# Patient Record
Sex: Female | Born: 1965 | Race: Black or African American | Hispanic: No | Smoking: Never smoker
Health system: Southern US, Community
[De-identification: ages and names within clinical notes are randomized; demographics above are authoritative.]

## PROBLEM LIST (undated history)

## (undated) DIAGNOSIS — I471 Supraventricular tachycardia, unspecified: Secondary | ICD-10-CM

## (undated) DIAGNOSIS — I10 Essential (primary) hypertension: Secondary | ICD-10-CM

## (undated) DIAGNOSIS — I639 Cerebral infarction, unspecified: Secondary | ICD-10-CM

## (undated) DIAGNOSIS — N186 End stage renal disease: Secondary | ICD-10-CM

## (undated) DIAGNOSIS — N189 Chronic kidney disease, unspecified: Secondary | ICD-10-CM

---

## 1998-11-06 ENCOUNTER — Emergency Department (HOSPITAL_COMMUNITY): Admission: EM | Admit: 1998-11-06 | Discharge: 1998-11-06 | Payer: Self-pay | Admitting: Emergency Medicine

## 1998-11-06 ENCOUNTER — Encounter: Payer: Self-pay | Admitting: Emergency Medicine

## 2012-09-22 ENCOUNTER — Observation Stay (HOSPITAL_COMMUNITY)
Admission: EM | Admit: 2012-09-22 | Discharge: 2012-09-23 | Disposition: A | Discharge status: Home / Self Care | Payer: No Typology Code available for payment source | Attending: Internal Medicine | Admitting: Internal Medicine

## 2012-09-22 ENCOUNTER — Encounter (HOSPITAL_COMMUNITY): Payer: Self-pay | Admitting: *Deleted

## 2012-09-22 ENCOUNTER — Emergency Department (HOSPITAL_COMMUNITY): Payer: No Typology Code available for payment source

## 2012-09-22 DIAGNOSIS — Z79899 Other long term (current) drug therapy: Secondary | ICD-10-CM | POA: Insufficient documentation

## 2012-09-22 DIAGNOSIS — R42 Dizziness and giddiness: Secondary | ICD-10-CM | POA: Insufficient documentation

## 2012-09-22 DIAGNOSIS — I160 Hypertensive urgency: Secondary | ICD-10-CM | POA: Diagnosis present

## 2012-09-22 DIAGNOSIS — F411 Generalized anxiety disorder: Secondary | ICD-10-CM | POA: Insufficient documentation

## 2012-09-22 DIAGNOSIS — R51 Headache: Secondary | ICD-10-CM | POA: Insufficient documentation

## 2012-09-22 DIAGNOSIS — F419 Anxiety disorder, unspecified: Secondary | ICD-10-CM

## 2012-09-22 DIAGNOSIS — I10 Essential (primary) hypertension: Principal | ICD-10-CM | POA: Insufficient documentation

## 2012-09-22 HISTORY — DX: Essential (primary) hypertension: I10

## 2012-09-22 LAB — POCT I-STAT, CHEM 8
BUN: 17 mg/dL (ref 6–23)
Calcium, Ion: 1.16 mmol/L (ref 1.12–1.23)
Chloride: 105 mEq/L (ref 96–112)
Creatinine, Ser: 1 mg/dL (ref 0.50–1.10)
Glucose, Bld: 113 mg/dL — ABNORMAL HIGH (ref 70–99)
HCT: 38 % (ref 36.0–46.0)
Hemoglobin: 12.9 g/dL (ref 12.0–15.0)
Potassium: 3.5 mEq/L (ref 3.5–5.1)
Sodium: 142 mEq/L (ref 135–145)
TCO2: 27 mmol/L (ref 0–100)

## 2012-09-22 MED ORDER — SODIUM CHLORIDE 0.9 % IJ SOLN
3.0000 mL | Freq: Two times a day (BID) | INTRAMUSCULAR | Status: DC
  Administered 2012-09-23: 3 mL via INTRAVENOUS

## 2012-09-22 MED ORDER — LABETALOL HCL 5 MG/ML IV SOLN
20.0000 mg | Freq: Once | INTRAVENOUS | Status: AC
  Administered 2012-09-22: 20 mg via INTRAVENOUS
  Filled 2012-09-22: qty 4

## 2012-09-22 MED ORDER — AMLODIPINE BESYLATE 5 MG PO TABS
5.0000 mg | ORAL_TABLET | Freq: Every day | ORAL | Status: AC
  Administered 2012-09-22: 5 mg via ORAL
  Filled 2012-09-22: qty 1

## 2012-09-22 MED ORDER — ACETAMINOPHEN 325 MG PO TABS
650.0000 mg | ORAL_TABLET | Freq: Four times a day (QID) | ORAL | Status: DC | PRN
  Administered 2012-09-22: 650 mg via ORAL
  Filled 2012-09-22: qty 2

## 2012-09-22 MED ORDER — SODIUM CHLORIDE 0.9 % IV SOLN: 250.0000 mL | INTRAVENOUS | Status: DC | PRN

## 2012-09-22 MED ORDER — CLONIDINE HCL 0.1 MG PO TABS: 0.1000 mg | ORAL_TABLET | Freq: Once | ORAL | Status: DC

## 2012-09-22 MED ORDER — IRBESARTAN 150 MG PO TABS
150.0000 mg | ORAL_TABLET | Freq: Every day | ORAL | Status: DC
  Administered 2012-09-23: 150 mg via ORAL
  Filled 2012-09-22: qty 1

## 2012-09-22 MED ORDER — HYDRALAZINE HCL 20 MG/ML IJ SOLN
10.0000 mg | Freq: Four times a day (QID) | INTRAMUSCULAR | Status: DC | PRN
  Administered 2012-09-22: 10 mg via INTRAVENOUS
  Filled 2012-09-22 (×2): qty 1

## 2012-09-22 MED ORDER — ONDANSETRON HCL 4 MG PO TABS: 4.0000 mg | ORAL_TABLET | Freq: Four times a day (QID) | ORAL | Status: DC | PRN

## 2012-09-22 MED ORDER — ACETAMINOPHEN 650 MG RE SUPP: 650.0000 mg | Freq: Four times a day (QID) | RECTAL | Status: DC | PRN

## 2012-09-22 MED ORDER — ALPRAZOLAM 0.25 MG PO TABS
0.2500 mg | ORAL_TABLET | Freq: Three times a day (TID) | ORAL | Status: DC | PRN
  Administered 2012-09-23: 0.25 mg via ORAL
  Filled 2012-09-22 (×2): qty 1

## 2012-09-22 MED ORDER — HYDROCHLOROTHIAZIDE 12.5 MG PO CAPS
12.5000 mg | ORAL_CAPSULE | Freq: Every day | ORAL | Status: DC
  Administered 2012-09-22 – 2012-09-23 (×2): 12.5 mg via ORAL
  Filled 2012-09-22 (×2): qty 1

## 2012-09-22 MED ORDER — CLONIDINE HCL 0.1 MG PO TABS
0.2000 mg | ORAL_TABLET | Freq: Once | ORAL | Status: DC
  Filled 2012-09-22: qty 2

## 2012-09-22 MED ORDER — SODIUM CHLORIDE 0.9 % IJ SOLN: 3.0000 mL | INTRAMUSCULAR | Status: DC | PRN

## 2012-09-22 MED ORDER — SODIUM CHLORIDE 0.9 % IJ SOLN
3.0000 mL | Freq: Two times a day (BID) | INTRAMUSCULAR | Status: DC
  Administered 2012-09-22: 3 mL via INTRAVENOUS

## 2012-09-22 MED ORDER — LABETALOL HCL 5 MG/ML IV SOLN
20.0000 mg | Freq: Once | INTRAVENOUS | Status: AC
Start: 2012-09-22 — End: 2012-09-22
  Administered 2012-09-22: 20 mg via INTRAVENOUS
  Filled 2012-09-22: qty 4

## 2012-09-22 MED ORDER — AMLODIPINE BESYLATE 10 MG PO TABS
10.0000 mg | ORAL_TABLET | Freq: Every day | ORAL | Status: DC
  Administered 2012-09-23: 10 mg via ORAL
  Filled 2012-09-22: qty 1

## 2012-09-22 MED ORDER — ONDANSETRON HCL 4 MG/2ML IJ SOLN: 4.0000 mg | Freq: Four times a day (QID) | INTRAMUSCULAR | Status: DC | PRN

## 2012-09-22 MED ORDER — SODIUM CHLORIDE 0.9 % IV SOLN
Freq: Once | INTRAVENOUS | Status: AC
  Administered 2012-09-22: 20 mL/h via INTRAVENOUS

## 2012-09-22 NOTE — Progress Notes (Signed)
   CARE MANAGEMENT ED NOTE 09/22/2012  Patient:  ANCHAL, MAKINO   Account Number:  [id number]  Date Initiated:  09/22/2012  Documentation initiated by:  Livia Snellen  Subjective/Objective Assessment:     Subjective/Objective Assessment Detail:     Action/Plan:   Action/Plan Detail:   Anticipated DC Date:       Status Recommendation to Physician:   Result of Recommendation:    Other ED [place]  Other  PCP issues    Choice offered to / List presented to:            Status of service:  Completed, signed off  ED Comments:   ED Comments Detail:  Patient listed as not having a pcp.  EDCM spoke to patient who stated that Dr. Lelon Frohlich PA at [facility] is her primary doctor.  Offered support to patient.  No further needs at this time.

## 2012-09-22 NOTE — ED Provider Notes (Signed)
Erin Dean is a 47 y.o. female who presents for evaluation of hypertension. She was started on a new agent for blood pressure, 5 days ago. She has taken it daily. Prior to that she was off medication for blood pressure for "a long time." She had previously been on Benicar. She had some dizziness last week, it has not recurred. Today, she noted "black spots in front of my eyes". She denies headache, weakness, dizziness, nausea, vomiting, chest pain, shortness of breath, change in bowel or urinary habits.  Exam alert, calm, cooperative. Neurologic exam grossly nonfocal. Respiratory normal effort.  Vital signs reviewed are normal with the exception of blood pressure elevated to 221/94.  Medications  labetalol (NORMODYNE,TRANDATE) injection 20 mg (not administered)    Patient Vitals for the past 24 hrs:  BP Temp Temp src Pulse Resp SpO2 Height  09/22/12 1134 220/90 mmHg - - - - - -  09/22/12 1131 221/94 mmHg 98.6 F (37 C) Oral 86 11 100 % 5\' 7"  (1.702 m)   Results for orders placed during the hospital encounter of 09/22/12  POCT I-STAT, CHEM 8      Result Value Range   Sodium 142  135 - 145 mEq/L   Potassium 3.5  3.5 - 5.1 mEq/L   Chloride 105  96 - 112 mEq/L   BUN 17  6 - 23 mg/dL   Creatinine, Ser 1.00  0.50 - 1.10 mg/dL   Glucose, Bld 113 (*) 70 - 99 mg/dL   Calcium, Ion 1.16  1.12 - 1.23 mmol/L   TCO2 27  0 - 100 mmol/L   Hemoglobin 12.9  12.0 - 15.0 g/dL   HCT 38.0  36.0 - 46.0 %     CRITICAL CARE Performed by: Daleen Bo L Total critical care time: 35 minutes. Treatment for Hypertensive urgency with IV Beta-blocker Critical care time was exclusive of separately billable procedures and treating other patients. Critical care was necessary to treat or prevent imminent or life-threatening deterioration. Critical care was time spent personally by me on the following activities: development of treatment plan with patient and/or surrogate as well as nursing, discussions with  consultants, evaluation of patient's response to treatment, examination of patient, obtaining history from patient or surrogate, ordering and performing treatments and interventions, ordering and review of laboratory studies, ordering and review of radiographic studies, pulse oximetry and re-evaluation of patient's condition.   Assessment- hypertension with nonspecific visual changes. Evaluation for hypertensive urgency in the ED, done to evaluate for an organ damage. Treatment with IV beta blocker for control of blood pressure.  5:10 PM-Consult complete with Dr. Wendee Beavers. Patient case explained and discussed. He agrees to admit patient for further evaluation and treatment. Call ended at 17:30   Medical screening examination/treatment/procedure(s) were conducted as a shared visit with non-physician practitioner(s) and myself.  I personally evaluated the patient during the encounter  Richarda Blade, MD 09/22/12 2055

## 2012-09-22 NOTE — ED Provider Notes (Signed)
History     CSN: [id number]  Arrival date & time 09/22/12  1114   First MD Initiated Contact with Patient 09/22/12 1140      Chief Complaint  Patient presents with  . Hypertension  . Dizziness    (Consider location/radiation/quality/duration/timing/severity/associated sxs/prior treatment) Patient is a 47 y.o. female presenting with hypertension.  Hypertension Pertinent negatives include no chest pain, chills, fever, headaches, nausea, numbness, vomiting or weakness.   Pt is a 47yo female with hx of HTN presenting today with elevated BP.  Pt was seen by  Lennon Alstrom PA-C, PCP, last Thursday 6/12 for dizziness and was found to have elevated BP, lower 200s/100s. Pt was given clonidine in the office and prescribed Tribenzor.  She reported to PCP today for BP recheck and found to still have elevated BP and was advised to come to ED.  Pt states she feels dizzy on occasion, associated with "black floaters," last time was earlier this morning when speaking with her boss.  States dizziness feels like room is spinning and left ear is "clogged", it last a few hours last Wednesday but only a few minutes today.  Reports also being given xanax but states she has not tried that medication yet.  Pt does report having been dx with HTN in the past however has not been on medication for a while due to lack of insurance. Denies dizziness at this time.  Denies chest pain, SOB, abdominal pain, n/v/d.  Denies hematuria or dysuria.   Past Medical History  Diagnosis Date  . Hypertension     Past Surgical History  Procedure Laterality Date  . Cesarean section      History reviewed. No pertinent family history.  History  Substance Use Topics  . Smoking status: Never Smoker   . Smokeless tobacco: Never Used  . Alcohol Use: No    OB History   Grav Para Term Preterm Abortions TAB SAB Ect Mult Living                  Review of Systems  Constitutional: Negative for fever and chills.  Eyes:  Negative for photophobia and visual disturbance.  Respiratory: Negative for shortness of breath.   Cardiovascular: Negative for chest pain.  Gastrointestinal: Negative for nausea and vomiting.  Genitourinary: Negative for hematuria.  Neurological: Positive for dizziness. Negative for tremors, seizures, syncope, weakness, numbness and headaches.  All other systems reviewed and are negative.    Allergies  Review of patient's allergies indicates no known allergies.  Home Medications   No current outpatient prescriptions on file.  BP 142/84  Pulse 80  Temp(Src) 97.8 F (36.6 C) (Oral)  Resp 16  Ht 5\' 7"  (1.702 m)  Wt 253 lb (114.76 kg)  BMI 39.62 kg/m2  SpO2 97%  LMP 09/15/2012  Physical Exam  Nursing note and vitals reviewed. Constitutional: She appears well-developed and well-nourished. No distress.  Pt appears well, NAD.  HENT:  Head: Normocephalic and atraumatic.  Eyes: Conjunctivae are normal. No scleral icterus.  Neck: Normal range of motion. Neck supple.  Cardiovascular: Normal rate, regular rhythm and normal heart sounds.   Pulmonary/Chest: Effort normal and breath sounds normal. No respiratory distress. She has no wheezes. She has no rales. She exhibits no tenderness.  Abdominal: Soft. Bowel sounds are normal. She exhibits no distension and no mass. There is no tenderness. There is no rebound and no guarding.  Musculoskeletal: Normal range of motion.  Neurological: She is alert.  Skin: Skin is warm and  dry. She is not diaphoretic.    ED Course  Procedures (including critical care time)  Labs Reviewed  BASIC METABOLIC PANEL - Abnormal; Notable for the following:    Glucose, Bld 109 (*)    Creatinine, Ser 1.12 (*)    GFR calc non Af Amer 58 (*)    GFR calc Af Amer 67 (*)    All other components within normal limits  CBC - Abnormal; Notable for the following:    Hemoglobin 9.6 (*)    HCT 31.3 (*)    MCV 76.5 (*)    MCH 23.5 (*)    RDW 16.2 (*)    All  other components within normal limits  POCT I-STAT, CHEM 8 - Abnormal; Notable for the following:    Glucose, Bld 113 (*)    All other components within normal limits   Ct Head Wo Contrast  09/22/2012   *RADIOLOGY REPORT*  Clinical Data: Hypertension, dizziness, severe headache  CT HEAD WITHOUT CONTRAST  Technique:  Contiguous axial images were obtained from the base of the skull through the vertex without contrast.  Comparison: None.  Findings:  Scattered minimal grossly symmetric bilateral basal ganglial calcifications.  Gray white differentiation is maintained. No CT evidence of acute large territory infarct.  No intraparenchymal or extra-axial mass or hemorrhage.  Normal size and configuration of the ventricles and basilar cisterns.  No midline shift.  Limited visualization of paranasal sinuses and mastoid air cells are normally aerated.  No air fluid levels.  Regional soft tissues and osseous structures are normal.  IMPRESSION: Negative noncontrast head CT.   Original Report Authenticated By: Jake Seats, MD      1. Dizziness   2. Hypertensive urgency   3. Anxiety       MDM  Pt appears well. Was sent to ED by PCP after failed outpatient tx of HTN.  Was able to track down medical records of EKG, CXR, CBC, CMP, TSH, and UA: all which were unremarkable.  Will obtain head CT here due to elevated BP and "black spots" in patients vision when dizziness occurs.  Tx with labetalol at this time.    Records from [place] from 09/18/12 EKG: NSR, no arrhythmias, no STEMI CBC: Hgb-10.2, Hct-32.2, MCH-24.1, MCV-76.6, MCHC-31.5, RDW-17.0,  CMP: WNL- BUN-17, Cr-1.22, GFR-77, ALT-8, AST-18 UA: elevated protein, >300, trace ketones TSH: WNL-2.08   Chem 8: unremarkable Head CT: negative  Labetalol: pressure was lowered to 187/89.  Ambulated pt and rechecked pressure-216/110, gave another dose of labetalol.  Pt denies feeling dizzy or weak but stated she was upset that her BP keeps going up.     Consulted Dr. Eulis Foster of continued elevated BP.  Pt is to be admitted for better control of BP.   Noland Fordyce, PA-C 09/23/12 1422

## 2012-09-22 NOTE — H&P (Signed)
[facility] History and Physical  Erin Dean DOB: 1966/02/10 DOA: 09/22/2012  Referring physician: Dr. Eulis Foster PCP: No primary provider on file.  Specialists: none  Chief Complaint: Dots in visual field with elevated BP  HPI: Erin Dean is a 47 y.o. female  With history of HTN recently started on tribenzor (olmesartan-amlodipine-hctz) by her pcp.  Reportedly had not had insurance and had gone 3 years without treatment of her HTN.  States that she hasn't missed her blood pressure medication dose since it was last started on 09/18/12.  But today she noticed dots in her visual field and decided to come to the ED for further evaluations.    While in the ED was found to have elevated BP's and was started on IV antihypertensive medication.  Dots in visual field resolved and CT of head was negative but blood pressure remained elevated. We were consulted for admission evaluation and recommendations for HTN urgency.  Pt denied any pain anywhere.  Review of Systems: 10 point review of system reviewed and negative unless otherwise mentioned above.   Past Medical History  Diagnosis Date  . Hypertension    Past Surgical History  Procedure Laterality Date  . Cesarean section     Social History:  reports that she has never smoked. She has never used smokeless tobacco. She reports that she does not drink alcohol or use illicit drugs.  where does patient live--home, ALF, SNF? and with whom if at home? Lives at home  Can patient participate in ADLs? yes  No Known Allergies  History reviewed. No pertinent family history. Father had liver cancer  Prior to Admission medications   Medication Sig Start Date End Date Taking? Authorizing Provider  ALPRAZolam Duanne Moron) 0.5 MG tablet Take 0.25-0.5 mg by mouth 3 (three) times daily as needed (anxiety). Pt has not taken yet   Yes Historical Provider, MD  Olmesartan-Amlodipine-HCTZ (TRIBENZOR) 20-5-12.5 MG TABS Take 1 tablet by  mouth.   Yes Historical Provider, MD   Physical Exam: Filed Vitals:   09/22/12 1630 09/22/12 1700 09/22/12 1715 09/22/12 1730  BP: 196/93 198/90  208/88  Pulse: 58 58 63 56  Temp:      TempSrc:      Resp:      Height:      SpO2: 99% 98% 98% 98%     General:  Pt in NAD, Alert and awake  Eyes: EOMI, non icteric  ENT: normal exterior appearance, MMM  Neck: supple, no goiter  Cardiovascular: RRR, no MRG  Respiratory: CTA BL, no wheezes  Abdomen: soft, NT, ND  Skin: warm and dry  Musculoskeletal: no cyanosis or clubbing  Psychiatric: mood and affect appropriate  Neurologic: answers questions appropriately and moves all extremities.  Labs on Admission:  Basic Metabolic Panel:  Recent Labs Lab 09/22/12 1212  NA 142  K 3.5  CL 105  GLUCOSE 113*  BUN 17  CREATININE 1.00   Liver Function Tests: No results found for this basename: AST, ALT, ALKPHOS, BILITOT, PROT, ALBUMIN,  in the last 168 hours No results found for this basename: LIPASE, AMYLASE,  in the last 168 hours No results found for this basename: AMMONIA,  in the last 168 hours CBC:  Recent Labs Lab 09/22/12 1212  HGB 12.9  HCT 38.0   Cardiac Enzymes: No results found for this basename: CKTOTAL, CKMB, CKMBINDEX, TROPONINI,  in the last 168 hours  BNP (last 3 results) No results found for this basename: PROBNP,  in the  last 8760 hours CBG: No results found for this basename: GLUCAP,  in the last 168 hours  Radiological Exams on Admission: Ct Head Wo Contrast  09/22/2012   *RADIOLOGY REPORT*  Clinical Data: Hypertension, dizziness, severe headache  CT HEAD WITHOUT CONTRAST  Technique:  Contiguous axial images were obtained from the base of the skull through the vertex without contrast.  Comparison: None.  Findings:  Scattered minimal grossly symmetric bilateral basal ganglial calcifications.  Gray white differentiation is maintained. No CT evidence of acute large territory infarct.  No  intraparenchymal or extra-axial mass or hemorrhage.  Normal size and configuration of the ventricles and basilar cisterns.  No midline shift.  Limited visualization of paranasal sinuses and mastoid air cells are normally aerated.  No air fluid levels.  Regional soft tissues and osseous structures are normal.  IMPRESSION: Negative noncontrast head CT.   Original Report Authenticated By: Jake Seats, MD    EKG: Independently reviewed. None obtained in ED. Order placed during admission evaluation.  Assessment/Plan Active Problems:   1. Hypertension Urgency - CT of head negative - Will place back on home regimen and start Amlodipine at 10 mg instead of 5.  Will give her the extra 5 mg tonight and next am start her at 10 mg of amlodipine orally daily. - Will continue hctz today and start ARB next am - hydralazine 10 mg IV prn q 6 hours for SBP > 180 or DBP > 110 - Obtain EKG - Monitor in telemetry  2. Anxiety - continue home regimen benzodiazepine   Code Status: full Family Communication: discussed with family at bedside.  Disposition Plan: Pending improvement in blood pressures  Time spent: > 60 minutes  Velvet Bathe [facility] Pager [phone]  If 7PM-7AM, please contact night-coverage [email or web address] Password [facility] 09/22/2012, 5:48 PM

## 2012-09-23 DIAGNOSIS — R42 Dizziness and giddiness: Secondary | ICD-10-CM

## 2012-09-23 LAB — BASIC METABOLIC PANEL
CO2: 28 mEq/L (ref 19–32)
Calcium: 9.6 mg/dL (ref 8.4–10.5)
Chloride: 101 mEq/L (ref 96–112)
Creatinine, Ser: 1.12 mg/dL — ABNORMAL HIGH (ref 0.50–1.10)
Glucose, Bld: 109 mg/dL — ABNORMAL HIGH (ref 70–99)
Sodium: 137 mEq/L (ref 135–145)

## 2012-09-23 LAB — CBC
HCT: 31.3 % — ABNORMAL LOW (ref 36.0–46.0)
MCH: 23.5 pg — ABNORMAL LOW (ref 26.0–34.0)
MCV: 76.5 fL — ABNORMAL LOW (ref 78.0–100.0)
Platelets: 303 10*3/uL (ref 150–400)
RBC: 4.09 MIL/uL (ref 3.87–5.11)

## 2012-09-23 MED ORDER — HYDRALAZINE HCL 20 MG/ML IJ SOLN
10.0000 mg | Freq: Once | INTRAMUSCULAR | Status: AC
  Administered 2012-09-23: 10 mg via INTRAVENOUS

## 2012-09-23 MED ORDER — UNABLE TO FIND: Status: DC

## 2012-09-23 MED ORDER — TELMISARTAN 40 MG PO TABS: 40.0000 mg | ORAL_TABLET | Freq: Every day | ORAL | Status: DC

## 2012-09-23 MED ORDER — HYDROCHLOROTHIAZIDE 12.5 MG PO CAPS: 12.5000 mg | ORAL_CAPSULE | Freq: Every day | ORAL | Status: DC

## 2012-09-23 MED ORDER — METOPROLOL TARTRATE 25 MG PO TABS
25.0000 mg | ORAL_TABLET | Freq: Two times a day (BID) | ORAL | Status: DC
  Administered 2012-09-23: 25 mg via ORAL
  Filled 2012-09-23 (×2): qty 1

## 2012-09-23 MED ORDER — HYDRALAZINE HCL 20 MG/ML IJ SOLN: 10.0000 mg | Freq: Once | INTRAMUSCULAR | Status: DC

## 2012-09-23 MED ORDER — METOPROLOL TARTRATE 25 MG PO TABS: 25.0000 mg | ORAL_TABLET | Freq: Two times a day (BID) | ORAL | Status: DC

## 2012-09-23 MED ORDER — AMLODIPINE BESYLATE 10 MG PO TABS: 10.0000 mg | ORAL_TABLET | Freq: Every day | ORAL | Status: DC

## 2012-09-23 NOTE — Discharge Summary (Signed)
Physician Discharge Summary  Erin Dean [id number] DOB: 1966/02/20 DOA: 09/22/2012  PCP: No primary provider on file.  Admit date: 09/22/2012 Discharge date: 09/23/2012  Recommendations for Outpatient Follow-up:  1. Pt will need to follow up with PCP in 2 weeks post discharge 2. Please obtain BMP to evaluate electrolytes and kidney function 3. Please also check CBC to evaluate Hg and Hct levels   Discharge Diagnoses:  Active Problems:   Hypertensive urgency   Anxiety Hypertensive urgency -The time of admission, the patient's blood pressure was up to 221/94 -The patient was given IV labetalol -She was restarted back an ARB, HCTZ -Her amlodipine dose was increased to 10 mg daily -Her EKG shows sinus rhythm with suggestion of LVH -Metoprolol tartrate was started 25 mg twice a day -CT of the brain was negative -Patient remained sinus rhythm throughout the hospitalization -Patient's blood pressure improved to 158/88 on the day of discharge -She will need followup with her primary care physician with titration of her antihypertensive medications -The patient did not have any focal neurologic deficits or visual disturbance -The patient was offered further workup regarding urinalysis, further brain imaging, and workup of her anemia however, the patient was anxious to go home. -As the patient was clinically stable, the patient was discharged to go home with adjustment of her antihypertensive regimen and close followup with her primary care physician  Discharge Condition: stable  Disposition:   Diet: cardiac Wt Readings from Last 3 Encounters:  09/22/12 114.76 kg (253 lb)    History of present illness:  47 year old female With history of HTN recently started on tribenzor (olmesartan-amlodipine-hctz) by her pcp. Reportedly had not had insurance and had gone 3 years without treatment of her HTN. States that she hasn't missed her blood pressure medication dose since it was last  started on 09/18/12. But on the day of admission, she noticed dots in her visual field and decided to come to the ED for further evaluations.  While in the ED was found to have elevated BP's and was started on IV antihypertensive medication--labetalol. Dots in visual field resolved and CT of head was negative but blood pressure remained elevated. We were consulted for admission evaluation and recommendations for HTN urgency. Pt denied any pain anywhere. She denied any other focal neurologic deficit.     Discharge Exam: Filed Vitals:   09/23/12 1600  BP: 160/64  Pulse: 96  Temp:   Resp:    Filed Vitals:   09/23/12 1150 09/23/12 1340 09/23/12 1436 09/23/12 1600  BP: 142/84  190/120 160/64  Pulse: 84 80  96  Temp:  97.8 F (36.6 C)    TempSrc:  Oral    Resp:  16    Height:      Weight:      SpO2:  97%     General: A&O x 3, NAD, pleasant, cooperative Cardiovascular: RRR, no rub, no gallop, no S3 Respiratory: CTAB, no wheeze, no rhonchi Abdomen:soft, nontender, nondistended, positive bowel sounds Extremities: No edema, No lymphangitis, no petechiae  Discharge Instructions     Medication List    STOP taking these medications       TRIBENZOR 20-5-12.5 MG Tabs  Generic drug:  Olmesartan-Amlodipine-HCTZ      TAKE these medications       ALPRAZolam 0.5 MG tablet  Commonly known as:  XANAX  Take 0.25-0.5 mg by mouth 3 (three) times daily as needed (anxiety). Pt has not taken yet     amLODipine 10 MG tablet  Commonly known as:  NORVASC  Take 1 tablet (10 mg total) by mouth daily.     hydrochlorothiazide 12.5 MG capsule  Commonly known as:  MICROZIDE  Take 1 capsule (12.5 mg total) by mouth daily.     metoprolol tartrate 25 MG tablet  Commonly known as:  LOPRESSOR  Take 1 tablet (25 mg total) by mouth 2 (two) times daily.     telmisartan 40 MG tablet  Commonly known as:  MICARDIS  Take 1 tablet (40 mg total) by mouth daily.         The results of significant  diagnostics from this hospitalization (including imaging, microbiology, ancillary and laboratory) are listed below for reference.    Significant Diagnostic Studies: Ct Head Wo Contrast  09/22/2012   *RADIOLOGY REPORT*  Clinical Data: Hypertension, dizziness, severe headache  CT HEAD WITHOUT CONTRAST  Technique:  Contiguous axial images were obtained from the base of the skull through the vertex without contrast.  Comparison: None.  Findings:  Scattered minimal grossly symmetric bilateral basal ganglial calcifications.  Gray white differentiation is maintained. No CT evidence of acute large territory infarct.  No intraparenchymal or extra-axial mass or hemorrhage.  Normal size and configuration of the ventricles and basilar cisterns.  No midline shift.  Limited visualization of paranasal sinuses and mastoid air cells are normally aerated.  No air fluid levels.  Regional soft tissues and osseous structures are normal.  IMPRESSION: Negative noncontrast head CT.   Original Report Authenticated By: Jake Seats, MD     Microbiology: No results found for this or any previous visit (from the past 240 hour(s)).   Labs: Basic Metabolic Panel:  Recent Labs Lab 09/22/12 1212 09/23/12 0550  NA 142 137  K 3.5 3.7  CL 105 101  CO2  --  28  GLUCOSE 113* 109*  BUN 17 17  CREATININE 1.00 1.12*  CALCIUM  --  9.6   Liver Function Tests: No results found for this basename: AST, ALT, ALKPHOS, BILITOT, PROT, ALBUMIN,  in the last 168 hours No results found for this basename: LIPASE, AMYLASE,  in the last 168 hours No results found for this basename: AMMONIA,  in the last 168 hours CBC:  Recent Labs Lab 09/22/12 1212 09/23/12 0550  WBC  --  8.5  HGB 12.9 9.6*  HCT 38.0 31.3*  MCV  --  76.5*  PLT  --  303   Cardiac Enzymes: No results found for this basename: CKTOTAL, CKMB, CKMBINDEX, TROPONINI,  in the last 168 hours BNP: No components found with this basename: POCBNP,  CBG: No results found  for this basename: GLUCAP,  in the last 168 hours  Time coordinating discharge:  Greater than 30 minutes  Signed:  Enslie Sahota, DO [facility] Pager: [phone] 09/23/2012, 5:20 PM

## 2012-09-24 NOTE — ED Provider Notes (Signed)
See my note for this visit.   Medical screening examination/treatment/procedure(s) were conducted as a shared visit with non-physician practitioner(s) and myself.  I personally evaluated the patient during the encounter  Richarda Blade, MD 09/24/12 1009

## 2015-10-13 ENCOUNTER — Other Ambulatory Visit: Payer: Self-pay | Admitting: Family Medicine

## 2015-10-13 DIAGNOSIS — I10 Essential (primary) hypertension: Secondary | ICD-10-CM

## 2015-10-21 ENCOUNTER — Other Ambulatory Visit: Payer: No Typology Code available for payment source

## 2015-11-01 ENCOUNTER — Ambulatory Visit
Admission: RE | Admit: 2015-11-01 | Discharge: 2015-11-01 | Disposition: A | Discharge status: Home / Self Care | Payer: BLUE CROSS/BLUE SHIELD | Source: Ambulatory Visit | Attending: Family Medicine | Admitting: Family Medicine

## 2015-11-01 DIAGNOSIS — I10 Essential (primary) hypertension: Secondary | ICD-10-CM

## 2015-12-01 ENCOUNTER — Ambulatory Visit (INDEPENDENT_AMBULATORY_CARE_PROVIDER_SITE_OTHER): Payer: BLUE CROSS/BLUE SHIELD | Admitting: Interventional Cardiology

## 2015-12-01 ENCOUNTER — Encounter: Payer: Self-pay | Admitting: Interventional Cardiology

## 2015-12-01 ENCOUNTER — Encounter (INDEPENDENT_AMBULATORY_CARE_PROVIDER_SITE_OTHER): Payer: Self-pay

## 2015-12-01 VITALS — BP 220/100 | HR 49 | Ht 67.0 in | Wt 246.0 lb

## 2015-12-01 DIAGNOSIS — E669 Obesity, unspecified: Secondary | ICD-10-CM | POA: Insufficient documentation

## 2015-12-01 DIAGNOSIS — R600 Localized edema: Secondary | ICD-10-CM | POA: Diagnosis not present

## 2015-12-01 DIAGNOSIS — I10 Essential (primary) hypertension: Secondary | ICD-10-CM

## 2015-12-01 DIAGNOSIS — R011 Cardiac murmur, unspecified: Secondary | ICD-10-CM

## 2015-12-01 MED ORDER — AMLODIPINE BESYLATE 10 MG PO TABS: 10.0000 mg | ORAL_TABLET | Freq: Every day | ORAL | 3 refills | Status: DC

## 2015-12-01 NOTE — Progress Notes (Signed)
Cardiology Office Note   Date:  12/01/2015   ID:  Erin Dean, DOB Apr 06, 1966, MRN [id number]  PCP:  Lois Huxley, PA    No chief complaint on file.  HTN  Wt Readings from Last 3 Encounters:  12/01/15 246 lb (111.6 kg)  09/22/12 253 lb (114.8 kg)       History of Present Illness: Erin Dean is a 50 y.o. female  Who has had HTN for at least 3 years.  She took medicines for a while but then stopped them for a year.  Her BP was quite high and she had three meds added.  She has anxiety as well.     BP is higher when she has more stress.  Sometimes, it is lower at home.  She had run out of medicines and did not take them Tuesday.   She is not a smoker.  Heart disease runs on the mother's side of the family.  No siblings with heart trouble.   She has made lifestyle changes including dietary changes and increased exercise to try to lose weight. She lost 20 lbs.  She does report some lower extremity swelling. This is worse if she is on her feet for long periods of time.  She has not tried compression stockings.  She was told she had a murmur.    Past Medical History:  Diagnosis Date  . Hypertension     Past Surgical History:  Procedure Laterality Date  . CESAREAN SECTION       Current Outpatient Prescriptions  Medication Sig Dispense Refill  . ALPRAZolam (XANAX) 0.5 MG tablet Take 0.25-0.5 mg by mouth 3 (three) times daily as needed (anxiety). Pt has not taken yet    . amLODipine (NORVASC) 5 MG tablet Take 5 mg by mouth daily.    Marland Kitchen escitalopram (LEXAPRO) 10 MG tablet Take 10 mg by mouth daily.    . ferrous sulfate 325 (65 FE) MG tablet Take 325 mg by mouth 2 (two) times daily with a meal.     . Nebivolol HCl (BYSTOLIC) 20 MG TABS Take 1 tablet by mouth daily.    . valsartan-hydrochlorothiazide (DIOVAN-HCT) 320-25 MG tablet Take 1 tablet by mouth daily.     No current facility-administered medications for this visit.     Allergies:   Review of patient's  allergies indicates no known allergies.    Social History:  The patient  reports that she has never smoked. She has never used smokeless tobacco. She reports that she does not drink alcohol or use drugs.   Family History:  The patient's family history includes Heart attack in her maternal grandfather and maternal uncle; Stroke in her paternal grandmother.    ROS:  Please see the history of present illness.   Otherwise, review of systems are positive for intentional weight loss.   All other systems are reviewed and negative.    PHYSICAL EXAM: VS:  BP (!) 220/100 (BP Location: Right Arm, Patient Position: Sitting, Cuff Size: Large)   Pulse (!) 49   Ht 5\' 7"  (1.702 m)   Wt 246 lb (111.6 kg)   BMI 38.53 kg/m  , BMI Body mass index is 38.53 kg/m. GEN: Well nourished, well developed, in no acute distress  HEENT: normal  Neck: no JVD, carotid bruits, or masses Cardiac: RRR; 2/6 systolic murmur, no rubs, or gallops,no edema  Respiratory:  clear to auscultation bilaterally, normal work of breathing GI: soft, nontender, nondistended, + BS MS: no  deformity or atrophy  Skin: warm and dry, no rash Neuro:  Strength and sensation are intact Psych: euthymic mood, full affect   EKG:   The ekg ordered today demonstrates sinus bradycardia, no ST segment changes   Recent Labs: No results found for requested labs within last 8760 hours.   Lipid Panel No results found for: CHOL, TRIG, HDL, CHOLHDL, VLDL, LDLCALC, LDLDIRECT   Other studies Reviewed: Additional studies/ records that were reviewed today with results demonstrating: Notes from primary care physician.   ASSESSMENT AND PLAN:  1. HTN: Difficult to control hypertension despite multiple medications. Will increase amlodipine to 10 mg daily. She will follow-up in the %PharmD hypertension clinic. Her bystolic is expensive.  Perhaps, this could be changed to high-dose carvedilol for cost savings at her visit with the Pharm.D.  Blood  pressures typically better controlled at home compared to the doctor's office.  Today her blood pressure was in the [id number] systolic at home.  Typically 150-170 range. Check BP cuff ather visit with the PharmD. 2. Obesity: She has lost 20 pounds. I congratulated her on this. Continue the lifestyle changes that it helped her lose weight including stopping sodas and avoiding fried foods. This will certainly help with her blood pressure. 3. Compliance: She has had issues with running out of her medicines at times. Once she is on a stable regimen, we can try a 90 day prescriptions. 4. Heart murmur: Systolic murmur. Check echocardiogram to evaluate for structural heart disease. 5. Lower extremity edema: Bilateral. May get worse with increased amlodipine. However, I think is more likely related to venous insufficiency and her being on her feet. Try compression stockings. She has been meaning to do this for some time.   Current medicines are reviewed at length with the patient today.  The patient concerns regarding her medicines were addressed.  The following changes have been made:  Increase amlodipine  Labs/ tests ordered today include:  No orders of the defined types were placed in this encounter.   Recommend 150 minutes/week of aerobic exercise Low fat, low carb, high fiber diet recommended  Disposition:   FU in 2 weeks with Pharm.D.   Signed, Larae Grooms, MD  12/01/2015 9:54 AM    [facility] [address], [place], [place]  [address] Phone: [phone]; Fax: [phone]

## 2015-12-01 NOTE — Patient Instructions (Addendum)
Medication Instructions:  Increase Amlodipine to 10 mg. All other medications remain the same  Labwork: None  Testing/Procedures: Your physician has requested that you have an echocardiogram. Echocardiography is a painless test that uses sound waves to create images of your heart. It provides your doctor with information about the size and shape of your heart and how well your heart's chambers and valves are working. This procedure takes approximately one hour. There are no restrictions for this procedure.   Follow-Up: HTN clinic with Pharmacy in 2 weeks. Please bring your BP cuff to this visit.     If you need a refill on your cardiac medications before your next appointment, please call your pharmacy.

## 2015-12-15 ENCOUNTER — Ambulatory Visit (INDEPENDENT_AMBULATORY_CARE_PROVIDER_SITE_OTHER): Payer: BLUE CROSS/BLUE SHIELD | Admitting: Pharmacist

## 2015-12-15 ENCOUNTER — Ambulatory Visit (HOSPITAL_COMMUNITY): Payer: BLUE CROSS/BLUE SHIELD | Attending: Cardiology

## 2015-12-15 ENCOUNTER — Other Ambulatory Visit: Payer: Self-pay

## 2015-12-15 ENCOUNTER — Telehealth: Payer: Self-pay | Admitting: Interventional Cardiology

## 2015-12-15 VITALS — BP 176/105 | HR 49 | Wt 247.0 lb

## 2015-12-15 DIAGNOSIS — I351 Nonrheumatic aortic (valve) insufficiency: Secondary | ICD-10-CM | POA: Diagnosis not present

## 2015-12-15 DIAGNOSIS — I10 Essential (primary) hypertension: Secondary | ICD-10-CM

## 2015-12-15 DIAGNOSIS — Z8249 Family history of ischemic heart disease and other diseases of the circulatory system: Secondary | ICD-10-CM | POA: Diagnosis not present

## 2015-12-15 DIAGNOSIS — I340 Nonrheumatic mitral (valve) insufficiency: Secondary | ICD-10-CM | POA: Diagnosis not present

## 2015-12-15 DIAGNOSIS — I119 Hypertensive heart disease without heart failure: Secondary | ICD-10-CM | POA: Insufficient documentation

## 2015-12-15 DIAGNOSIS — R011 Cardiac murmur, unspecified: Secondary | ICD-10-CM | POA: Insufficient documentation

## 2015-12-15 DIAGNOSIS — Z6838 Body mass index (BMI) 38.0-38.9, adult: Secondary | ICD-10-CM | POA: Insufficient documentation

## 2015-12-15 DIAGNOSIS — E669 Obesity, unspecified: Secondary | ICD-10-CM | POA: Insufficient documentation

## 2015-12-15 LAB — BASIC METABOLIC PANEL
BUN: 50 mg/dL — AB (ref 7–25)
CALCIUM: 9.4 mg/dL (ref 8.6–10.4)
CO2: 26 mmol/L (ref 20–31)
CREATININE: 2.09 mg/dL — AB (ref 0.50–1.05)
Chloride: 106 mmol/L (ref 98–110)
Glucose, Bld: 79 mg/dL (ref 65–99)
Potassium: 3.9 mmol/L (ref 3.5–5.3)
Sodium: 137 mmol/L (ref 135–146)

## 2015-12-15 LAB — ECHOCARDIOGRAM COMPLETE: WEIGHTICAEL: 3952 [oz_av]

## 2015-12-15 MED ORDER — CARVEDILOL 6.25 MG PO TABS: 6.2500 mg | ORAL_TABLET | Freq: Two times a day (BID) | ORAL | 11 refills | Status: DC

## 2015-12-15 NOTE — Patient Instructions (Addendum)
STOP your nebivolol. START taking carvedilol 6.25mg  twice a day Continue your other blood pressure medications  We will check your labs today - if everything is stable, plan to start spironolactone 25mg  once daily and follow up in a week.  Please write down your blood pressure readings at home and bring in your cuff with you to your next visit.

## 2015-12-15 NOTE — Progress Notes (Signed)
Patient ID: Erin Dean                 DOB: 09-May-1965                      MRN: [id number]     HPI: Erin Dean is a 50 y.o. female referred by Dr. Irish Lack to HTN clinic. PMH is significant for anxiety and hypertension including ED visit in 2014 for hypertensive urgency with BP up to 221/94. Pt was seen by Dr. Irish Lack to establish care 2 weeks ago and BP was elevated at 220/100 (had been out of meds for a day). Patient presents today for BP follow up.  Pt checks her BP at home - reports that systolic readings are in the 170s on a good day. Highest systolic at home was 355, highest in clinic was 230. She takes all her medications in the morning. Reports compliance with her doses other than running out for a day before OV with Dr. Irish Lack a few weeks ago. Denies dizziness, headache, or blurred vision. Has some fatigue. All medications are affordable except nebivolol which costs $60 a month. Once BP meds are stabilized, pt would like 90 day supply.  Current HTN meds: amlodipine 10mg  daily, nebivolol 20mg  daily, valsartan-HCTZ 320-25mg  daily BP goal: <140/71mmHg  Family History: Paternal grandmother with stroke, maternal grandfather and uncle with MI.  Social History: Patient denies tobacco, alcohol, or illicit drug use.  Diet: Avoiding salt, stopped drinking soda. Has lost 20 lbs. Eats fruit and salad.  Exercise: Walks every afternoon with her granddaughter. Walks for 15-20 minutes, takes the dog out too.  Wt Readings from Last 3 Encounters:  12/01/15 246 lb (111.6 kg)  09/22/12 253 lb (114.8 kg)   BP Readings from Last 3 Encounters:  12/01/15 (!) 220/100  09/23/12 (!) 158/84   Pulse Readings from Last 3 Encounters:  12/01/15 (!) 49  09/23/12 96    Renal function: CrCl cannot be calculated (Unknown ideal weight.).  Past Medical History:  Diagnosis Date  . Hypertension     Current Outpatient Prescriptions on File Prior to Visit  Medication Sig Dispense Refill  .  ALPRAZolam (XANAX) 0.5 MG tablet Take 0.25-0.5 mg by mouth 3 (three) times daily as needed (anxiety). Pt has not taken yet    . amLODipine (NORVASC) 10 MG tablet Take 1 tablet (10 mg total) by mouth daily. 180 tablet 3  . escitalopram (LEXAPRO) 10 MG tablet Take 10 mg by mouth daily.    . ferrous sulfate 325 (65 FE) MG tablet Take 325 mg by mouth 2 (two) times daily with a meal.     . Nebivolol HCl (BYSTOLIC) 20 MG TABS Take 1 tablet by mouth daily.    . valsartan-hydrochlorothiazide (DIOVAN-HCT) 320-25 MG tablet Take 1 tablet by mouth daily.     No current facility-administered medications on file prior to visit.     No Known Allergies   Assessment/Plan:  1. Hypertension - BP above goal <140/25mmHg but improving d/t medication compliance. Will d/c nebivolol and start carvedilol to help with cost. Will start at lower than equivalent dose d/t HR in upper 40s and pt complaint of fatigue. Continue other BP meds. Will also check BMET today since we have no baseline on file - if stable, will plan to start spironolactone 25mg  daily and follow up in 1 week. Pt will record home BP readings and bring her cuff in with her to next visit. Will also switch meds  to 90 day supply once stabilized on BP regimen.   Megan E. Supple, PharmD, [place] [address], [place], [place] [address] Phone: [phone]; Fax: [phone] 12/15/2015 10:13 AM   Addendum: BMET showed SCr of 2.09, may be new baseline. CrCl still 35mL/min. K stable. Ok to start spironolactone 25mg  daily. Pt aware, scheduled f/u labs and BP check in 1 week.

## 2015-12-15 NOTE — Telephone Encounter (Signed)
error 

## 2015-12-16 ENCOUNTER — Encounter: Payer: Self-pay | Admitting: Pharmacist

## 2015-12-16 MED ORDER — SPIRONOLACTONE 25 MG PO TABS: 25.0000 mg | ORAL_TABLET | Freq: Every day | ORAL | 11 refills | Status: DC

## 2015-12-19 NOTE — Telephone Encounter (Signed)
LM TO CALL BACK ./CY 

## 2015-12-19 NOTE — Telephone Encounter (Signed)
PT AWARE OF ECHO RESULTS./CY 

## 2015-12-19 NOTE — Telephone Encounter (Signed)
New message ° ° ° °Pt verbalized that she is returning call for rn  °

## 2015-12-23 ENCOUNTER — Ambulatory Visit (INDEPENDENT_AMBULATORY_CARE_PROVIDER_SITE_OTHER): Payer: BLUE CROSS/BLUE SHIELD | Admitting: Pharmacist

## 2015-12-23 ENCOUNTER — Encounter (INDEPENDENT_AMBULATORY_CARE_PROVIDER_SITE_OTHER): Payer: Self-pay

## 2015-12-23 ENCOUNTER — Encounter: Payer: Self-pay | Admitting: Pharmacist

## 2015-12-23 ENCOUNTER — Other Ambulatory Visit: Payer: BLUE CROSS/BLUE SHIELD | Admitting: *Deleted

## 2015-12-23 VITALS — BP 164/102 | HR 53

## 2015-12-23 DIAGNOSIS — I10 Essential (primary) hypertension: Secondary | ICD-10-CM

## 2015-12-23 LAB — BASIC METABOLIC PANEL
BUN: 43 mg/dL — ABNORMAL HIGH (ref 7–25)
CHLORIDE: 103 mmol/L (ref 98–110)
CO2: 26 mmol/L (ref 20–31)
CREATININE: 2.25 mg/dL — AB (ref 0.50–1.05)
Calcium: 9.2 mg/dL (ref 8.6–10.4)
Glucose, Bld: 88 mg/dL (ref 65–99)
POTASSIUM: 4.5 mmol/L (ref 3.5–5.3)
SODIUM: 138 mmol/L (ref 135–146)

## 2015-12-23 MED ORDER — VALSARTAN-HYDROCHLOROTHIAZIDE 320-25 MG PO TABS: 1.0000 | ORAL_TABLET | Freq: Every day | ORAL | 6 refills | Status: DC

## 2015-12-23 NOTE — Patient Instructions (Addendum)
A new Rx for valsartan/HCTZ 360/25mg  has been sent to [facility].  Continue checking your BP at home and bring readings to next visit.  Continue to limit salt intake.   We will call you with lab results as soon as they return and adjust medications.

## 2015-12-23 NOTE — Progress Notes (Signed)
Patient ID: Erin Dean                 DOB: [birth year]                      MRN: [id number]     HPI: Erin Dean is a 50 y.o. female presents today for 1 week BP follow up. PMH is significant for anxiety and hypertension including ED visit in 2014 for hypertensive urgency with BP up to 221/94. Recently established care with Dr. Irish Lack and BP was found to be elevated at 220/100 (had been out of meds for a day). BP 176/105, HR 49 in BP clinic last week - improved with medication adherence. At that time, nebivolol 20mg  daily was changed to carvedilol 6.25mg  BID due to cost issues and spironolactone 25mg  daily was added. Noted K 3.9, SCr 2.09.   Pt checks her BP at home - reports that systolic readings are generally in the 170s; highest 186, lowest 168. Brought home BP cuff to visit. Medications taken this morning prior to visit. Reports compliance with her doses. Confirmed fill Hx with pharmacy - has not had Diovan/HCTZ 320/25mg  since 9/7 (30 day supply with NO refills ordered). Has been taking two tablets of 160/12.5mg  in the interim. Denies dizziness, headache, or blurred vision. Has some fatigue. Once BP meds are stabilized, pt would like 90 day supply.  Current HTN meds: amlodipine 10mg  daily, carvedilol 6.25mg  BID, valsartan-HCTZ 320-25mg  daily, spironolactone 25mg  daily BP goal: <140/52mmHg  Family History: Paternal grandmother with stroke, maternal grandfather and uncle with MI.  Social History: Patient denies tobacco, alcohol, or illicit drug use.  Diet: Avoiding salt, stopped drinking soda. Has lost 20 lbs. Eats fruit and salad.  Exercise: Walks every afternoon with her granddaughter. Walks for 15-20 minutes, takes the dog out too.  Wt Readings from Last 3 Encounters:  12/15/15 247 lb (112 kg)  12/01/15 246 lb (111.6 kg)  09/22/12 253 lb (114.8 kg)   BP Readings from Last 3 Encounters:  12/15/15 (!) 176/105  12/01/15 (!) 220/100  09/23/12 (!) 158/84   Pulse  Readings from Last 3 Encounters:  12/15/15 (!) 49  12/01/15 (!) 49  09/23/12 96    Renal function: Estimated Creatinine Clearance: 41.6 mL/min (by C-G formula based on SCr of 2.09 mg/dL (H)).  Past Medical History:  Diagnosis Date  . Hypertension     Current Outpatient Prescriptions on File Prior to Visit  Medication Sig Dispense Refill  . ALPRAZolam (XANAX) 0.5 MG tablet Take 0.25-0.5 mg by mouth 3 (three) times daily as needed (anxiety). Pt has not taken yet    . amLODipine (NORVASC) 10 MG tablet Take 1 tablet (10 mg total) by mouth daily. 180 tablet 3  . carvedilol (COREG) 6.25 MG tablet Take 1 tablet (6.25 mg total) by mouth 2 (two) times daily. 60 tablet 11  . escitalopram (LEXAPRO) 10 MG tablet Take 10 mg by mouth daily.    . ferrous sulfate 325 (65 FE) MG tablet Take 325 mg by mouth 2 (two) times daily with a meal.     . spironolactone (ALDACTONE) 25 MG tablet Take 1 tablet (25 mg total) by mouth daily. 30 tablet 11  . valsartan-hydrochlorothiazide (DIOVAN-HCT) 320-25 MG tablet Take 1 tablet by mouth daily.     No current facility-administered medications on file prior to visit.     No Known Allergies   Assessment/Plan:  Hypertension - BP above goal <140/70mmHg but continuing to improve  with recent addition of spironolactone. Manual BP 164/102, HR 53. Home BP cuff 192/109, HR 52. Approx. 30 mmHg difference. No complaint of increased fatigue with recent change from nebivolol >> carvedilol. Will maintain carvedilol at 6.25mg  for now. Will also check BMET today. If stable, plan to increase spironolactone to 50mg  daily.

## 2015-12-26 ENCOUNTER — Telehealth: Payer: Self-pay | Admitting: Pharmacist

## 2015-12-26 MED ORDER — HYDRALAZINE HCL 25 MG PO TABS: 25.0000 mg | ORAL_TABLET | Freq: Three times a day (TID) | ORAL | 11 refills | Status: DC

## 2015-12-26 NOTE — Telephone Encounter (Signed)
Followed up with pt regarding BMET results from last week after pharmacy HTN visit. Slight bump in SCr but elsewise stable. Pt is maxed out on 5 BP medications and is still hypertensive (although BP much improved from SBP 220s to 160s). Sent in rx for pt to start hydralazine 25mg  TID. Will f/u in BP clinic in 1 month. Pt verbalized understanding.

## 2016-01-23 ENCOUNTER — Ambulatory Visit: Payer: BLUE CROSS/BLUE SHIELD

## 2016-01-24 ENCOUNTER — Ambulatory Visit: Payer: BLUE CROSS/BLUE SHIELD

## 2016-01-31 NOTE — Progress Notes (Deleted)
Patient ID: Erin Dean                 DOB: 1965-12-10                      MRN: [id number]     HPI: Erin Dean a 50 y.o.femalewell known to HTN clinic who presents today for follow up. PMH is significant for anxiety and hypertension including ED visit in 2014 for hypertensive urgency with BP up to 221/94. Recently established care with Dr. Irish Lack and BP was found to be elevated at 220/100 (had been out of meds for a day). At last office visit, hydralazine 25mg  TID was added rather than up titrating spironolactone due to bump in SCr.  Pt checks her BP at home - reports that systolic readings are generally in the   Denies dizziness, headache, or blurred vision. Has some fatigue.   Once BP meds are stabilized, pt would like 90 day supply.  Current HTN meds:amlodipine 10mg  daily, carvedilol 6.25mg  BID, valsartan-HCTZ 320-25mg  daily, spironolactone 25mg  daily, hydralazine 25mg  TID BP goal: <140/87mmHg  Family History: Paternal grandmother with stroke, maternal grandfather and uncle with MI.  Social History: Patient denies tobacco, alcohol, or illicit drug use.  Diet:Avoiding salt, stopped drinking soda. Has lost 20 lbs. Eats fruit and salad.  Exercise:Walks every afternoon with her granddaughter. Walks for 15-20 minutes, takes the dog out too.  Wt Readings from Last 3 Encounters:  12/15/15 247 lb (112 kg)  12/01/15 246 lb (111.6 kg)  09/22/12 253 lb (114.8 kg)   BP Readings from Last 3 Encounters:  12/23/15 (!) 164/102  12/15/15 (!) 176/105  12/01/15 (!) 220/100   Pulse Readings from Last 3 Encounters:  12/23/15 (!) 53  12/15/15 (!) 49  12/01/15 (!) 49    Renal function: CrCl cannot be calculated (Unknown ideal weight.).  Past Medical History:  Diagnosis Date  . Hypertension     Current Outpatient Prescriptions on File Prior to Visit  Medication Sig Dispense Refill  . ALPRAZolam (XANAX) 0.5 MG tablet Take 0.25-0.5 mg by mouth 3 (three) times  daily as needed (anxiety). Pt has not taken yet    . amLODipine (NORVASC) 10 MG tablet Take 1 tablet (10 mg total) by mouth daily. 180 tablet 3  . carvedilol (COREG) 6.25 MG tablet Take 1 tablet (6.25 mg total) by mouth 2 (two) times daily. 60 tablet 11  . escitalopram (LEXAPRO) 10 MG tablet Take 10 mg by mouth daily.    . ferrous sulfate 325 (65 FE) MG tablet Take 325 mg by mouth 2 (two) times daily with a meal.     . hydrALAZINE (APRESOLINE) 25 MG tablet Take 1 tablet (25 mg total) by mouth 3 (three) times daily. 90 tablet 11  . spironolactone (ALDACTONE) 25 MG tablet Take 1 tablet (25 mg total) by mouth daily. 30 tablet 11  . valsartan-hydrochlorothiazide (DIOVAN-HCT) 320-25 MG tablet Take 1 tablet by mouth daily. 90 tablet 6   No current facility-administered medications on file prior to visit.     No Known Allergies   Assessment/Plan:  1. Hypertension - increase hydralazine

## 2016-02-01 ENCOUNTER — Ambulatory Visit: Payer: BLUE CROSS/BLUE SHIELD | Admitting: Pharmacist

## 2016-08-04 ENCOUNTER — Emergency Department (HOSPITAL_COMMUNITY)
Admission: EM | Admit: 2016-08-04 | Discharge: 2016-08-04 | Disposition: A | Discharge status: Home / Self Care | Payer: Worker's Compensation | Attending: Emergency Medicine | Admitting: Emergency Medicine

## 2016-08-04 ENCOUNTER — Encounter (HOSPITAL_COMMUNITY): Payer: Self-pay | Admitting: Emergency Medicine

## 2016-08-04 ENCOUNTER — Emergency Department (HOSPITAL_COMMUNITY): Payer: Worker's Compensation

## 2016-08-04 DIAGNOSIS — S6710XA Crushing injury of unspecified finger(s), initial encounter: Secondary | ICD-10-CM

## 2016-08-04 DIAGNOSIS — Y990 Civilian activity done for income or pay: Secondary | ICD-10-CM | POA: Insufficient documentation

## 2016-08-04 DIAGNOSIS — S61237A Puncture wound without foreign body of left little finger without damage to nail, initial encounter: Secondary | ICD-10-CM | POA: Insufficient documentation

## 2016-08-04 DIAGNOSIS — S6992XA Unspecified injury of left wrist, hand and finger(s), initial encounter: Secondary | ICD-10-CM | POA: Diagnosis present

## 2016-08-04 DIAGNOSIS — Z79899 Other long term (current) drug therapy: Secondary | ICD-10-CM | POA: Insufficient documentation

## 2016-08-04 DIAGNOSIS — Y939 Activity, unspecified: Secondary | ICD-10-CM | POA: Diagnosis not present

## 2016-08-04 DIAGNOSIS — Y929 Unspecified place or not applicable: Secondary | ICD-10-CM | POA: Insufficient documentation

## 2016-08-04 DIAGNOSIS — I10 Essential (primary) hypertension: Secondary | ICD-10-CM | POA: Insufficient documentation

## 2016-08-04 DIAGNOSIS — W268XXA Contact with other sharp object(s), not elsewhere classified, initial encounter: Secondary | ICD-10-CM | POA: Diagnosis not present

## 2016-08-04 MED ORDER — HYDROCODONE-ACETAMINOPHEN 5-325 MG PO TABS: 2.0000 | ORAL_TABLET | ORAL | 0 refills | Status: DC | PRN

## 2016-08-04 MED ORDER — HYDROCODONE-ACETAMINOPHEN 5-325 MG PO TABS
2.0000 | ORAL_TABLET | Freq: Once | ORAL | Status: AC
  Administered 2016-08-04: 2 via ORAL
  Filled 2016-08-04: qty 2

## 2016-08-04 MED ORDER — SULFAMETHOXAZOLE-TRIMETHOPRIM 800-160 MG PO TABS: 1.0000 | ORAL_TABLET | Freq: Two times a day (BID) | ORAL | 0 refills | Status: AC

## 2016-08-04 NOTE — ED Triage Notes (Signed)
Family member in room with PT wanted to know if Pt could get  Tdap at PCP office because she did not want to wait any longer.

## 2016-08-04 NOTE — ED Triage Notes (Signed)
Pt requesting a work note.

## 2016-08-04 NOTE — Discharge Instructions (Signed)
See your Physicain for recheck of your blood pressure   

## 2016-08-04 NOTE — ED Notes (Signed)
Declined W/C at D/C and was escorted to lobby by RN. 

## 2016-08-04 NOTE — ED Provider Notes (Signed)
[name] DEPT Provider Note   CSN: [id number] Arrival date & time: 08/04/16  1323  By signing my name below, I, Erin Dean, attest that this documentation has been prepared under the direction and in the presence of non-physician practitioner, Erin Hashimoto, PA-C. Electronically Signed: Higinio Dean, Scribe. 08/04/2016. 3:09 PM.  History   Chief Complaint Chief Complaint  Patient presents with  . Finger Injury   The history is provided by the patient. No language interpreter was used.   HPI Comments: Erin Dean is a 51 y.o. left-hand dominant female with PMHx of HTN, who presents to the Emergency Department complaining of gradually worsening, left fifth finger pain s/p an injury that occurred at work this afternoon. Pt reports she was working on a machine this afternoon when a small metal pin suddenly ejected, puncturing the left side of her left fifth finger and exiting through the right side. She states associated swelling to her left fifth digit. She notes she has not taken any medication to relieve her pain prior to visiting the ED. Pt denies any other injuries or complaints.   Past Medical History:  Diagnosis Date  . Hypertension    Patient Active Problem List   Diagnosis Date Noted  . Essential hypertension 12/01/2015  . Morbid obesity ([place]) 12/01/2015  . Lower extremity edema 12/01/2015  . Heart murmur 12/01/2015  . Hypertensive urgency 09/22/2012  . Anxiety 09/22/2012   Past Surgical History:  Procedure Laterality Date  . CESAREAN SECTION      OB History    No data available     Home Medications    Prior to Admission medications   Medication Sig Start Date End Date Taking? Authorizing Provider  ALPRAZolam Duanne Moron) 0.5 MG tablet Take 0.25-0.5 mg by mouth 3 (three) times daily as needed (anxiety). Pt has not taken yet    Historical Provider, MD  amLODipine (NORVASC) 10 MG tablet Take 1 tablet (10 mg total) by mouth daily. 12/01/15 02/29/16  Jettie Booze, MD  carvedilol (COREG) 6.25 MG tablet Take 1 tablet (6.25 mg total) by mouth 2 (two) times daily. 12/15/15   Jettie Booze, MD  escitalopram (LEXAPRO) 10 MG tablet Take 10 mg by mouth daily.    Historical Provider, MD  ferrous sulfate 325 (65 FE) MG tablet Take 325 mg by mouth 2 (two) times daily with a meal.     Historical Provider, MD  hydrALAZINE (APRESOLINE) 25 MG tablet Take 1 tablet (25 mg total) by mouth 3 (three) times daily. 12/26/15 12/20/16  Jettie Booze, MD  spironolactone (ALDACTONE) 25 MG tablet Take 1 tablet (25 mg total) by mouth daily. 12/16/15 12/10/16  Jettie Booze, MD  valsartan-hydrochlorothiazide (DIOVAN-HCT) 320-25 MG tablet Take 1 tablet by mouth daily. 12/23/15   Burnell Blanks, MD    Family History Family History  Problem Relation Age of Onset  . Heart attack Maternal Uncle   . Heart attack Maternal Grandfather   . Stroke Paternal Grandmother     Social History Social History  Substance Use Topics  . Smoking status: Never Smoker  . Smokeless tobacco: Never Used  . Alcohol use No   Allergies   Patient has no known allergies.  Review of Systems Review of Systems  Constitutional: Negative for fever.  Musculoskeletal: Positive for arthralgias and joint swelling.   Physical Exam Updated Vital Signs BP (!) 217/95   Pulse 86   Temp 98.9 F (37.2 C) (Oral)   Resp 16  Ht 5\' 8"  (1.727 m)   Wt 275 lb 8 oz (125 kg)   LMP  (LMP Unknown)   SpO2 100%   BMI 41.89 kg/m   Physical Exam  Constitutional: She is oriented to person, place, and time. She appears well-developed and well-nourished.  HENT:  Head: Normocephalic.  Eyes: EOM are normal.  Neck: Normal range of motion.  Pulmonary/Chest: Effort normal.  Abdominal: She exhibits no distension.  Musculoskeletal: Normal range of motion. She exhibits edema and tenderness.  Puncture wound through the dorsal aspect of the left fifth finger   Neurological: She is alert and  oriented to person, place, and time.  Psychiatric: She has a normal mood and affect.  Nursing note and vitals reviewed.  ED Treatments / Results  DIAGNOSTIC STUDIES:  Oxygen Saturation is 100% on RA, normal by my interpretation.    COORDINATION OF CARE:  3:04 PM Discussed treatment Dean with pt at bedside and pt agreed to Dean.  Labs (all labs ordered are listed, but only abnormal results are displayed) Labs Reviewed - No data to display  EKG  EKG Interpretation None       Radiology Dg Finger Little Left  Result Date: 08/04/2016 CLINICAL DATA:  51 year old female with a history of finger injury/industrial accident EXAM: LEFT LITTLE FINGER 2+V COMPARISON:  None. FINDINGS: No acute displaced fracture. No focal soft tissue swelling. No radiopaque foreign body. Mild osteoarthritis of the interphalangeal joints. IMPRESSION: No acute bony abnormality. Electronically Signed   By: Corrie Mckusick D.O.   On: 08/04/2016 13:55    Procedures Procedures (including critical care time)  Medications Ordered in ED Medications - No data to display  Initial Impression / Assessment and Dean / ED Course  I have reviewed the triage vital signs and the nursing notes.  Pertinent labs & imaging results that were available during my care of the patient were reviewed by me and considered in my medical decision making (see chart for details).     Patient X-Ray negative for obvious fracture or dislocation.  Pt advised to follow up with orthopedics. Patient given  splint while in ED, conservative therapy recommended and discussed. Patient will be discharged home & is agreeable with above Dean. Returns precautions discussed. Pt appears safe for discharge.  An After Visit Summary was printed and given to the patient.  Final Clinical Impressions(s) / ED Diagnoses   Final diagnoses:  Crushing injury of finger, initial encounter  Puncture wound of left little finger    New Prescriptions New  Prescriptions   HYDROCODONE-ACETAMINOPHEN (NORCO/VICODIN) 5-325 MG TABLET    Take 2 tablets by mouth every 4 (four) hours as needed.   SULFAMETHOXAZOLE-TRIMETHOPRIM (BACTRIM DS,SEPTRA DS) 800-160 MG TABLET    Take 1 tablet by mouth 2 (two) times daily.   I personally performed the services in this documentation, which was scribed in my presence.  The recorded information has been reviewed and considered.   Ronnald Collum.   Fransico Meadow, PA-C 08/04/16 1525 Pt advised to have her MD recheck her blood pressure next week.   Hollace Kinnier [place], PA-C 08/04/16 [place], MD 08/04/16 [phone]

## 2016-08-04 NOTE — ED Triage Notes (Signed)
Pt states she was working on machinery, and a tiny metal rod punctured her left pinkie finger, denies any other injury anywhere else. Pt HTN in triage, states shes taking her blood pressure medicine as described.

## 2017-04-09 DIAGNOSIS — I639 Cerebral infarction, unspecified: Secondary | ICD-10-CM

## 2017-04-09 HISTORY — DX: Cerebral infarction, unspecified: I63.9

## 2017-04-17 ENCOUNTER — Emergency Department (HOSPITAL_COMMUNITY): Payer: BLUE CROSS/BLUE SHIELD

## 2017-04-17 ENCOUNTER — Encounter (HOSPITAL_COMMUNITY): Payer: Self-pay

## 2017-04-17 ENCOUNTER — Inpatient Hospital Stay (HOSPITAL_COMMUNITY)
Admission: EM | Admit: 2017-04-17 | Discharge: 2017-04-23 | DRG: 040 | Disposition: A | Discharge status: Home / Self Care | Payer: BLUE CROSS/BLUE SHIELD | Attending: Neurology | Admitting: Neurology

## 2017-04-17 DIAGNOSIS — I110 Hypertensive heart disease with heart failure: Secondary | ICD-10-CM | POA: Diagnosis present

## 2017-04-17 DIAGNOSIS — I160 Hypertensive urgency: Secondary | ICD-10-CM | POA: Diagnosis not present

## 2017-04-17 DIAGNOSIS — I63412 Cerebral infarction due to embolism of left middle cerebral artery: Secondary | ICD-10-CM | POA: Diagnosis not present

## 2017-04-17 DIAGNOSIS — Z9119 Patient's noncompliance with other medical treatment and regimen: Secondary | ICD-10-CM | POA: Diagnosis not present

## 2017-04-17 DIAGNOSIS — Z6841 Body Mass Index (BMI) 40.0 and over, adult: Secondary | ICD-10-CM | POA: Diagnosis not present

## 2017-04-17 DIAGNOSIS — Z8249 Family history of ischemic heart disease and other diseases of the circulatory system: Secondary | ICD-10-CM

## 2017-04-17 DIAGNOSIS — R4701 Aphasia: Secondary | ICD-10-CM | POA: Diagnosis not present

## 2017-04-17 DIAGNOSIS — I361 Nonrheumatic tricuspid (valve) insufficiency: Secondary | ICD-10-CM | POA: Diagnosis not present

## 2017-04-17 DIAGNOSIS — D649 Anemia, unspecified: Secondary | ICD-10-CM | POA: Diagnosis present

## 2017-04-17 DIAGNOSIS — K449 Diaphragmatic hernia without obstruction or gangrene: Secondary | ICD-10-CM | POA: Diagnosis present

## 2017-04-17 DIAGNOSIS — I248 Other forms of acute ischemic heart disease: Secondary | ICD-10-CM | POA: Diagnosis not present

## 2017-04-17 DIAGNOSIS — N189 Chronic kidney disease, unspecified: Secondary | ICD-10-CM

## 2017-04-17 DIAGNOSIS — I130 Hypertensive heart and chronic kidney disease with heart failure and stage 1 through stage 4 chronic kidney disease, or unspecified chronic kidney disease: Secondary | ICD-10-CM | POA: Diagnosis not present

## 2017-04-17 DIAGNOSIS — I161 Hypertensive emergency: Secondary | ICD-10-CM | POA: Diagnosis present

## 2017-04-17 DIAGNOSIS — I10 Essential (primary) hypertension: Secondary | ICD-10-CM | POA: Diagnosis not present

## 2017-04-17 DIAGNOSIS — D631 Anemia in chronic kidney disease: Secondary | ICD-10-CM | POA: Diagnosis not present

## 2017-04-17 DIAGNOSIS — I509 Heart failure, unspecified: Secondary | ICD-10-CM | POA: Diagnosis present

## 2017-04-17 DIAGNOSIS — R29704 NIHSS score 4: Secondary | ICD-10-CM | POA: Diagnosis present

## 2017-04-17 DIAGNOSIS — I471 Supraventricular tachycardia: Secondary | ICD-10-CM | POA: Diagnosis present

## 2017-04-17 DIAGNOSIS — E785 Hyperlipidemia, unspecified: Secondary | ICD-10-CM | POA: Diagnosis present

## 2017-04-17 DIAGNOSIS — I6300 Cerebral infarction due to thrombosis of unspecified precerebral artery: Secondary | ICD-10-CM | POA: Diagnosis not present

## 2017-04-17 DIAGNOSIS — K920 Hematemesis: Secondary | ICD-10-CM | POA: Diagnosis not present

## 2017-04-17 DIAGNOSIS — N184 Chronic kidney disease, stage 4 (severe): Secondary | ICD-10-CM | POA: Diagnosis not present

## 2017-04-17 DIAGNOSIS — J9601 Acute respiratory failure with hypoxia: Secondary | ICD-10-CM | POA: Diagnosis present

## 2017-04-17 DIAGNOSIS — I639 Cerebral infarction, unspecified: Secondary | ICD-10-CM

## 2017-04-17 DIAGNOSIS — R2990 Unspecified symptoms and signs involving the nervous system: Secondary | ICD-10-CM | POA: Diagnosis present

## 2017-04-17 DIAGNOSIS — N179 Acute kidney failure, unspecified: Secondary | ICD-10-CM | POA: Diagnosis present

## 2017-04-17 DIAGNOSIS — D508 Other iron deficiency anemias: Secondary | ICD-10-CM

## 2017-04-17 DIAGNOSIS — I6389 Other cerebral infarction: Secondary | ICD-10-CM | POA: Diagnosis not present

## 2017-04-17 DIAGNOSIS — Z79899 Other long term (current) drug therapy: Secondary | ICD-10-CM | POA: Diagnosis not present

## 2017-04-17 DIAGNOSIS — I517 Cardiomegaly: Secondary | ICD-10-CM | POA: Diagnosis not present

## 2017-04-17 DIAGNOSIS — I272 Pulmonary hypertension, unspecified: Secondary | ICD-10-CM | POA: Diagnosis present

## 2017-04-17 DIAGNOSIS — R011 Cardiac murmur, unspecified: Secondary | ICD-10-CM | POA: Diagnosis not present

## 2017-04-17 HISTORY — DX: Supraventricular tachycardia: I47.1

## 2017-04-17 HISTORY — DX: Supraventricular tachycardia, unspecified: I47.10

## 2017-04-17 HISTORY — DX: Cerebral infarction, unspecified: I63.9

## 2017-04-17 LAB — I-STAT BETA HCG BLOOD, ED (MC, WL, AP ONLY): I-stat hCG, quantitative: <5 m[IU]/mL (ref <5)

## 2017-04-17 LAB — COMPREHENSIVE METABOLIC PANEL
ALT: 9 U/L — ABNORMAL LOW (ref 14–54)
AST: 19 U/L (ref 15–41)
Albumin: 3.4 g/dL — ABNORMAL LOW (ref 3.5–5.0)
Alkaline Phosphatase: 98 U/L (ref 38–126)
Anion gap: 11 (ref 5–15)
BUN: 40 mg/dL — ABNORMAL HIGH (ref 6–20)
CO2: 19 mmol/L — ABNORMAL LOW (ref 22–32)
Calcium: 8.8 mg/dL — ABNORMAL LOW (ref 8.9–10.3)
Chloride: 107 mmol/L (ref 101–111)
Creatinine, Ser: 3.22 mg/dL — ABNORMAL HIGH (ref 0.44–1.00)
GFR calc Af Amer: 18 mL/min — ABNORMAL LOW (ref >60)
GFR calc non Af Amer: 15 mL/min — ABNORMAL LOW (ref >60)
Glucose, Bld: 108 mg/dL — ABNORMAL HIGH (ref 65–99)
Potassium: 3.9 mmol/L (ref 3.5–5.1)
Sodium: 137 mmol/L (ref 135–145)
Total Bilirubin: 0.7 mg/dL (ref 0.3–1.2)
Total Protein: 8.2 g/dL — ABNORMAL HIGH (ref 6.5–8.1)

## 2017-04-17 LAB — DIFFERENTIAL
Basophils Absolute: 0 10*3/uL (ref 0.0–0.1)
Basophils Relative: 1 %
Eosinophils Absolute: 0.2 10*3/uL (ref 0.0–0.7)
Eosinophils Relative: 3 %
Lymphocytes Relative: 16 %
Lymphs Abs: 1.2 10*3/uL (ref 0.7–4.0)
Monocytes Absolute: 0.7 10*3/uL (ref 0.1–1.0)
Monocytes Relative: 9 %
Neutro Abs: 5.2 10*3/uL (ref 1.7–7.7)
Neutrophils Relative %: 71 %

## 2017-04-17 LAB — CBC
HCT: 30.7 % — ABNORMAL LOW (ref 36.0–46.0)
Hemoglobin: 9.3 g/dL — ABNORMAL LOW (ref 12.0–15.0)
MCH: 25.6 pg — ABNORMAL LOW (ref 26.0–34.0)
MCHC: 30.3 g/dL (ref 30.0–36.0)
MCV: 84.6 fL (ref 78.0–100.0)
Platelets: 257 10*3/uL (ref 150–400)
RBC: 3.63 MIL/uL — ABNORMAL LOW (ref 3.87–5.11)
RDW: 15.9 % — ABNORMAL HIGH (ref 11.5–15.5)
WBC: 7.4 10*3/uL (ref 4.0–10.5)

## 2017-04-17 LAB — I-STAT CHEM 8, ED
BUN: 36 mg/dL — ABNORMAL HIGH (ref 6–20)
Calcium, Ion: 1.09 mmol/L — ABNORMAL LOW (ref 1.15–1.40)
Chloride: 109 mmol/L (ref 101–111)
Creatinine, Ser: 3.4 mg/dL — ABNORMAL HIGH (ref 0.44–1.00)
Glucose, Bld: 107 mg/dL — ABNORMAL HIGH (ref 65–99)
HCT: 31 % — ABNORMAL LOW (ref 36.0–46.0)
Hemoglobin: 10.5 g/dL — ABNORMAL LOW (ref 12.0–15.0)
Potassium: 4 mmol/L (ref 3.5–5.1)
Sodium: 141 mmol/L (ref 135–145)
TCO2: 19 mmol/L — ABNORMAL LOW (ref 22–32)

## 2017-04-17 LAB — I-STAT TROPONIN, ED: Troponin i, poc: 0.22 ng/mL (ref 0.00–0.08)

## 2017-04-17 LAB — CBG MONITORING, ED: Glucose-Capillary: 111 mg/dL — ABNORMAL HIGH (ref 65–99)

## 2017-04-17 LAB — PROTIME-INR
INR: 1.08
Prothrombin Time: 13.9 seconds (ref 11.4–15.2)

## 2017-04-17 LAB — OCCULT BLOOD GASTRIC / DUODENUM (SPECIMEN CUP): Occult Blood, Gastric: POSITIVE — AB

## 2017-04-17 LAB — TROPONIN I: Troponin I: 0.29 ng/mL (ref <0.03)

## 2017-04-17 LAB — APTT: aPTT: 32 seconds (ref 24–36)

## 2017-04-17 LAB — HEMOGLOBIN AND HEMATOCRIT, BLOOD
HCT: 31.1 % — ABNORMAL LOW (ref 36.0–46.0)
HEMOGLOBIN: 9.5 g/dL — AB (ref 12.0–15.0)

## 2017-04-17 MED ORDER — FUROSEMIDE 10 MG/ML IJ SOLN
40.0000 mg | Freq: Once | INTRAMUSCULAR | Status: AC
  Administered 2017-04-17: 40 mg via INTRAVENOUS
  Filled 2017-04-17: qty 4

## 2017-04-17 MED ORDER — FENTANYL CITRATE (PF) 100 MCG/2ML IJ SOLN
50.0000 ug | Freq: Once | INTRAMUSCULAR | Status: AC
  Administered 2017-04-17: 50 ug via INTRAVENOUS
  Filled 2017-04-17: qty 2

## 2017-04-17 MED ORDER — ACETAMINOPHEN 650 MG RE SUPP: 650.0000 mg | RECTAL | Status: DC | PRN

## 2017-04-17 MED ORDER — STROKE: EARLY STAGES OF RECOVERY BOOK
Freq: Once | Status: DC
  Filled 2017-04-17: qty 1

## 2017-04-17 MED ORDER — ACETAMINOPHEN 325 MG PO TABS
650.0000 mg | ORAL_TABLET | ORAL | Status: DC | PRN
  Administered 2017-04-20: 650 mg via ORAL
  Filled 2017-04-17: qty 2

## 2017-04-17 MED ORDER — ALTEPLASE (STROKE) FULL DOSE INFUSION
90.0000 mg | Freq: Once | INTRAVENOUS | Status: AC
  Administered 2017-04-17: 90 mg via INTRAVENOUS
  Filled 2017-04-17: qty 100

## 2017-04-17 MED ORDER — FENTANYL CITRATE (PF) 100 MCG/2ML IJ SOLN
50.0000 ug | Freq: Once | INTRAMUSCULAR | Status: AC | PRN
  Administered 2017-04-17: 50 ug via INTRAVENOUS
  Filled 2017-04-17: qty 2

## 2017-04-17 MED ORDER — PANTOPRAZOLE SODIUM 40 MG IV SOLR
40.0000 mg | Freq: Every day | INTRAVENOUS | Status: DC
  Administered 2017-04-17 – 2017-04-18 (×2): 40 mg via INTRAVENOUS
  Filled 2017-04-17 (×2): qty 40

## 2017-04-17 MED ORDER — ONDANSETRON HCL 4 MG/2ML IJ SOLN
4.0000 mg | INTRAMUSCULAR | Status: DC | PRN
  Administered 2017-04-17: 4 mg via INTRAVENOUS
  Filled 2017-04-17: qty 2

## 2017-04-17 MED ORDER — IOPAMIDOL (ISOVUE-370) INJECTION 76%
INTRAVENOUS | Status: AC
  Administered 2017-04-17: 10:00:00
  Filled 2017-04-17: qty 50

## 2017-04-17 MED ORDER — SODIUM CHLORIDE 0.9 % IV SOLN
80.0000 mg | Freq: Once | INTRAVENOUS | Status: AC
  Administered 2017-04-17: 80 mg via INTRAVENOUS
  Filled 2017-04-17: qty 80

## 2017-04-17 MED ORDER — IOPAMIDOL (ISOVUE-370) INJECTION 76%
INTRAVENOUS | Status: AC
  Filled 2017-04-17: qty 50

## 2017-04-17 MED ORDER — ACETAMINOPHEN 325 MG PO TABS: 650.0000 mg | ORAL_TABLET | Freq: Once | ORAL | Status: DC

## 2017-04-17 MED ORDER — ACETAMINOPHEN 160 MG/5ML PO SOLN: 650.0000 mg | ORAL | Status: DC | PRN

## 2017-04-17 MED ORDER — CLEVIDIPINE BUTYRATE 0.5 MG/ML IV EMUL
INTRAVENOUS | Status: AC
  Administered 2017-04-17: 1 mg
  Filled 2017-04-17: qty 50

## 2017-04-17 MED ORDER — CLEVIDIPINE BUTYRATE 0.5 MG/ML IV EMUL
0.0000 mg/h | INTRAVENOUS | Status: DC
  Administered 2017-04-17: 4 mg/h via INTRAVENOUS
  Administered 2017-04-17: 16 mg/h via INTRAVENOUS
  Administered 2017-04-18: 2 mg/h via INTRAVENOUS
  Filled 2017-04-17 (×3): qty 50

## 2017-04-17 MED ORDER — CLEVIDIPINE BUTYRATE 0.5 MG/ML IV EMUL
INTRAVENOUS | Status: AC
  Administered 2017-04-17: 25 mg
  Filled 2017-04-17: qty 50

## 2017-04-17 MED ORDER — SODIUM CHLORIDE 0.9 % IV SOLN
50.0000 mL | Freq: Once | INTRAVENOUS | Status: AC
  Administered 2017-04-17: 50 mL via INTRAVENOUS

## 2017-04-17 MED ORDER — SODIUM CHLORIDE 0.9 % IV SOLN: 50.0000 mL/h | INTRAVENOUS | Status: DC

## 2017-04-17 NOTE — ED Notes (Signed)
Ordered dinner tray for pt.  

## 2017-04-17 NOTE — H&P (Signed)
Neurology H&P  CC: Aphasia  History is obtained from: Patient, coworker  HPI: Erin Dean is a 52 y.o. female who awoke in her normal state of health and was at work at 5:30 AM when she developed difficulty speaking.  She states that it was an abrupt change in that she was normal before this.  She denies numbness or weakness.  On arrival, she had a severe expressive aphasia, but was able to answer yes/no questions with head nods/shakes.  She was given IV TPA.  She is an AK I with low NIH and therefore CTA was not done, MRA was attempted but she was too short of breath when laying flat and therefore this was not done.  She improved markedly with the IV TPA, but then  Began complaining of abdominal pain.  She started vomiting with concern for hematemesis.   LKW: 5:30 AM tpa given?:  Modified Rankin Score: 0  ROS: Unable to obtain due to altered mental status.   Past Medical History:  Diagnosis Date  . Hypertension      Family History  Problem Relation Age of Onset  . Heart attack Maternal Uncle   . Heart attack Maternal Grandfather   . Stroke Paternal Grandmother      Social History:  reports that  has never smoked. she has never used smokeless tobacco. She reports that she does not drink alcohol or use drugs.   Exam: Current vital signs: BP (!) 145/65   Pulse 79   Resp (!) 23   Wt 118.3 kg (260 lb 12.9 oz)   SpO2 96%   BMI 39.66 kg/m  Vital signs in last 24 hours: Pulse Rate:  [71-108] 79 (01/09 1200) Resp:  [9-29] 23 (01/09 1200) BP: (145-209)/(65-119) 145/65 (01/09 1200) SpO2:  [86 %-96 %] 96 % (01/09 1200) Weight:  [118.3 kg (260 lb 12.9 oz)] 118.3 kg (260 lb 12.9 oz) (01/09 0900)  Physical Exam  Constitutional: Appears well-developed and well-nourished.  Psych: Affect appropriate to situation Eyes: No scleral injection HENT: No OP obstrucion Head: Normocephalic.  Cardiovascular: Normal rate and regular rhythm.  Respiratory: Effort normal and breath  sounds normal to anterior ascultation GI: Soft.  No distension. There is no tenderness.  Skin: WDI  Neuro: Mental Status: Patient is awake, alert, she is unable to answer questions, with severe expressive aphasia, however she is able to answer questions reliably with head shakes/nods Patient is able to give a clear and coherent history. No signs  neglect Cranial Nerves: II: Visual Fields are full. Pupils are equal, round, and reactive to light.   III,IV, VI: EOMI without ptosis or diploplia.  V: Facial sensation is symmetric to temperature VII: Facial movement is symmetric.  VIII: hearing is intact to voice X: Uvula elevates symmetrically XI: Shoulder shrug is symmetric. XII: tongue is midline without atrophy or fasciculations.  Motor: Tone is normal. Bulk is normal. 5/5 strength was present in all four extremities.  Sensory: Sensation is symmetric to light touch and temperature in the arms and legs. 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  Unfortunately, I would be very hesitant to reverse this even if  there is some GI bleeding(Gastroccult pending) even her rapid improvement.  I suspect the elevated troponin is related to the stroke, no chest pain or EKG change, will trend.   Recommendations: 1. HgbA1c, fasting lipid panel 2. MRI, MRA  of the brain without contrast 3. Frequent neuro checks 4. Echocardiogram 5. Carotid dopplers 6. Prophylactic therapy-Antiplatelet med: Aspirin - dose 325mg  PO or 300mg  PR 7. Risk factor modification 8. Telemetry monitoring 9. PT consult, OT consult, Speech  consult 10. consulted CCM for assistance with shortness of breath, elevated troponin, potential GI bleed, AK I 11.  H&H every 6 hours 12.  Hydration overnight for AK I 13.  Trend troponins  This patient is critically ill and at significant risk of neurological worsening, death and care requires constant monitoring of vital signs, hemodynamics,respiratory and cardiac monitoring, neurological assessment, discussion with family, other specialists and medical decision making of high complexity. I spent 50 minutes of neurocritical care time  in the care of  this patient.  Roland Rack, MD [facility] [phone]  If 7pm- 7am, please page neurology on call as listed in [facility]. 04/17/2017  12:35 PM

## 2017-04-17 NOTE — ED Notes (Signed)
Lab results reported to Nurse Carlis Abbott.

## 2017-04-17 NOTE — ED Notes (Signed)
Xray at the bedside.

## 2017-04-17 NOTE — ED Notes (Signed)
Meal tray delivered.

## 2017-04-17 NOTE — Progress Notes (Signed)
Pharmacist Code Stroke Response  Notified to mix tPA at 0948 by Dr. Leonel Ramsay Delivered tPA to RN at [phone]  Issues/delays encountered (if applicable): N/A  Hollyanne Schloesser, Rande Lawman 04/17/17 9:55 AM

## 2017-04-17 NOTE — Code Documentation (Signed)
52yo female arriving to [facility] via [place] at [phone].  Patient from work where she was noted to have blurred vision and difficulty speaking.  EMS assessed aphasia, headache behind left eye and patient hypertensive at 270/140.  Patient with h/o hypertension and anxiety.  Patient reports she did not take her medications today.  Stroke team at the bedside on patient arrival.  Labs drawn and patient cleared for CT by Dr. Wilson Singer.  Patient to CT with team.  NIHSS 4, see documentation for details and code stroke times.  Patient with expressive aphasia on exam, comprehension intact.  Patient hypertensive and treated with Labetalol 20mg  IVP followed by Cleviprex gtt per order.  CT completed.  Patient with difficulty laying flat on CT table, oxygen administered via nasal cannula.  Decision made to treat with tPA and pharmacy at the bedside to mix.  BP within tPA parameters at 170/86 at 0955.  9mg  tPA bolus administered over 1 minute at 0955 followed by 81mg /hr for a total of 90mg  per pharmacy dosing.  Decision made to proceed to MRI for MRA imaging.  SBP increased to 185 at 0958, Cleviprex gtt increased, [place] at the bedside and aware.  BP 169/84 at 0959.  MRI notified and patient transported to MRI by team at 1004.  Patient placed on NRB in MRI.  Patient given Lasix 40mg  IVP per EDP.  Patient prepped for MRI with IV gtts transferred over to MRI compatible pump and patient placed on monitor.  Patient with improvement in aphasia, now able to state age and month. Patient c/o pain behind left eye which has not worsened.  Patient transferred into MRI, however, patient could not tolerate laying flat.  Dr. Leonel Ramsay made aware with order to discontinue MRI and transport patient back to the ED.  SBP increased while in MRI, however, back within parameters once patient out of MRI and calm.  Patient transferred to the ED.  Patient reassessed with continued improvement in speech, now able to name and read cards, still with expressive  aphasia.  Patient with increase in BP, Cleviprex gtt titrated until BP in parameters, see MAR and VS documentation.  Family in room and patient appears anxious, encouraged patient to focus on breathing and remain calm to avoid hypertension.  Patient continuing to report left eye pain which has not worsened.  Dr. Leonel Ramsay made aware of HTN and pain and arrived to the bedside.  Patient and family updated.  BP now in parameters.  Orders for pain management placed.  Patient to be admitted to ICU when bed available.  Bedside handoff with ED RN Carlis Abbott.

## 2017-04-17 NOTE — Consult Note (Signed)
PULMONARY / CRITICAL CARE MEDICINE   Name: Erin Dean MRN: [id number] DOB: [birth year]    ADMISSION DATE:  04/17/2017 CONSULTATION DATE:  04/17/2017  REFERRING MD:  Dr. Katherine Roan   CHIEF COMPLAINT:  CVA/ ? UGIB/ AKI/ +troponin  HISTORY OF PRESENT ILLNESS:   52 year old left handed female with PMH significant for hypertension and heart murmur who presented with aphasia, blurred vision, and headache.  Patient reports she did not take her blood pressure medicine this morning and felt normal upon waking around 0530, LKW.  Works at [employer] when a co-worker called EMS for difficulty speaking.  Denies chest pain, shortness of breath, numbness or weakness.  Denies history of smoking, alcohol, or illicit drug abuse.  Her hypertension is managed by her primary physician at [place].   In the ER, she arrived as a code stroke.  Noted to be hypertensive 270/140.  NIHSS 4 with severe expressive aphasia.  Treated with labetalol and placed on cleviprex gtt.  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  Given full dose TPA at 0955 with marked improvment.  CTA deferred due to AKI.  MRA attempted however patient could not lay flat due to shortness of breath and therefore aborted. She then complained of abdominal pain and had small volume emesis, dark per patient;  occult positive.  Placed on Protonix drip and PCCM consulted for medical management.    PAST MEDICAL HISTORY :  She  has a past medical history of Hypertension.  PAST SURGICAL HISTORY: She  has a past surgical history that includes Cesarean section.  No Known Allergies  No current facility-administered medications on file prior to encounter.    Current Outpatient Medications on File Prior to Encounter  Medication Sig  . carvedilol (COREG) 6.25 MG tablet Take 1 tablet (6.25 mg total) by mouth 2 (two) times daily.  Marland Kitchen escitalopram (LEXAPRO) 10 MG tablet Take 10 mg by mouth daily.  . ferrous sulfate 325 (65 FE) MG  tablet Take 325 mg by mouth 2 (two) times daily with a meal.   . ALPRAZolam (XANAX) 0.5 MG tablet Take 0.25-0.5 mg by mouth 3 (three) times daily as needed (anxiety). Pt has not taken yet  . amLODipine (NORVASC) 10 MG tablet Take 1 tablet (10 mg total) by mouth daily.  . hydrALAZINE (APRESOLINE) 25 MG tablet Take 1 tablet (25 mg total) by mouth 3 (three) times daily.  Marland Kitchen HYDROcodone-acetaminophen (NORCO/VICODIN) 5-325 MG tablet Take 2 tablets by mouth every 4 (four) hours as needed.  Marland Kitchen spironolactone (ALDACTONE) 25 MG tablet Take 1 tablet (25 mg total) by mouth daily.  . valsartan-hydrochlorothiazide (DIOVAN-HCT) 320-25 MG tablet Take 1 tablet by mouth daily.    FAMILY HISTORY:  Her indicated that the status of her maternal grandfather is unknown. She indicated that the status of her paternal grandmother is unknown. She indicated that the status of her maternal uncle is unknown.   SOCIAL HISTORY: She  reports that  has never smoked. she has never used smokeless tobacco. She reports that she does not drink alcohol or use drugs.  REVIEW OF SYSTEMS:  POSITIVES IN BOLD See HPI  SUBJECTIVE:  Currently on NRB at 100%- transitioned to 3L [place] at 95% Denies current SOB or CP Complains of slight nausea  Cleviprex at 16 mg/hr  Protonix gtt at 8 mg/hr   VITAL SIGNS: BP 112/63   Pulse 71   Resp (!) 22   Wt 260 lb 12.9 oz (118.3 kg)  SpO2 100%   BMI 39.66 kg/m   HEMODYNAMICS:    VENTILATOR SETTINGS:    INTAKE / OUTPUT: No intake/output data recorded.  PHYSICAL EXAMINATION: General:  Adult AA female sitting upright in ER stretcher in NAD HEENT: MM pale/moist, pupils 3/=, no facial, can not appreciate aphasia  PSY: calm and appropriate  Neuro: Awake, oriented, MAE, no drift CV: rrr, 3/6 SEM PULM: even/non-labored on [place], clear anteriorly- faint inspiratory squeaks, diminished posterior bases with rales  GI: obese, soft, non-tender, bsx4 active  Extremities: warm/dry,  No pitting  edema  Skin: no rashes  LABS:  BMET Recent Labs  Lab 04/17/17 0937 04/17/17 0944  NA 137 141  K 3.9 4.0  CL 107 109  CO2 19*  --   BUN 40* 36*  CREATININE 3.22* 3.40*  GLUCOSE 108* 107*    Electrolytes Recent Labs  Lab 04/17/17 0937  CALCIUM 8.8*    CBC Recent Labs  Lab 04/17/17 0937 04/17/17 0944  WBC 7.4  --   HGB 9.3* 10.5*  HCT 30.7* 31.0*  PLT 257  --     Coag's Recent Labs  Lab 04/17/17 0937  APTT 32  INR 1.08    Sepsis Markers No results for input(s): LATICACIDVEN, PROCALCITON, O2SATVEN in the last 168 hours.  ABG No results for input(s): PHART, PCO2ART, PO2ART in the last 168 hours.  Liver Enzymes Recent Labs  Lab 04/17/17 0937  AST 19  ALT 9*  ALKPHOS 98  BILITOT 0.7  ALBUMIN 3.4*    Cardiac Enzymes No results for input(s): TROPONINI, PROBNP in the last 168 hours.  Glucose Recent Labs  Lab 04/17/17 0938  GLUCAP 111*    Imaging Dg Chest Portable 1 View  Result Date: 04/17/2017 CLINICAL DATA:  Shortness of Breath EXAM: PORTABLE CHEST 1 VIEW COMPARISON:  None. FINDINGS: Cardiomegaly with vascular congestion and bilateral perihilar and lower lobe opacities compatible with edema/CHF. No visible effusions or acute bony abnormality. IMPRESSION: Mild to moderate CHF. Electronically Signed   By: Rolm Baptise M.D.   On: 04/17/2017 11:56   Ct Head Code Stroke Wo Contrast  Result Date: 04/17/2017 CLINICAL DATA:  Code stroke.  Aphasia with blurred vision. EXAM: CT HEAD WITHOUT CONTRAST TECHNIQUE: Contiguous axial images were obtained from the base of the skull through the vertex without intravenous contrast. COMPARISON:  09/22/2012. FINDINGS: Brain: Cortically based areas of hypoattenuation affecting the LEFT posterior parietal and superior occipital lobes, also with involvement of the underlying white matter. No associated hemorrhage. No mass lesion, hydrocephalus, or extra-axial fluid. Normal for age cerebral volume. Vascular: No hyperdense  vessel or unexpected calcification. Skull: Normal. Negative for fracture or focal lesion. Sinuses/Orbits: No acute finding. Other: None. ASPECTS [facility] Stroke Program Early CT Score) - Ganglionic level infarction (caudate, lentiform nuclei, internal capsule, insula, M1-M3 cortex): 7 - Supraganglionic infarction (M4-M6 cortex): 2 Total score (0-10 with 10 being normal): 9 IMPRESSION: 1. Acute nonhemorrhagic infarction affecting the RIGHT occipital cortex and underlying white matter, as described above.Vascular distribution corresponds to the posterior-most M3 division LEFT MCA. 2. ASPECTS is 9. These results were called by telephone at the time of interpretation on 04/17/2017 at 9:51 a.m. to Dr. Leonel Ramsay , who verbally acknowledged these results. Electronically Signed   By: Staci Righter M.D.   On: 04/17/2017 10:05   STUDIES:  1/9 CT head code stroke >> 1. Acute nonhemorrhagic infarction affecting the RIGHT occipital cortex and underlying white matter, as described above.Vascular distribution corresponds to the posterior-most M3 division LEFT MCA.  2. ASPECTS is 9.  MRA head >>   CULTURES: none  ANTIBIOTICS: none  SIGNIFICANT EVENTS: 1/9  Admit, CVA s/p TPA  LINES/TUBES: PIV x 2  DISCUSSION: 37 yoF w/PMH of HTN and murmur presenting with acute aphasia and severe hypertension 270/140, head CT showing old infarct, placed on cleviprex and given TPA with improvement.  Developed abd pain with episode of dark emesis, +occult, and SOB, unable to lay flat for MRA head.   ASSESSMENT / PLAN:  PULMONARY A: Acute hypoxic respiratory failure related to pulmonary edema P:   Supplemental O2 for sats > 94% Consider BiPAP if increased WOB Trend CXR S/p lasix 40mg  x 1  Recommend outpt follow-up for sleep study r/o OSA  CARDIOVASCULAR A:  HTN crisis - suspect pt has very poorly controlled HTN at home, previous 3 med regimen, r/o for RAS previously Acute CVA Elevated troponin Prolonged QTc on  EKG with nonspecific changes   Murmur/ mild MR - last TTE 12/2015 w/ EF 55%, mild LVH, G2DD, trivial AR, mild MR, normal RV size and function - no current CP - previously seen by Dr. Irish Lack in 2017 P:  Tele monitoring cleviprex for SBP < 180 CVA workup - Assess TTE, carotid dopplers, lipid panel Trend troponin and EKG in am  Assess BNP D/c zofran, caution with QT prolonging meds, monitor S/p lasix 40 mg Consider cardiology consult  RENAL A:   AKI - unclear baseline ( 2.25 in 12/2015)  P:   S/p lasix, hold on further fluids given pulmonary edema Add on mag level Trend BMP / urinary output/ daily weight Replace electrolytes as indicated Avoid nephrotoxic agents, ensure adequate renal perfusion  GASTROINTESTINAL A:   Concern for UGIB s/p TPA - dark emesis per patient x 1, + gastric occult P:   NPO Continue protonix gtt for now for 24 hours H/h q 6hr  Consider GI consult   HEMATOLOGIC A:   Anemia- appears chronic  P:  S/p TPA- therefore no further anticoagulation x 24 hr H/H q 6hr   INFECTIOUS A:   No concern for acute process P:   Monitor WBC/ fever curve  ENDOCRINE A:   No acute process  P:   Monitor CBG on BMET Pending HgbA1c Assess TSH  NEUROLOGIC A:   Acute CVA- suspected small cortical infarct s/p TPA with improvement in expressive aphasia  P:   Per Neuro Neuro checks per protocol in ICU PT/OT consult Other testing as above MRI/ MRA when able to lay flat See Cardiology as above   FAMILY  - Updates: Patient and daughter updated on plan of care at the bedside.   - Inter-disciplinary family meet or Palliative Care meeting due by:  04/24/2016  CCT 45 mins  Kennieth Rad, AGACNP-BC [facility] Pgr: [phone] or if no answer [phone] 04/17/2017, 4:52 PM

## 2017-04-18 ENCOUNTER — Encounter (HOSPITAL_COMMUNITY): Payer: Self-pay

## 2017-04-18 ENCOUNTER — Other Ambulatory Visit: Payer: Self-pay

## 2017-04-18 ENCOUNTER — Inpatient Hospital Stay (HOSPITAL_COMMUNITY): Payer: BLUE CROSS/BLUE SHIELD

## 2017-04-18 DIAGNOSIS — I6300 Cerebral infarction due to thrombosis of unspecified precerebral artery: Secondary | ICD-10-CM

## 2017-04-18 DIAGNOSIS — I248 Other forms of acute ischemic heart disease: Secondary | ICD-10-CM

## 2017-04-18 DIAGNOSIS — I361 Nonrheumatic tricuspid (valve) insufficiency: Secondary | ICD-10-CM

## 2017-04-18 DIAGNOSIS — I639 Cerebral infarction, unspecified: Secondary | ICD-10-CM

## 2017-04-18 DIAGNOSIS — I161 Hypertensive emergency: Secondary | ICD-10-CM

## 2017-04-18 LAB — CBC
HEMATOCRIT: 28.5 % — AB (ref 36.0–46.0)
Hemoglobin: 8.5 g/dL — ABNORMAL LOW (ref 12.0–15.0)
MCH: 25.6 pg — ABNORMAL LOW (ref 26.0–34.0)
MCHC: 29.8 g/dL — ABNORMAL LOW (ref 30.0–36.0)
MCV: 85.8 fL (ref 78.0–100.0)
PLATELETS: 267 10*3/uL (ref 150–400)
RBC: 3.32 MIL/uL — ABNORMAL LOW (ref 3.87–5.11)
RDW: 16 % — AB (ref 11.5–15.5)
WBC: 11.5 10*3/uL — AB (ref 4.0–10.5)

## 2017-04-18 LAB — ECHOCARDIOGRAM COMPLETE
CHL CUP MV DEC (S): 197
E decel time: 197 msec
E/e' ratio: 16.77
FS: 24 % — AB (ref 28–44)
HEIGHTINCHES: 67 in
IV/PV OW: 0.95
LA ID, A-P, ES: 56 mm
LA diam index: 2.5 cm/m2
LA vol index: 33.9 mL/m2
LA vol: 76 mL
LAVOLA4C: 67 mL
LDCA: 3.8 cm2
LEFT ATRIUM END SYS DIAM: 56 mm
LV E/e' medial: 16.77
LV E/e'average: 16.77
LV PW d: 10.9 mm — AB (ref 0.6–1.1)
LV TDI E'LATERAL: 9.36
LVELAT: 9.36 cm/s
LVOTD: 22 mm
MV pk A vel: 90.3 m/s
MV pk E vel: 157 m/s
MVPG: 10 mmHg
RV LATERAL S' VELOCITY: 12.9 cm/s
RV TAPSE: 21.4 mm
TDI e' medial: 5.66
WEIGHTICAEL: 4067.05 [oz_av]

## 2017-04-18 LAB — BRAIN NATRIURETIC PEPTIDE: B Natriuretic Peptide: 1262.2 pg/mL — ABNORMAL HIGH (ref 0.0–100.0)

## 2017-04-18 LAB — LIPID PANEL
Cholesterol: 185 mg/dL (ref 0–200)
HDL: 41 mg/dL (ref >40)
LDL CALC: 129 mg/dL — AB (ref 0–99)
TRIGLYCERIDES: 73 mg/dL (ref <150)
Total CHOL/HDL Ratio: 4.5 RATIO
VLDL: 15 mg/dL (ref 0–40)

## 2017-04-18 LAB — HIV ANTIBODY (ROUTINE TESTING W REFLEX): HIV SCREEN 4TH GENERATION: NONREACTIVE

## 2017-04-18 LAB — HEMOGLOBIN A1C
HEMOGLOBIN A1C: 5.2 % (ref 4.8–5.6)
MEAN PLASMA GLUCOSE: 102.54 mg/dL

## 2017-04-18 LAB — TROPONIN I: Troponin I: 0.27 ng/mL (ref <0.03)

## 2017-04-18 LAB — HEMOGLOBIN AND HEMATOCRIT, BLOOD
HCT: 27.3 % — ABNORMAL LOW (ref 36.0–46.0)
HCT: 29.8 % — ABNORMAL LOW (ref 36.0–46.0)
HEMOGLOBIN: 8 g/dL — AB (ref 12.0–15.0)
HEMOGLOBIN: 8.9 g/dL — AB (ref 12.0–15.0)

## 2017-04-18 LAB — MAGNESIUM: MAGNESIUM: 1.9 mg/dL (ref 1.7–2.4)

## 2017-04-18 LAB — MRSA PCR SCREENING: MRSA by PCR: NEGATIVE

## 2017-04-18 MED ORDER — AMLODIPINE BESYLATE 10 MG PO TABS
10.0000 mg | ORAL_TABLET | Freq: Every day | ORAL | Status: DC
  Administered 2017-04-18 – 2017-04-23 (×5): 10 mg via ORAL
  Filled 2017-04-18 (×5): qty 1

## 2017-04-18 MED ORDER — ALPRAZOLAM 0.25 MG PO TABS: 0.2500 mg | ORAL_TABLET | Freq: Three times a day (TID) | ORAL | Status: DC | PRN

## 2017-04-18 MED ORDER — CARVEDILOL 6.25 MG PO TABS
6.2500 mg | ORAL_TABLET | Freq: Two times a day (BID) | ORAL | Status: DC
  Administered 2017-04-18 – 2017-04-22 (×8): 6.25 mg via ORAL
  Filled 2017-04-18 (×6): qty 1
  Filled 2017-04-18: qty 2
  Filled 2017-04-18: qty 1

## 2017-04-18 MED ORDER — SODIUM CHLORIDE 0.9 % IV SOLN
1.0000 g | Freq: Once | INTRAVENOUS | Status: AC
  Administered 2017-04-18: 1 g via INTRAVENOUS
  Filled 2017-04-18: qty 10

## 2017-04-18 MED ORDER — ATORVASTATIN CALCIUM 40 MG PO TABS
40.0000 mg | ORAL_TABLET | Freq: Every day | ORAL | Status: DC
  Administered 2017-04-19 – 2017-04-22 (×3): 40 mg via ORAL
  Filled 2017-04-18 (×3): qty 1

## 2017-04-18 MED ORDER — ALPRAZOLAM 0.25 MG PO TABS
0.2500 mg | ORAL_TABLET | Freq: Three times a day (TID) | ORAL | Status: DC | PRN
Start: 2017-04-18 — End: 2017-04-18

## 2017-04-18 MED ORDER — HYDRALAZINE HCL 25 MG PO TABS
25.0000 mg | ORAL_TABLET | Freq: Three times a day (TID) | ORAL | Status: DC
  Administered 2017-04-18 – 2017-04-19 (×5): 25 mg via ORAL
  Filled 2017-04-18 (×5): qty 1

## 2017-04-18 MED ORDER — LABETALOL HCL 5 MG/ML IV SOLN
5.0000 mg | INTRAVENOUS | Status: DC | PRN
  Administered 2017-04-18 – 2017-04-19 (×5): 5 mg via INTRAVENOUS
  Filled 2017-04-18 (×5): qty 4

## 2017-04-18 MED ORDER — LORAZEPAM 2 MG/ML IJ SOLN
1.0000 mg | Freq: Once | INTRAMUSCULAR | Status: AC
  Administered 2017-04-18: 2 mg via INTRAVENOUS
  Filled 2017-04-18: qty 1

## 2017-04-18 NOTE — Consult Note (Signed)
Cardiology Consultation:   Patient ID: Erin Dean; [id number]; April 20, 1965   Admit date: 04/17/2017 Date of Consult: 04/18/2017  Primary Care Provider: Lois Huxley, PA Primary Cardiologist: Larae Grooms, MD  Primary Electrophysiologist:  NA   Patient Profile:   Erin Dean is a 52 y.o. female with a hx of HTN which is difficult to control at times, obesity, systolic murmur, compliance who is being seen today for the evaluation of elevated troponins after admit 04/17/17 for acute CVA at the request of Dr. Leonie Man.  History of Present Illness:   Erin Dean a hx of HTN which is difficult to control at times, obesity, systolic murmur, compliance with echo in 2017 with normal LV function, mild LVH, G2DD, mild MR.   She is now admitted with CVA - yesterday at 0530 she began having difficulty speaking - code stroke called and TPA given.  She did improve with the IV TPA.     Her troponin was elevated thought to be from CVA.  Her BP on arrival to ER yesterday was 270/140 treated with labetalol.  She did develop SOB and abd pain and vomiting.  Occult + placed on IV protonix.   She was hypoxic and found to have pulmonary edema given IV lasix.  Today MRI with acute infarct in Lt posterior M3 branch distribution - some swelling but no hemorrhage.   EKG:  The EKG was personally reviewed and demonstrates:  SR with Q waves in II, III, AVF old, LVH Today's EKG SR with new T wave inversions in ant lat leads Telemetry:  Telemetry was personally reviewed and demonstrates:  SR occ PVCs  Troponin 0.22; 0.29; 0.27  BNP 1262 Cr 3.22 on admit (Cr 2.25 12/23/15) BUN 40  Hgb 9.3 on admit and now 8.0  CXR yesterday with HF  ECHO today with EF 60-65%, wall thickness was normal, mild AR, mild MR, LA moderately dilated and RA mildly dilated.   Currently no chest pain or SOB sleeping on her back flat.  She tells me she has not recently had any chest pain or SOB prior to this admit.  She has been  feeling well until yesterday.   Plans for TEE tomorrow as well.   Past Medical History:  Diagnosis Date  . Hypertension     Past Surgical History:  Procedure Laterality Date  . CESAREAN SECTION       Home Medications:  Prior to Admission medications   Medication Sig Start Date End Date Taking? Authorizing Provider  carvedilol (COREG) 6.25 MG tablet Take 1 tablet (6.25 mg total) by mouth 2 (two) times daily. 12/15/15  Yes Jettie Booze, MD  escitalopram (LEXAPRO) 10 MG tablet Take 10 mg by mouth daily.   Yes [provider]  ferrous sulfate 325 (65 FE) MG tablet Take 325 mg by mouth 2 (two) times daily with a meal.    Yes [provider]  ALPRAZolam (XANAX) 0.5 MG tablet Take 0.25-0.5 mg by mouth 3 (three) times daily as needed (anxiety). Pt has not taken yet    [provider]  amLODipine (NORVASC) 10 MG tablet Take 1 tablet (10 mg total) by mouth daily. 12/01/15 02/29/16  Jettie Booze, MD  hydrALAZINE (APRESOLINE) 25 MG tablet Take 1 tablet (25 mg total) by mouth 3 (three) times daily. 12/26/15 12/20/16  Jettie Booze, MD  HYDROcodone-acetaminophen (NORCO/VICODIN) 5-325 MG tablet Take 2 tablets by mouth every 4 (four) hours as needed. 08/04/16   Fransico Meadow, PA-C  spironolactone (ALDACTONE) 25 MG tablet Take 1 tablet (25 mg total) by mouth daily. 12/16/15 12/10/16  Jettie Booze, MD  valsartan-hydrochlorothiazide (DIOVAN-HCT) 320-25 MG tablet Take 1 tablet by mouth daily. 12/23/15   Burnell Blanks, MD    Inpatient Medications: Scheduled Meds: .  stroke: mapping our early stages of recovery book   Does not apply Once  . acetaminophen  650 mg Oral Once  . amLODipine  10 mg Oral Daily  . carvedilol  6.25 mg Oral BID  . hydrALAZINE  25 mg Oral TID  . pantoprazole (PROTONIX) IV  40 mg Intravenous QHS   Continuous Infusions: . calcium gluconate    . clevidipine Stopped (04/18/17 1100)   PRN Meds: acetaminophen **OR**  acetaminophen (TYLENOL) oral liquid 160 mg/5 mL **OR** acetaminophen, ALPRAZolam, labetalol  Allergies:   No Known Allergies  Social History:   Social History   Socioeconomic History  . Marital status: Legally Separated    Spouse name: Not on file  . Number of children: Not on file  . Years of education: Not on file  . Highest education level: Not on file  Social Needs  . Financial resource strain: Not on file  . Food insecurity - worry: Not on file  . Food insecurity - inability: Not on file  . Transportation needs - medical: Not on file  . Transportation needs - non-medical: Not on file  Occupational History  . Not on file  Tobacco Use  . Smoking status: Never Smoker  . Smokeless tobacco: Never Used  Substance and Sexual Activity  . Alcohol use: No  . Drug use: No  . Sexual activity: Not on file  Other Topics Concern  . Not on file  Social History Narrative  . Not on file    Family History:    Family History  Problem Relation Age of Onset  . Heart attack Maternal Uncle   . Heart attack Maternal Grandfather   . Stroke Paternal Grandmother      ROS:  Please see the history of present illness.  ROS  General:no colds or fevers, no weight changes Skin:no rashes or ulcers HEENT:no blurred vision, no congestion CV:see HPI PUL:see HPI GI:no diarrhea constipation or melena, no indigestion GU:no hematuria, no dysuria MS:no joint pain, no claudication Neuro:no syncope, no lightheadedness Endo:no diabetes, no thyroid disease   Physical Exam/Data:   Vitals:   04/18/17 1015 04/18/17 1029 04/18/17 1100 04/18/17 1200  BP: (!) 170/70 (!) 170/70 (!) 157/71 (!) 174/77  Pulse: 77  69 72  Resp: (!) 26  (!) 26 (!) 22  Temp:      TempSrc:      SpO2: 98% 96% 99% 100%  Weight:      Height:        Intake/Output Summary (Last 24 hours) at 04/18/2017 1348 Last data filed at 04/17/2017 1940 Gross per 24 hour  Intake 100 ml  Output 1900 ml  Net -1800 ml   Filed Weights     04/17/17 0900 04/17/17 2315  Weight: 260 lb 12.9 oz (118.3 kg) 254 lb 3.1 oz (115.3 kg)   Body mass index is 39.81 kg/m.  General:  Well nourished, well developed, in no acute distress, sleepy currently  HEENT: normal Lymph: no adenopathy Neck: no JVD Endocrine:  No thryomegaly Vascular: No carotid bruits; 2+ pedal pulses bil, no pitting edema Cardiac:  normal S1, S2; RRR; no murmur gallup rub or click Lungs:  clear to auscultation bilaterally, no wheezing, rhonchi or rales  Abd: soft, nontender, no hepatomegaly  Ext: no edema Musculoskeletal:  No deformities, BUE and BLE strength normal and equal Skin: warm and dry  Neuro:  A&O X 3 MAE follows commands speech is normal, no focal abnormalities noted Psych:  Normal affect    Relevant CV Studies: 04/18/17 Echo Study Conclusions  - Left ventricle: The cavity size was normal. Wall thickness was   normal. Systolic function was normal. The estimated ejection   fraction was in the range of 60% to 65%. - Aortic valve: There was mild regurgitation. - Mitral valve: There was mild regurgitation. - Left atrium: The atrium was moderately dilated. - Right atrium: The atrium was mildly dilated. - Atrial septum: No defect or patent foramen ovale was identified.  Laboratory Data:  Chemistry Recent Labs  Lab 04/17/17 0937 04/17/17 0944  NA 137 141  K 3.9 4.0  CL 107 109  CO2 19*  --   GLUCOSE 108* 107*  BUN 40* 36*  CREATININE 3.22* 3.40*  CALCIUM 8.8*  --   GFRNONAA 15*  --   GFRAA 18*  --   ANIONGAP 11  --     Recent Labs  Lab 04/17/17 0937  PROT 8.2*  ALBUMIN 3.4*  AST 19  ALT 9*  ALKPHOS 98  BILITOT 0.7   Hematology Recent Labs  Lab 04/17/17 0937 04/17/17 0944 04/17/17 1712 04/18/17 0132  WBC 7.4  --   --  11.5*  RBC 3.63*  --   --  3.32*  HGB 9.3* 10.5* 9.5* 8.5*  HCT 30.7* 31.0* 31.1* 28.5*  MCV 84.6  --   --  85.8  MCH 25.6*  --   --  25.6*  MCHC 30.3  --   --  29.8*  RDW 15.9*  --   --  16.0*   PLT 257  --   --  267   Cardiac Enzymes Recent Labs  Lab 04/17/17 1712 04/18/17 0132  TROPONINI 0.29* 0.27*    Recent Labs  Lab 04/17/17 0942  TROPIPOC 0.22*    BNP Recent Labs  Lab 04/18/17 0132  BNP 1,262.2*    DDimer No results for input(s): DDIMER in the last 168 hours.  Radiology/Studies:  Mr Virgel Paling ZO Contrast  Result Date: 04/18/2017 CLINICAL DATA:  Acute development of speech disturbance beginning yesterday. EXAM: MRI HEAD WITHOUT CONTRAST MRA HEAD WITHOUT CONTRAST TECHNIQUE: Multiplanar, multiecho pulse sequences of the brain and surrounding structures were obtained without intravenous contrast. Angiographic images of the head were obtained using MRA technique without contrast. COMPARISON:  CT 04/17/2017 FINDINGS: MRI HEAD FINDINGS Brain: 5 cm region of acute infarction affecting the left temporoparietal junction consistent with left M3 branch occlusion infarction. Mild swelling. No hemorrhage or mass effect. The remainder the brain shows some chronic small-vessel ischemic changes of the hemispheric white matter on each side. No other large vessel territory infarction. No mass, hydrocephalus or extra-axial collection. Vascular: Major vessels at the base of the brain show flow. Skull and upper cervical spine: Negative Sinuses/Orbits: Clear/normal Other: None MRA HEAD FINDINGS Both internal carotid arteries are widely patent through the skull base and siphon regions. The anterior and middle cerebral vessels are patent without proximal stenosis, aneurysm or vascular malformation. There is probably a missing M3 branch posteriorly on the left. Left vertebral artery is dominant and widely patent to the basilar. The right vertebral artery is a small irregular vessel. There is probably acquired stenosis. No basilar stenosis. Superior cerebellar and posterior cerebral arteries appear normal. IMPRESSION: Acute  infarction in a left posterior M3 branch distribution affecting the  temporoparietal junction, measuring about 5 cm in size. Swelling but no hemorrhage or mass effect. Mild chronic small-vessel ischemic changes elsewhere within the hemispheric white matter. No large vessel anterior circulation finding. Probable missing posterior M3 branch on the left. Stenotic distal right vertebral artery. Electronically Signed   By: Nelson Chimes M.D.   On: 04/18/2017 12:32   Mr Brain Wo Contrast  Result Date: 04/18/2017 CLINICAL DATA:  Acute development of speech disturbance beginning yesterday. EXAM: MRI HEAD WITHOUT CONTRAST MRA HEAD WITHOUT CONTRAST TECHNIQUE: Multiplanar, multiecho pulse sequences of the brain and surrounding structures were obtained without intravenous contrast. Angiographic images of the head were obtained using MRA technique without contrast. COMPARISON:  CT 04/17/2017 FINDINGS: MRI HEAD FINDINGS Brain: 5 cm region of acute infarction affecting the left temporoparietal junction consistent with left M3 branch occlusion infarction. Mild swelling. No hemorrhage or mass effect. The remainder the brain shows some chronic small-vessel ischemic changes of the hemispheric white matter on each side. No other large vessel territory infarction. No mass, hydrocephalus or extra-axial collection. Vascular: Major vessels at the base of the brain show flow. Skull and upper cervical spine: Negative Sinuses/Orbits: Clear/normal Other: None MRA HEAD FINDINGS Both internal carotid arteries are widely patent through the skull base and siphon regions. The anterior and middle cerebral vessels are patent without proximal stenosis, aneurysm or vascular malformation. There is probably a missing M3 branch posteriorly on the left. Left vertebral artery is dominant and widely patent to the basilar. The right vertebral artery is a small irregular vessel. There is probably acquired stenosis. No basilar stenosis. Superior cerebellar and posterior cerebral arteries appear normal. IMPRESSION: Acute  infarction in a left posterior M3 branch distribution affecting the temporoparietal junction, measuring about 5 cm in size. Swelling but no hemorrhage or mass effect. Mild chronic small-vessel ischemic changes elsewhere within the hemispheric white matter. No large vessel anterior circulation finding. Probable missing posterior M3 branch on the left. Stenotic distal right vertebral artery. Electronically Signed   By: Nelson Chimes M.D.   On: 04/18/2017 12:32   Dg Chest Portable 1 View  Result Date: 04/17/2017 CLINICAL DATA:  Shortness of Breath EXAM: PORTABLE CHEST 1 VIEW COMPARISON:  None. FINDINGS: Cardiomegaly with vascular congestion and bilateral perihilar and lower lobe opacities compatible with edema/CHF. No visible effusions or acute bony abnormality. IMPRESSION: Mild to moderate CHF. Electronically Signed   By: Rolm Baptise M.D.   On: 04/17/2017 11:56   Ct Head Code Stroke Wo Contrast  Result Date: 04/17/2017 CLINICAL DATA:  Code stroke.  Aphasia with blurred vision. EXAM: CT HEAD WITHOUT CONTRAST TECHNIQUE: Contiguous axial images were obtained from the base of the skull through the vertex without intravenous contrast. COMPARISON:  09/22/2012. FINDINGS: Brain: Cortically based areas of hypoattenuation affecting the LEFT posterior parietal and superior occipital lobes, also with involvement of the underlying white matter. No associated hemorrhage. No mass lesion, hydrocephalus, or extra-axial fluid. Normal for age cerebral volume. Vascular: No hyperdense vessel or unexpected calcification. Skull: Normal. Negative for fracture or focal lesion. Sinuses/Orbits: No acute finding. Other: None. ASPECTS [facility] Stroke Program Early CT Score) - Ganglionic level infarction (caudate, lentiform nuclei, internal capsule, insula, M1-M3 cortex): 7 - Supraganglionic infarction (M4-M6 cortex): 2 Total score (0-10 with 10 being normal): 9 IMPRESSION: 1. Acute nonhemorrhagic infarction affecting the RIGHT occipital  cortex and underlying white matter, as described above.Vascular distribution corresponds to the posterior-most M3 division LEFT MCA. 2. ASPECTS  is 9. These results were called by telephone at the time of interpretation on 04/17/2017 at 9:51 a.m. to Dr. Leonel Ramsay , who verbally acknowledged these results. Electronically Signed   By: Staci Righter M.D.   On: 04/17/2017 10:05    Assessment and Plan:   1. Elevated Troponin, but flat, may be from CVA alone -no prior hx of CAD EKG with some abnormalities today  EF is normal  Dr. Oval Linsey to see 2. 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 1900 today 3. Acute CVA with HTN and non compliance in the past.  Sever malignant htn on admit  Neuro following 4. HTN elevated, would increase hydralazine - will leave to Neuro  Plan for TEE tomorrow     [facility] has been requested to perform a transesophageal echocardiogram on [name] for CVA.  After careful review of history and examination, the risks and benefits of transesophageal echocardiogram have been explained including risks of esophageal damage, perforation (1:10,000 risk), bleeding, pharyngeal hematoma as well as other potential complications associated with conscious sedation including aspiration, arrhythmia, respiratory failure and death. Alternatives to treatment were discussed, questions were answered. Patient is willing to proceed.    For questions or updates, please contact [place] Please consult [email or web address] for contact info under Cardiology/STEMI.   Signed, Cecilie Kicks, NP  04/18/2017 1:48 PM

## 2017-04-18 NOTE — Progress Notes (Signed)
OT Evaluation  PTA, pt lived alone, was independent with ADL and mobility, drove and worked as a Air traffic controller at a [employer]. Pt with apparent deficits as listed below impacting her ability to complete IADL tasks, drive and return to work. Recommend pt follow up with neuro outpt OT to facilitate return to PLOF. Pt tearful at end of session when she realized how she was having difficulty with "stuff I should know and do all the time". Pt safe to DC home with intermittent S when medically stable. Recommend pt refrain from driving at this time.    04/18/17 1057  OT Visit Information  Last OT Received On 04/18/17  Assistance Needed +1  History of Present Illness 52 year old left handed female who presented with aphasia, blurred vision, and headache ischemic infarct LEFT posterior parietal and superior occipital lobes. 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"; also does computer work  [employer] No difficulties  Pain Assessment  Pain Assessment No/denies pain  Cognition  Arousal/Alertness Awake/alert  Behavior During Therapy Flat affect  Overall Cognitive Status Impaired/Different from baseline  Area of Impairment Orientation;Memory;Safety/judgement;Problem solving  Orientation Level Disoriented to;Time  Current Attention Level Selective  Memory Decreased short-term memory  Safety/Judgement Decreased  awareness of deficits  Problem Solving Slow processing  General Comments pt with difficulty recalling where she works, year, unable to remember room number, difficulty recalling what she does for work, startled by presence on right; able to write name and address but demonstrated difficulty with spelling  Upper Extremity Assessment  Upper Extremity Assessment Overall WFL for tasks assessed  Lower Extremity Assessment  Lower Extremity Assessment Defer to PT evaluation  LLE Deficits / Details hip flexion 4/5, knee flexion and extension 5/5  Cervical / Trunk Assessment  Cervical / Trunk Assessment Normal  ADL  Overall ADL's  Needs assistance/impaired  Functional mobility during ADLs Supervision/safety  General ADL Comments Pt overall set up for ADL tasks. IADL tasks are impacted, including financial managemetn; reading; Best boy. Will further assess  Vision- History  Baseline Vision/History Wears glasses  Wears Glasses At all times  Patient Visual Report Blurring of vision  Vision- Assessment  Vision Assessment? Vision impaired- to be further tested in functional context  Additional Comments Pt able to track all quadrants. Pt with possible imparied R lower quadreant, Will further assess  Perception  Comments will further assess  Praxis  Praxis tested? WFL  Bed Mobility  Overal bed mobility Modified Independent  Transfers  Overall transfer level Modified independent  Balance  Overall balance assessment No apparent balance deficits (not formally assessed)  OT - End of Session  Equipment Utilized During Treatment Gait belt  Activity Tolerance Patient tolerated treatment well  Patient left in chair;with call bell/phone within reach;with chair alarm set  Nurse Communication Mobility status  OT Assessment  OT Recommendation/Assessment Patient needs continued OT Services  OT Visit Diagnosis Low vision, both eyes (H54.2);Other symptoms and signs involving cognitive function   OT  Problem List Impaired vision/perception;Decreased cognition  OT Plan  OT Frequency (ACUTE ONLY) Min 2X/week  OT Treatment/Interventions (ACUTE ONLY) Self-care/ADL training;Therapeutic activities;Cognitive remediation/compensation;Visual/perceptual remediation/compensation;Patient/family education  AM-PAC OT "6 Clicks" Daily Activity Outcome Measure  Help from another person eating meals? 4  Help from another person taking care of personal grooming? 4  Help from another person toileting, which includes using toliet, bedpan, or urinal? 4  Help from another person bathing (including washing, rinsing, drying)? 4  Help from another person to put on and taking off regular upper body clothing? 4  Help from another person to put on and taking off regular lower body clothing? 4  6 Click Score 24  ADL G Code Conversion CH  OT Recommendation  Follow Up Recommendations Outpatient OT;Supervision - Intermittent (Neuro outpt OT)  OT Equipment None recommended by OT  Individuals Consulted  Consulted and Agree with Results and Recommendations Patient  Acute Rehab OT Goals  Patient Stated Goal to return to being normal  OT Goal Formulation With patient  Time For Goal Achievement 05/02/17  Potential to Achieve Goals Good  OT Time Calculation  OT Start Time (ACUTE ONLY) 1020  OT Stop Time (ACUTE ONLY) 1036  OT Time Calculation (min) 16 min  OT General Charges  $OT Visit 1 Visit  OT Evaluation  $OT Eval Moderate Complexity 1 Mod  Written Expression  Dominant Hand Left  [facility], OT/L  [phone] 04/18/2017

## 2017-04-18 NOTE — Progress Notes (Signed)
PULMONARY / CRITICAL CARE MEDICINE   Name: Erin Dean MRN: [id number] DOB: [birth year]    ADMISSION DATE:  04/17/2017 CONSULTATION DATE:  04/17/2017  REFERRING MD:  Dr. Katherine Roan   CHIEF COMPLAINT:  CVA/ ? UGIB/ AKI/ +troponin  HISTORY OF PRESENT ILLNESS:   52 year old left handed female with PMH significant for hypertension and heart murmur who presented with aphasia, blurred vision, and headache. 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  In the ER, she arrived as a code stroke.  Noted to be hypertensive 270/140.  NIHSS 4 with severe expressive aphasia.  Treated with labetalol and placed on cleviprex gtt.  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  Given full dose TPA at 0955 with marked improvment.  CTA deferred due to AKI.  MRA attempted however patient could not lay flat due to shortness of breath and therefore aborted. She then complained of abdominal pain and had small volume emesis, dark per patient;  occult positive.  Placed on Protonix drip and PCCM consulted for medical management.  Denies history of smoking, alcohol, or illicit drug abuse.  Her hypertension is managed by her primary physician at [facility].   - Interim events: no acute events. Patient just returned from MRI. Echo was done this am. Interpretation pending. On heart healthy diet.   No Known Allergies  No current facility-administered medications on file prior to encounter.    Current Outpatient Medications on File Prior to Encounter  Medication Sig  . carvedilol (COREG) 6.25 MG tablet Take 1 tablet (6.25 mg total) by mouth 2 (two) times daily.  Marland Kitchen escitalopram (LEXAPRO) 10 MG tablet Take 10 mg by mouth daily.  . ferrous sulfate 325 (65 FE) MG tablet Take 325 mg by mouth 2 (two) times daily with a meal.   . ALPRAZolam (XANAX) 0.5 MG tablet Take 0.25-0.5 mg by mouth 3 (three) times daily as needed (anxiety). Pt has not  taken yet  . amLODipine (NORVASC) 10 MG tablet Take 1 tablet (10 mg total) by mouth daily.  . hydrALAZINE (APRESOLINE) 25 MG tablet Take 1 tablet (25 mg total) by mouth 3 (three) times daily.  Marland Kitchen HYDROcodone-acetaminophen (NORCO/VICODIN) 5-325 MG tablet Take 2 tablets by mouth every 4 (four) hours as needed.  Marland Kitchen spironolactone (ALDACTONE) 25 MG tablet Take 1 tablet (25 mg total) by mouth daily.  . valsartan-hydrochlorothiazide (DIOVAN-HCT) 320-25 MG tablet Take 1 tablet by mouth daily.    SUBJECTIVE:  On room air. Denies current SOB or CP. Denies nausea. No emesis. Off Cleviprex. Off Protonix gtt.  VITAL SIGNS: BP (!) 174/77   Pulse 72   Temp 97.8 F (36.6 C) (Oral)   Resp (!) 22   Ht 5\' 7"  (1.702 m)   Wt 115.3 kg (254 lb 3.1 oz)   SpO2 100%   BMI 39.81 kg/m   HEMODYNAMICS:    VENTILATOR SETTINGS:    INTAKE / OUTPUT: I/O last 3 completed shifts: In: 100 [IV Piggyback:100] Out: 1900 [Urine:1900]  PHYSICAL EXAMINATION: General:  No acute distress. Awake, alert. Follows commands.  HEENT: MM pale/moist, pupils 3/=, no facial, no aphasia  PSY: calm and appropriate  Neuro: Awake, oriented, MAE, no drift CV: rrr, 3/6 SEM PULM: even/non-labored on [place], clear anteriorly- faint inspiratory squeaks, diminished posterior bases with rales  GI: obese, soft, non-tender, bsx4 active  Extremities: warm/dry,  No pitting edema  Skin: no rashes  LABS:  BMET  Recent Labs  Lab 04/17/17 0937 04/17/17 0944  NA 137 141  K 3.9 4.0  CL 107 109  CO2 19*  --   BUN 40* 36*  CREATININE 3.22* 3.40*  GLUCOSE 108* 107*    Electrolytes Recent Labs  Lab 04/17/17 0937 04/17/17 1648  CALCIUM 8.8*  --   MG  --  1.9    CBC Recent Labs  Lab 04/17/17 0937 04/17/17 0944 04/17/17 1712 04/18/17 0132  WBC 7.4  --   --  11.5*  HGB 9.3* 10.5* 9.5* 8.5*  HCT 30.7* 31.0* 31.1* 28.5*  PLT 257  --   --  267    Coag's Recent Labs  Lab 04/17/17 0937  APTT 32  INR 1.08    Sepsis  Markers No results for input(s): LATICACIDVEN, PROCALCITON, O2SATVEN in the last 168 hours.  ABG No results for input(s): PHART, PCO2ART, PO2ART in the last 168 hours.  Liver Enzymes Recent Labs  Lab 04/17/17 0937  AST 19  ALT 9*  ALKPHOS 98  BILITOT 0.7  ALBUMIN 3.4*    Cardiac Enzymes Recent Labs  Lab 04/17/17 1712 04/18/17 0132  TROPONINI 0.29* 0.27*    Glucose Recent Labs  Lab 04/17/17 0938  GLUCAP 111*    Imaging Mr Jodene Nam Head Wo Contrast  Result Date: 04/18/2017 CLINICAL DATA:  Acute development of speech disturbance beginning yesterday. EXAM: MRI HEAD WITHOUT CONTRAST MRA HEAD WITHOUT CONTRAST TECHNIQUE: Multiplanar, multiecho pulse sequences of the brain and surrounding structures were obtained without intravenous contrast. Angiographic images of the head were obtained using MRA technique without contrast. COMPARISON:  CT 04/17/2017 FINDINGS: MRI HEAD FINDINGS Brain: 5 cm region of acute infarction affecting the left temporoparietal junction consistent with left M3 branch occlusion infarction. Mild swelling. No hemorrhage or mass effect. The remainder the brain shows some chronic small-vessel ischemic changes of the hemispheric white matter on each side. No other large vessel territory infarction. No mass, hydrocephalus or extra-axial collection. Vascular: Major vessels at the base of the brain show flow. Skull and upper cervical spine: Negative Sinuses/Orbits: Clear/normal Other: None MRA HEAD FINDINGS Both internal carotid arteries are widely patent through the skull base and siphon regions. The anterior and middle cerebral vessels are patent without proximal stenosis, aneurysm or vascular malformation. There is probably a missing M3 branch posteriorly on the left. Left vertebral artery is dominant and widely patent to the basilar. The right vertebral artery is a small irregular vessel. There is probably acquired stenosis. No basilar stenosis. Superior cerebellar and  posterior cerebral arteries appear normal. IMPRESSION: Acute infarction in a left posterior M3 branch distribution affecting the temporoparietal junction, measuring about 5 cm in size. Swelling but no hemorrhage or mass effect. Mild chronic small-vessel ischemic changes elsewhere within the hemispheric white matter. No large vessel anterior circulation finding. Probable missing posterior M3 branch on the left. Stenotic distal right vertebral artery. Electronically Signed   By: Nelson Chimes M.D.   On: 04/18/2017 12:32   Mr Brain Wo Contrast  Result Date: 04/18/2017 CLINICAL DATA:  Acute development of speech disturbance beginning yesterday. EXAM: MRI HEAD WITHOUT CONTRAST MRA HEAD WITHOUT CONTRAST TECHNIQUE: Multiplanar, multiecho pulse sequences of the brain and surrounding structures were obtained without intravenous contrast. Angiographic images of the head were obtained using MRA technique without contrast. COMPARISON:  CT 04/17/2017 FINDINGS: MRI HEAD FINDINGS Brain: 5 cm region of acute infarction affecting the left temporoparietal junction consistent with left M3 branch occlusion infarction. Mild swelling. No hemorrhage or mass effect. The  remainder the brain shows some chronic small-vessel ischemic changes of the hemispheric white matter on each side. No other large vessel territory infarction. No mass, hydrocephalus or extra-axial collection. Vascular: Major vessels at the base of the brain show flow. Skull and upper cervical spine: Negative Sinuses/Orbits: Clear/normal Other: None MRA HEAD FINDINGS Both internal carotid arteries are widely patent through the skull base and siphon regions. The anterior and middle cerebral vessels are patent without proximal stenosis, aneurysm or vascular malformation. There is probably a missing M3 branch posteriorly on the left. Left vertebral artery is dominant and widely patent to the basilar. The right vertebral artery is a small irregular vessel. There is probably  acquired stenosis. No basilar stenosis. Superior cerebellar and posterior cerebral arteries appear normal. IMPRESSION: Acute infarction in a left posterior M3 branch distribution affecting the temporoparietal junction, measuring about 5 cm in size. Swelling but no hemorrhage or mass effect. Mild chronic small-vessel ischemic changes elsewhere within the hemispheric white matter. No large vessel anterior circulation finding. Probable missing posterior M3 branch on the left. Stenotic distal right vertebral artery. Electronically Signed   By: Nelson Chimes M.D.   On: 04/18/2017 12:32   STUDIES:  1/9 CT head code stroke >> 1. Acute nonhemorrhagic infarction affecting the RIGHT occipital cortex and underlying white matter, as described above.Vascular distribution corresponds to the posterior-most M3 division LEFT MCA. 2. ASPECTS is 9.  MRA head >>   CULTURES: none  ANTIBIOTICS: none  SIGNIFICANT EVENTS: 1/9  Admit, CVA s/p TPA  LINES/TUBES: PIV x 2  DISCUSSION: 30 yoF w/PMH of HTN and murmur presenting with acute aphasia and severe hypertension 270/140, head CT showing old infarct, placed on cleviprex and given TPA with improvement.  Developed abd pain with episode of dark emesis, +occult, and SOB, unable to lay flat for MRA head.   ASSESSMENT / PLAN:  PULMONARY A: Acute hypoxic respiratory failure related to pulmonary edema P: Supplemental O2 for sats > 94% Consider BiPAP if increased WOB Trend CXR S/p lasix 40mg  x 1  Recommend outpt follow-up for sleep study r/o OSA  CARDIOVASCULAR A: HTN crisis - suspect pt has very poorly controlled HTN at home, previous 3 med regimen, r/o for RAS previously Acute CVA Elevated troponin Prolonged QTc on EKG with nonspecific changes   Murmur/ mild MR - last TTE 12/2015 w/ EF 55%, mild LVH, G2DD, trivial AR, mild MR, normal RV size and function - no current CP - previously seen by Dr. Irish Lack in 2017 P: Tele monitoring cleviprex for SBP <  180 CVA workup - Assess TTE, carotid dopplers, lipid panel Trend troponin and EKG in am  Assess BNP D/c zofran, caution with QT prolonging meds, monitor S/p lasix 40 mg Consider cardiology consult  RENAL A: AKI - unclear baseline ( 2.25 in 12/2015)  P: S/p lasix, hold on further fluids given pulmonary edema Add on mag level Trend BMP / urinary output/ daily weight Replace electrolytes as indicated Avoid nephrotoxic agents, ensure adequate renal perfusion  GASTROINTESTINAL A: Concern for UGIB s/p TPA - dark emesis per patient x 1, + gastric occult P: NPO Continue protonix gtt for now for 24 hours H/h q 6hr  Consider GI consult   HEMATOLOGIC A: Anemia- appears chronic  P: S/p TPA- therefore no further anticoagulation x 24 hr H/H q 6hr   INFECTIOUS A: No concern for acute process P: Monitor WBC/ fever curve  ENDOCRINE A: No acute process  P: Monitor CBG on BMET Pending HgbA1c Assess TSH  NEUROLOGIC A: Acute CVA- suspected small cortical infarct s/p TPA with improvement in expressive aphasia  P: Per Neuro Neuro checks per protocol in ICU PT/OT consult Other testing as above MRI/ MRA when able to lay flat See Cardiology as above   FAMILY  - Updates: Patient and daughter updated on plan of care at the bedside.   - Inter-disciplinary family meet or Palliative Care meeting due by:  04/24/2016   Renee Pain, MD  [place] Pgr: [phone] or if no answer [phone] 04/18/2017, 12:57 PM

## 2017-04-18 NOTE — Progress Notes (Signed)
NEUROHOSPITALISTS STROKE TEAM - DAILY PROGRESS NOTE   ADMISSION HISTORY: Erin Dean is a 52 y.o. female who awoke in her normal state of health and was at work at 5:30 AM when she developed difficulty speaking.  She states that it was an abrupt change in that she was normal before this.  She denies numbness or weakness.  On arrival, she had a severe expressive aphasia, but was able to answer yes/no questions with head nods/shakes.  She was given IV TPA.  She is an AK I with low NIH and therefore CTA was not done, MRA was attempted but she was too short of breath when laying flat and therefore this was not done.  She improved markedly with the IV TPA, but then  Began complaining of abdominal pain.  She started vomiting with concern for hematemesis.  LKW: 5:30 AM tpa given?:  Modified Rankin Score: 0  SUBJECTIVE (INTERVAL HISTORY) No family is at the bedside. Patient is found laying in bed in NAD. Overall she feels her condition is rapidly improving. Voices no new complaints. Continues to have elevated B/P. Has agreed to MRI with Ativan PRN. Has agreed to TEE/Loop in AM   OBJECTIVE Lab Results: CBC:  Recent Labs  Lab 04/17/17 0937  04/17/17 1712 04/18/17 0132 04/18/17 1258  WBC 7.4  --   --  11.5*  --   HGB 9.3*   < > 9.5* 8.5* 8.0*  HCT 30.7*   < > 31.1* 28.5* 27.3*  MCV 84.6  --   --  85.8  --   PLT 257  --   --  267  --    < > = values in this interval not displayed.   BMP: Recent Labs  Lab 04/17/17 0937 04/17/17 0944 04/17/17 1648  NA 137 141  --   K 3.9 4.0  --   CL 107 109  --   CO2 19*  --   --   GLUCOSE 108* 107*  --   BUN 40* 36*  --   CREATININE 3.22* 3.40*  --   CALCIUM 8.8*  --   --   MG  --   --  1.9   Liver Function Tests:  Recent Labs  Lab 04/17/17 0937  AST 19  ALT 9*  ALKPHOS 98  BILITOT 0.7  PROT 8.2*  ALBUMIN 3.4*   Cardiac Enzymes:  Recent Labs  Lab 04/17/17 1712  04/18/17 0132  TROPONINI 0.29* 0.27*   Coagulation Studies:  Recent Labs    04/17/17 0937  APTT 32  INR 1.08   PHYSICAL EXAM Temp:  [97.8 F (36.6 C)-98.3 F (36.8 C)] 98.3 F (36.8 C) (01/10 1547) Pulse Rate:  [54-80] 67 (01/10 1900) Resp:  [13-27] 17 (01/10 1900) BP: (120-194)/(48-96) 167/86 (01/10 1900) SpO2:  [89 %-100 %] 89 % (01/10 1900) Weight:  [115.3 kg (254 lb 3.1 oz)] 115.3 kg (254 lb 3.1 oz) (01/09 2315) General - Well nourished, well developed, in no apparent distress HEENT-  Normocephalic, Normal external eye/conjunctiva.  Normal external ears. Normal external nose, mucus membranes and septum.   Cardiovascular - Regular rate and rhythm  Respiratory - Lungs clear bilaterally. No wheezing. Abdomen - soft and non-tender, BS normal Extremities- no edema or cyanosis  Neuro Exam : Mental Status: Patient is awake, alert, she is able to answer questions, with mild expressive aphasia, Patient is able to give a clear and coherent history. No signs  neglect Cranial Nerves: II: Visual Fields are full.  Pupils are equal, round, and reactive to light.   III,IV, VI: EOMI without ptosis or diploplia.  V: Facial sensation is symmetric to temperature VII: Facial movement is symmetric.  VIII: hearing is intact to voice X: Uvula elevates symmetrically XI: Shoulder shrug is symmetric. XII: tongue is midline without atrophy or fasciculations.  Motor: Tone is normal. Bulk is normal. 5/5 strength was present in all four extremities.  Sensory: Sensation is symmetric to light touch and temperature in the arms and legs. Cerebellar: FNF and HKS are intact bilaterally  IMAGING: I have personally reviewed the radiological images below and agree with the radiology interpretations.  MRI/MRA Head/Brain Wo Contrast Result Date: 04/18/2017 IMPRESSION: Acute infarction in a left posterior M3 branch distribution affecting the temporoparietal junction, measuring about 5 cm in size.  Swelling but no hemorrhage or mass effect. Mild chronic small-vessel ischemic changes elsewhere within the hemispheric white matter. No large vessel anterior circulation finding. Probable missing posterior M3 branch on the left. Stenotic distal right vertebral artery. Electronically Signed   By: Nelson Chimes M.D.   On: 04/18/2017 12:32   Dg Chest Portable 1 View Result Date: 04/17/2017  IMPRESSION: Mild to moderate CHF. Electronically Signed   By: Rolm Baptise M.D.   On: 04/17/2017 11:56   Ct Head Code Stroke Wo Contrast Result Date: 04/17/2017 IMPRESSION: 1. Acute nonhemorrhagic infarction affecting the RIGHT occipital cortex and underlying white matter, as described above.Vascular distribution corresponds to the posterior-most M3 division LEFT MCA. 2. ASPECTS is 9. These results were called by telephone at the time of interpretation on 04/17/2017 at 9:51 a.m. to Dr. Leonel Ramsay , who verbally acknowledged these results. Electronically Signed   By: Staci Righter M.D.   On: 04/17/2017 10:05   Echocardiogram:                                               Study Conclusions - Left ventricle: The cavity size was normal. Wall thickness was   normal. Systolic function was normal. The estimated ejection   fraction was in the range of 60% to 65%. - Aortic valve: There was mild regurgitation. - Mitral valve: There was mild regurgitation. - Left atrium: The atrium was moderately dilated. - Right atrium: The atrium was mildly dilated. - Atrial septum: No defect or patent foramen ovale was identified.  B/L Carotid U/S:                                                 Final Interpretation: Right Carotid: There is evidence in the right ICA of a 1-39% stenosis. Left Carotid: There is evidence in the left ICA of a 1-39% stenosis. Vertebrals: Left vertebral artery was patent with antegrade flow. Right vertebral appears partially occluded.  TEE /Loop Recorder:                                        PENDING     IMPRESSION: [name] is a 52 y.o. female with PMH of HTN who presented with an acute onset of severe expressive aphasia, She improved markedly with the IV TPA, but then began complaining of  abdominal pain.  She started vomiting with concern for hematemesis. Unfortunately, I would be very hesitant to reverse this even if there is some GI bleeding(Gastroccult pending) even her rapid improvement.  I suspect the elevated troponin is related to the stroke, no chest pain or EKG change, will trend.   Acute infarction in a left posterior M3 branch distribution affecting the temporoparietal junction, measuring about 5 cm in size of likely embolic etiology  Acute nonhemorrhagic infarction affecting the RIGHT occipital cortex and underlying white matter,   Suspected Etiology:   cardioembolic or cryptogenic  source, Resultant Symptoms: severe expressive aphasia, Stroke Risk Factors: hyperlipidemia and hypertension Other Stroke Risk Factors: Obesity, Body mass index is 39.81 kg/m.   Outstanding Stroke Work-up Studies:     TEE /Loop Recorder:                                            04/18/2017 ASSESSMENT:   Neuro exam stable. Mild expressive aphasia. Continues to have elevated B/P. MRI post tPA without evidence of bleeding. Appreciate CCM assistance with multiple medical problems. Plan for TEE/Loop in AM  PLAN  04/18/2017: Continue Statin HOLD ASA until 24 hour post tPA neuroimaging is stable & without evidence of bleeding Frequent neuro checks Telemetry monitoring PT/OT/SLP Consult PM & Rehab Consult Case Management /MSW Ongoing aggressive stroke risk factor management Patient counseled to be compliant with her antithrombotic medications Patient counseled on Lifestyle modifications including, Diet, Exercise, and Stress Follow up with [facility] in 6 weeks  HX OF STROKES: left parietal small infarct, looks old  R/O AFIB TEE and Loop Recorder Placement in  AM  Abnormal EKG- Prolonged QTc and Elevated Troponins Cardiology Consultation TEE in AM  MEDICAL ISSUES: CCM following, Appreciate Assistance Acute hypoxic respiratory failure related to pulmonary edema  HTN crisis - suspect pt has very poorly controlled HTN at home, previous 3 med regimen, r/o for RAS previously  AKI - unclear baseline ( 2.25 in 12/2015)   Anemia- appears chronic  Repeat labs in AM  Hypocalcemia Replacement in progress Repeat labs in AM  CHF Cardiology following  Concern for UGIB s/p TPA IV Protonix in progress GI Consulted  HYPERTENSION: Unstable, elevated B/P's noted overnight SBP goal of < 180. DBP goal of < 105.  Nicardipine drip, Labetolol PRN Long term BP goal normotensive. May slowly restart home B/P medications after 48 hours Coreg restarted and Norvasc added 04/18/2017 Home Meds: Coreg  HYPERLIPIDEMIA:    Component Value Date/Time   CHOL 185 04/18/2017 0132   TRIG 73 04/18/2017 0132   HDL 41 04/18/2017 0132   CHOLHDL 4.5 04/18/2017 0132   VLDL 15 04/18/2017 0132   LDLCALC 129 (H) 04/18/2017 0132  Home Meds:  NONE LDL  goal < 70 Started on Lipitor to 40 mg daily Continue statin at discharge  R/O DIABETES: Lab Results  Component Value Date   HGBA1C 5.2 04/18/2017  HgbA1c goal < 7.0  OBESITY Obesity, Body mass index is 39.81 kg/m. Greater than/equal to 30  Other Active Problems: Active Problems:   Stroke (cerebrum) [facility])   Hypertensive emergency   Demand ischemia [facility])    Hospital day # 1 VTE prophylaxis: SCD's  Diet : Diet Heart Room service appropriate? Yes; Fluid consistency: Thin Diet NPO time specified   FAMILY UPDATES: No family at bedside  TEAM UPDATES: Garvin Fila, MD  Prior Home Stroke Medications:  No antithrombotic  Discharge Stroke Meds:  Please discharge patient on TBD   Disposition: 01-Home or Self Care Therapy Recs:               PENDING Home Equipment:         PENDING Follow Up:  Follow-up  Information    Garvin Fila, MD. Schedule an appointment as soon as possible for a visit in 6 week(s).   Specialties:  Neurology, Radiology Contact information: [address] [place] [address] [phone]          Lois Huxley, [place] -PCP Follow up in 1-2 weeks      Assessment & plan discussed with with attending physician and they are in agreement.    Mary Sella, ANP-C Stroke Neurology Team 04/18/2017 7:24 PM I have personally examined this patient, reviewed notes, independently viewed imaging studies, participated in medical decision making and plan of care.ROS completed by me personally and pertinent positives fully documented  I have made any additions or clarifications directly to the above note. Agree with note above. Continue close neurological observation and strict blood pressure control as per post TPA protocol.   She presented she presented with embolic infarcts from undetermined source.  She is obtained significant benefit after TPA.  Recommend TEE and loop recorder insertion.  No family available at bedside for discussion. This patient is critically ill and at significant risk of neurological worsening, death and care requires constant monitoring of vital signs, hemodynamics,respiratory and cardiac monitoring, extensive review of multiple databases, frequent neurological assessment, discussion with family, other specialists and medical decision making of high complexity.I have made any additions or clarifications directly to the above note.This critical care time does not reflect procedure time, or teaching time or supervisory time of PA/NP/Med Resident etc but could involve care discussion time.  I spent 30 minutes of neurocritical care time  in the care of  this patient.     Antony Contras, MD Medical Director [facility] Pager: [phone] 04/18/2017 8:06 PM  To contact Stroke Continuity provider, please refer to [email or web address] After  hours, contact General Neurology

## 2017-04-18 NOTE — H&P (View-Only) (Signed)
Cardiology Consultation:   Patient ID: Erin Dean; [id number]; 10-Feb-1966   Admit date: 04/17/2017 Date of Consult: 04/18/2017  Primary Care Provider: Lois Huxley, PA Primary Cardiologist: Larae Grooms, MD  Primary Electrophysiologist:  NA   Patient Profile:   Erin Dean is a 52 y.o. female with a hx of HTN which is difficult to control at times, obesity, systolic murmur, compliance who is being seen today for the evaluation of elevated troponins after admit 04/17/17 for acute CVA at the request of Dr. Leonie Man.  History of Present Illness:   Erin Dean a hx of HTN which is difficult to control at times, obesity, systolic murmur, compliance with echo in 2017 with normal LV function, mild LVH, G2DD, mild MR.   She is now admitted with CVA - yesterday at 0530 she began having difficulty speaking - code stroke called and TPA given.  She did improve with the IV TPA.     Her troponin was elevated thought to be from CVA.  Her BP on arrival to ER yesterday was 270/140 treated with labetalol.  She did develop SOB and abd pain and vomiting.  Occult + placed on IV protonix.   She was hypoxic and found to have pulmonary edema given IV lasix.  Today MRI with acute infarct in Lt posterior M3 branch distribution - some swelling but no hemorrhage.   EKG:  The EKG was personally reviewed and demonstrates:  SR with Q waves in II, III, AVF old, LVH Today's EKG SR with new T wave inversions in ant lat leads Telemetry:  Telemetry was personally reviewed and demonstrates:  SR occ PVCs  Troponin 0.22; 0.29; 0.27  BNP 1262 Cr 3.22 on admit (Cr 2.25 12/23/15) BUN 40  Hgb 9.3 on admit and now 8.0  CXR yesterday with HF  ECHO today with EF 60-65%, wall thickness was normal, mild AR, mild MR, LA moderately dilated and RA mildly dilated.   Currently no chest pain or SOB sleeping on her back flat.  She tells me she has not recently had any chest pain or SOB prior to this admit.  She has been  feeling well until yesterday.   Plans for TEE tomorrow as well.   Past Medical History:  Diagnosis Date  . Hypertension     Past Surgical History:  Procedure Laterality Date  . CESAREAN SECTION       Home Medications:  Prior to Admission medications   Medication Sig Start Date End Date Taking? Authorizing Provider  carvedilol (COREG) 6.25 MG tablet Take 1 tablet (6.25 mg total) by mouth 2 (two) times daily. 12/15/15  Yes Jettie Booze, MD  escitalopram (LEXAPRO) 10 MG tablet Take 10 mg by mouth daily.   Yes [provider]  ferrous sulfate 325 (65 FE) MG tablet Take 325 mg by mouth 2 (two) times daily with a meal.    Yes [provider]  ALPRAZolam (XANAX) 0.5 MG tablet Take 0.25-0.5 mg by mouth 3 (three) times daily as needed (anxiety). Pt has not taken yet    [provider]  amLODipine (NORVASC) 10 MG tablet Take 1 tablet (10 mg total) by mouth daily. 12/01/15 02/29/16  Jettie Booze, MD  hydrALAZINE (APRESOLINE) 25 MG tablet Take 1 tablet (25 mg total) by mouth 3 (three) times daily. 12/26/15 12/20/16  Jettie Booze, MD  HYDROcodone-acetaminophen (NORCO/VICODIN) 5-325 MG tablet Take 2 tablets by mouth every 4 (four) hours as needed. 08/04/16   Fransico Meadow, PA-C  spironolactone (ALDACTONE) 25 MG tablet Take 1 tablet (25 mg total) by mouth daily. 12/16/15 12/10/16  Jettie Booze, MD  valsartan-hydrochlorothiazide (DIOVAN-HCT) 320-25 MG tablet Take 1 tablet by mouth daily. 12/23/15   Burnell Blanks, MD    Inpatient Medications: Scheduled Meds: .  stroke: mapping our early stages of recovery book   Does not apply Once  . acetaminophen  650 mg Oral Once  . amLODipine  10 mg Oral Daily  . carvedilol  6.25 mg Oral BID  . hydrALAZINE  25 mg Oral TID  . pantoprazole (PROTONIX) IV  40 mg Intravenous QHS   Continuous Infusions: . calcium gluconate    . clevidipine Stopped (04/18/17 1100)   PRN Meds: acetaminophen **OR**  acetaminophen (TYLENOL) oral liquid 160 mg/5 mL **OR** acetaminophen, ALPRAZolam, labetalol  Allergies:   No Known Allergies  Social History:   Social History   Socioeconomic History  . Marital status: Legally Separated    Spouse name: Not on file  . Number of children: Not on file  . Years of education: Not on file  . Highest education level: Not on file  Social Needs  . Financial resource strain: Not on file  . Food insecurity - worry: Not on file  . Food insecurity - inability: Not on file  . Transportation needs - medical: Not on file  . Transportation needs - non-medical: Not on file  Occupational History  . Not on file  Tobacco Use  . Smoking status: Never Smoker  . Smokeless tobacco: Never Used  Substance and Sexual Activity  . Alcohol use: No  . Drug use: No  . Sexual activity: Not on file  Other Topics Concern  . Not on file  Social History Narrative  . Not on file    Family History:    Family History  Problem Relation Age of Onset  . Heart attack Maternal Uncle   . Heart attack Maternal Grandfather   . Stroke Paternal Grandmother      ROS:  Please see the history of present illness.  ROS  General:no colds or fevers, no weight changes Skin:no rashes or ulcers HEENT:no blurred vision, no congestion CV:see HPI PUL:see HPI GI:no diarrhea constipation or melena, no indigestion GU:no hematuria, no dysuria MS:no joint pain, no claudication Neuro:no syncope, no lightheadedness Endo:no diabetes, no thyroid disease   Physical Exam/Data:   Vitals:   04/18/17 1015 04/18/17 1029 04/18/17 1100 04/18/17 1200  BP: (!) 170/70 (!) 170/70 (!) 157/71 (!) 174/77  Pulse: 77  69 72  Resp: (!) 26  (!) 26 (!) 22  Temp:      TempSrc:      SpO2: 98% 96% 99% 100%  Weight:      Height:        Intake/Output Summary (Last 24 hours) at 04/18/2017 1348 Last data filed at 04/17/2017 1940 Gross per 24 hour  Intake 100 ml  Output 1900 ml  Net -1800 ml   Filed Weights     04/17/17 0900 04/17/17 2315  Weight: 260 lb 12.9 oz (118.3 kg) 254 lb 3.1 oz (115.3 kg)   Body mass index is 39.81 kg/m.  General:  Well nourished, well developed, in no acute distress, sleepy currently  HEENT: normal Lymph: no adenopathy Neck: no JVD Endocrine:  No thryomegaly Vascular: No carotid bruits; 2+ pedal pulses bil, no pitting edema Cardiac:  normal S1, S2; RRR; no murmur gallup rub or click Lungs:  clear to auscultation bilaterally, no wheezing, rhonchi or rales  Abd: soft, nontender, no hepatomegaly  Ext: no edema Musculoskeletal:  No deformities, BUE and BLE strength normal and equal Skin: warm and dry  Neuro:  A&O X 3 MAE follows commands speech is normal, no focal abnormalities noted Psych:  Normal affect    Relevant CV Studies: 04/18/17 Echo Study Conclusions  - Left ventricle: The cavity size was normal. Wall thickness was   normal. Systolic function was normal. The estimated ejection   fraction was in the range of 60% to 65%. - Aortic valve: There was mild regurgitation. - Mitral valve: There was mild regurgitation. - Left atrium: The atrium was moderately dilated. - Right atrium: The atrium was mildly dilated. - Atrial septum: No defect or patent foramen ovale was identified.  Laboratory Data:  Chemistry Recent Labs  Lab 04/17/17 0937 04/17/17 0944  NA 137 141  K 3.9 4.0  CL 107 109  CO2 19*  --   GLUCOSE 108* 107*  BUN 40* 36*  CREATININE 3.22* 3.40*  CALCIUM 8.8*  --   GFRNONAA 15*  --   GFRAA 18*  --   ANIONGAP 11  --     Recent Labs  Lab 04/17/17 0937  PROT 8.2*  ALBUMIN 3.4*  AST 19  ALT 9*  ALKPHOS 98  BILITOT 0.7   Hematology Recent Labs  Lab 04/17/17 0937 04/17/17 0944 04/17/17 1712 04/18/17 0132  WBC 7.4  --   --  11.5*  RBC 3.63*  --   --  3.32*  HGB 9.3* 10.5* 9.5* 8.5*  HCT 30.7* 31.0* 31.1* 28.5*  MCV 84.6  --   --  85.8  MCH 25.6*  --   --  25.6*  MCHC 30.3  --   --  29.8*  RDW 15.9*  --   --  16.0*   PLT 257  --   --  267   Cardiac Enzymes Recent Labs  Lab 04/17/17 1712 04/18/17 0132  TROPONINI 0.29* 0.27*    Recent Labs  Lab 04/17/17 0942  TROPIPOC 0.22*    BNP Recent Labs  Lab 04/18/17 0132  BNP 1,262.2*    DDimer No results for input(s): DDIMER in the last 168 hours.  Radiology/Studies:  Mr Virgel Paling ZO Contrast  Result Date: 04/18/2017 CLINICAL DATA:  Acute development of speech disturbance beginning yesterday. EXAM: MRI HEAD WITHOUT CONTRAST MRA HEAD WITHOUT CONTRAST TECHNIQUE: Multiplanar, multiecho pulse sequences of the brain and surrounding structures were obtained without intravenous contrast. Angiographic images of the head were obtained using MRA technique without contrast. COMPARISON:  CT 04/17/2017 FINDINGS: MRI HEAD FINDINGS Brain: 5 cm region of acute infarction affecting the left temporoparietal junction consistent with left M3 branch occlusion infarction. Mild swelling. No hemorrhage or mass effect. The remainder the brain shows some chronic small-vessel ischemic changes of the hemispheric white matter on each side. No other large vessel territory infarction. No mass, hydrocephalus or extra-axial collection. Vascular: Major vessels at the base of the brain show flow. Skull and upper cervical spine: Negative Sinuses/Orbits: Clear/normal Other: None MRA HEAD FINDINGS Both internal carotid arteries are widely patent through the skull base and siphon regions. The anterior and middle cerebral vessels are patent without proximal stenosis, aneurysm or vascular malformation. There is probably a missing M3 branch posteriorly on the left. Left vertebral artery is dominant and widely patent to the basilar. The right vertebral artery is a small irregular vessel. There is probably acquired stenosis. No basilar stenosis. Superior cerebellar and posterior cerebral arteries appear normal. IMPRESSION: Acute  infarction in a left posterior M3 branch distribution affecting the  temporoparietal junction, measuring about 5 cm in size. Swelling but no hemorrhage or mass effect. Mild chronic small-vessel ischemic changes elsewhere within the hemispheric white matter. No large vessel anterior circulation finding. Probable missing posterior M3 branch on the left. Stenotic distal right vertebral artery. Electronically Signed   By: Nelson Chimes M.D.   On: 04/18/2017 12:32   Mr Brain Wo Contrast  Result Date: 04/18/2017 CLINICAL DATA:  Acute development of speech disturbance beginning yesterday. EXAM: MRI HEAD WITHOUT CONTRAST MRA HEAD WITHOUT CONTRAST TECHNIQUE: Multiplanar, multiecho pulse sequences of the brain and surrounding structures were obtained without intravenous contrast. Angiographic images of the head were obtained using MRA technique without contrast. COMPARISON:  CT 04/17/2017 FINDINGS: MRI HEAD FINDINGS Brain: 5 cm region of acute infarction affecting the left temporoparietal junction consistent with left M3 branch occlusion infarction. Mild swelling. No hemorrhage or mass effect. The remainder the brain shows some chronic small-vessel ischemic changes of the hemispheric white matter on each side. No other large vessel territory infarction. No mass, hydrocephalus or extra-axial collection. Vascular: Major vessels at the base of the brain show flow. Skull and upper cervical spine: Negative Sinuses/Orbits: Clear/normal Other: None MRA HEAD FINDINGS Both internal carotid arteries are widely patent through the skull base and siphon regions. The anterior and middle cerebral vessels are patent without proximal stenosis, aneurysm or vascular malformation. There is probably a missing M3 branch posteriorly on the left. Left vertebral artery is dominant and widely patent to the basilar. The right vertebral artery is a small irregular vessel. There is probably acquired stenosis. No basilar stenosis. Superior cerebellar and posterior cerebral arteries appear normal. IMPRESSION: Acute  infarction in a left posterior M3 branch distribution affecting the temporoparietal junction, measuring about 5 cm in size. Swelling but no hemorrhage or mass effect. Mild chronic small-vessel ischemic changes elsewhere within the hemispheric white matter. No large vessel anterior circulation finding. Probable missing posterior M3 branch on the left. Stenotic distal right vertebral artery. Electronically Signed   By: Nelson Chimes M.D.   On: 04/18/2017 12:32   Dg Chest Portable 1 View  Result Date: 04/17/2017 CLINICAL DATA:  Shortness of Breath EXAM: PORTABLE CHEST 1 VIEW COMPARISON:  None. FINDINGS: Cardiomegaly with vascular congestion and bilateral perihilar and lower lobe opacities compatible with edema/CHF. No visible effusions or acute bony abnormality. IMPRESSION: Mild to moderate CHF. Electronically Signed   By: Rolm Baptise M.D.   On: 04/17/2017 11:56   Ct Head Code Stroke Wo Contrast  Result Date: 04/17/2017 CLINICAL DATA:  Code stroke.  Aphasia with blurred vision. EXAM: CT HEAD WITHOUT CONTRAST TECHNIQUE: Contiguous axial images were obtained from the base of the skull through the vertex without intravenous contrast. COMPARISON:  09/22/2012. FINDINGS: Brain: Cortically based areas of hypoattenuation affecting the LEFT posterior parietal and superior occipital lobes, also with involvement of the underlying white matter. No associated hemorrhage. No mass lesion, hydrocephalus, or extra-axial fluid. Normal for age cerebral volume. Vascular: No hyperdense vessel or unexpected calcification. Skull: Normal. Negative for fracture or focal lesion. Sinuses/Orbits: No acute finding. Other: None. ASPECTS [facility] Stroke Program Early CT Score) - Ganglionic level infarction (caudate, lentiform nuclei, internal capsule, insula, M1-M3 cortex): 7 - Supraganglionic infarction (M4-M6 cortex): 2 Total score (0-10 with 10 being normal): 9 IMPRESSION: 1. Acute nonhemorrhagic infarction affecting the RIGHT occipital  cortex and underlying white matter, as described above.Vascular distribution corresponds to the posterior-most M3 division LEFT MCA. 2. ASPECTS  is 9. These results were called by telephone at the time of interpretation on 04/17/2017 at 9:51 a.m. to Dr. Leonel Ramsay , who verbally acknowledged these results. Electronically Signed   By: Staci Righter M.D.   On: 04/17/2017 10:05    Assessment and Plan:   1. Elevated Troponin, but flat, may be from CVA alone -no prior hx of CAD EKG with some abnormalities today  EF is normal  Dr. Oval Linsey to see 2. 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 1900 today 3. Acute CVA with HTN and non compliance in the past.  Sever malignant htn on admit  Neuro following 4. HTN elevated, would increase hydralazine - will leave to Neuro  Plan for TEE tomorrow     [place] has been requested to perform a transesophageal echocardiogram on Erin Dean for CVA.  After careful review of history and examination, the risks and benefits of transesophageal echocardiogram have been explained including risks of esophageal damage, perforation (1:10,000 risk), bleeding, pharyngeal hematoma as well as other potential complications associated with conscious sedation including aspiration, arrhythmia, respiratory failure and death. Alternatives to treatment were discussed, questions were answered. Patient is willing to proceed.    For questions or updates, please contact [place] Please consult [email or web address] for contact info under Cardiology/STEMI.   Signed, Cecilie Kicks, NP  04/18/2017 1:48 PM

## 2017-04-18 NOTE — Consult Note (Signed)
Reason for Consult: ? Hematemesis Referring Physician: Neurology  Erin Dean HPI: This is a 52 year old female admitted for a code stroke.  She was noted to have expressive aphasia at work and EMS was called.  The patient has a history of HTN and upon evaluation she was in a hypertensive crisis treated with labetolol.  Full dose tPA was administered and when an MRI was attempted she complained about abdominal pain as well as SOB.  A minor vomiting episode occurred and the material was dark.  This was tested with a gastroccult, which was positive.  Her HGB has dropped by 2 grams and on admission it was at 10 g/dL.  Protonix was started.  She denies any issues with GERD, nausea, vomiting, or dysphagia.  There is no prior history of a GI bleed.  Past Medical History:  Diagnosis Date  . Hypertension     Past Surgical History:  Procedure Laterality Date  . CESAREAN SECTION      Family History  Problem Relation Age of Onset  . Heart attack Maternal Uncle   . Heart attack Maternal Grandfather   . Stroke Paternal Grandmother     Social History:  reports that  has never smoked. she has never used smokeless tobacco. She reports that she does not drink alcohol or use drugs.  Allergies: No Known Allergies  Medications:  Scheduled: .  stroke: mapping our early stages of recovery book   Does not apply Once  . acetaminophen  650 mg Oral Once  . amLODipine  10 mg Oral Daily  . carvedilol  6.25 mg Oral BID  . hydrALAZINE  25 mg Oral TID  . pantoprazole (PROTONIX) IV  40 mg Intravenous QHS   Continuous: . calcium gluconate    . clevidipine Stopped (04/18/17 1100)    Results for orders placed or performed during the hospital encounter of 04/17/17 (from the past 24 hour(s))  Magnesium     Status: None   Collection Time: 04/17/17  4:48 PM  Result Value Ref Range   Magnesium 1.9 1.7 - 2.4 mg/dL  Hemoglobin and hematocrit, blood     Status: Abnormal   Collection Time: 04/17/17  5:12 PM   Result Value Ref Range   Hemoglobin 9.5 (L) 12.0 - 15.0 g/dL   HCT 31.1 (L) 36.0 - 46.0 %  Troponin I (q 6hr x 3)     Status: Abnormal   Collection Time: 04/17/17  5:12 PM  Result Value Ref Range   Troponin I 0.29 (HH) <0.03 ng/mL  MRSA PCR Screening     Status: None   Collection Time: 04/17/17 11:10 PM  Result Value Ref Range   MRSA by PCR NEGATIVE NEGATIVE  Brain natriuretic peptide     Status: Abnormal   Collection Time: 04/18/17  1:32 AM  Result Value Ref Range   B Natriuretic Peptide 1,262.2 (H) 0.0 - 100.0 pg/mL  Troponin I (q 6hr x 3)     Status: Abnormal   Collection Time: 04/18/17  1:32 AM  Result Value Ref Range   Troponin I 0.27 (HH) <0.03 ng/mL  CBC     Status: Abnormal   Collection Time: 04/18/17  1:32 AM  Result Value Ref Range   WBC 11.5 (H) 4.0 - 10.5 K/uL   RBC 3.32 (L) 3.87 - 5.11 MIL/uL   Hemoglobin 8.5 (L) 12.0 - 15.0 g/dL   HCT 28.5 (L) 36.0 - 46.0 %   MCV 85.8 78.0 - 100.0 fL  MCH 25.6 (L) 26.0 - 34.0 pg   MCHC 29.8 (L) 30.0 - 36.0 g/dL   RDW 16.0 (H) 11.5 - 15.5 %   Platelets 267 150 - 400 K/uL  Hemoglobin A1c     Status: None   Collection Time: 04/18/17  1:32 AM  Result Value Ref Range   Hgb A1c MFr Bld 5.2 4.8 - 5.6 %   Mean Plasma Glucose 102.54 mg/dL  Lipid panel     Status: Abnormal   Collection Time: 04/18/17  1:32 AM  Result Value Ref Range   Cholesterol 185 0 - 200 mg/dL   Triglycerides 73 <150 mg/dL   HDL 41 >40 mg/dL   Total CHOL/HDL Ratio 4.5 RATIO   VLDL 15 0 - 40 mg/dL   LDL Cholesterol 129 (H) 0 - 99 mg/dL  Hemoglobin and hematocrit, blood     Status: Abnormal   Collection Time: 04/18/17 12:58 PM  Result Value Ref Range   Hemoglobin 8.0 (L) 12.0 - 15.0 g/dL   HCT 27.3 (L) 36.0 - 46.0 %     Mr [facility] Wo Contrast  Result Date: 04/18/2017 CLINICAL DATA:  Acute development of speech disturbance beginning yesterday. EXAM: MRI HEAD WITHOUT CONTRAST MRA HEAD WITHOUT CONTRAST TECHNIQUE: Multiplanar, multiecho pulse sequences of  the brain and surrounding structures were obtained without intravenous contrast. Angiographic images of the head were obtained using MRA technique without contrast. COMPARISON:  CT 04/17/2017 FINDINGS: MRI HEAD FINDINGS Brain: 5 cm region of acute infarction affecting the left temporoparietal junction consistent with left M3 branch occlusion infarction. Mild swelling. No hemorrhage or mass effect. The remainder the brain shows some chronic small-vessel ischemic changes of the hemispheric white matter on each side. No other large vessel territory infarction. No mass, hydrocephalus or extra-axial collection. Vascular: Major vessels at the base of the brain show flow. Skull and upper cervical spine: Negative Sinuses/Orbits: Clear/normal Other: None MRA HEAD FINDINGS Both internal carotid arteries are widely patent through the skull base and siphon regions. The anterior and middle cerebral vessels are patent without proximal stenosis, aneurysm or vascular malformation. There is probably a missing M3 branch posteriorly on the left. Left vertebral artery is dominant and widely patent to the basilar. The right vertebral artery is a small irregular vessel. There is probably acquired stenosis. No basilar stenosis. Superior cerebellar and posterior cerebral arteries appear normal. IMPRESSION: Acute infarction in a left posterior M3 branch distribution affecting the temporoparietal junction, measuring about 5 cm in size. Swelling but no hemorrhage or mass effect. Mild chronic small-vessel ischemic changes elsewhere within the hemispheric white matter. No large vessel anterior circulation finding. Probable missing posterior M3 branch on the left. Stenotic distal right vertebral artery. Electronically Signed   By: Nelson Chimes M.D.   On: 04/18/2017 12:32   Mr Brain Wo Contrast  Result Date: 04/18/2017 CLINICAL DATA:  Acute development of speech disturbance beginning yesterday. EXAM: MRI HEAD WITHOUT CONTRAST MRA HEAD WITHOUT  CONTRAST TECHNIQUE: Multiplanar, multiecho pulse sequences of the brain and surrounding structures were obtained without intravenous contrast. Angiographic images of the head were obtained using MRA technique without contrast. COMPARISON:  CT 04/17/2017 FINDINGS: MRI HEAD FINDINGS Brain: 5 cm region of acute infarction affecting the left temporoparietal junction consistent with left M3 branch occlusion infarction. Mild swelling. No hemorrhage or mass effect. The remainder the brain shows some chronic small-vessel ischemic changes of the hemispheric white matter on each side. No other large vessel territory infarction. No mass, hydrocephalus or extra-axial collection. Vascular:  Major vessels at the base of the brain show flow. Skull and upper cervical spine: Negative Sinuses/Orbits: Clear/normal Other: None MRA HEAD FINDINGS Both internal carotid arteries are widely patent through the skull base and siphon regions. The anterior and middle cerebral vessels are patent without proximal stenosis, aneurysm or vascular malformation. There is probably a missing M3 branch posteriorly on the left. Left vertebral artery is dominant and widely patent to the basilar. The right vertebral artery is a small irregular vessel. There is probably acquired stenosis. No basilar stenosis. Superior cerebellar and posterior cerebral arteries appear normal. IMPRESSION: Acute infarction in a left posterior M3 branch distribution affecting the temporoparietal junction, measuring about 5 cm in size. Swelling but no hemorrhage or mass effect. Mild chronic small-vessel ischemic changes elsewhere within the hemispheric white matter. No large vessel anterior circulation finding. Probable missing posterior M3 branch on the left. Stenotic distal right vertebral artery. Electronically Signed   By: Nelson Chimes M.D.   On: 04/18/2017 12:32   Dg Chest Portable 1 View  Result Date: 04/17/2017 CLINICAL DATA:  Shortness of Breath EXAM: PORTABLE CHEST 1  VIEW COMPARISON:  None. FINDINGS: Cardiomegaly with vascular congestion and bilateral perihilar and lower lobe opacities compatible with edema/CHF. No visible effusions or acute bony abnormality. IMPRESSION: Mild to moderate CHF. Electronically Signed   By: Rolm Baptise M.D.   On: 04/17/2017 11:56   Ct Head Code Stroke Wo Contrast  Result Date: 04/17/2017 CLINICAL DATA:  Code stroke.  Aphasia with blurred vision. EXAM: CT HEAD WITHOUT CONTRAST TECHNIQUE: Contiguous axial images were obtained from the base of the skull through the vertex without intravenous contrast. COMPARISON:  09/22/2012. FINDINGS: Brain: Cortically based areas of hypoattenuation affecting the LEFT posterior parietal and superior occipital lobes, also with involvement of the underlying white matter. No associated hemorrhage. No mass lesion, hydrocephalus, or extra-axial fluid. Normal for age cerebral volume. Vascular: No hyperdense vessel or unexpected calcification. Skull: Normal. Negative for fracture or focal lesion. Sinuses/Orbits: No acute finding. Other: None. ASPECTS [facility] Stroke Program Early CT Score) - Ganglionic level infarction (caudate, lentiform nuclei, internal capsule, insula, M1-M3 cortex): 7 - Supraganglionic infarction (M4-M6 cortex): 2 Total score (0-10 with 10 being normal): 9 IMPRESSION: 1. Acute nonhemorrhagic infarction affecting the RIGHT occipital cortex and underlying white matter, as described above.Vascular distribution corresponds to the posterior-most M3 division LEFT MCA. 2. ASPECTS is 9. These results were called by telephone at the time of interpretation on 04/17/2017 at 9:51 a.m. to Dr. Leonel Ramsay , who verbally acknowledged these results. Electronically Signed   By: Staci Righter M.D.   On: 04/17/2017 10:05    ROS:  As stated above in the HPI otherwise negative.  Blood pressure (!) 174/77, pulse 72, temperature 97.8 F (36.6 C), temperature source Oral, resp. rate (!) 22, height 5\' 7"  (1.702 m),  weight 115.3 kg (254 lb 3.1 oz), SpO2 100 %.    PE: Gen: NAD, arousable (patient was given Ativan for her MRI) HEENT:  [place]/AT, EOMI Neck: Supple, no LAD Lungs: CTA Bilaterally CV: RRR without M/G/R ABM: Soft, NTND, +BS Ext: No C/C/E  Assessment/Plan: 1) ? Hematemesis. 2) Anemia. 3) Left temporoparietal CVA    The patient is much better today.  She does not have expressive aphasia at this time.  Her vital signs are stable, but her HGB has dropped.  No further hematemesis, hematochezia, or melena.  Further evaluation will be performed with an EGD.  Plan: 1) EGD tomorrow. Brice Potteiger D 04/18/2017, 2:04 PM

## 2017-04-18 NOTE — Progress Notes (Signed)
  Echocardiogram 2D Echocardiogram has been performed.  Erin Dean 04/18/2017, 10:01 AM

## 2017-04-18 NOTE — Progress Notes (Signed)
PT Cancellation Note  Patient Details Name: Erin Dean MRN: [id number] DOB: 1965-08-25   Cancelled Treatment:    Reason Eval/Treat Not Completed: Medical issues which prohibited therapy(pt currently on strict bedrest and await increased activity order)   Katara Griner B Melyssa Signor 04/18/2017, 7:27 AM  Elwyn Reach, [place]

## 2017-04-18 NOTE — Evaluation (Signed)
Physical Therapy Evaluation/ Discharge Patient Details Name: Erin Dean MRN: [id number] DOB: October 05, 1965 Today's Date: 04/18/2017   History of Present Illness  52 year old left handed female who presented with aphasia, blurred vision, and headache ischemic infarct LEFT posterior parietal and superior occipital lobes. PMHx: HTN, heart murmur  Clinical Impression  Pt very pleasant and eager to get OOB to bathroom. Pt with functional strength and sensation bil LE with decreased cognition impacting function. Pt able to perform transfers and gait with assist for directional cues but no physical assist required. Pt without further P.T. Needs at this time and will defer further needs to OT and SLP. Pt aware of memory, problem solving and word finding difficulties end of session. Will sign off with pt agreeable.     Follow Up Recommendations No PT follow up;Supervision/Assistance - 24 hour    Equipment Recommendations  None recommended by PT    Recommendations for Other Services       Precautions / Restrictions Precautions Precaution Comments: difficulty with word finding, problem solving and memory      Mobility  Bed Mobility Overal bed mobility: Modified Independent                Transfers Overall transfer level: Modified independent                  Ambulation/Gait Ambulation/Gait assistance: Supervision Ambulation Distance (Feet): 150 Feet Assistive device: None Gait Pattern/deviations: Step-through pattern;Decreased stride length   Gait velocity interpretation: Below normal speed for age/gender General Gait Details: pt with slow gait and reports that is baseline, pt able to increase speed with cues. Pt unable to remember room number, could locate it with cues but requires assist for direction  Stairs            Wheelchair Mobility    Modified Rankin (Stroke Patients Only) Modified Rankin (Stroke Patients Only) Pre-Morbid Rankin Score: No  symptoms Modified Rankin: Moderate disability     Balance Overall balance assessment: No apparent balance deficits (not formally assessed)                                           Pertinent Vitals/Pain Pain Assessment: No/denies pain    Home Living Family/patient expects to be discharged to:: Private residence Living Arrangements: Alone Available Help at Discharge: Family;Available PRN/intermittently Type of Home: Apartment Home Access: Level entry     Home Layout: One level Home Equipment: None      Prior Function Level of Independence: Independent               Hand Dominance        Extremity/Trunk Assessment   Upper Extremity Assessment Upper Extremity Assessment: Defer to OT evaluation    Lower Extremity Assessment Lower Extremity Assessment: Overall WFL for tasks assessed LLE Deficits / Details: hip flexion 4/5, knee flexion and extension 5/5    Cervical / Trunk Assessment Cervical / Trunk Assessment: Normal  Communication   Communication: No difficulties  Cognition Arousal/Alertness: Awake/alert Behavior During Therapy: Flat affect Overall Cognitive Status: Impaired/Different from baseline Area of Impairment: Orientation;Memory;Safety/judgement;Problem solving                 Orientation Level: Disoriented to;Time Current Attention Level: Selective Memory: Decreased short-term memory   Safety/Judgement: Decreased awareness of deficits   Problem Solving: Slow processing General Comments: pt with difficulty recalling where  she works, year, unable to remember room number, difficulty recalling what she does for work, startled by presence on right      General Comments      Exercises     Assessment/Plan    PT Assessment Patent does not need any further PT services  PT Problem List         PT Treatment Interventions      PT Goals (Current goals can be found in the Care Plan section)  Acute Rehab PT Goals PT  Goal Formulation: All assessment and education complete, DC therapy    Frequency     Barriers to discharge        Co-evaluation               AM-PAC PT "6 Clicks" Daily Activity  Outcome Measure Difficulty turning over in bed (including adjusting bedclothes, sheets and blankets)?: None Difficulty moving from lying on back to sitting on the side of the bed? : None Difficulty sitting down on and standing up from a chair with arms (e.g., wheelchair, bedside commode, etc,.)?: None Help needed moving to and from a bed to chair (including a wheelchair)?: A Little Help needed walking in hospital room?: A Little Help needed climbing 3-5 steps with a railing? : A Little 6 Click Score: 21    End of Session Equipment Utilized During Treatment: Gait belt Activity Tolerance: Patient tolerated treatment well Patient left: in chair;with call bell/phone within reach;Other (comment)(with OT) Nurse Communication: Mobility status PT Visit Diagnosis: Other symptoms and signs involving the nervous system (V67.014)    Time: 1030-1314 PT Time Calculation (min) (ACUTE ONLY): 21 min   Charges:   PT Evaluation $PT Eval Moderate Complexity: 1 Mod     PT G Codes:        Elwyn Reach, PT [phone]   Darald Uzzle B Ibtisam Benge 04/18/2017, 10:35 AM

## 2017-04-18 NOTE — H&P (View-Only) (Signed)
Reason for Consult: ? Hematemesis Referring Physician: Neurology  Erin Dean HPI: This is a 52 year old female admitted for a code stroke.  She was noted to have expressive aphasia at work and EMS was called.  The patient has a history of HTN and upon evaluation she was in a hypertensive crisis treated with labetolol.  Full dose tPA was administered and when an MRI was attempted she complained about abdominal pain as well as SOB.  A minor vomiting episode occurred and the material was dark.  This was tested with a gastroccult, which was positive.  Her HGB has dropped by 2 grams and on admission it was at 10 g/dL.  Protonix was started.  She denies any issues with GERD, nausea, vomiting, or dysphagia.  There is no prior history of a GI bleed.  Past Medical History:  Diagnosis Date  . Hypertension     Past Surgical History:  Procedure Laterality Date  . CESAREAN SECTION      Family History  Problem Relation Age of Onset  . Heart attack Maternal Uncle   . Heart attack Maternal Grandfather   . Stroke Paternal Grandmother     Social History:  reports that  has never smoked. she has never used smokeless tobacco. She reports that she does not drink alcohol or use drugs.  Allergies: No Known Allergies  Medications:  Scheduled: .  stroke: mapping our early stages of recovery book   Does not apply Once  . acetaminophen  650 mg Oral Once  . amLODipine  10 mg Oral Daily  . carvedilol  6.25 mg Oral BID  . hydrALAZINE  25 mg Oral TID  . pantoprazole (PROTONIX) IV  40 mg Intravenous QHS   Continuous: . calcium gluconate    . clevidipine Stopped (04/18/17 1100)    Results for orders placed or performed during the hospital encounter of 04/17/17 (from the past 24 hour(s))  Magnesium     Status: None   Collection Time: 04/17/17  4:48 PM  Result Value Ref Range   Magnesium 1.9 1.7 - 2.4 mg/dL  Hemoglobin and hematocrit, blood     Status: Abnormal   Collection Time: 04/17/17  5:12 PM   Result Value Ref Range   Hemoglobin 9.5 (L) 12.0 - 15.0 g/dL   HCT 31.1 (L) 36.0 - 46.0 %  Troponin I (q 6hr x 3)     Status: Abnormal   Collection Time: 04/17/17  5:12 PM  Result Value Ref Range   Troponin I 0.29 (HH) <0.03 ng/mL  MRSA PCR Screening     Status: None   Collection Time: 04/17/17 11:10 PM  Result Value Ref Range   MRSA by PCR NEGATIVE NEGATIVE  Brain natriuretic peptide     Status: Abnormal   Collection Time: 04/18/17  1:32 AM  Result Value Ref Range   B Natriuretic Peptide 1,262.2 (H) 0.0 - 100.0 pg/mL  Troponin I (q 6hr x 3)     Status: Abnormal   Collection Time: 04/18/17  1:32 AM  Result Value Ref Range   Troponin I 0.27 (HH) <0.03 ng/mL  CBC     Status: Abnormal   Collection Time: 04/18/17  1:32 AM  Result Value Ref Range   WBC 11.5 (H) 4.0 - 10.5 K/uL   RBC 3.32 (L) 3.87 - 5.11 MIL/uL   Hemoglobin 8.5 (L) 12.0 - 15.0 g/dL   HCT 28.5 (L) 36.0 - 46.0 %   MCV 85.8 78.0 - 100.0 fL  MCH 25.6 (L) 26.0 - 34.0 pg   MCHC 29.8 (L) 30.0 - 36.0 g/dL   RDW 16.0 (H) 11.5 - 15.5 %   Platelets 267 150 - 400 K/uL  Hemoglobin A1c     Status: None   Collection Time: 04/18/17  1:32 AM  Result Value Ref Range   Hgb A1c MFr Bld 5.2 4.8 - 5.6 %   Mean Plasma Glucose 102.54 mg/dL  Lipid panel     Status: Abnormal   Collection Time: 04/18/17  1:32 AM  Result Value Ref Range   Cholesterol 185 0 - 200 mg/dL   Triglycerides 73 <150 mg/dL   HDL 41 >40 mg/dL   Total CHOL/HDL Ratio 4.5 RATIO   VLDL 15 0 - 40 mg/dL   LDL Cholesterol 129 (H) 0 - 99 mg/dL  Hemoglobin and hematocrit, blood     Status: Abnormal   Collection Time: 04/18/17 12:58 PM  Result Value Ref Range   Hemoglobin 8.0 (L) 12.0 - 15.0 g/dL   HCT 27.3 (L) 36.0 - 46.0 %     Mr [employer] Wo Contrast  Result Date: 04/18/2017 CLINICAL DATA:  Acute development of speech disturbance beginning yesterday. EXAM: MRI HEAD WITHOUT CONTRAST MRA HEAD WITHOUT CONTRAST TECHNIQUE: Multiplanar, multiecho pulse sequences of  the brain and surrounding structures were obtained without intravenous contrast. Angiographic images of the head were obtained using MRA technique without contrast. COMPARISON:  CT 04/17/2017 FINDINGS: MRI HEAD FINDINGS Brain: 5 cm region of acute infarction affecting the left temporoparietal junction consistent with left M3 branch occlusion infarction. Mild swelling. No hemorrhage or mass effect. The remainder the brain shows some chronic small-vessel ischemic changes of the hemispheric white matter on each side. No other large vessel territory infarction. No mass, hydrocephalus or extra-axial collection. Vascular: Major vessels at the base of the brain show flow. Skull and upper cervical spine: Negative Sinuses/Orbits: Clear/normal Other: None MRA HEAD FINDINGS Both internal carotid arteries are widely patent through the skull base and siphon regions. The anterior and middle cerebral vessels are patent without proximal stenosis, aneurysm or vascular malformation. There is probably a missing M3 branch posteriorly on the left. Left vertebral artery is dominant and widely patent to the basilar. The right vertebral artery is a small irregular vessel. There is probably acquired stenosis. No basilar stenosis. Superior cerebellar and posterior cerebral arteries appear normal. IMPRESSION: Acute infarction in a left posterior M3 branch distribution affecting the temporoparietal junction, measuring about 5 cm in size. Swelling but no hemorrhage or mass effect. Mild chronic small-vessel ischemic changes elsewhere within the hemispheric white matter. No large vessel anterior circulation finding. Probable missing posterior M3 branch on the left. Stenotic distal right vertebral artery. Electronically Signed   By: Nelson Chimes M.D.   On: 04/18/2017 12:32   Mr Brain Wo Contrast  Result Date: 04/18/2017 CLINICAL DATA:  Acute development of speech disturbance beginning yesterday. EXAM: MRI HEAD WITHOUT CONTRAST MRA HEAD WITHOUT  CONTRAST TECHNIQUE: Multiplanar, multiecho pulse sequences of the brain and surrounding structures were obtained without intravenous contrast. Angiographic images of the head were obtained using MRA technique without contrast. COMPARISON:  CT 04/17/2017 FINDINGS: MRI HEAD FINDINGS Brain: 5 cm region of acute infarction affecting the left temporoparietal junction consistent with left M3 branch occlusion infarction. Mild swelling. No hemorrhage or mass effect. The remainder the brain shows some chronic small-vessel ischemic changes of the hemispheric white matter on each side. No other large vessel territory infarction. No mass, hydrocephalus or extra-axial collection. Vascular:  Major vessels at the base of the brain show flow. Skull and upper cervical spine: Negative Sinuses/Orbits: Clear/normal Other: None MRA HEAD FINDINGS Both internal carotid arteries are widely patent through the skull base and siphon regions. The anterior and middle cerebral vessels are patent without proximal stenosis, aneurysm or vascular malformation. There is probably a missing M3 branch posteriorly on the left. Left vertebral artery is dominant and widely patent to the basilar. The right vertebral artery is a small irregular vessel. There is probably acquired stenosis. No basilar stenosis. Superior cerebellar and posterior cerebral arteries appear normal. IMPRESSION: Acute infarction in a left posterior M3 branch distribution affecting the temporoparietal junction, measuring about 5 cm in size. Swelling but no hemorrhage or mass effect. Mild chronic small-vessel ischemic changes elsewhere within the hemispheric white matter. No large vessel anterior circulation finding. Probable missing posterior M3 branch on the left. Stenotic distal right vertebral artery. Electronically Signed   By: Nelson Chimes M.D.   On: 04/18/2017 12:32   Dg Chest Portable 1 View  Result Date: 04/17/2017 CLINICAL DATA:  Shortness of Breath EXAM: PORTABLE CHEST 1  VIEW COMPARISON:  None. FINDINGS: Cardiomegaly with vascular congestion and bilateral perihilar and lower lobe opacities compatible with edema/CHF. No visible effusions or acute bony abnormality. IMPRESSION: Mild to moderate CHF. Electronically Signed   By: Rolm Baptise M.D.   On: 04/17/2017 11:56   Ct Head Code Stroke Wo Contrast  Result Date: 04/17/2017 CLINICAL DATA:  Code stroke.  Aphasia with blurred vision. EXAM: CT HEAD WITHOUT CONTRAST TECHNIQUE: Contiguous axial images were obtained from the base of the skull through the vertex without intravenous contrast. COMPARISON:  09/22/2012. FINDINGS: Brain: Cortically based areas of hypoattenuation affecting the LEFT posterior parietal and superior occipital lobes, also with involvement of the underlying white matter. No associated hemorrhage. No mass lesion, hydrocephalus, or extra-axial fluid. Normal for age cerebral volume. Vascular: No hyperdense vessel or unexpected calcification. Skull: Normal. Negative for fracture or focal lesion. Sinuses/Orbits: No acute finding. Other: None. ASPECTS [facility] Stroke Program Early CT Score) - Ganglionic level infarction (caudate, lentiform nuclei, internal capsule, insula, M1-M3 cortex): 7 - Supraganglionic infarction (M4-M6 cortex): 2 Total score (0-10 with 10 being normal): 9 IMPRESSION: 1. Acute nonhemorrhagic infarction affecting the RIGHT occipital cortex and underlying white matter, as described above.Vascular distribution corresponds to the posterior-most M3 division LEFT MCA. 2. ASPECTS is 9. These results were called by telephone at the time of interpretation on 04/17/2017 at 9:51 a.m. to Dr. Leonel Ramsay , who verbally acknowledged these results. Electronically Signed   By: Staci Righter M.D.   On: 04/17/2017 10:05    ROS:  As stated above in the HPI otherwise negative.  Blood pressure (!) 174/77, pulse 72, temperature 97.8 F (36.6 C), temperature source Oral, resp. rate (!) 22, height 5\' 7"  (1.702 m),  weight 115.3 kg (254 lb 3.1 oz), SpO2 100 %.    PE: Gen: NAD, arousable (patient was given Ativan for her MRI) HEENT:  [place]/AT, EOMI Neck: Supple, no LAD Lungs: CTA Bilaterally CV: RRR without M/G/R ABM: Soft, NTND, +BS Ext: No C/C/E  Assessment/Plan: 1) ? Hematemesis. 2) Anemia. 3) Left temporoparietal CVA    The patient is much better today.  She does not have expressive aphasia at this time.  Her vital signs are stable, but her HGB has dropped.  No further hematemesis, hematochezia, or melena.  Further evaluation will be performed with an EGD.  Plan: 1) EGD tomorrow. Mariame Rybolt D 04/18/2017, 2:04 PM

## 2017-04-18 NOTE — Progress Notes (Signed)
Carotid duplex prelim: 1-39% ICA stenosis.  Adain Geurin Eunice, RDMS, RVT   

## 2017-04-19 ENCOUNTER — Inpatient Hospital Stay (HOSPITAL_COMMUNITY): Payer: BLUE CROSS/BLUE SHIELD | Admitting: Anesthesiology

## 2017-04-19 ENCOUNTER — Encounter (HOSPITAL_COMMUNITY)
Admission: EM | Disposition: A | Discharge status: Home / Self Care | Payer: Self-pay | Source: Home / Self Care | Attending: Neurology

## 2017-04-19 ENCOUNTER — Encounter (HOSPITAL_COMMUNITY): Payer: Self-pay

## 2017-04-19 ENCOUNTER — Inpatient Hospital Stay (HOSPITAL_COMMUNITY): Payer: BLUE CROSS/BLUE SHIELD

## 2017-04-19 DIAGNOSIS — I161 Hypertensive emergency: Secondary | ICD-10-CM

## 2017-04-19 DIAGNOSIS — I6389 Other cerebral infarction: Secondary | ICD-10-CM

## 2017-04-19 DIAGNOSIS — I639 Cerebral infarction, unspecified: Secondary | ICD-10-CM

## 2017-04-19 DIAGNOSIS — I517 Cardiomegaly: Secondary | ICD-10-CM

## 2017-04-19 HISTORY — PX: TEE WITHOUT CARDIOVERSION: SHX5443

## 2017-04-19 HISTORY — PX: ESOPHAGOGASTRODUODENOSCOPY: SHX5428

## 2017-04-19 LAB — BASIC METABOLIC PANEL
Anion gap: 10 (ref 5–15)
BUN: 38 mg/dL — AB (ref 6–20)
CHLORIDE: 108 mmol/L (ref 101–111)
CO2: 22 mmol/L (ref 22–32)
Calcium: 8.9 mg/dL (ref 8.9–10.3)
Creatinine, Ser: 3.65 mg/dL — ABNORMAL HIGH (ref 0.44–1.00)
GFR calc Af Amer: 15 mL/min — ABNORMAL LOW (ref >60)
GFR calc non Af Amer: 13 mL/min — ABNORMAL LOW (ref >60)
GLUCOSE: 97 mg/dL (ref 65–99)
POTASSIUM: 3.7 mmol/L (ref 3.5–5.1)
Sodium: 140 mmol/L (ref 135–145)

## 2017-04-19 LAB — TSH: TSH: 2.428 u[IU]/mL (ref 0.350–4.500)

## 2017-04-19 LAB — HEMOGLOBIN AND HEMATOCRIT, BLOOD
HCT: 28.6 % — ABNORMAL LOW (ref 36.0–46.0)
HEMATOCRIT: 26.5 % — AB (ref 36.0–46.0)
Hemoglobin: 8.1 g/dL — ABNORMAL LOW (ref 12.0–15.0)
Hemoglobin: 8.5 g/dL — ABNORMAL LOW (ref 12.0–15.0)

## 2017-04-19 LAB — PROTIME-INR
INR: 1.23
Prothrombin Time: 15.4 seconds — ABNORMAL HIGH (ref 11.4–15.2)

## 2017-04-19 LAB — CBC
HEMATOCRIT: 29.1 % — AB (ref 36.0–46.0)
Hemoglobin: 8.6 g/dL — ABNORMAL LOW (ref 12.0–15.0)
MCH: 25.6 pg — ABNORMAL LOW (ref 26.0–34.0)
MCHC: 29.6 g/dL — ABNORMAL LOW (ref 30.0–36.0)
MCV: 86.6 fL (ref 78.0–100.0)
PLATELETS: 230 10*3/uL (ref 150–400)
RBC: 3.36 MIL/uL — AB (ref 3.87–5.11)
RDW: 16.1 % — AB (ref 11.5–15.5)
WBC: 8.2 10*3/uL (ref 4.0–10.5)

## 2017-04-19 LAB — PHOSPHORUS: Phosphorus: 4.4 mg/dL (ref 2.5–4.6)

## 2017-04-19 LAB — MAGNESIUM: Magnesium: 2 mg/dL (ref 1.7–2.4)

## 2017-04-19 SURGERY — EGD (ESOPHAGOGASTRODUODENOSCOPY)
Anesthesia: Monitor Anesthesia Care

## 2017-04-19 SURGERY — ECHOCARDIOGRAM, TRANSESOPHAGEAL
Anesthesia: Monitor Anesthesia Care

## 2017-04-19 MED ORDER — ONDANSETRON HCL 4 MG/2ML IJ SOLN
INTRAMUSCULAR | Status: DC | PRN
  Administered 2017-04-19: 4 mg via INTRAVENOUS

## 2017-04-19 MED ORDER — SODIUM CHLORIDE 0.9 % IV SOLN: INTRAVENOUS | Status: DC

## 2017-04-19 MED ORDER — INFLUENZA VAC SPLIT QUAD 0.5 ML IM SUSY
0.5000 mL | PREFILLED_SYRINGE | INTRAMUSCULAR | Status: DC
Start: 2017-04-20 — End: 2017-04-23

## 2017-04-19 MED ORDER — LABETALOL HCL 5 MG/ML IV SOLN
INTRAVENOUS | Status: AC
  Filled 2017-04-19: qty 4

## 2017-04-19 MED ORDER — SODIUM CHLORIDE 0.9 % IV SOLN
INTRAVENOUS | Status: DC | PRN
  Administered 2017-04-19 (×2): via INTRAVENOUS

## 2017-04-19 MED ORDER — HYDRALAZINE HCL 50 MG PO TABS
50.0000 mg | ORAL_TABLET | Freq: Three times a day (TID) | ORAL | Status: DC
  Administered 2017-04-20 (×3): 50 mg via ORAL
  Filled 2017-04-19 (×3): qty 1

## 2017-04-19 MED ORDER — PROPOFOL 10 MG/ML IV BOLUS
INTRAVENOUS | Status: DC | PRN
  Administered 2017-04-19: 20 mg via INTRAVENOUS
  Administered 2017-04-19: 15 mg via INTRAVENOUS
  Administered 2017-04-19: 20 mg via INTRAVENOUS

## 2017-04-19 MED ORDER — ASPIRIN EC 325 MG PO TBEC
325.0000 mg | DELAYED_RELEASE_TABLET | Freq: Every day | ORAL | Status: DC
  Administered 2017-04-19 – 2017-04-23 (×4): 325 mg via ORAL
  Filled 2017-04-19 (×5): qty 1

## 2017-04-19 MED ORDER — SODIUM CHLORIDE 0.9 % IV SOLN
INTRAVENOUS | Status: DC
  Administered 2017-04-19 – 2017-04-20 (×2): via INTRAVENOUS

## 2017-04-19 MED ORDER — PANTOPRAZOLE SODIUM 40 MG PO TBEC
40.0000 mg | DELAYED_RELEASE_TABLET | Freq: Every day | ORAL | Status: DC
  Administered 2017-04-19 – 2017-04-23 (×4): 40 mg via ORAL
  Filled 2017-04-19 (×5): qty 1

## 2017-04-19 MED ORDER — EPHEDRINE SULFATE 50 MG/ML IJ SOLN
INTRAMUSCULAR | Status: DC | PRN
  Administered 2017-04-19: 10 mg via INTRAVENOUS
  Administered 2017-04-19 (×2): 15 mg via INTRAVENOUS

## 2017-04-19 MED ORDER — HYDROCORTISONE 1 % EX CREA
TOPICAL_CREAM | Freq: Two times a day (BID) | CUTANEOUS | Status: DC
  Administered 2017-04-21 (×2): via TOPICAL
  Administered 2017-04-22: 1 via TOPICAL
  Filled 2017-04-19 (×2): qty 28

## 2017-04-19 MED ORDER — PROPOFOL 500 MG/50ML IV EMUL
INTRAVENOUS | Status: DC | PRN
  Administered 2017-04-19: 100 ug/kg/min via INTRAVENOUS

## 2017-04-19 NOTE — Evaluation (Signed)
Speech Language Pathology Evaluation Patient Details Name: Erin Dean MRN: [id number] DOB: 1966/02/01 Today's Date: 04/19/2017 Time: 7654-6503 SLP Time Calculation (min) (ACUTE ONLY): 22 min  Problem List:  Patient Active Problem List   Diagnosis Date Noted  . Cryptogenic stroke ([place])   . Hypertensive emergency   . Demand ischemia ([place])   . Stroke (cerebrum) ([place]) 04/17/2017  . Essential hypertension 12/01/2015  . Morbid obesity ([place]) 12/01/2015  . Lower extremity edema 12/01/2015  . Heart murmur 12/01/2015  . Hypertensive urgency 09/22/2012  . Anxiety 09/22/2012   Past Medical History:  Past Medical History:  Diagnosis Date  . Hypertension    Past Surgical History:  Past Surgical History:  Procedure Laterality Date  . CESAREAN SECTION    . ESOPHAGOGASTRODUODENOSCOPY N/A 04/19/2017   Procedure: ESOPHAGOGASTRODUODENOSCOPY (EGD);  Surgeon: Carol Ada, MD;  Location: [facility];  Service: Endoscopy;  Laterality: N/A;  . TEE WITHOUT CARDIOVERSION N/A 04/19/2017   Procedure: TRANSESOPHAGEAL ECHOCARDIOGRAM (TEE);  Surgeon: Carol Ada, MD;  Location: [facility];  Service: Endoscopy;  Laterality: N/A;   HPI:  52 year old left handed female who presented with aphasia, blurred vision, and headache ischemic infarct LEFT posterior parietal and superior occipital lobes. PMHx: HTN, heart murmur   Assessment / Plan / Recommendation Clinical Impression  Pt has cognitive impairments and higher level linguistic errors s/p acute CVA. Although her basic communication is functional, she has limited divergent naming ability and anomia during verbal sequencing tasks. Cognitively she is disoriented to time and has reduced working memory/retrieval of new information, but she also has difficulty remembering information like whart she does for work. She is emergently aware of her difficulties, becoming tearful as a result. Pt will benefit from OP SLP f/u and 24/7 supervision upon initial  return home.    SLP Assessment  SLP Recommendation/Assessment: Patient needs continued Speech Lanaguage Pathology Services SLP Visit Diagnosis: Cognitive communication deficit (R41.841);Aphasia (R47.01)    Follow Up Recommendations  Outpatient SLP;24 hour supervision/assistance    Frequency and Duration min 2x/week  2 weeks      SLP Evaluation Cognition  Overall Cognitive Status: Impaired/Different from baseline Arousal/Alertness: Awake/alert Orientation Level: Oriented to person;Oriented to place;Oriented to situation;Disoriented to time Attention: Selective Selective Attention: Impaired Selective Attention Impairment: Verbal basic Memory: Impaired Memory Impairment: Retrieval deficit;Decreased recall of new information;Other (comment)(working memory) Awareness: Appears intact(intellectual and emergent, anticipatory not addressed) Problem Solving: Impaired Problem Solving Impairment: Verbal complex       Comprehension  Auditory Comprehension Overall Auditory Comprehension: Appears within functional limits for tasks assessed    Expression Expression Primary Mode of Expression: Verbal Verbal Expression Overall Verbal Expression: Impaired Initiation: No impairment Automatic Speech: Name;Social Response Level of Generative/Spontaneous Verbalization: Conversation Naming: Impairment Confrontation: Within functional limits Divergent: 50-74% accurate Verbal Errors: Aware of errors Pragmatics: No impairment Non-Verbal Means of Communication: Not applicable   Oral / Motor  Motor Speech Overall Motor Speech: Appears within functional limits for tasks assessed   GO                    Germain Osgood 04/19/2017, 4:20 PM  Germain Osgood, M.A. CCC-SLP [phone]

## 2017-04-19 NOTE — Anesthesia Postprocedure Evaluation (Signed)
Anesthesia Post Note  Patient: OVIYA AMMAR  Procedure(s) Performed: TRANSESOPHAGEAL ECHOCARDIOGRAM (TEE) (N/A ) ESOPHAGOGASTRODUODENOSCOPY (EGD) (N/A )     Patient location during evaluation: Endoscopy Anesthesia Type: MAC Level of consciousness: awake and alert, patient cooperative and oriented Pain management: pain level controlled Vital Signs Assessment: post-procedure vital signs reviewed and stable Respiratory status: spontaneous breathing, nonlabored ventilation, respiratory function stable and patient connected to nasal cannula oxygen Cardiovascular status: blood pressure returned to baseline and stable Postop Assessment: no apparent nausea or vomiting Anesthetic complications: no    Last Vitals:  Vitals:   04/19/17 1022 04/19/17 1023  BP:  (!) 115/39  Pulse: 69 69  Resp: (!) 24 17  Temp:    SpO2: (!) 88% 94%    Last Pain:  Vitals:   04/19/17 1011  TempSrc: Oral  PainSc:                  Mer[place]ees Convery,E. Adric Wrede

## 2017-04-19 NOTE — Progress Notes (Addendum)
Occupational Therapy Treatment Patient Details Name: Erin Dean MRN: [id number] DOB: [birth year] Today's Date: 04/19/2017    History of present illness 52 year old left handed female who presented with aphasia, blurred vision, and headache ischemic infarct LEFT posterior parietal and superior occipital lobes. PMHx: HTN, heart murmur   OT comments  Focus of session on educating pt in ramifications of patient's cognitive deficits in IADL, home safety and compensatory strategies. Pt receptive. Tearful initially, but then reporting she felt better at end of session. Updated d/c disposition to [place]. Pt needs to learn to function in her home setting first before going to neuro OPOT. Until pt can show that she is functioning safely alone.  Follow Up Recommendations  Home Health OT, 24 hour supervision initially   Equipment Recommendations  None recommended by OT    Recommendations for Other Services      Precautions / Restrictions Precautions Precaution Comments: difficulty with word finding, problem solving and memory       Mobility Bed Mobility Overal bed mobility: Modified Independent                Transfers Overall transfer level: Modified independent                    Balance                                           ADL either performed or assessed with clinical judgement   ADL                                         General ADL Comments: Educated pt at length in compensatory strategies for memory impairment including use of a calendar, phone and paper reminders, pill boxes, setting a timer, use of devices to help locate lost items. Educated in safety considerations related to memory and processing deficits. Pt agreeable to having daughter assist with finances. Practiced use of menu and how placing phone call to order dinner. Pt needing moderate verbal cues.      Vision   Additional Comments: all visual fields appear  intact   Perception     Praxis      Cognition Arousal/Alertness: Awake/alert Behavior During Therapy: WFL for tasks assessed/performed(tearful at times) Overall Cognitive Status: Impaired/Different from baseline Area of Impairment: Memory;Safety/judgement;Problem solving                     Memory: Decreased short-term memory   Safety/Judgement: Decreased awareness of deficits   Problem Solving: Slow processing;Difficulty sequencing;Requires verbal cues General Comments: pt able to state date correctly        Exercises     Shoulder Instructions       General Comments      Pertinent Vitals/ Pain       Pain Assessment: No/denies pain Faces Pain Scale: No hurt  Home Living       Type of Home: Apartment                              Lives With: Alone;Other (Comment)(daughter, boyfriend intermittently stay with her)    Prior Functioning/Environment              Frequency  Min 2X/week        Progress Toward Goals  OT Goals(current goals can now be found in the care plan section)  Progress towards OT goals: Progressing toward goals  Acute Rehab OT Goals Patient Stated Goal: to return to being normal OT Goal Formulation: With patient Time For Goal Achievement: 05/02/17 Potential to Achieve Goals: Good  Plan Discharge plan remains appropriate    Co-evaluation                 AM-PAC PT "6 Clicks" Daily Activity     Outcome Measure   Help from another person eating meals?: None Help from another person taking care of personal grooming?: None Help from another person toileting, which includes using toliet, bedpan, or urinal?: None Help from another person bathing (including washing, rinsing, drying)?: None Help from another person to put on and taking off regular upper body clothing?: None Help from another person to put on and taking off regular lower body clothing?: None 6 Click Score: 24    End of Session Equipment  Utilized During Treatment: Gait belt  OT Visit Diagnosis: Other symptoms and signs involving cognitive function   Activity Tolerance Patient tolerated treatment well   Patient Left in bed;with call bell/phone within reach   Nurse Communication          Time: 6168-3729 OT Time Calculation (min): 26 min  Charges: OT General Charges $OT Visit: 1 Visit OT Treatments $Self Care/Home Management : 23-37 mins  04/19/2017 Nestor Lewandowsky, OTR/L Pager: [place], Haze [place] 04/19/2017, 4:20 PM

## 2017-04-19 NOTE — Progress Notes (Signed)
Patient taken to Endo for ERCP and TEE alert and oriented.  Patient denies and complaints of pain at this time.

## 2017-04-19 NOTE — Progress Notes (Deleted)
  Echocardiogram 2D Echocardiogram has been performed.  Merrie Roof F 04/19/2017, 12:00 PM

## 2017-04-19 NOTE — Anesthesia Procedure Notes (Signed)
Procedure Name: MAC Date/Time: 04/19/2017 9:38 AM Performed by: White, Amedeo Plenty, CRNA Pre-anesthesia Checklist: Patient identified, Emergency Drugs available, Suction available and Patient being monitored Patient Re-evaluated:Patient Re-evaluated prior to induction Oxygen Delivery Method: Nasal cannula

## 2017-04-19 NOTE — Progress Notes (Signed)
PULMONARY / CRITICAL CARE MEDICINE   Name: Erin Dean MRN: [id number] DOB: 1966-04-02    ADMISSION DATE:  04/17/2017 CONSULTATION DATE:  04/17/2017  REFERRING MD:  Dr. Katherine Roan   CHIEF COMPLAINT:  CVA/ ? UGIB/ AKI/ +troponin  HISTORY OF PRESENT ILLNESS:   52 year old left handed female with PMH significant for hypertension and heart murmur who presented with aphasia, blurred vision, and headache. 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  In the ER, she arrived as a code stroke.  Noted to be hypertensive 270/140.  NIHSS 4 with severe expressive aphasia.  Treated with labetalol and placed on cleviprex gtt.  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  Given full dose TPA at 0955 with marked improvment.  CTA deferred due to AKI.  MRA attempted however patient could not lay flat due to shortness of breath and therefore aborted. She then complained of abdominal pain and had small volume emesis, dark per patient;  occult positive.  Placed on Protonix drip and PCCM consulted for medical management.  Denies history of smoking, alcohol, or illicit drug abuse.  Her hypertension is managed by her primary physician at [facility].    SUBJECTIVE:  No acute events.  Speech returned to normal, no deficits.  Asking for ice cream.  VITAL SIGNS: BP 139/66   Pulse 62   Temp 97.7 F (36.5 C) (Oral)   Resp 20   Ht 5\' 7"  (1.702 m)   Wt 257 lb 15 oz (117 kg)   SpO2 95%   BMI 40.40 kg/m   HEMODYNAMICS:    VENTILATOR SETTINGS:    INTAKE / OUTPUT: I/O last 3 completed shifts: In: 6 [I.V.:6] Out: 1900 [Urine:1900]  PHYSICAL EXAMINATION: General: obese adult female in NAD HEENT: MM pink/moist PSY: calm/appropriate  Neuro: AAOx4, speech clear, MAE CV: s1s2 rrr, no m/r/g PULM: even/non-labored, lungs bilaterally clear YJ:EHUD, non-tender, bsx4 active  Extremities: warm/dry, no edema  Skin: no rashes  or lesions  LABS:  BMET Recent Labs  Lab 04/17/17 0937 04/17/17 0944 04/19/17 0752  NA 137 141 140  K 3.9 4.0 3.7  CL 107 109 108  CO2 19*  --  22  BUN 40* 36* 38*  CREATININE 3.22* 3.40* 3.65*  GLUCOSE 108* 107* 97    Electrolytes Recent Labs  Lab 04/17/17 0937 04/17/17 1648 04/19/17 0752  CALCIUM 8.8*  --  8.9  MG  --  1.9 2.0  PHOS  --   --  4.4    CBC Recent Labs  Lab 04/17/17 0937  04/18/17 0132  04/18/17 1935 04/19/17 0039 04/19/17 0752  WBC 7.4  --  11.5*  --   --   --   --   HGB 9.3*   < > 8.5*   < > 8.9* 8.1* 8.5*  HCT 30.7*   < > 28.5*   < > 29.8* 26.5* 28.6*  PLT 257  --  267  --   --   --   --    < > = values in this interval not displayed.    Coag's Recent Labs  Lab 04/17/17 0937 04/19/17 0752  APTT 32  --   INR 1.08 1.23    Sepsis Markers No results for input(s): LATICACIDVEN, PROCALCITON, O2SATVEN in the last 168 hours.  ABG No results for input(s): PHART, PCO2ART, PO2ART in the last 168 hours.  Liver Enzymes Recent Labs  Lab  04/17/17 0937  AST 19  ALT 9*  ALKPHOS 98  BILITOT 0.7  ALBUMIN 3.4*    Cardiac Enzymes Recent Labs  Lab 04/17/17 1712 04/18/17 0132  TROPONINI 0.29* 0.27*    Glucose Recent Labs  Lab 04/17/17 0938  GLUCAP 111*    Imaging No results found.   STUDIES:  1/9 CT head code stroke >> 1. Acute nonhemorrhagic infarction affecting the RIGHT occipital cortex and underlying white matter, as described above.Vascular distribution corresponds to the posterior-most M3 division LEFT MCA. 2. ASPECTS is 9.  MRA / MRI head 1/10 >> acute infarct in the left posterior M3 branch distribution, about 5 cm in size, mild chronci small vessel ischemic changes, no large vessel anterior circulation finding  CULTURES:   ANTIBIOTICS:   SIGNIFICANT EVENTS: 1/09  Admit, CVA s/p TPA 1/11  Improved, speech at baseline  LINES/TUBES: PIV x 2  DISCUSSION: 60 yoF w/PMH of HTN and murmur presenting with acute  aphasia and severe hypertension 270/140, head CT showing old infarct, placed on cleviprex and given TPA with improvement.  Developed abd pain with episode of dark emesis, +occult, and SOB, unable to lay flat for MRA head.   ASSESSMENT / PLAN:  PULMONARY A:  Acute hypoxic respiratory failure related to pulmonary edema - resolved P:  Consider outpatient work up for OSA Pulmonary hygiene   CARDIOVASCULAR A:  HTN crisis - suspect pt has very poorly controlled HTN at home, previous 3 med regimen, r/o for RAS previously Acute CVA Elevated troponin Prolonged QTc on EKG with nonspecific changes   Murmur/ mild MR - last TTE 12/2015 w/ EF 55%, mild LVH, G2DD, trivial AR, mild MR, normal RV size and function, previously seen by Dr. Irish Lack in 2017 P:  Tele monitoring  BP parameters per Neuro  RENAL A:  AKI - unclear baseline ( 2.25 in 12/2015)  P:  Trend BMP / urinary output Replace electrolytes as indicated Avoid nephrotoxic agents, ensure adequate renal perfusion  GASTROINTESTINAL A:  Concern for UGIB s/p TPA - dark emesis per patient x 1, + gastric occult, EGD with 3cm hiatal hernia P:  Resume diet as tolerated  Appreciate GI assistance  HEMATOLOGIC A:  Anemia- appears chronic  P:  Trend CBC  NEUROLOGIC A:  Acute CVA- suspected small cortical infarct s/p TPA with improvement in expressive aphasia  P:  Per Neuro  PT / OT efforts    FAMILY  - Updates: Patient and family updated at bedside.    PCCM will sign off. Please call back if new needs arise.   Noe Gens, NP-C [facility] Pgr: [phone] or if no answer [phone] 04/19/2017, 12:59 PM

## 2017-04-19 NOTE — Transfer of Care (Signed)
Immediate Anesthesia Transfer of Care Note  Patient: Erin Dean  Procedure(s) Performed: TRANSESOPHAGEAL ECHOCARDIOGRAM (TEE) (N/A ) ESOPHAGOGASTRODUODENOSCOPY (EGD) (N/A )  Patient Location: Endoscopy Unit  Anesthesia Type:MAC  Level of Consciousness: awake, alert  and patient cooperative  Airway & Oxygen Therapy: Patient Spontanous Breathing and Patient connected to nasal cannula oxygen  Post-op Assessment: Report given to RN and Post -op Vital signs reviewed and stable  Post vital signs: Reviewed and stable  Last Vitals:  Vitals:   04/19/17 0857 04/19/17 1011  BP: (!) 211/80   Pulse:  77  Resp:  16  Temp:  36.6 C  SpO2:  98%    Last Pain:  Vitals:   04/19/17 1011  TempSrc: Oral  PainSc:          Complications: No apparent anesthesia complications

## 2017-04-19 NOTE — Progress Notes (Signed)
  Echocardiogram Echocardiogram Transesophageal has been performed.  Merrie Roof F 04/19/2017, 1:21 PM

## 2017-04-19 NOTE — Progress Notes (Signed)
EP service has been asked to evaluate patient for loop implant.  This is done typically the day of discharge.  The patient remains in ICU/stepdown with ongoing work up in progress.   Please notify EP service when discharge timing is known.  Tommye Standard, PA-C

## 2017-04-19 NOTE — Interval H&P Note (Signed)
History and Physical Interval Note:  04/19/2017 9:35 AM  Erin Dean  has presented today for surgery, with the diagnosis of STROKE  The various methods of treatment have been discussed with the patient and family. After consideration of risks, benefits and other options for treatment, the patient has consented to  Procedure(s): TRANSESOPHAGEAL ECHOCARDIOGRAM (TEE) (N/A) ESOPHAGOGASTRODUODENOSCOPY (EGD) (N/A) as a surgical intervention .  The patient's history has been reviewed, patient examined, no change in status, stable for surgery.  I have reviewed the patient's chart and labs.  Questions were answered to the patient's satisfaction.     Mickael Mcnutt D

## 2017-04-19 NOTE — Interval H&P Note (Signed)
History and Physical Interval Note:  04/19/2017 8:26 AM  Erin Dean  has presented today for surgery, with the diagnosis of STROKE  The various methods of treatment have been discussed with the patient and family. After consideration of risks, benefits and other options for treatment, the patient has consented to  Procedure(s): TRANSESOPHAGEAL ECHOCARDIOGRAM (TEE) (N/A) ESOPHAGOGASTRODUODENOSCOPY (EGD) (N/A) as a surgical intervention .  The patient's history has been reviewed, patient examined, no change in status, stable for surgery.  I have reviewed the patient's chart and labs.  Questions were answered to the patient's satisfaction.     [name]

## 2017-04-19 NOTE — Progress Notes (Signed)
OT Cancellation Note  Patient Details Name: Erin Dean MRN: [id number] DOB: May 21, 1965   Cancelled Treatment:    Reason Eval/Treat Not Completed: Patient at procedure or test/ unavailable(Pt in endo, will follow.)  [name] 04/19/2017, 8:45 AM  04/19/2017 Nestor Lewandowsky, OTR/L Pager: [phone]

## 2017-04-19 NOTE — Op Note (Signed)
INDICATIONS: stroke  PROCEDURE:   Informed consent was obtained prior to the procedure. The risks, benefits and alternatives for the procedure were discussed and the patient comprehended these risks.  Risks include, but are not limited to, cough, sore throat, vomiting, nausea, somnolence, esophageal and stomach trauma or perforation, bleeding, low blood pressure, aspiration, pneumonia, infection, trauma to the teeth and death.    After a procedural time-out, the oropharynx was anesthetized with 20% benzocaine spray.   During this procedure the patient was administered IV propofol by Anesthesiology.   The transesophageal probe was inserted in the esophagus and stomach without difficulty and multiple views were obtained.  The patient was kept under observation until the patient left the procedure room.  The patient left the procedure room in stable condition.   Agitated microbubble saline contrast was administered.  COMPLICATIONS:    There were no immediate complications.  FINDINGS:  No cardiac or aortic source of embolic stroke identified. The left ventricle is markedly hypertrophic. The right heart chambers, pulmonary artery and coronary sinus appear dilated, consistent with chronic pulmonary HTN. No ASD/PFO.  RECOMMENDATIONS:     Consider loop recorder.  Time Spent Directly with the Patient:  30 minutes   Erin Dean 04/19/2017, 10:37 AM

## 2017-04-19 NOTE — Op Note (Signed)
[facility] Patient Name: Erin Dean Procedure Date : 04/19/2017 MRN: [id number] Attending MD: Carol Ada , MD Date of Birth: Nov 30, 1965 CSN: [id number] Age: 52 Admit Type: Inpatient Procedure:                Upper GI endoscopy Indications:              Hematemesis Providers:                Carol Ada, MD, Angus Seller, William Dalton,                            Technician Referring MD:              Medicines:                Propofol per Anesthesia Complications:            No immediate complications. Estimated Blood Loss:     Estimated blood loss: none. Procedure:                Pre-Anesthesia Assessment:                           - Prior to the procedure, a History and Physical                            was performed, and patient medications and                            allergies were reviewed. The patient's tolerance of                            previous anesthesia was also reviewed. The risks                            and benefits of the procedure and the sedation                            options and risks were discussed with the patient.                            All questions were answered, and informed consent                            was obtained. Prior Anticoagulants: The patient has                            taken no previous anticoagulant or antiplatelet                            agents. ASA Grade Assessment: III - A patient with                            severe systemic disease. After reviewing the risks                            and benefits,  the patient was deemed in                            satisfactory condition to undergo the procedure.                           - Sedation was administered by an anesthesia                            professional. Deep sedation was attained.                           After obtaining informed consent, the endoscope was                            passed under direct vision. Throughout the                       procedure, the patient's blood pressure, pulse, and                            oxygen saturations were monitored continuously. The                            [id number] ([id number]) scope was introduced through the                            mouth, and advanced to the second part of duodenum.                            The upper GI endoscopy was accomplished without                            difficulty. The patient tolerated the procedure                            well. Scope In: Scope Out: Findings:      A 3 cm hiatal hernia was present.      The stomach was normal.      The examined duodenum was normal. Impression:               - 3 cm hiatal hernia.                           - Normal stomach.                           - Normal examined duodenum.                           - No specimens collected. Moderate Sedation:      None Recommendation:           - Return patient to hospital ward for ongoing care.                           - Resume previous diet.                           -  Continue present medications.                           - No further GI intervention. Procedure Code(s):        --- Professional ---                           [phone], Esophagogastroduodenoscopy, flexible,                            transoral; diagnostic, including collection of                            specimen(s) by brushing or washing, when performed                            (separate procedure) Diagnosis Code(s):        --- Professional ---                           K44.9, Diaphragmatic hernia without obstruction or                            gangrene                           K92.0, Hematemesis CPT copyright 2016 [employer]. All rights reserved. The codes documented in this report are preliminary and upon coder review may  be revised to meet current compliance requirements. Carol Ada, MD Carol Ada, MD 04/19/2017 10:00:44 AM This report has been signed  electronically. Number of Addenda: 0

## 2017-04-19 NOTE — Progress Notes (Signed)
NEUROHOSPITALISTS STROKE TEAM - DAILY PROGRESS NOTE   ADMISSION HISTORY: Erin Dean is a 52 y.o. female who awoke in her normal state of health and was at work at 5:30 AM when she developed difficulty speaking.  She states that it was an abrupt change in that she was normal before this.  She denies numbness or weakness.  On arrival, she had a severe expressive aphasia, but was able to answer yes/no questions with head nods/shakes.  She was given IV TPA.  She is an AK I with low NIH and therefore CTA was not done, MRA was attempted but she was too short of breath when laying flat and therefore this was not done.  She improved markedly with the IV TPA, but then  Began complaining of abdominal pain.  She started vomiting with concern for hematemesis.  LKW: 5:30 AM tpa given?:  Modified Rankin Score: 0  SUBJECTIVE (INTERVAL HISTORY) Several family members are  at the bedside. Patient is found laying in bed in NAD. Overall she feels her condition is rapidly improving. Voices no new complaints.  She had upper endoscopy by Dr. Saralyn Pilar on which showed no active bleeding source and only mild gastritis. Transesophageal echocardiogram showed no cardiac source of embolism or PFO.   OBJECTIVE Lab Results: CBC:  Recent Labs  Lab 04/17/17 0937  04/18/17 0132  04/19/17 0039 04/19/17 0752 04/19/17 1258  WBC 7.4  --  11.5*  --   --   --  8.2  HGB 9.3*   < > 8.5*   < > 8.1* 8.5* 8.6*  HCT 30.7*   < > 28.5*   < > 26.5* 28.6* 29.1*  MCV 84.6  --  85.8  --   --   --  86.6  PLT 257  --  267  --   --   --  230   < > = values in this interval not displayed.   BMP: Recent Labs  Lab 04/17/17 0937 04/17/17 0944 04/17/17 1648 04/19/17 0752  NA 137 141  --  140  K 3.9 4.0  --  3.7  CL 107 109  --  108  CO2 19*  --   --  22  GLUCOSE 108* 107*  --  97  BUN 40* 36*  --  38*  CREATININE 3.22* 3.40*  --  3.65*  CALCIUM 8.8*  --   --  8.9  MG   --   --  1.9 2.0  PHOS  --   --   --  4.4   Liver Function Tests:  Recent Labs  Lab 04/17/17 0937  AST 19  ALT 9*  ALKPHOS 98  BILITOT 0.7  PROT 8.2*  ALBUMIN 3.4*   Cardiac Enzymes:  Recent Labs  Lab 04/17/17 1712 04/18/17 0132  TROPONINI 0.29* 0.27*   Coagulation Studies:  Recent Labs    04/17/17 0937 04/19/17 0752  APTT 32  --   INR 1.08 1.23   PHYSICAL EXAM Temp:  [97.7 F (36.5 C)-98.7 F (37.1 C)] 97.7 F (36.5 C) (01/11 1153) Pulse Rate:  [54-77] 62 (01/11 1153) Resp:  [16-28] 20 (01/11 1153) BP: (109-238)/(39-101) 139/66 (01/11 1153) SpO2:  [88 %-100 %] 95 % (01/11 1153) Weight:  [257 lb 15 oz (117 kg)] 257 lb 15 oz (117 kg) (01/10 2227) General - Well nourished, well developed, in no apparent distress HEENT-  Normocephalic, Normal external eye/conjunctiva.  Normal external ears. Normal external nose, mucus membranes and septum.  Cardiovascular - Regular rate and rhythm  Respiratory - Lungs clear bilaterally. No wheezing. Abdomen - soft and non-tender, BS normal Extremities- no edema or cyanosis  Neuro Exam : Mental Status: Patient is awake, alert, she is able to answer questions, with mild expressive aphasia, Patient is able to give a clear and coherent history. No signs  neglect Cranial Nerves: II: Visual Fields are full. Pupils are equal, round, and reactive to light.   III,IV, VI: EOMI without ptosis or diploplia.  V: Facial sensation is symmetric to temperature VII: Facial movement is symmetric.  VIII: hearing is intact to voice X: Uvula elevates symmetrically XI: Shoulder shrug is symmetric. XII: tongue is midline without atrophy or fasciculations.  Motor: Tone is normal. Bulk is normal. 5/5 strength was present in all four extremities.  Sensory: Sensation is symmetric to light touch and temperature in the arms and legs. Cerebellar: FNF and HKS are intact bilaterally  IMAGING: I have personally reviewed the radiological images below  and agree with the radiology interpretations.  MRI/MRA Head/Brain Wo Contrast Result Date: 04/18/2017 IMPRESSION: Acute infarction in a left posterior M3 branch distribution affecting the temporoparietal junction, measuring about 5 cm in size. Swelling but no hemorrhage or mass effect. Mild chronic small-vessel ischemic changes elsewhere within the hemispheric white matter. No large vessel anterior circulation finding. Probable missing posterior M3 branch on the left. Stenotic distal right vertebral artery. Electronically Signed   By: Nelson Chimes M.D.   On: 04/18/2017 12:32   Dg Chest Portable 1 View Result Date: 04/17/2017  IMPRESSION: Mild to moderate CHF. Electronically Signed   By: Rolm Baptise M.D.   On: 04/17/2017 11:56   Ct Head Code Stroke Wo Contrast Result Date: 04/17/2017 IMPRESSION: 1. Acute nonhemorrhagic infarction affecting the RIGHT occipital cortex and underlying white matter, as described above.Vascular distribution corresponds to the posterior-most M3 division LEFT MCA. 2. ASPECTS is 9. These results were called by telephone at the time of interpretation on 04/17/2017 at 9:51 a.m. to Dr. Leonel Ramsay , who verbally acknowledged these results. Electronically Signed   By: Staci Righter M.D.   On: 04/17/2017 10:05   Echocardiogram:                                               Study Conclusions - Left ventricle: The cavity size was normal. Wall thickness was   normal. Systolic function was normal. The estimated ejection   fraction was in the range of 60% to 65%. - Aortic valve: There was mild regurgitation. - Mitral valve: There was mild regurgitation. - Left atrium: The atrium was moderately dilated. - Right atrium: The atrium was mildly dilated. - Atrial septum: No defect or patent foramen ovale was identified.  B/L Carotid U/S:                                                 Final Interpretation: Right Carotid: There is evidence in the right ICA of a 1-39% stenosis. Left  Carotid: There is evidence in the left ICA of a 1-39% stenosis. Vertebrals: Left vertebral artery was patent with antegrade flow. Right vertebral appears partially occluded.  TEE  normal Loop Recorder:  PENDING    IMPRESSION: [name] is a 52 y.o. female with PMH of HTN who presented with an acute onset of severe expressive aphasia, She improved markedly with the IV TPA, but then began complaining of abdominal pain.  She started vomiting with concern for hematemesis. Unfortunately, I would be very hesitant to reverse this even if there is some GI bleeding(Gastroccult pending) even her rapid improvement.  I suspect the elevated troponin is related to the stroke, no chest pain or EKG change, will trend.   Acute infarction in a left posterior M3 branch distribution affecting the temporoparietal junction, measuring about 5 cm in size of likely embolic etiology  Acute nonhemorrhagic infarction affecting the RIGHT occipital cortex and underlying white matter,   Suspected Etiology:   cardioembolic or cryptogenic  source, Resultant Symptoms: severe expressive aphasia, Stroke Risk Factors: hyperlipidemia and hypertension Other Stroke Risk Factors: Obesity, Body mass index is 40.4 kg/m.   Outstanding Stroke Work-up Studies:      Loop Recorder:                                            04/19/2017 ASSESSMENT:   Neuro exam stable. Mild expressive aphasia. Continues to have elevated B/P. MRI post tPA without evidence of bleeding. Appreciate CCM assistance with multiple medical problems.  Continue to monitor hematocrit. Mobilize out of bed. Therapy consults. 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  R/O AFIB TEE and Loop Recorder Placement in AM  Abnormal EKG- Prolonged QTc and Elevated Troponins Cardiology Consultation TEE in AM  MEDICAL ISSUES: CCM following, Appreciate Assistance Acute hypoxic respiratory failure related to pulmonary edema  HTN crisis - suspect pt has very poorly controlled HTN at home, previous 3 med regimen, r/o for RAS previously  AKI - unclear baseline ( 2.25 in 12/2015)   Anemia- appears chronic  Repeat labs in AM  Hypocalcemia Replacement in progress Repeat labs in AM  CHF Cardiology following  Concern for UGIB s/p TPA IV Protonix in progress GI Consulted  HYPERTENSION: Unstable, elevated B/P's noted overnight SBP goal of < 180. DBP goal of < 105.  Nicardipine drip, Labetolol PRN Long term BP goal normotensive. May slowly restart home B/P medications after 48 hours Coreg restarted and Norvasc added 04/18/2017 Home Meds: Coreg  HYPERLIPIDEMIA:    Component Value Date/Time   CHOL 185 04/18/2017 0132   TRIG 73 04/18/2017 0132   HDL 41 04/18/2017 0132   CHOLHDL 4.5 04/18/2017 0132   VLDL 15 04/18/2017 0132   LDLCALC 129 (H) 04/18/2017 0132  Home Meds:  NONE LDL  goal < 70 Started on Lipitor to 40 mg daily Continue statin at discharge  R/O DIABETES: Lab Results  Component Value Date   HGBA1C 5.2 04/18/2017  HgbA1c goal < 7.0  OBESITY Obesity, Body mass index is 40.4 kg/m. 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 Schedule an appointment as soon as possible for a visit in 6 week(s).   Specialties:  Neurology, Radiology Contact information: [address] [place] [address] [phone]          Lois Huxley, PA -PCP Follow up in 1-2 weeks        I have personally examined this patient, reviewed notes, independently viewed imaging studies, participated in medical decision making and plan of care.ROS completed by me personally and pertinent positives fully documented  I have made any additions or clarifications directly to the above note.  Plan mobilize out of bed. PT OT consults. Repeat hematocrit in a.m. and if stable consider discharge after loop recorder. This patient is critically ill and at significant risk of neurological worsening, death and care requires constant monitoring of vital signs, hemodynamics,respiratory and cardiac monitoring, extensive review of multiple databases, frequent neurological assessment, discussion with family, other specialists and medical decision making of high complexity.I have made any additions or clarifications directly to the above note.This critical care time does not reflect procedure time, or teaching time or supervisory time of PA/NP/Med Resident etc but could involve care discussion time.  I spent 30 minutes of neurocritical care time  in the care of  this patient.     Antony Contras, MD Medical Director [facility] Pager: [phone] 04/19/2017 3:01 PM  To contact Stroke Continuity provider, please refer to [email or web address] After hours, contact General Neurology

## 2017-04-19 NOTE — Progress Notes (Signed)
Text message sent to Dr Leonie Man for diet orders for patient post ERCP and TEE.

## 2017-04-19 NOTE — Care Management Note (Addendum)
Case Management Note  Patient Details  Name: Erin Dean MRN: [id number] Date of Birth: Jun 17, 1965  Subjective/Objective:      from home alone, presents with an acute onset of severe expressive aphasia, She improved with the IV TPA, but then began complaining of abdominal pain.  She started vomiting with concern for hematemesis. Await pt eval.                  Action/Plan: NCM will follow for dc needs.   Expected Discharge Date:                  Expected Discharge Plan:  Home/Self Care  In-House Referral:     Discharge planning Services  CM Consult  Post Acute Care Choice:    Choice offered to:     DME Arranged:    DME Agency:     HH Arranged:    HH Agency:     Status of Service:  In process, will continue to follow  If discussed at Long Length of Stay Meetings, dates discussed:    Additional Comments:  Zenon Mayo, RN 04/19/2017, 5:34 PM

## 2017-04-19 NOTE — Anesthesia Preprocedure Evaluation (Addendum)
Anesthesia Evaluation  Patient identified by MRN, date of birth, ID band Patient awake    Reviewed: Allergy & Precautions, NPO status , Patient's Chart, lab work & pertinent test results, reviewed documented beta blocker date and time   History of Anesthesia Complications Negative for: history of anesthetic complications  Airway Mallampati: II  TM Distance: >3 FB Neck ROM: Full    Dental  (+) Dental Advisory Given   Pulmonary neg pulmonary ROS,    breath sounds clear to auscultation       Cardiovascular hypertension, Pt. on medications and Pt. on home beta blockers (-) angina Rhythm:Regular Rate:Normal  04/18/17 ECHO: EF 60-65%, mild AI, mild MR   Neuro/Psych Anxiety Recent CVA, speech Sx resolved with TPA CVA (speech difficulties: acute infarct in Lt posterior M3 branch ), No Residual Symptoms    GI/Hepatic negative GI ROS, Neg liver ROS,   Endo/Other  Morbid obesity  Renal/GU Renal InsufficiencyRenal disease (creat 3.4)     Musculoskeletal   Abdominal (+) + obese,   Peds  Hematology  (+) Blood dyscrasia (Hb 8.1), ,   Anesthesia Other Findings   Reproductive/Obstetrics                            Anesthesia Physical Anesthesia Plan  ASA: III  Anesthesia Plan: MAC   Post-op Pain Management:    Induction: Intravenous  PONV Risk Score and Plan: 2 and Ondansetron  Airway Management Planned: Natural Airway and Nasal Cannula  Additional Equipment:   Intra-op Plan:   Post-operative Plan:   Informed Consent: I have reviewed the patients History and Physical, chart, labs and discussed the procedure including the risks, benefits and alternatives for the proposed anesthesia with the patient or authorized representative who has indicated his/her understanding and acceptance.   Dental advisory given  Plan Discussed with: CRNA and Surgeon  Anesthesia Plan Comments: (Plan routine  monitors, MAC)       Anesthesia Quick Evaluation

## 2017-04-20 ENCOUNTER — Inpatient Hospital Stay (HOSPITAL_COMMUNITY): Payer: BLUE CROSS/BLUE SHIELD

## 2017-04-20 ENCOUNTER — Encounter (HOSPITAL_COMMUNITY): Payer: Self-pay | Admitting: Nephrology

## 2017-04-20 DIAGNOSIS — D631 Anemia in chronic kidney disease: Secondary | ICD-10-CM

## 2017-04-20 DIAGNOSIS — N184 Chronic kidney disease, stage 4 (severe): Secondary | ICD-10-CM

## 2017-04-20 LAB — RENAL FUNCTION PANEL
Albumin: 2.9 g/dL — ABNORMAL LOW (ref 3.5–5.0)
Anion gap: 7 (ref 5–15)
BUN: 35 mg/dL — AB (ref 6–20)
CHLORIDE: 108 mmol/L (ref 101–111)
CO2: 21 mmol/L — AB (ref 22–32)
CREATININE: 3.6 mg/dL — AB (ref 0.44–1.00)
Calcium: 8.1 mg/dL — ABNORMAL LOW (ref 8.9–10.3)
GFR calc Af Amer: 16 mL/min — ABNORMAL LOW (ref >60)
GFR, EST NON AFRICAN AMERICAN: 14 mL/min — AB (ref >60)
Glucose, Bld: 94 mg/dL (ref 65–99)
Phosphorus: 4.7 mg/dL — ABNORMAL HIGH (ref 2.5–4.6)
Potassium: 3.8 mmol/L (ref 3.5–5.1)
SODIUM: 136 mmol/L (ref 135–145)

## 2017-04-20 LAB — RETICULOCYTES
RBC.: 3.19 MIL/uL — ABNORMAL LOW (ref 3.87–5.11)
RETIC CT PCT: 2.3 % (ref 0.4–3.1)
Retic Count, Absolute: 73.4 10*3/uL (ref 19.0–186.0)

## 2017-04-20 LAB — C-REACTIVE PROTEIN: CRP: 0.8 mg/dL (ref <1.0)

## 2017-04-20 LAB — FERRITIN: FERRITIN: 89 ng/mL (ref 11–307)

## 2017-04-20 LAB — IRON AND TIBC
Iron: 28 ug/dL (ref 28–170)
SATURATION RATIOS: 9 % — AB (ref 10.4–31.8)
TIBC: 297 ug/dL (ref 250–450)
UIBC: 269 ug/dL

## 2017-04-20 LAB — VITAMIN B12: Vitamin B-12: 240 pg/mL (ref 180–914)

## 2017-04-20 LAB — MAGNESIUM: MAGNESIUM: 1.9 mg/dL (ref 1.7–2.4)

## 2017-04-20 LAB — SEDIMENTATION RATE: SED RATE: 67 mm/h — AB (ref 0–22)

## 2017-04-20 LAB — RPR: RPR: NONREACTIVE

## 2017-04-20 LAB — HEMOGLOBIN AND HEMATOCRIT, BLOOD
HEMATOCRIT: 26.7 % — AB (ref 36.0–46.0)
HEMOGLOBIN: 8 g/dL — AB (ref 12.0–15.0)

## 2017-04-20 LAB — FOLATE: Folate: 10.3 ng/mL (ref >5.9)

## 2017-04-20 MED ORDER — FUROSEMIDE 80 MG PO TABS
80.0000 mg | ORAL_TABLET | Freq: Two times a day (BID) | ORAL | Status: DC
  Administered 2017-04-21 – 2017-04-23 (×4): 80 mg via ORAL
  Filled 2017-04-20 (×4): qty 1

## 2017-04-20 MED ORDER — HYDRALAZINE HCL 50 MG PO TABS
100.0000 mg | ORAL_TABLET | Freq: Three times a day (TID) | ORAL | Status: DC
  Administered 2017-04-21 – 2017-04-23 (×5): 100 mg via ORAL
  Filled 2017-04-20 (×5): qty 2

## 2017-04-20 MED ORDER — ORAL CARE MOUTH RINSE
15.0000 mL | Freq: Two times a day (BID) | OROMUCOSAL | Status: DC
  Administered 2017-04-20 – 2017-04-21 (×3): 15 mL via OROMUCOSAL

## 2017-04-20 MED ORDER — LABETALOL HCL 5 MG/ML IV SOLN
5.0000 mg | INTRAVENOUS | Status: DC | PRN
  Administered 2017-04-21: 5 mg via INTRAVENOUS
  Filled 2017-04-20: qty 4

## 2017-04-20 NOTE — Plan of Care (Signed)
Pt calm and cooperative with care, seems to be progressing in all areas

## 2017-04-20 NOTE — Progress Notes (Signed)
NEUROHOSPITALISTS STROKE TEAM - DAILY PROGRESS NOTE   SUBJECTIVE (INTERVAL HISTORY) No family members present.  The patient's only complaint this morning except that she is mildly short of breath without her oxygen. O2 sats currently in the high 80s.  She has never been a smoker. BP systolic 245'Y. We discussed the patient's multiple medical issues.  She states she was not aware of any of these.  I told her she would need follow-up on these issues.  The plan will be for possible discharge tomorrow.  Repeat the chest x-ray today. Address BP.  The patient lives alone.     OBJECTIVE Lab Results: CBC:  Recent Labs  Lab 04/17/17 0937  04/18/17 0132  04/19/17 0752 04/19/17 1258 04/20/17 0354  WBC 7.4  --  11.5*  --   --  8.2  --   HGB 9.3*   < > 8.5*   < > 8.5* 8.6* 8.0*  HCT 30.7*   < > 28.5*   < > 28.6* 29.1* 26.7*  MCV 84.6  --  85.8  --   --  86.6  --   PLT 257  --  267  --   --  230  --    < > = values in this interval not displayed.   BMP: Recent Labs  Lab 04/17/17 0937 04/17/17 0944 04/17/17 1648 04/19/17 0752 04/20/17 0354  NA 137 141  --  140 136  K 3.9 4.0  --  3.7 3.8  CL 107 109  --  108 108  CO2 19*  --   --  22 21*  GLUCOSE 108* 107*  --  97 94  BUN 40* 36*  --  38* 35*  CREATININE 3.22* 3.40*  --  3.65* 3.60*  CALCIUM 8.8*  --   --  8.9 8.1*  MG  --   --  1.9 2.0 1.9  PHOS  --   --   --  4.4 4.7*   Liver Function Tests:  Recent Labs  Lab 04/17/17 0937 04/20/17 0354  AST 19  --   ALT 9*  --   ALKPHOS 98  --   BILITOT 0.7  --   PROT 8.2*  --   ALBUMIN 3.4* 2.9*   Cardiac Enzymes:  Recent Labs  Lab 04/17/17 1712 04/18/17 0132  TROPONINI 0.29* 0.27*   Coagulation Studies:  Recent Labs    04/17/17 0937 04/19/17 0752  APTT 32  --   INR 1.08 1.23   PHYSICAL EXAM Temp:  [97.7 F (36.5 C)-98.7 F (37.1 C)] 98 F (36.7 C) (01/12 0300) Pulse Rate:  [51-79] 51 (01/12 0600) Resp:  [16-28] 17  (01/12 0600) BP: (109-238)/(39-101) 146/66 (01/12 0600) SpO2:  [88 %-100 %] 100 % (01/12 0600)   Vitals:   04/20/17 0400 04/20/17 0500 04/20/17 0600 04/20/17 0809  BP: (!) 168/79 (!) 147/77 (!) 146/66 (!) 170/81  Pulse: (!) 54 (!) 52 (!) 51   Resp: 20 19 17    Temp:    97.7 F (36.5 C)  TempSrc:    Oral  SpO2: 100% 100% 100%   Weight:      Height:        General - Well nourished, well developed, in no apparent distress HEENT-  Normocephalic, Normal external eye/conjunctiva.  Normal external ears. Normal external nose, mucus membranes and septum.   Cardiovascular - Regular rate and rhythm  Respiratory -decreased breath sounds throughout -especially at bases.  No wheezes or rhonchi noted. Abdomen - soft  and non-tender, BS normal Extremities- 1+ edema. No cyanosis  Neuro Exam : Mental Status: Patient is awake, alert, she is able to answer questions, with mild expressive aphasia, Patient is able to give a clear and coherent history. No signs  neglect Cranial Nerves: II: Visual Fields are full. Pupils are equal, round, and reactive to light.   III,IV, VI: EOMI without ptosis or diploplia.  V: Facial sensation is symmetric to temperature VII: Facial movement is symmetric.  VIII: hearing is intact to voice X: Uvula elevates symmetrically XI: Shoulder shrug is symmetric. XII: tongue is midline without atrophy or fasciculations.  Motor: Tone is normal. Bulk is normal. 5/5 strength was present in all four extremities.  Sensory: Sensation is symmetric to light touch and temperature in the arms and legs. Cerebellar: FNF and HKS are intact bilaterally  IMAGING: I have personally reviewed the radiological images below and agree with the radiology interpretations.  MRI/MRA Head/Brain Wo Contrast Result Date: 04/18/2017 IMPRESSION: Acute infarction in a left posterior M3 branch distribution affecting the temporoparietal junction, measuring about 5 cm in size. Swelling but no  hemorrhage or mass effect. Mild chronic small-vessel ischemic changes elsewhere within the hemispheric white matter. No large vessel anterior circulation finding. Probable missing posterior M3 branch on the left. Stenotic distal right vertebral artery. Electronically Signed   By: Nelson Chimes M.D.   On: 04/18/2017 12:32   Dg Chest Portable 1 View Result Date: 04/17/2017  IMPRESSION: Mild to moderate CHF. Electronically Signed   By: Rolm Baptise M.D.   On: 04/17/2017 11:56   Ct Head Code Stroke Wo Contrast Result Date: 04/17/2017 IMPRESSION: 1. Acute nonhemorrhagic infarction affecting the RIGHT occipital cortex and underlying white matter, as described above.Vascular distribution corresponds to the posterior-most M3 division LEFT MCA. 2. ASPECTS is 9. These results were called by telephone at the time of interpretation on 04/17/2017 at 9:51 a.m. to Dr. Leonel Ramsay , who verbally acknowledged these results. Electronically Signed   By: Staci Righter M.D.   On: 04/17/2017 10:05   Echocardiogram:                                               Study Conclusions - Left ventricle: The cavity size was normal. Wall thickness was   normal. Systolic function was normal. The estimated ejection   fraction was in the range of 60% to 65%. - Aortic valve: There was mild regurgitation. - Mitral valve: There was mild regurgitation. - Left atrium: The atrium was moderately dilated. - Right atrium: The atrium was mildly dilated. - Atrial septum: No defect or patent foramen ovale was identified.  B/L Carotid U/S:                                                 Final Interpretation: Right Carotid: There is evidence in the right ICA of a 1-39% stenosis. Left Carotid: There is evidence in the left ICA of a 1-39% stenosis. Vertebrals: Left vertebral artery was patent with antegrade flow. Right vertebral appears partially occluded.  TEE  normal Loop Recorder:  PENDING     IMPRESSION: [name] is a 52 y.o. female with PMH of HTN who presented with an acute onset of severe expressive aphasia, She improved markedly with the IV TPA, but then began complaining of abdominal pain.  She started vomiting with concern for hematemesis. Unfortunately, I would be very hesitant to reverse this even if there is some GI bleeding(Gastroccult pending) even her rapid improvement.  I suspect the elevated troponin is related to the stroke, no chest pain or EKG change, will trend.   Acute infarction in a left posterior M3 branch distribution affecting the temporoparietal junction, measuring about 5 cm in size of likely embolic etiology  Acute nonhemorrhagic infarction affecting the RIGHT occipital cortex and underlying white matter,   Suspected Etiology:   cardioembolic or cryptogenic  source, Resultant Symptoms: severe expressive aphasia, Stroke Risk Factors: hyperlipidemia and hypertension Other Stroke Risk Factors: Obesity, Body mass index is 40.4 kg/m.   Outstanding Stroke Work-up Studies:      Loop Recorder:                                            04/20/2017 ASSESSMENT:   Neuro exam stable. Mild expressive aphasia. Continues to have elevated B/P. MRI post tPA without evidence of bleeding. Appreciate CCM assistance with multiple medical problems.  Continue to monitor hematocrit. Mobilize out of bed. Therapy consults. 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    R/O AFIB TEE  - no source of emboli Loop to be performed on an outpatient basis  Abnormal EKG- Prolonged QTc and Elevated Troponins Cardiology Consultation TEE in AM  MEDICAL ISSUES: CCM no longer following - appreciate Assistance Acute hypoxic respiratory failure related to pulmonary edema  HTN crisis - suspect pt has very poorly controlled HTN at home, previous 3 med regimen, r/o for RAS previously  AKI - unclear baseline ( 2.25 in 12/2015) ( 1.00 2014) Outpatient follow-up with nephrology   Anemia- appears chronic -> check anemia panel  Hemoglobin 8.0 today.  Repeat in a.m. Repeat CBC in 1 wk. As well.   Hypocalcemia increase hydralazine to 50 mg every 8 hours Replacement in progress Repeat in AM Calcium today 8.1    CHF Cardiology saw the patient for the TEE, however, they do not appear to be following the patient.. Normal EF on TEE. Repeat chest x-ray today. Monitor O2 sats off oxygen -may need to go home on oxygen therapy.   Concern for UGIB s/p TPA IV Protonix in progress GI Consulted D/C on PO Protonix   HYPERTENSION: Unstable, elevated B/P's noted overnight SBP goal of < 180. DBP goal of < 105.  Now off nicardipine drip -  Labetolol PRN Long term BP goal normotensive. Coreg restarted and Norvasc added 04/18/2017 Home Meds: Coreg -would not increase Coreg secondary  to bradycardia Blood pressure still running high.-Will increase hydralazine to 50 mg every 8 hours.  HYPERLIPIDEMIA:    Component Value Date/Time   CHOL 185 04/18/2017 0132   TRIG 73 04/18/2017 0132   HDL 41 04/18/2017 0132   CHOLHDL 4.5 04/18/2017 0132   VLDL 15 04/18/2017 0132   LDLCALC 129 (H) 04/18/2017 0132  Home Meds:  NONE LDL  goal < 70 Started on Lipitor to 40 mg daily Continue statin at discharge  R/O DIABETES: Lab Results  Component Value  Date   HGBA1C 5.2 04/18/2017  HgbA1c goal < 7.0  OBESITY Obesity, Body mass index is 40.4 kg/m. 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 Yes; Fluid consistency: Thin   FAMILY UPDATES: No family at bedside  TEAM UPDATES: Rosalin Hawking, MD   Prior Home Stroke Medications:  No antithrombotic  Discharge Stroke Meds:     Follow Up:  Follow-up Information    Garvin Fila, MD. Schedule an appointment as soon as possible for a visit in 6 week(s).   Specialties:  Neurology, Radiology Contact information: [address] [place] [address] [phone]          Lois Huxley, [place] Follow up in 1-2 weeks        Plan as noted above.  In pt Nephrology consult. Hope to discharge patient tomorrow or Monday.  Follow-up on labs and chest x-ray tomorrow.  Monitor oxygen saturation on room air.  Patient lives alone.  We will plan on home health OT PT and an aide.   Mikey Bussing PA-C [facility] Pager [phone] 04/20/2017, 9:27 AM  ATTENDING NOTE: I reviewed above note and agree with the assessment and plan. I have made any additions or clarifications directly to the above note. Pt was seen and examined.   52 year old female with history of hypertension, CKD, obesity admitted for aphasia and difficulty speaking.  Status post TPA symptoms improved.  Developed [place] upper GI bleeding.  EGD negative.  MRI showed left inferior MCA moderate-sized infarct.  MRA largely unremarkable except right VA occlusion.  LDL 129 and A1c 5.2 EF 60-65% carotid Doppler negative.  TEE also negative without any PFO.  Patient stroke etiology unclear, hypercoagulable workup pending and Loop recorder pending at discharge.  Patient continued to have elevated  creatinine, with anemia.  Nephrology consulted, Lasix added.  Pending renal ultrasound.  BP still not been good control, increase hydralazine dose.  Labetalol as needed.  PT OT recommend home PT/OT.  Rosalin Hawking, MD PhD Stroke Neurology 04/20/2017 11:35 PM   To contact Stroke Continuity provider, please refer to [email or web address] After hours, contact General Neurology

## 2017-04-20 NOTE — Consult Note (Signed)
Renal Service Consult Note [facility]  Erin Dean 04/20/2017 [place] Requesting Physician:  Dr Erlinda Hong  Reason for Consult:  CKD HPI: The patient is a 52 y.o. year-old with hx of HTN, CKD brought to ED on 04/17/17 with new aphasia, HA and HTN BP 270/140 w blurred vision as well.  ED rx included IV labetalol and Cleviprex gtt, also got TPA after BP improved.  Was SOB, had NRB mask and IV lasix 40 mg. Went to MRI.  Symptoms improved after TPA.  CTA not done due to [place].  MRA not done because she was SOB and couldn't lie flat.  Improved markedly per neuro notes after TPA.  CT head showed old L parietal CVA.  CXR showed CHF.  Rx'd with O2, and IV lasix.  Developed abd pain and had EGD on 1/11 for hematemesis was negative. TEE was negative for source of embolic stroke.  Severe LVH and chronic pulm HTN noted. BP better controlled now and off drips.  Taking coreg and hydralazine.  BP's this afternoon are in the 158/90 range.  Creat on admit was 3.40 w BUN 36.  Creat yest was 3.6 and is 3.6 again today.  wts are stable around 117kg.  F/u cxr on 1/12 today shows resolution of CHF.  Asked to see for CKD.    Patient says she has had HTN for about "2 years".  Doesn't take her medications all the time like she is supposed to and hasn't been seeing her doctors like she is supposed to.  Denies any nsaids use, dysuria, change in urine color , hematuria, flank pain. Does have chronic issues w ankle edema and intermittent orthopnea at home.    Pt is single/divorced, her daughter knows more about her medications than she does, pt says.  No tob/ etoh.  No hx MI, no hx major surgery.  No prior CVA.    Old chart: June 2014 > HTN crisis, no BP meds x 3 yrs due to loss of insurance but recently had not missed any doses, seeing dots in visual field.  rx IV labetalol and visual symptoms resolved. CT head neg, BP better, dc;d home on 3 BP meds + HCTZ.      ROS  denies CP  no joint pain   no  HA  no blurry vision  no rash  no diarrhea  no nausea/ vomiting   Past Medical History  Past Medical History:  Diagnosis Date  . Hypertension    Past Surgical History  Past Surgical History:  Procedure Laterality Date  . CESAREAN SECTION    . ESOPHAGOGASTRODUODENOSCOPY N/A 04/19/2017   Procedure: ESOPHAGOGASTRODUODENOSCOPY (EGD);  Surgeon: Carol Ada, MD;  Location: [place];  Service: Endoscopy;  Laterality: N/A;  . TEE WITHOUT CARDIOVERSION N/A 04/19/2017   Procedure: TRANSESOPHAGEAL ECHOCARDIOGRAM (TEE);  Surgeon: Carol Ada, MD;  Location: [place];  Service: Endoscopy;  Laterality: N/A;   Family History  Family History  Problem Relation Age of Onset  . Heart attack Maternal Uncle   . Heart attack Maternal Grandfather   . Stroke Paternal Grandmother    Social History  reports that  has never smoked. she has never used smokeless tobacco. She reports that she does not drink alcohol or use drugs. Allergies No Known Allergies Home medications Prior to Admission medications   Medication Sig Start Date End Date Taking? Authorizing Provider  carvedilol (COREG) 6.25 MG tablet Take 1 tablet (6.25 mg total) by mouth 2 (two) times daily. 12/15/15  Yes Jettie Booze, MD  escitalopram (LEXAPRO) 10 MG tablet Take 10 mg by mouth daily.   Yes [provider]  ferrous sulfate 325 (65 FE) MG tablet Take 325 mg by mouth 2 (two) times daily with a meal.    Yes [provider]  ALPRAZolam (XANAX) 0.5 MG tablet Take 0.25-0.5 mg by mouth 3 (three) times daily as needed (anxiety). Pt has not taken yet    [provider]  amLODipine (NORVASC) 10 MG tablet Take 1 tablet (10 mg total) by mouth daily. 12/01/15 02/29/16  Jettie Booze, MD  hydrALAZINE (APRESOLINE) 25 MG tablet Take 1 tablet (25 mg total) by mouth 3 (three) times daily. 12/26/15 12/20/16  Jettie Booze, MD  HYDROcodone-acetaminophen (NORCO/VICODIN) 5-325 MG tablet Take 2 tablets by  mouth every 4 (four) hours as needed. 08/04/16   Fransico Meadow, PA-C  spironolactone (ALDACTONE) 25 MG tablet Take 1 tablet (25 mg total) by mouth daily. 12/16/15 12/10/16  Jettie Booze, MD  valsartan-hydrochlorothiazide (DIOVAN-HCT) 320-25 MG tablet Take 1 tablet by mouth daily. 12/23/15   Burnell Blanks, MD   Liver Function Tests Recent Labs  Lab 04/17/17 [phone] 04/20/17 0354  AST 19  --   ALT 9*  --   ALKPHOS 98  --   BILITOT 0.7  --   PROT 8.2*  --   ALBUMIN 3.4* 2.9*   No results for input(s): LIPASE, AMYLASE in the last 168 hours. CBC Recent Labs  Lab 04/17/17 0937  04/18/17 0132  04/19/17 0752 04/19/17 1258 04/20/17 0354  WBC 7.4  --  11.5*  --   --  8.2  --   NEUTROABS 5.2  --   --   --   --   --   --   HGB 9.3*   < > 8.5*   < > 8.5* 8.6* 8.0*  HCT 30.7*   < > 28.5*   < > 28.6* 29.1* 26.7*  MCV 84.6  --  85.8  --   --  86.6  --   PLT 257  --  267  --   --  230  --    < > = values in this interval not displayed.   Basic Metabolic Panel Recent Labs  Lab 04/17/17 0937 04/17/17 0944 04/19/17 0752 04/20/17 0354  NA 137 141 140 136  K 3.9 4.0 3.7 3.8  CL 107 109 108 108  CO2 19*  --  22 21*  GLUCOSE 108* 107* 97 94  BUN 40* 36* 38* 35*  CREATININE 3.22* 3.40* 3.65* 3.60*  CALCIUM 8.8*  --  8.9 8.1*  PHOS  --   --  4.4 4.7*   Iron/TIBC/Ferritin/ %Sat    Component Value Date/Time   IRON 28 04/20/2017 1129   TIBC 297 04/20/2017 1129   FERRITIN 89 04/20/2017 1129   IRONPCTSAT 9 (L) 04/20/2017 1129    Vitals:   04/20/17 0809 04/20/17 0945 04/20/17 0951 04/20/17 1100  BP: (!) 170/81 (!) 177/94 (!) 177/94 (!) 184/89  Pulse:    (!) 103  Resp:    12  Temp: 97.7 F (36.5 C)   97.9 F (36.6 C)  TempSrc: Oral   Oral  SpO2:    100%  Weight:      Height:       Exam Gen alert, pleasant, no distress No rash, cyanosis or gangrene Sclera anicteric, throat clear   No jvd or bruits Chest clear bilat RRR no MRG Abd soft  ntnd no mass or ascites  +bs GU defer MS no joint effusions or deformity Ext 1+ pretib edema / no  wounds or ulcers Neuro is alert, Ox 3 , nf   CXR 1/9 > mild-mod CHF CXR 1/12 > Stable cardiomegaly.  Near complete resolution of pulmonary edema. I/O 1/9 > 1/12 = 1.3L in and 2.3 L out  Na 136 K 3.8 CO2 21  BUN 35  Cr 3.60   Alb 2.9  Ca 8.1  Last LFT's ok   Fe tsat 9%  WBC 8k  Hb 8.0 plt 230  Home meds: -aldactone 25 qd/ diovan-HCT 320-25 qd/ coreg 6.25 bid/ norvasc 10 qd/ hydralazine 25 tid -xanax tid prn/ lexapro 10 qd/ fe so4/ prn norco  Date  Creat  eGFR  2014  1.0- 1.1 67 ml/min 2017  2.0- 2.2 32 ml/min 04/17/17  3.22  16 ml/min 04/20/17 3.60  15 ml/min   Impression: 1. CKD stage IV - no indication for HD at this time. eGFR is around 15- 20 ml/min. Severe underlying chronic HTN most likely cause. Will get renal US and UA.  Will need office f/u, we will arrange.  Will follow.  2. CHF - prob due to vol overload, improved in hospital w/ IV lasix.  Will need lasix at dc, will start po lasix now 80 bid while here and should get 80 mg po daily at time of dc please.  Won't need the HCTZ and aldactone she was supposed to be taking at home.  3. HTN - severe on admit, better, continue norvasc/ hydralazine and coreg as she is getting now, adding po lasix.      Plan - as above  Kelly Splinter MD [place] pager [phone]   04/20/2017, 4:09 PM

## 2017-04-20 NOTE — Plan of Care (Signed)
Discussed with patient plan of care for the evening and she wanted to go to sleep; report given outside the room per patient request to rest with some teach back displayed.  Report given late when patient woke up.

## 2017-04-21 ENCOUNTER — Inpatient Hospital Stay (HOSPITAL_COMMUNITY): Payer: BLUE CROSS/BLUE SHIELD

## 2017-04-21 LAB — RENAL FUNCTION PANEL
ALBUMIN: 2.7 g/dL — AB (ref 3.5–5.0)
ANION GAP: 7 (ref 5–15)
BUN: 33 mg/dL — AB (ref 6–20)
CALCIUM: 8.3 mg/dL — AB (ref 8.9–10.3)
CO2: 22 mmol/L (ref 22–32)
CREATININE: 3.54 mg/dL — AB (ref 0.44–1.00)
Chloride: 111 mmol/L (ref 101–111)
GFR calc Af Amer: 16 mL/min — ABNORMAL LOW (ref >60)
GFR calc non Af Amer: 14 mL/min — ABNORMAL LOW (ref >60)
Glucose, Bld: 98 mg/dL (ref 65–99)
PHOSPHORUS: 4.5 mg/dL (ref 2.5–4.6)
Potassium: 4.2 mmol/L (ref 3.5–5.1)
SODIUM: 140 mmol/L (ref 135–145)

## 2017-04-21 LAB — URINALYSIS, ROUTINE W REFLEX MICROSCOPIC
BILIRUBIN URINE: NEGATIVE
Glucose, UA: 50 mg/dL — AB
Hgb urine dipstick: NEGATIVE
KETONES UR: NEGATIVE mg/dL
Leukocytes, UA: NEGATIVE
Nitrite: NEGATIVE
PROTEIN: 100 mg/dL — AB
SPECIFIC GRAVITY, URINE: 1.014 (ref 1.005–1.030)
pH: 5 (ref 5.0–8.0)

## 2017-04-21 MED ORDER — FERROUS SULFATE 325 (65 FE) MG PO TABS
325.0000 mg | ORAL_TABLET | Freq: Three times a day (TID) | ORAL | Status: DC
  Administered 2017-04-22 – 2017-04-23 (×4): 325 mg via ORAL
  Filled 2017-04-21 (×4): qty 1

## 2017-04-21 MED ORDER — LABETALOL HCL 5 MG/ML IV SOLN
5.0000 mg | INTRAVENOUS | Status: DC | PRN
  Administered 2017-04-21: 5 mg via INTRAVENOUS
  Administered 2017-04-22: 20 mg via INTRAVENOUS
  Administered 2017-04-22: 10 mg via INTRAVENOUS
  Filled 2017-04-21 (×3): qty 4

## 2017-04-21 NOTE — Progress Notes (Signed)
Attempted to call report x 2.  RN in another room and will return call momentarily.  Pt for [place] and will go down to [place] when transport arrives.

## 2017-04-21 NOTE — Progress Notes (Signed)
Report given to Melanee Left, RN who will assume care of pt upon arrival to [id number].  Pt for ultrasound and after that will go to 3W.

## 2017-04-21 NOTE — Progress Notes (Signed)
Patient was resting comfortably when transfer orders came through.  Patient preferred to go in the morning which was okay as long as we had beds available.  Patient was informed she would be transferred if we needed a bed for a Stepdown patient charge nurse informed.

## 2017-04-21 NOTE — Progress Notes (Signed)
NEUROHOSPITALISTS STROKE TEAM - DAILY PROGRESS NOTE   SUBJECTIVE (INTERVAL HISTORY) No family members present. The patient reports several tender subcutaneous nodules upper Rt arm above her IV site. IV site itself looks fine without erythema or signs of infection. No enlarged lymph nodes in cervical area. Discussed with the patient's nurse. She will ask the IV team to evaluate. The patient may need warm compresses. Nephrology recs appreciated. They have signed off and will follow up as outpt.      OBJECTIVE Lab Results: CBC:  Recent Labs  Lab 04/17/17 0937  04/18/17 0132  04/19/17 0752 04/19/17 1258 04/20/17 0354  WBC 7.4  --  11.5*  --   --  8.2  --   HGB 9.3*   < > 8.5*   < > 8.5* 8.6* 8.0*  HCT 30.7*   < > 28.5*   < > 28.6* 29.1* 26.7*  MCV 84.6  --  85.8  --   --  86.6  --   PLT 257  --  267  --   --  230  --    < > = values in this interval not displayed.   BMP: Recent Labs  Lab 04/17/17 0937 04/17/17 0944 04/17/17 1648 04/19/17 0752 04/20/17 0354 04/21/17 0621  NA 137 141  --  140 136 140  K 3.9 4.0  --  3.7 3.8 4.2  CL 107 109  --  108 108 111  CO2 19*  --   --  22 21* 22  GLUCOSE 108* 107*  --  97 94 98  BUN 40* 36*  --  38* 35* 33*  CREATININE 3.22* 3.40*  --  3.65* 3.60* 3.54*  CALCIUM 8.8*  --   --  8.9 8.1* 8.3*  MG  --   --  1.9 2.0 1.9  --   PHOS  --   --   --  4.4 4.7* 4.5   Liver Function Tests:  Recent Labs  Lab 04/17/17 0937 04/20/17 0354 04/21/17 0621  AST 19  --   --   ALT 9*  --   --   ALKPHOS 98  --   --   BILITOT 0.7  --   --   PROT 8.2*  --   --   ALBUMIN 3.4* 2.9* 2.7*   Cardiac Enzymes:  Recent Labs  Lab 04/17/17 1712 04/18/17 0132  TROPONINI 0.29* 0.27*   Coagulation Studies:  Recent Labs    04/19/17 0752  INR 1.23   PHYSICAL EXAM Temp:  [97.5 F (36.4 C)-98.1 F (36.7 C)] 97.5 F (36.4 C) (01/13 0302) Pulse Rate:  [54-103] 61 (01/13 0800) Resp:  [12-28] 25  (01/13 0800) BP: (147-184)/(58-92) 180/89 (01/13 0800) SpO2:  [85 %-100 %] 99 % (01/13 0800)   Vitals:   04/21/17 0600 04/21/17 0609 04/21/17 0700 04/21/17 0800  BP:  (!) 159/85  (!) 180/89  Pulse: (!) 56 (!) 56 (!) 55 61  Resp: '19 19 19 ' (!) 25  Temp:      TempSrc:      SpO2: 99% 99% 100% 99%  Weight:      Height:        General - Well nourished, well developed, in no apparent distress HEENT-  Normocephalic, Normal external eye/conjunctiva.  Normal external ears. Normal external nose, mucus membranes and septum.   Rt Upper Extremity - several subcutaneous nodules just above the right elbow which are tender to the touch. No erythema noted. No drainage. Cardiovascular - Regular  rate and rhythm  Respiratory -decreased breath sounds throughout -especially at bases.  No wheezes or rhonchi noted. Abdomen - soft and non-tender, BS normal Extremities- 1+ edema. No cyanosis  Neuro Exam : Mental Status: Patient is awake, alert, she is able to answer questions, with mild expressive aphasia, Patient is able to give a clear and coherent history. No signs  neglect Cranial Nerves: II: Visual Fields are full. Pupils are equal, round, and reactive to light.   III,IV, VI: EOMI without ptosis or diploplia.  V: Facial sensation is symmetric to temperature VII: Facial movement is symmetric.  VIII: hearing is intact to voice X: Uvula elevates symmetrically XI: Shoulder shrug is symmetric. XII: tongue is midline without atrophy or fasciculations.  Motor: Tone is normal. Bulk is normal. 5/5 strength was present in all four extremities.  Sensory: Sensation is symmetric to light touch and temperature in the arms and legs. Cerebellar: FNF and HKS are intact bilaterally  IMAGING: I have personally reviewed the radiological images below and agree with the radiology interpretations.  Renal US 04/21/2017 IMPRESSION: Increased echotexture in the kidneys bilaterally compatible with chronic medical  renal disease  MRI/MRA Head/Brain Wo Contrast Result Date: 04/18/2017 IMPRESSION: Acute infarction in a left posterior M3 branch distribution affecting the temporoparietal junction, measuring about 5 cm in size. Swelling but no hemorrhage or mass effect. Mild chronic small-vessel ischemic changes elsewhere within the hemispheric white matter. No large vessel anterior circulation finding. Probable missing posterior M3 branch on the left. Stenotic distal right vertebral artery. Electronically Signed   By: Nelson Chimes M.D.   On: 04/18/2017 12:32   Dg Chest Portable 1 View Result Date: 04/17/2017  IMPRESSION: Mild to moderate CHF. Electronically Signed   By: Rolm Baptise M.D.   On: 04/17/2017 11:56   Ct Head Code Stroke Wo Contrast Result Date: 04/17/2017 IMPRESSION: 1. Acute nonhemorrhagic infarction affecting the RIGHT occipital cortex and underlying white matter, as described above.Vascular distribution corresponds to the posterior-most M3 division LEFT MCA. 2. ASPECTS is 9. These results were called by telephone at the time of interpretation on 04/17/2017 at 9:51 a.m. to Dr. Leonel Ramsay , who verbally acknowledged these results. Electronically Signed   By: Staci Righter M.D.   On: 04/17/2017 10:05   Echocardiogram:                                               Study Conclusions - Left ventricle: The cavity size was normal. Wall thickness was   normal. Systolic function was normal. The estimated ejection   fraction was in the range of 60% to 65%. - Aortic valve: There was mild regurgitation. - Mitral valve: There was mild regurgitation. - Left atrium: The atrium was moderately dilated. - Right atrium: The atrium was mildly dilated. - Atrial septum: No defect or patent foramen ovale was identified.  B/L Carotid U/S:                                                 Final Interpretation: Right Carotid: There is evidence in the right ICA of a 1-39% stenosis. Left Carotid: There is evidence in the  left ICA of a 1-39% stenosis. Vertebrals: Left vertebral artery was patent with antegrade flow.  Right vertebral appears partially occluded.  TEE  normal Loop Recorder:                                        PENDING    IMPRESSION: Ms. TESHIA MAHONE is a 52 y.o. female with PMH of HTN who presented with an acute onset of severe expressive aphasia, She improved markedly with the IV TPA, but then began complaining of abdominal pain.  She started vomiting with concern for hematemesis. Unfortunately, I would be very hesitant to reverse this even if there is some GI bleeding(Gastroccult pending) even her rapid improvement.  I suspect the elevated troponin is related to the stroke, no chest pain or EKG change, will trend.   Acute infarction in a left posterior M3 branch distribution affecting the temporoparietal junction, measuring about 5 cm in size of likely embolic etiology  Acute nonhemorrhagic infarction affecting the RIGHT occipital cortex and underlying white matter,   Suspected Etiology:   cardioembolic or cryptogenic  source, Resultant Symptoms: severe expressive aphasia, Stroke Risk Factors: hyperlipidemia and hypertension Other Stroke Risk Factors: Obesity, Body mass index is 40.4 kg/m.   Outstanding Stroke Work-up Studies:      Loop Recorder:  Outpatient -> ordered                                  04/21/2017 ASSESSMENT:   Neuro exam stable. Mild expressive aphasia. Continues to have elevated B/P. MRI post tPA without evidence of bleeding. Appreciate CCM assistance with multiple medical problems.  Continue to monitor hematocrit. Mobilize out of bed. Therapy consults. May discharge home in morning if stable  PLAN  04/21/2017: Continue Statin Now on ASA 325 mg daily Frequent neuro checks Telemetry monitoring PT/OT/SLP Consult PM & Rehab Consult Case Management /MSW Ongoing aggressive stroke risk factor management Patient counseled to be compliant with her antithrombotic  medications Patient counseled on Lifestyle modifications including, Diet, Exercise, and Stress Follow up with [facility] in 6 weeks  HX OF STROKES: Thought to be embolic.  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   Loop Recorder:  Outpatient -> ordered        Hypercoagulable workup pending  R/O AFIB  TEE  - no source of emboli  Loop to be performed on an outpatient basis - Order placed  MEDICAL ISSUES:  Abnormal EKG- Prolonged QTc and Elevated Troponins  Cardiology Consultation  TEE - no cardiac source of emboli  AKI   Unclear baseline ( 2.25 in 12/2015) ( 1.00 2014)  Appreciate nephrology consult Dr Jonnie Finner  Outpatient follow-up with nephrology  Renal ultrasound - consistent with chronic medical renal disease.  Hypocalcemia   Supplemented  Calcium now 8.3   Acute Hypoxic Respiratory Failure Related to Pulmonary Edema  Resolved - CCM signed off  CHF  Cardiology saw the patient for the TEE, however, they do not appear to be following the patient.  Normal EF on TEE.  Repeat chest x-ray 04/20/2016 - Stable cardiomegaly.  Near complete resolution of pulmonary edema.  Lasix per nephrology.  Concern for UGIB s/p TPA  IV Protonix in progress  GI Consulted  D/C on PO Protonix   Anemia Appears chronic -> check anemia panel  Repeat CBC in 1  wk.  Iron Sat low at 9.0 - TIBC 297 nl.  - Iron 28 low normal -> add supplement Folate and B12 normal   Tender Nodules Right Upper Extremity  Possibly related to IV - IV team to assess   HTN Crisis   Suspect pt has very poorly controlled HTN at home, previous 3 med regimen  Previous R/O for RAS  Unstable - Long term BP goal normotensive  Now off nicardipine drip -  Labetolol PRN  Coreg restarted and Norvasc added 04/18/2017  Home Meds: Coreg - would not increase Coreg secondary to bradycardia  Blood pressure  still running high - Hydralazine increased to 100 mg Tid.   HYPERLIPIDEMIA:    Component Value Date/Time   CHOL 185 04/18/2017 0132   TRIG 73 04/18/2017 0132   HDL 41 04/18/2017 0132   CHOLHDL 4.5 04/18/2017 0132   VLDL 15 04/18/2017 0132   LDLCALC 129 (H) 04/18/2017 0132   Home Meds:  NONE  LDL  goal < 70  Started on Lipitor to 40 mg daily  Continue statin at discharge    R/O DIABETES: Lab Results  Component Value Date   HGBA1C 5.2 04/18/2017   HgbA1c goal < 7.0   OBESITY  Obesity, Body mass index is 40.4 kg/m. 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 Yes; Fluid consistency: Thin   FAMILY UPDATES: No family at bedside  TEAM UPDATES: Rosalin Hawking, MD   Prior Home Stroke Medications:  No antithrombotic  Discharge Stroke Meds: ASA 325 mg daily    Follow Up:  Follow-up Information    Garvin Fila, MD. Schedule an appointment as soon as possible for a visit in 6 week(s).   Specialties:  Neurology, Radiology Contact information: [address] [place] [address] [phone]          Lois Huxley, [place] Follow up in 1-2 weeks        Plan as noted above.  In pt Nephrology consult appreciated. Hope to discharge patient Monday. Patient lives alone.  We will plan on home health OT PT and an aide.   Mikey Bussing PA-C [facility] Pager [phone] 04/21/2017, 10:53 AM  ATTENDING NOTE: I reviewed above note and agree with the assessment and plan. I have made any additions or clarifications directly to the above note. Pt was seen and examined.   52 year old female with history of hypertension, CKD, obesity admitted for aphasia and difficulty speaking.  Status post TPA symptoms improved.  Developed  [place] upper GI bleeding.  EGD negative.  MRI showed left inferior MCA moderate-sized infarct.  MRA largely unremarkable except right VA occlusion.  LDL 129 and A1c 5.2, although ESR 67 but CRP normal. EF 60-65% carotid Doppler negative.  TEE also negative without any PFO.  Patient stroke etiology unclear, hypercoagulable workup pending and Loop recorder pending at discharge.  Patient has elevated creatinine, with anemia, but stable.  Nephrology consulted, Lasix added. Currently on bid dosing, will need daily dosing on discharge. BP still fluctuate, on labetalol PRN for SBP > 180. Currently on norvasc, coreg, hydralazine and lasix.  PT OT recommend home PT/OT.  Rosalin Hawking, MD PhD Stroke Neurology 04/21/2017 10:53 AM   To contact Stroke Continuity provider, please refer  to [email or web address] After hours, contact General Neurology

## 2017-04-21 NOTE — Progress Notes (Signed)
[facility] Progress Note  Subjective: no new issues  Vitals:   04/21/17 0609 04/21/17 0700 04/21/17 0800 04/21/17 1030  BP: (!) 159/85  (!) 180/89 (!) 149/96  Pulse: (!) 56 (!) 55 61 64  Resp: 19 19 (!) 25 19  Temp:    97.9 F (36.6 C)  TempSrc:    Oral  SpO2: 99% 100% 99% 96%  Weight:      Height:        Inpatient medications: .  stroke: mapping our early stages of recovery book   Does not apply Once  . amLODipine  10 mg Oral Daily  . aspirin EC  325 mg Oral Daily  . atorvastatin  40 mg Oral q1800  . carvedilol  6.25 mg Oral BID  . ferrous sulfate  325 mg Oral TID WC  . furosemide  80 mg Oral BID  . hydrALAZINE  100 mg Oral TID  . hydrocortisone cream   Topical BID  . Influenza vac split quadrivalent PF  0.5 mL Intramuscular Tomorrow-1000  . mouth rinse  15 mL Mouth Rinse BID  . pantoprazole  40 mg Oral Daily    acetaminophen **OR** [DISCONTINUED] acetaminophen (TYLENOL) oral liquid 160 mg/5 mL **OR** [DISCONTINUED] acetaminophen, ALPRAZolam, labetalol  Exam: Gen alert, pleasant No jvd or bruits Chest clear bilat RRR no MRG Abd soft ntnd no mass or ascites +bs Ext 1+ pretib edema  Neuro is alert, Ox 3 , nf   CXR 1/9 > mild-mod CHF CXR 1/12 > Stable cardiomegaly, resolution of pulmonary edema.  Home meds: -aldact 25 qd/ diovan-HCT 320-25 qd/ coreg 6.25 bid/ norvasc 10 qd/ hydral 25 tid  -xanax tid prn/ lexapro 10 qd/ fe so4/ prn norco  Date                Creat               eGFR  2014                1.0- 1.1            67 ml/min 2017                2.0- 2.2            32 ml/min 04/17/17              3.22                 16 ml/min 04/20/17            3.60                 15 ml/min   Impression: 1. CKD stage IV - no indication for HD at this time. eGFR is around 15- 20 ml/min. Severe underlying untreated/ undertreated chronic HTN  w poor compliance is the most likely cause. Renal US results show expected echogenic kidneys, no hydro. Will  arrange for office f/u for this patient, we will contact her.  Continue lasix 80 po bid until dc then 80 mg po qam at dc.  No other suggestions at this time, will sign off. Have d/w pt and answered questions.   2. CHF - resolved, added po lasix as above, will not need any aldactone/ thiazides at dc. 3. HTN - severe on admit, much better, continue norvasc/ hydralazine and coreg as currently getting.  Agree w holding ARB for now.  F/U in renal clinic as above.   Plan - as above  Kelly Splinter MD [facility] pager [phone]   04/21/2017, 1:43 PM   Recent Labs  Lab 04/19/17 0752 04/20/17 0354 04/21/17 0621  NA 140 136 140  K 3.7 3.8 4.2  CL 108 108 111  CO2 22 21* 22  GLUCOSE 97 94 98  BUN 38* 35* 33*  CREATININE 3.65* 3.60* 3.54*  CALCIUM 8.9 8.1* 8.3*  PHOS 4.4 4.7* 4.5   Recent Labs  Lab 04/17/17 0937 04/20/17 0354 04/21/17 0621  AST 19  --   --   ALT 9*  --   --   ALKPHOS 98  --   --   BILITOT 0.7  --   --   PROT 8.2*  --   --   ALBUMIN 3.4* 2.9* 2.7*   Recent Labs  Lab 04/17/17 0937  04/18/17 0132  04/19/17 0752 04/19/17 1258 04/20/17 0354  WBC 7.4  --  11.5*  --   --  8.2  --   NEUTROABS 5.2  --   --   --   --   --   --   HGB 9.3*   < > 8.5*   < > 8.5* 8.6* 8.0*  HCT 30.7*   < > 28.5*   < > 28.6* 29.1* 26.7*  MCV 84.6  --  85.8  --   --  86.6  --   PLT 257  --  267  --   --  230  --    < > = values in this interval not displayed.   Iron/TIBC/Ferritin/ %Sat    Component Value Date/Time   IRON 28 04/20/2017 1129   TIBC 297 04/20/2017 1129   FERRITIN 89 04/20/2017 1129   IRONPCTSAT 9 (L) 04/20/2017 1129

## 2017-04-21 NOTE — Progress Notes (Signed)
Pt being taken to ultrasound by transporter on monitor, then will be going to 3W06.  Heather, RN aware that pt is going down and will contact us when she returns so we can get our monitor back to this dept.

## 2017-04-22 ENCOUNTER — Encounter (HOSPITAL_COMMUNITY): Payer: Self-pay | Admitting: Nurse Practitioner

## 2017-04-22 ENCOUNTER — Encounter (HOSPITAL_COMMUNITY)
Admission: EM | Disposition: A | Discharge status: Home / Self Care | Payer: Self-pay | Source: Home / Self Care | Attending: Neurology

## 2017-04-22 DIAGNOSIS — I10 Essential (primary) hypertension: Secondary | ICD-10-CM

## 2017-04-22 DIAGNOSIS — E785 Hyperlipidemia, unspecified: Secondary | ICD-10-CM

## 2017-04-22 HISTORY — PX: LOOP RECORDER INSERTION: EP1214

## 2017-04-22 LAB — CBC
HCT: 30.1 % — ABNORMAL LOW (ref 36.0–46.0)
HEMOGLOBIN: 9.2 g/dL — AB (ref 12.0–15.0)
MCH: 26.4 pg (ref 26.0–34.0)
MCHC: 30.6 g/dL (ref 30.0–36.0)
MCV: 86.5 fL (ref 78.0–100.0)
PLATELETS: 226 10*3/uL (ref 150–400)
RBC: 3.48 MIL/uL — AB (ref 3.87–5.11)
RDW: 16.7 % — ABNORMAL HIGH (ref 11.5–15.5)
WBC: 8.8 10*3/uL (ref 4.0–10.5)

## 2017-04-22 LAB — RENAL FUNCTION PANEL
ANION GAP: 12 (ref 5–15)
Albumin: 3.1 g/dL — ABNORMAL LOW (ref 3.5–5.0)
BUN: 31 mg/dL — ABNORMAL HIGH (ref 6–20)
CHLORIDE: 107 mmol/L (ref 101–111)
CO2: 19 mmol/L — ABNORMAL LOW (ref 22–32)
CREATININE: 3.41 mg/dL — AB (ref 0.44–1.00)
Calcium: 8.9 mg/dL (ref 8.9–10.3)
GFR calc non Af Amer: 14 mL/min — ABNORMAL LOW (ref >60)
GFR, EST AFRICAN AMERICAN: 17 mL/min — AB (ref >60)
Glucose, Bld: 87 mg/dL (ref 65–99)
Phosphorus: 3.8 mg/dL (ref 2.5–4.6)
Potassium: 3.6 mmol/L (ref 3.5–5.1)
Sodium: 138 mmol/L (ref 135–145)

## 2017-04-22 LAB — LUPUS ANTICOAGULANT PANEL
DRVVT: 46 s (ref 0.0–47.0)
PTT LA: 42.7 s (ref 0.0–51.9)

## 2017-04-22 LAB — ANTINUCLEAR ANTIBODIES, IFA: ANA Ab, IFA: NEGATIVE

## 2017-04-22 LAB — HOMOCYSTEINE: Homocysteine: 26.5 umol/L — ABNORMAL HIGH (ref 0.0–15.0)

## 2017-04-22 LAB — SICKLE CELL SCREEN: Sickle Cell Screen: NEGATIVE

## 2017-04-22 SURGERY — LOOP RECORDER INSERTION

## 2017-04-22 MED ORDER — CARVEDILOL 6.25 MG PO TABS: 6.2500 mg | ORAL_TABLET | Freq: Two times a day (BID) | ORAL | Status: DC

## 2017-04-22 MED ORDER — CLONIDINE HCL 0.1 MG PO TABS
0.1000 mg | ORAL_TABLET | Freq: Three times a day (TID) | ORAL | Status: DC
  Administered 2017-04-22 – 2017-04-23 (×3): 0.1 mg via ORAL
  Filled 2017-04-22 (×3): qty 1

## 2017-04-22 MED ORDER — CARVEDILOL 12.5 MG PO TABS
12.5000 mg | ORAL_TABLET | Freq: Two times a day (BID) | ORAL | Status: DC
  Administered 2017-04-22 – 2017-04-23 (×2): 12.5 mg via ORAL
  Filled 2017-04-22 (×2): qty 1

## 2017-04-22 MED ORDER — LIDOCAINE-EPINEPHRINE 1 %-1:100000 IJ SOLN
INTRAMUSCULAR | Status: DC | PRN
  Administered 2017-04-22: 30 mL

## 2017-04-22 MED ORDER — CARVEDILOL 12.5 MG PO TABS: 25.0000 mg | ORAL_TABLET | Freq: Two times a day (BID) | ORAL | Status: DC

## 2017-04-22 MED ORDER — VITAMIN B-12 1000 MCG PO TABS
1000.0000 ug | ORAL_TABLET | Freq: Every day | ORAL | Status: DC
  Administered 2017-04-22 – 2017-04-23 (×2): 1000 ug via ORAL
  Filled 2017-04-22 (×2): qty 1

## 2017-04-22 MED ORDER — LIDOCAINE-EPINEPHRINE 1 %-1:100000 IJ SOLN
INTRAMUSCULAR | Status: AC
  Filled 2017-04-22: qty 1

## 2017-04-22 SURGICAL SUPPLY — 2 items
LOOP REVEAL LINQSYS (Prosthesis & Implant Heart) ×2 IMPLANT
PACK LOOP INSERTION (CUSTOM PROCEDURE TRAY) ×2 IMPLANT

## 2017-04-22 NOTE — Care Management Note (Signed)
Case Management Note  Patient Details  Name: LANAH STEINES MRN: [id number] Date of Birth: [birth year]  Subjective/Objective:                    Action/Plan: Pt with BP issues today. Plan is for home when medically stable. CM following.   Expected Discharge Date:                  Expected Discharge Plan:  Home/Self Care  In-House Referral:     Discharge planning Services  CM Consult  Post Acute Care Choice:    Choice offered to:     DME Arranged:    DME Agency:     HH Arranged:    HH Agency:     Status of Service:  In process, will continue to follow  If discussed at Long Length of Stay Meetings, dates discussed:    Additional Comments:  Pollie Friar, RN 04/22/2017, 2:30 PM

## 2017-04-22 NOTE — Progress Notes (Signed)
Occupational Therapy Treatment Patient Details Name: Erin Dean MRN: [id number] DOB: [birth year] Today's Date: 04/22/2017    History of present illness 52 year old left handed female who presented with aphasia, blurred vision, and headache ischemic infarct LEFT posterior parietal and superior occipital lobes. PMHx: HTN, heart murmur   OT comments  Pt presented supine in bed, willing to participate in OT tx session. Pt completes room and hallway level functional mobility at supervision level; continues to demonstrate decreased STM and decreased awareness of deficits for higher level functional task completion. Presented Pt with various 2-3 step instructions during session with Pt requiring min-mod verbal/questioning cues to accurately recall and complete. Pt will benefit from continued acute and post-acute OT services to maximize her safety and independence with ADL/iADL completion.    Follow Up Recommendations  Home health OT;Supervision/Assistance - 24 hour;Other (comment)(24 hr initially; outpt neuro OT if unable to receive HHOT)    Equipment Recommendations  None recommended by OT          Precautions / Restrictions Precautions Precaution Comments: difficulty with word finding, problem solving and memory Restrictions Weight Bearing Restrictions: No       Mobility Bed Mobility Overal bed mobility: Modified Independent                Transfers Overall transfer level: Modified independent                                                               ADL either performed or assessed with clinical judgement   ADL Overall ADL's : Needs assistance/impaired                                     Functional mobility during ADLs: Supervision/safety General ADL Comments: Presented Pt with cognitive challenges during session, completing room/hallway level mobility at supervision level; provided Pt with multi-step directions when  completing mobility, Pt requires min verbal cues for recall of directions; completed similar task while sitting EOB with use of menu, Pt with increased difficulty recalling multi-step directions, requiring mod verbal/questioning cues      Vision Baseline Vision/History: Wears glasses Wears Glasses: At all times Patient Visual Report: Blurring of vision Additional Comments: Pt reports improvement in visual deficits, reporting feeling back at baseline; Pt able to read menu with minimal difficulty this session               Cognition Arousal/Alertness: Awake/alert Behavior During Therapy: [facility] for tasks assessed/performed Overall Cognitive Status: Impaired/Different from baseline Area of Impairment: Memory;Safety/judgement;Problem solving                     Memory: Decreased short-term memory   Safety/Judgement: Decreased awareness of deficits   Problem Solving: Slow processing;Difficulty sequencing;Requires verbal cues General Comments: Pt recalls day of week with increased time; increased difficulty recalling multi-step directions, requires cues                     General Comments Pt BP monitored during session, 152/67 start of session; 179/75 sitting EOB; 192/92 end of session after room/hallway mobility (RN informed of BP readings)     Pertinent Vitals/ Pain  Pain Assessment: No/denies pain                                                          Frequency  Min 2X/week        Progress Toward Goals  OT Goals(current goals can now be found in the care plan section)  Progress towards OT goals: Progressing toward goals  Acute Rehab OT Goals Patient Stated Goal: to return to being normal OT Goal Formulation: With patient Time For Goal Achievement: 05/02/17 Potential to Achieve Goals: Good  Plan Discharge plan remains appropriate                    AM-PAC PT "6 Clicks" Daily Activity     Outcome Measure   Help  from another person eating meals?: None Help from another person taking care of personal grooming?: None Help from another person toileting, which includes using toliet, bedpan, or urinal?: None Help from another person bathing (including washing, rinsing, drying)?: None Help from another person to put on and taking off regular upper body clothing?: None Help from another person to put on and taking off regular lower body clothing?: None 6 Click Score: 24    End of Session Equipment Utilized During Treatment: Gait belt  OT Visit Diagnosis: Other symptoms and signs involving cognitive function   Activity Tolerance Patient tolerated treatment well   Patient Left in bed;with call bell/phone within reach   Nurse Communication Mobility status;Other (comment)(BP readings )        Time: 7673-4193 OT Time Calculation (min): 22 min  Charges: OT General Charges $OT Visit: 1 Visit OT Treatments $Therapeutic Activity: 8-22 mins  [name], [place] Pager [phone] 04/22/2017    [name] 04/22/2017, 4:15 PM

## 2017-04-22 NOTE — Consult Note (Signed)
ELECTROPHYSIOLOGY CONSULT NOTE  Patient ID: Erin Dean MRN: [id number], DOB/AGE: 1965/11/17   Admit date: 04/17/2017 Date of Consult: 04/22/2017  Primary Physician: Lois Huxley, PA Primary Cardiologist: Irish Lack Reason for Consultation: Cryptogenic stroke; recommendations regarding Implantable Loop Recorder  History of Present Illness EP has been asked to evaluate Erin Dean for placement of an implantable loop recorder to monitor for atrial fibrillation by Dr Erlinda Hong.  The patient was admitted on 04/17/2017 with speech difficulties.  Imaging demonstrated acute infarct in left posterior M3 branch distribution felt to be embolic 2/2 unknown source.  She has undergone workup for stroke including echocardiogram and carotid dopplers.  The patient has been monitored on telemetry which has demonstrated sinus rhythm with short run of mid-RP tachycardia, but no AF.  TEE demonstrated no source of embolism.   Echocardiogram this admission demonstrated EF 60-65%, LA 56.  Lab work is reviewed.  Prior to admission, the patient denies chest pain, shortness of breath, dizziness, palpitations, or syncope.  They are recovering from their stroke with plans to return home at discharge.   Past Medical History:  Diagnosis Date  . Hypertension   . Stroke ([place])   . SVT (supraventricular tachycardia) [facility])      Surgical History:  Past Surgical History:  Procedure Laterality Date  . CESAREAN SECTION    . ESOPHAGOGASTRODUODENOSCOPY N/A 04/19/2017   Procedure: ESOPHAGOGASTRODUODENOSCOPY (EGD);  Surgeon: Carol Ada, MD;  Location: [place];  Service: Endoscopy;  Laterality: N/A;  . TEE WITHOUT CARDIOVERSION N/A 04/19/2017   Procedure: TRANSESOPHAGEAL ECHOCARDIOGRAM (TEE);  Surgeon: Carol Ada, MD;  Location: [place];  Service: Endoscopy;  Laterality: N/A;     Medications Prior to Admission  Medication Sig Dispense Refill Last Dose  . carvedilol (COREG) 6.25 MG tablet Take 1 tablet  (6.25 mg total) by mouth 2 (two) times daily. 60 tablet 11 unk at 8p  . escitalopram (LEXAPRO) 10 MG tablet Take 10 mg by mouth daily.   unk at Unknown time  . ferrous sulfate 325 (65 FE) MG tablet Take 325 mg by mouth 2 (two) times daily with a meal.    unk at Unknown time  . ALPRAZolam (XANAX) 0.5 MG tablet Take 0.25-0.5 mg by mouth 3 (three) times daily as needed (anxiety). Pt has not taken yet   unk  . amLODipine (NORVASC) 10 MG tablet Take 1 tablet (10 mg total) by mouth daily. 180 tablet 3 Taking  . hydrALAZINE (APRESOLINE) 25 MG tablet Take 1 tablet (25 mg total) by mouth 3 (three) times daily. 90 tablet 11   . HYDROcodone-acetaminophen (NORCO/VICODIN) 5-325 MG tablet Take 2 tablets by mouth every 4 (four) hours as needed. 16 tablet 0 unk  . spironolactone (ALDACTONE) 25 MG tablet Take 1 tablet (25 mg total) by mouth daily. 30 tablet 11   . valsartan-hydrochlorothiazide (DIOVAN-HCT) 320-25 MG tablet Take 1 tablet by mouth daily. 90 tablet 6 unk    Inpatient Medications:  .  stroke: mapping our early stages of recovery book   Does not apply Once  . amLODipine  10 mg Oral Daily  . aspirin EC  325 mg Oral Daily  . atorvastatin  40 mg Oral q1800  . carvedilol  6.25 mg Oral BID  . ferrous sulfate  325 mg Oral TID WC  . furosemide  80 mg Oral BID  . hydrALAZINE  100 mg Oral TID  . hydrocortisone cream   Topical BID  . Influenza vac split quadrivalent PF  0.5 mL  Intramuscular Tomorrow-1000  . mouth rinse  15 mL Mouth Rinse BID  . pantoprazole  40 mg Oral Daily    Allergies: No Known Allergies  Social History   Socioeconomic History  . Marital status: Legally Separated    Spouse name: Not on file  . Number of children: Not on file  . Years of education: Not on file  . Highest education level: Not on file  Social Needs  . Financial resource strain: Not on file  . Food insecurity - worry: Not on file  . Food insecurity - inability: Not on file  . Transportation needs - medical:  Not on file  . Transportation needs - non-medical: Not on file  Occupational History  . Not on file  Tobacco Use  . Smoking status: Never Smoker  . Smokeless tobacco: Never Used  Substance and Sexual Activity  . Alcohol use: No  . Drug use: No  . Sexual activity: Not on file  Other Topics Concern  . Not on file  Social History Narrative  . Not on file     Family History  Problem Relation Age of Onset  . Heart attack Maternal Uncle   . Heart attack Maternal Grandfather   . Stroke Paternal Grandmother       Review of Systems: All other systems reviewed and are otherwise negative except as noted above.  Physical Exam: Vitals:   04/21/17 2338 04/22/17 0106 04/22/17 0413 04/22/17 0516  BP: (!) 176/73 (!) 174/73 (!) 185/77 (!) 168/79  Pulse: 63 73 68 (!) 59  Resp:  18  16  Temp:  98.7 F (37.1 C)    TempSrc:  Oral    SpO2:  98%  98%  Weight:      Height:        GEN- The patient is well appearing, alert and oriented x 3 today.   Head- normocephalic, atraumatic Eyes-  Sclera clear, conjunctiva pink Ears- hearing intact Oropharynx- clear Neck- supple Lungs- Clear to ausculation bilaterally, normal work of breathing Heart- Regular rate and rhythm  GI- soft, NT, ND, + BS Extremities- no clubbing, cyanosis, or edema MS- no significant deformity or atrophy Skin- no rash or lesion Psych- euthymic mood, full affect   Labs:   Lab Results  Component Value Date   WBC 8.2 04/19/2017   HGB 8.0 (L) 04/20/2017   HCT 26.7 (L) 04/20/2017   MCV 86.6 04/19/2017   PLT 230 04/19/2017    Recent Labs  Lab 04/17/17 0937  04/21/17 0621  NA 137   < > 140  K 3.9   < > 4.2  CL 107   < > 111  CO2 19*   < > 22  BUN 40*   < > 33*  CREATININE 3.22*   < > 3.54*  CALCIUM 8.8*   < > 8.3*  PROT 8.2*  --   --   BILITOT 0.7  --   --   ALKPHOS 98  --   --   ALT 9*  --   --   AST 19  --   --   GLUCOSE 108*   < > 98   < > = values in this interval not displayed.       Radiology/Studies: Dg Chest 2 View  Result Date: 04/20/2017 CLINICAL DATA:  Congestive heart failure. EXAM: CHEST  2 VIEW COMPARISON:  April 17, 2017 FINDINGS: Stable cardiomegaly. Near complete resolution of pulmonary edema. No other interval changes or acute abnormalities. IMPRESSION: Stable  cardiomegaly.  Near complete resolution of pulmonary edema. Electronically Signed   By: Dorise Bullion III M.D   On: 04/20/2017 10:24   Mr Jodene Nam Head Wo Contrast  Result Date: 04/18/2017 CLINICAL DATA:  Acute development of speech disturbance beginning yesterday. EXAM: MRI HEAD WITHOUT CONTRAST MRA HEAD WITHOUT CONTRAST TECHNIQUE: Multiplanar, multiecho pulse sequences of the brain and surrounding structures were obtained without intravenous contrast. Angiographic images of the head were obtained using MRA technique without contrast. COMPARISON:  CT 04/17/2017 FINDINGS: MRI HEAD FINDINGS Brain: 5 cm region of acute infarction affecting the left temporoparietal junction consistent with left M3 branch occlusion infarction. Mild swelling. No hemorrhage or mass effect. The remainder the brain shows some chronic small-vessel ischemic changes of the hemispheric white matter on each side. No other large vessel territory infarction. No mass, hydrocephalus or extra-axial collection. Vascular: Major vessels at the base of the brain show flow. Skull and upper cervical spine: Negative Sinuses/Orbits: Clear/normal Other: None MRA HEAD FINDINGS Both internal carotid arteries are widely patent through the skull base and siphon regions. The anterior and middle cerebral vessels are patent without proximal stenosis, aneurysm or vascular malformation. There is probably a missing M3 branch posteriorly on the left. Left vertebral artery is dominant and widely patent to the basilar. The right vertebral artery is a small irregular vessel. There is probably acquired stenosis. No basilar stenosis. Superior cerebellar and posterior cerebral  arteries appear normal. IMPRESSION: Acute infarction in a left posterior M3 branch distribution affecting the temporoparietal junction, measuring about 5 cm in size. Swelling but no hemorrhage or mass effect. Mild chronic small-vessel ischemic changes elsewhere within the hemispheric white matter. No large vessel anterior circulation finding. Probable missing posterior M3 branch on the left. Stenotic distal right vertebral artery. Electronically Signed   By: Nelson Chimes M.D.   On: 04/18/2017 12:32   Mr Brain Wo Contrast  Result Date: 04/18/2017 CLINICAL DATA:  Acute development of speech disturbance beginning yesterday. EXAM: MRI HEAD WITHOUT CONTRAST MRA HEAD WITHOUT CONTRAST TECHNIQUE: Multiplanar, multiecho pulse sequences of the brain and surrounding structures were obtained without intravenous contrast. Angiographic images of the head were obtained using MRA technique without contrast. COMPARISON:  CT 04/17/2017 FINDINGS: MRI HEAD FINDINGS Brain: 5 cm region of acute infarction affecting the left temporoparietal junction consistent with left M3 branch occlusion infarction. Mild swelling. No hemorrhage or mass effect. The remainder the brain shows some chronic small-vessel ischemic changes of the hemispheric white matter on each side. No other large vessel territory infarction. No mass, hydrocephalus or extra-axial collection. Vascular: Major vessels at the base of the brain show flow. Skull and upper cervical spine: Negative Sinuses/Orbits: Clear/normal Other: None MRA HEAD FINDINGS Both internal carotid arteries are widely patent through the skull base and siphon regions. The anterior and middle cerebral vessels are patent without proximal stenosis, aneurysm or vascular malformation. There is probably a missing M3 branch posteriorly on the left. Left vertebral artery is dominant and widely patent to the basilar. The right vertebral artery is a small irregular vessel. There is probably acquired stenosis.  No basilar stenosis. Superior cerebellar and posterior cerebral arteries appear normal. IMPRESSION: Acute infarction in a left posterior M3 branch distribution affecting the temporoparietal junction, measuring about 5 cm in size. Swelling but no hemorrhage or mass effect. Mild chronic small-vessel ischemic changes elsewhere within the hemispheric white matter. No large vessel anterior circulation finding. Probable missing posterior M3 branch on the left. Stenotic distal right vertebral artery. Electronically Signed  By: Nelson Chimes M.D.   On: 04/18/2017 12:32   US Renal  Result Date: 04/21/2017 CLINICAL DATA:  Chronic kidney disease EXAM: RENAL / URINARY TRACT ULTRASOUND COMPLETE COMPARISON:  None. FINDINGS: Right Kidney: Length: 10.2 cm. Increased echotexture diffusely. No mass or hydronephrosis. Left Kidney: Length: 10.5 cm. Increased echotexture diffusely. No mass or hydronephrosis. Bladder: Appears normal for degree of bladder distention. IMPRESSION: Increased echotexture in the kidneys bilaterally compatible with chronic medical renal disease. Electronically Signed   By: Rolm Baptise M.D.   On: 04/21/2017 08:59   Dg Chest Portable 1 View  Result Date: 04/17/2017 CLINICAL DATA:  Shortness of Breath EXAM: PORTABLE CHEST 1 VIEW COMPARISON:  None. FINDINGS: Cardiomegaly with vascular congestion and bilateral perihilar and lower lobe opacities compatible with edema/CHF. No visible effusions or acute bony abnormality. IMPRESSION: Mild to moderate CHF. Electronically Signed   By: Rolm Baptise M.D.   On: 04/17/2017 11:56   Ct Head Code Stroke Wo Contrast  Result Date: 04/17/2017 CLINICAL DATA:  Code stroke.  Aphasia with blurred vision. EXAM: CT HEAD WITHOUT CONTRAST TECHNIQUE: Contiguous axial images were obtained from the base of the skull through the vertex without intravenous contrast. COMPARISON:  09/22/2012. FINDINGS: Brain: Cortically based areas of hypoattenuation affecting the LEFT posterior  parietal and superior occipital lobes, also with involvement of the underlying white matter. No associated hemorrhage. No mass lesion, hydrocephalus, or extra-axial fluid. Normal for age cerebral volume. Vascular: No hyperdense vessel or unexpected calcification. Skull: Normal. Negative for fracture or focal lesion. Sinuses/Orbits: No acute finding. Other: None. ASPECTS [facility] Stroke Program Early CT Score) - Ganglionic level infarction (caudate, lentiform nuclei, internal capsule, insula, M1-M3 cortex): 7 - Supraganglionic infarction (M4-M6 cortex): 2 Total score (0-10 with 10 being normal): 9 IMPRESSION: 1. Acute nonhemorrhagic infarction affecting the RIGHT occipital cortex and underlying white matter, as described above.Vascular distribution corresponds to the posterior-most M3 division LEFT MCA. 2. ASPECTS is 9. These results were called by telephone at the time of interpretation on 04/17/2017 at 9:51 a.m. to Dr. Leonel Ramsay , who verbally acknowledged these results. Electronically Signed   By: Staci Righter M.D.   On: 04/17/2017 10:05    12-lead ECG SR (personally reviewed) All prior EKG's in EPIC reviewed with no documented atrial fibrillation  Telemetry sinus rhythm, short runs mid-RP tachycardia  (personally reviewed)  Assessment and Plan:  1. Cryptogenic stroke The patient presents with cryptogenic stroke.  TEE demonstrated no cause for stroke.  I spoke at length with the patient about monitoring for afib with an implantable loop recorder.  Risks, benefits, and alteratives to implantable loop recorder were discussed with the patient today.   At this time, the patient is very clear in their decision to proceed with implantable loop recorder.   Wound care was reviewed with the patient (keep incision clean and dry for 3 days).  Wound check scheduled and entered in AVS.  Please call with questions.   Chanetta Marshall, NP 04/22/2017 7:43 AM  I have seen and examined this patient with Chanetta Marshall.  Agree with above, note added to reflect my findings.  On exam, RRR, no murmurs, lungs clear. Had cryptogenic stroke with speech difficulties. No cause on workup to date. Oseas Detty plan for LINQ implant. Risks include bleeding and infection. The patient understands the risks and has agreed to the procedure.    Darenda Fike M. Zaylynn Rickett MD 04/22/2017 7:54 AM

## 2017-04-22 NOTE — Progress Notes (Signed)
Pt BP 176/106. MD notified.  Will continue to monitor.

## 2017-04-22 NOTE — Progress Notes (Addendum)
NEUROHOSPITALISTS STROKE TEAM - DAILY PROGRESS NOTE   SUBJECTIVE (INTERVAL HISTORY) Daughter at bedside. Patient is found laying in bed in NAD. Patient voices no new complaints. Denies H/A or new weakness. Blood pressure continues to be elevated. Patient states her B/P medications were given late last night.   OBJECTIVE Lab Results: CBC:  Recent Labs  Lab 04/18/17 0132  04/19/17 1258 04/20/17 0354 04/22/17 0736  WBC 11.5*  --  8.2  --  8.8  HGB 8.5*   < > 8.6* 8.0* 9.2*  HCT 28.5*   < > 29.1* 26.7* 30.1*  MCV 85.8  --  86.6  --  86.5  PLT 267  --  230  --  226   < > = values in this interval not displayed.   BMP: Recent Labs  Lab 04/17/17 0937 04/17/17 0944 04/17/17 1648 04/19/17 0752 04/20/17 0354 04/21/17 0621 04/22/17 0736  NA 137 141  --  140 136 140 138  K 3.9 4.0  --  3.7 3.8 4.2 3.6  CL 107 109  --  108 108 111 107  CO2 19*  --   --  22 21* 22 19*  GLUCOSE 108* 107*  --  97 94 98 87  BUN 40* 36*  --  38* 35* 33* 31*  CREATININE 3.22* 3.40*  --  3.65* 3.60* 3.54* 3.41*  CALCIUM 8.8*  --   --  8.9 8.1* 8.3* 8.9  MG  --   --  1.9 2.0 1.9  --   --   PHOS  --   --   --  4.4 4.7* 4.5 3.8   Liver Function Tests:  Recent Labs  Lab 04/17/17 0937 04/20/17 0354 04/21/17 0621 04/22/17 0736  AST 19  --   --   --   ALT 9*  --   --   --   ALKPHOS 98  --   --   --   BILITOT 0.7  --   --   --   PROT 8.2*  --   --   --   ALBUMIN 3.4* 2.9* 2.7* 3.1*   Cardiac Enzymes:  Recent Labs  Lab 04/17/17 1712 04/18/17 0132  TROPONINI 0.29* 0.27*   Coagulation Studies:  No results for input(s): APTT, INR in the last 72 hours. PHYSICAL EXAM Temp:  [98.1 F (36.7 C)-99.1 F (37.3 C)] 98.3 F (36.8 C) (01/14 1350) Pulse Rate:  [59-81] 75 (01/14 1350) Resp:  [16-18] 18 (01/14 1350) BP: (168-222)/(73-107) 176/107 (01/14 1350) SpO2:  [94 %-100 %] 100 % (01/14 1350)   Vitals:   04/22/17 0516 04/22/17 1028 04/22/17  1202 04/22/17 1350  BP: (!) 168/79 (!) 213/94 (!) 191/91 (!) 176/107  Pulse: (!) 59 65 65 75  Resp: '16 16  18  ' Temp:  98.1 F (36.7 C)  98.3 F (36.8 C)  TempSrc:  Oral  Oral  SpO2: 98% 96%  100%  Weight:      Height:       General - Well nourished, well developed, in no apparent distress HEENT-  Normocephalic, Normal external eye/conjunctiva.  Normal external ears. Normal external nose, mucus membranes and septum.   Rt Upper Extremity - several subcutaneous nodules just above the right elbow which are tender to the touch. No erythema noted. No drainage. Cardiovascular - Regular rate and rhythm  Respiratory -decreased breath sounds throughout -especially at bases.  No wheezes or rhonchi noted. Abdomen - soft and non-tender, BS normal Extremities- 1+ edema. No cyanosis  Neuro Exam : Mental Status: Patient is awake, alert, she is able to answer questions, with mild expressive aphasia, Patient is able to give a clear and coherent history. No signs  neglect Cranial Nerves: II: Visual Fields are full. Pupils are equal, round, and reactive to light.   III,IV, VI: EOMI without ptosis or diploplia.  V: Facial sensation is symmetric to temperature VII: Facial movement is symmetric.  VIII: hearing is intact to voice X: Uvula elevates symmetrically XI: Shoulder shrug is symmetric. XII: tongue is midline without atrophy or fasciculations.  Motor: Tone is normal. Bulk is normal. 5/5 strength was present in all four extremities.  Sensory: Sensation is symmetric to light touch and temperature in the arms and legs. Cerebellar: FNF and HKS are intact bilaterally  IMAGING: I have personally reviewed the radiological images below and agree with the radiology interpretations.  Renal US 04/21/2017 IMPRESSION: Increased echotexture in the kidneys bilaterally compatible with chronic medical renal disease  MRI/MRA Head/Brain Wo Contrast Result Date: 04/18/2017 IMPRESSION: Acute infarction in  a left posterior M3 branch distribution affecting the temporoparietal junction, measuring about 5 cm in size. Swelling but no hemorrhage or mass effect. Mild chronic small-vessel ischemic changes elsewhere within the hemispheric white matter. No large vessel anterior circulation finding. Probable missing posterior M3 branch on the left. Stenotic distal right vertebral artery. Electronically Signed   By: Nelson Chimes M.D.   On: 04/18/2017 12:32   Dg Chest Portable 1 View Result Date: 04/17/2017  IMPRESSION: Mild to moderate CHF. Electronically Signed   By: Rolm Baptise M.D.   On: 04/17/2017 11:56   Ct Head Code Stroke Wo Contrast Result Date: 04/17/2017 IMPRESSION: 1. Acute nonhemorrhagic infarction affecting the RIGHT occipital cortex and underlying white matter, as described above.Vascular distribution corresponds to the posterior-most M3 division LEFT MCA. 2. ASPECTS is 9. These results were called by telephone at the time of interpretation on 04/17/2017 at 9:51 a.m. to Dr. Leonel Ramsay , who verbally acknowledged these results. Electronically Signed   By: Staci Righter M.D.   On: 04/17/2017 10:05   Echocardiogram:                                               Study Conclusions - Left ventricle: The cavity size was normal. Wall thickness was   normal. Systolic function was normal. The estimated ejection   fraction was in the range of 60% to 65%. - Aortic valve: There was mild regurgitation. - Mitral valve: There was mild regurgitation. - Left atrium: The atrium was moderately dilated. - Right atrium: The atrium was mildly dilated. - Atrial septum: No defect or patent foramen ovale was identified.  B/L Carotid U/S:                                                 Final Interpretation: Right Carotid: There is evidence in the right ICA of a 1-39% stenosis. Left Carotid: There is evidence in the left ICA of a 1-39% stenosis. Vertebrals: Left vertebral artery was patent with antegrade flow.  Right vertebral appears partially occluded.  TEE  normal    IMPRESSION: Erin Dean is a 52 y.o. female with PMH of HTN who presented with an acute  onset of severe expressive aphasia, She improved markedly with the IV TPA, but then began complaining of abdominal pain.  She started vomiting with concern for hematemesis. Unfortunately, I would be very hesitant to reverse this even if there is some GI bleeding(Gastroccult pending) even her rapid improvement.  I suspect the elevated troponin is related to the stroke, no chest pain or EKG change, will trend.   Acute infarction in a left posterior M3 branch distribution affecting the temporoparietal junction, measuring about 5 cm in size of likely embolic etiology  Acute nonhemorrhagic infarction affecting the RIGHT occipital cortex and underlying white matter,   Suspected Etiology:   cardioembolic or cryptogenic  source, Resultant Symptoms: severe expressive aphasia, Stroke Risk Factors: hyperlipidemia and hypertension Other Stroke Risk Factors: Obesity, Body mass index is 40.4 kg/m.   Outstanding Work-up Studies:     Renal Artery Ultrasound  Renal Labs Hypercoagulable workup pending  04/19/2017 Neuro exam stable. Mild expressive aphasia. Continues to have elevated B/P. MRI post tPA without evidence of bleeding. Appreciate CCM assistance with multiple medical problems.  Continue to monitor hematocrit. Mobilize out of bed. Therapy consults. May discharge home in morning if stable  04/21/2017                            Patient has elevated creatinine, with anemia, but stable.  Nephrology consulted, Lasix added. Currently on bid dosing, will need daily dosing on discharge. BP still fluctuate, on labetalol PRN for SBP > 180. Currently on norvasc, coreg, hydralazine and lasix.  PT OT recommend home PT/OT.  04/22/2017 ASSESSMENT:   Neuro exam remains stable. Blood pressure continues to be elevated. Clonidine added and Coreg dose increased.  Renal Artery U/S and labs pending. Will likely discharge home in AM if B/P's stable overnight.  PLAN  04/22/2017: Continue Statin/ ASA 325 mg daily Frequent neuro checks Telemetry monitoring PT/OT/SLP Ongoing aggressive stroke risk factor management Patient counseled to be compliant with her antithrombotic medications Patient counseled on Lifestyle modifications including, Diet, Exercise, and Stress Follow up with [facility] in 6 weeks  HX OF STROKES: Thought to be embolic.  Left parietal small infarct, looks old  R/O AFIB  TEE  - no source of emboli  Loop Placed today  MEDICAL ISSUES:  Abnormal EKG- Prolonged QTc and Elevated Troponins  Cardiology Consultation  TEE - no cardiac source of emboli  AKI   Unclear baseline ( 2.25 in 12/2015) ( 1.00 2014)  Appreciate nephrology consult Dr Jonnie Finner  Outpatient follow-up with nephrology  Renal ultrasound - consistent with chronic medical renal disease.  Right VA stenosis.   Renal Artery Ultrasound and labs - pending  Hypocalcemia   Supplemented  Calcium now 8.9   Ionized Calcium pending  Acute Hypoxic Respiratory Failure Related to Pulmonary Edema  Resolved - CCM signed off  CHF  Cardiology saw the patient for the TEE, however, they do not appear to be following the patient.  Normal EF on TEE.  Repeat chest x-ray 04/20/2016 - Stable cardiomegaly.  Near complete resolution of pulmonary edema.  Continue Lasix per nephrology, change to daily dosing at discharge  Concern for UGIB s/p TPA  IV Protonix in progress  GI Consulted  Continue PO Protonix  Anemia Stable, trending up slowly Appears chronic -> anemia panel mostly negative Repeat CBC in 1 wk.  Iron Sat low at 9.0 - TIBC 297 nl.  - Iron 28 low normal -> add supplement Folate  normal, B12 low normal - will add supplementation  Tender Nodules Right Upper Extremity  Possibly related to IV - IV team to assess  HTN Crisis    Suspect pt has very poorly controlled HTN at home, previous 3 med regimen  Previous R/O for RAS  Unstable - Long term BP goal normotensive  Labetolol PRN  Blood pressure still running high   Increased Coreg to 12.5 mg BID and Added Clonidine 0.1 mg TID  Continue Norvasc and Hydralazine  Renal Artery Ultrasound and labs - pending  HYPERLIPIDEMIA:    Component Value Date/Time   CHOL 185 04/18/2017 0132   TRIG 73 04/18/2017 0132   HDL 41 04/18/2017 0132   CHOLHDL 4.5 04/18/2017 0132   VLDL 15 04/18/2017 0132   LDLCALC 129 (H) 04/18/2017 0132   Home Meds:  NONE  LDL  goal < 70  Started on Lipitor to 40 mg daily  Continue statin at discharge  R/O DIABETES: Lab Results  Component Value Date   HGBA1C 5.2 04/18/2017   HgbA1c goal < 7.0  OBESITY  Obesity, Body mass index is 40.4 kg/m. 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 Yes; Fluid consistency: Thin   FAMILY UPDATES: family at bedside  TEAM UPDATES: Rosalin Hawking, MD   Prior Home Stroke Medications:  No antithrombotic  Discharge Stroke Meds: ASA 325 mg daily Disposition: HOME with HH Follow Up:  Follow-up Information    Garvin Fila, MD. Schedule an appointment as soon as possible for a visit in 6 week(s).   Specialties:  Neurology, Radiology Contact information: [place] [address] [phone]        [place] [facility] Follow up on 05/02/2017.   Specialty:  Cardiology Why:  at [facility] information: [address], [address] [place]         Lois Hux[place], [place] -PCP Follow up in 1-2 weeks        [facility] Stroke Team 04/22/2017, 3:11 PM  ATTENDING NOTE: I  reviewed above note and agree with the assessment and plan. I have made any additions or clarifications directly to the above note. Pt was seen and examined.   52 year old female with history of hypertension, CKD, obesity admitted for aphasia and difficulty speaking.  Status post TPA symptoms improved.  Developed [place] upper GI bleeding.  EGD negative.  MRI showed left inferior MCA moderate-sized infarct.  MRA largely unremarkable except right VA occlusion.  LDL 129 and A1c 5.2, although ESR 67 but CRP normal. EF 60-65% carotid Doppler negative.  TEE also negative without any PFO. Loop recorder placed.  Patient stroke etiology unclear, hypercoagulable workup pending.  Patient still has high BP although with multiple BP meds. She has elevated creatinine, with anemia, but stable.  Nephrology consulted, on Lasix bid. Also on labetalol PRN for SBP > 180. Currently on norvasc, coreg, hydralazine and lasix. Will increase coreg dose and added clonidine. Will persue renal artery [place] and check malignant HTN labs.  PT OT recommend home PT/OT. Expect d/c tomorrow.   Rosalin Hawking, MD PhD Stroke Neurology 04/22/2017 11:45 PM   To contact Stroke Continuity provider, please refer to [email or web address] After hours, contact General Neurology

## 2017-04-23 ENCOUNTER — Inpatient Hospital Stay (HOSPITAL_COMMUNITY): Payer: BLUE CROSS/BLUE SHIELD

## 2017-04-23 ENCOUNTER — Encounter (HOSPITAL_COMMUNITY): Payer: Self-pay | Admitting: Cardiology

## 2017-04-23 DIAGNOSIS — I63412 Cerebral infarction due to embolism of left middle cerebral artery: Principal | ICD-10-CM

## 2017-04-23 DIAGNOSIS — I160 Hypertensive urgency: Secondary | ICD-10-CM

## 2017-04-23 LAB — RENAL FUNCTION PANEL
ANION GAP: 10 (ref 5–15)
Albumin: 3 g/dL — ABNORMAL LOW (ref 3.5–5.0)
BUN: 34 mg/dL — ABNORMAL HIGH (ref 6–20)
CHLORIDE: 106 mmol/L (ref 101–111)
CO2: 22 mmol/L (ref 22–32)
Calcium: 8.9 mg/dL (ref 8.9–10.3)
Creatinine, Ser: 3.64 mg/dL — ABNORMAL HIGH (ref 0.44–1.00)
GFR, EST AFRICAN AMERICAN: 15 mL/min — AB (ref >60)
GFR, EST NON AFRICAN AMERICAN: 13 mL/min — AB (ref >60)
Glucose, Bld: 99 mg/dL (ref 65–99)
POTASSIUM: 3.7 mmol/L (ref 3.5–5.1)
Phosphorus: 4.4 mg/dL (ref 2.5–4.6)
Sodium: 138 mmol/L (ref 135–145)

## 2017-04-23 LAB — BETA-2-GLYCOPROTEIN I ABS, IGG/M/A: Beta-2 Glyco I IgG: <9 GPI IgG units (ref 0–20)

## 2017-04-23 LAB — CBC
HEMATOCRIT: 28.1 % — AB (ref 36.0–46.0)
HEMOGLOBIN: 8.6 g/dL — AB (ref 12.0–15.0)
MCH: 25.8 pg — ABNORMAL LOW (ref 26.0–34.0)
MCHC: 30.6 g/dL (ref 30.0–36.0)
MCV: 84.4 fL (ref 78.0–100.0)
Platelets: 288 10*3/uL (ref 150–400)
RBC: 3.33 MIL/uL — AB (ref 3.87–5.11)
RDW: 16.2 % — ABNORMAL HIGH (ref 11.5–15.5)
WBC: 7.5 10*3/uL (ref 4.0–10.5)

## 2017-04-23 LAB — CARDIOLIPIN ANTIBODIES, IGG, IGM, IGA
Anticardiolipin IgG: <9 GPL U/mL (ref 0–14)
Anticardiolipin IgM: <9 MPL U/mL (ref 0–12)

## 2017-04-23 LAB — CALCIUM, IONIZED: Calcium, Ionized, Serum: 4.7 mg/dL (ref 4.5–5.6)

## 2017-04-23 MED ORDER — HYDRALAZINE HCL 100 MG PO TABS: 100.0000 mg | ORAL_TABLET | Freq: Three times a day (TID) | ORAL | 1 refills | Status: DC

## 2017-04-23 MED ORDER — CYANOCOBALAMIN 1000 MCG PO TABS: 1000.0000 ug | ORAL_TABLET | Freq: Every day | ORAL | 1 refills | Status: DC

## 2017-04-23 MED ORDER — ASPIRIN 325 MG PO TBEC: 325.0000 mg | DELAYED_RELEASE_TABLET | Freq: Every day | ORAL | 0 refills | Status: DC

## 2017-04-23 MED ORDER — CARVEDILOL 12.5 MG PO TABS: 12.5000 mg | ORAL_TABLET | Freq: Two times a day (BID) | ORAL | 1 refills | Status: DC

## 2017-04-23 MED ORDER — ATORVASTATIN CALCIUM 40 MG PO TABS: 40.0000 mg | ORAL_TABLET | Freq: Every day | ORAL | 1 refills | Status: DC

## 2017-04-23 MED ORDER — FERROUS SULFATE 325 (65 FE) MG PO TABS: 325.0000 mg | ORAL_TABLET | Freq: Three times a day (TID) | ORAL | 3 refills | Status: DC

## 2017-04-23 MED ORDER — AMLODIPINE BESYLATE 10 MG PO TABS: 10.0000 mg | ORAL_TABLET | Freq: Every day | ORAL | 3 refills | Status: DC

## 2017-04-23 MED ORDER — PANTOPRAZOLE SODIUM 40 MG PO TBEC: 40.0000 mg | DELAYED_RELEASE_TABLET | Freq: Every day | ORAL | 1 refills | Status: DC

## 2017-04-23 MED ORDER — FUROSEMIDE 80 MG PO TABS: 80.0000 mg | ORAL_TABLET | Freq: Every day | ORAL | 1 refills | Status: DC

## 2017-04-23 MED ORDER — CLONIDINE HCL 0.1 MG PO TABS: 0.1000 mg | ORAL_TABLET | Freq: Three times a day (TID) | ORAL | 11 refills | Status: DC

## 2017-04-23 NOTE — Progress Notes (Signed)
  Speech Language Pathology Treatment: Cognitive-Linquistic  Patient Details Name: Erin Dean MRN: [id number] DOB: 07/28/1965 Today's Date: 04/23/2017 Time: 1025-1040 SLP Time Calculation (min) (ACUTE ONLY): 15 min  Assessment / Plan / Recommendation Clinical Impression  Pt was seen for skilled ST targeting cognitive-linguistic goals.  Pt reports significant improvement of symptoms since last ST treatment session.  Pt completed a semi-complex generative naming task for ~80% accuracy independently, which improved to 100% accuracy with min assist semantic cues.  No overt word finding deficits noted in conversations.  Pt reports feeling back to baseline for cognition as well.  SLP discussed memory compensatory strategies given reports of ongoing memory deficits impacting performance in most recent OT therapy session.  Pt reports that she is hopeful to discharge today and her daughter is planning to move in with her to provide supervision.  All questions were answered to pt's satisfaction at this time.   Continue per current plan of care.    HPI HPI: 52 year old left handed female who presented with aphasia, blurred vision, and headache ischemic infarct LEFT posterior parietal and superior occipital lobes. PMHx: HTN, heart murmur      SLP Plan  Continue with current plan of care       Recommendations                   Follow up Recommendations: Outpatient SLP;24 hour supervision/assistance SLP Visit Diagnosis: Cognitive communication deficit (R41.841);Aphasia (R47.01) Plan: Continue with current plan of care       GO                PageSelinda Orion 04/23/2017, 10:46 AM

## 2017-04-23 NOTE — Discharge Summary (Signed)
Stroke Discharge Summary  Patient ID: Erin Dean   MRN: [id number]      DOB: Jan 19, 1966  Date of Admission: 04/17/2017 Date of Discharge: 04/23/2017  Attending Physician:  Rosalin Hawking, MD, Stroke MD Consultant(s):   Treatment Team:  Stroke, Md, MD cardiology, pulmonary/intensive care, GI and nephrology , Interventional Radiology Patient's PCP:  Lois Huxley, PA  DISCHARGE DIAGNOSIS:  Left MCA infarct s/p loop recorder  Active Problems:   Hypertensive emergency   Demand ischemia ([place]) AKI on CKD Anemia Hyperlipidemia Obesity  Past Medical History:  Diagnosis Date  . Hypertension   . Stroke ([place])   . SVT (supraventricular tachycardia) ([facility])    Past Surgical History:  Procedure Laterality Date  . CESAREAN SECTION    . ESOPHAGOGASTRODUODENOSCOPY N/A 04/19/2017   Procedure: ESOPHAGOGASTRODUODENOSCOPY (EGD);  Surgeon: Carol Ada, MD;  Location: [facility];  Service: Endoscopy;  Laterality: N/A;  . LOOP RECORDER INSERTION N/A 04/22/2017   Procedure: LOOP RECORDER INSERTION;  Surgeon: Constance Haw, MD;  Location: [facility];  Service: Cardiovascular;  Laterality: N/A;  . TEE WITHOUT CARDIOVERSION N/A 04/19/2017   Procedure: TRANSESOPHAGEAL ECHOCARDIOGRAM (TEE);  Surgeon: Carol Ada, MD;  Location: [facility];  Service: Endoscopy;  Laterality: N/A;    Allergies as of 04/23/2017   No Known Allergies     Medication List    STOP taking these medications   spironolactone 25 MG tablet Commonly known as:  ALDACTONE   valsartan-hydrochlorothiazide 320-25 MG tablet Commonly known as:  DIOVAN-HCT     TAKE these medications   ALPRAZolam 0.5 MG tablet Commonly known as:  XANAX Take 0.25-0.5 mg by mouth 3 (three) times daily as needed (anxiety). Pt has not taken yet   amLODipine 10 MG tablet Commonly known as:  NORVASC Take 1 tablet (10 mg total) by mouth daily.   aspirin 325 MG EC tablet Take 1 tablet (325 mg total) by mouth daily. Start  taking on:  04/24/2017   atorvastatin 40 MG tablet Commonly known as:  LIPITOR Take 1 tablet (40 mg total) by mouth daily at 6 PM.   carvedilol 12.5 MG tablet Commonly known as:  COREG Take 1 tablet (12.5 mg total) by mouth 2 (two) times daily. What changed:    medication strength  how much to take   cloNIDine 0.1 MG tablet Commonly known as:  CATAPRES Take 1 tablet (0.1 mg total) by mouth 3 (three) times daily.   cyanocobalamin 1000 MCG tablet Take 1 tablet (1,000 mcg total) by mouth daily. Start taking on:  04/24/2017   escitalopram 10 MG tablet Commonly known as:  LEXAPRO Take 10 mg by mouth daily.   ferrous sulfate 325 (65 FE) MG tablet Take 1 tablet (325 mg total) by mouth 3 (three) times daily with meals. What changed:  when to take this   furosemide 80 MG tablet Commonly known as:  LASIX Take 1 tablet (80 mg total) by mouth daily.   hydrALAZINE 100 MG tablet Commonly known as:  APRESOLINE Take 1 tablet (100 mg total) by mouth 3 (three) times daily. What changed:    medication strength  how much to take   HYDROcodone-acetaminophen 5-325 MG tablet Commonly known as:  NORCO/VICODIN Take 2 tablets by mouth every 4 (four) hours as needed.   pantoprazole 40 MG tablet Commonly known as:  PROTONIX Take 1 tablet (40 mg total) by mouth daily. Start taking on:  04/24/2017      LABORATORY STUDIES CBC  Component Value Date/Time   WBC 7.5 04/23/2017 0817   RBC 3.33 (L) 04/23/2017 0817   HGB 8.6 (L) 04/23/2017 0817   HCT 28.1 (L) 04/23/2017 0817   PLT 288 04/23/2017 0817   MCV 84.4 04/23/2017 0817   MCH 25.8 (L) 04/23/2017 0817   MCHC 30.6 04/23/2017 0817   RDW 16.2 (H) 04/23/2017 0817   LYMPHSABS 1.2 04/17/2017 0937   MONOABS 0.7 04/17/2017 0937   EOSABS 0.2 04/17/2017 0937   BASOSABS 0.0 04/17/2017 0937   CMP    Component Value Date/Time   NA 138 04/23/2017 0817   K 3.7 04/23/2017 0817   CL 106 04/23/2017 0817   CO2 22 04/23/2017 0817   GLUCOSE  99 04/23/2017 0817   BUN 34 (H) 04/23/2017 0817   CREATININE 3.64 (H) 04/23/2017 0817   CREATININE 2.25 (H) 12/23/2015 0924   CALCIUM 8.9 04/23/2017 0817   PROT 8.2 (H) 04/17/2017 0937   ALBUMIN 3.0 (L) 04/23/2017 0817   AST 19 04/17/2017 0937   ALT 9 (L) 04/17/2017 0937   ALKPHOS 98 04/17/2017 0937   BILITOT 0.7 04/17/2017 0937   GFRNONAA 13 (L) 04/23/2017 0817   GFRAA 15 (L) 04/23/2017 0817   COAGS Lab Results  Component Value Date   INR 1.23 04/19/2017   INR 1.08 04/17/2017   Lipid Panel    Component Value Date/Time   CHOL 185 04/18/2017 0132   TRIG 73 04/18/2017 0132   HDL 41 04/18/2017 0132   CHOLHDL 4.5 04/18/2017 0132   VLDL 15 04/18/2017 0132   LDLCALC 129 (H) 04/18/2017 0132   HgbA1C  Lab Results  Component Value Date   HGBA1C 5.2 04/18/2017   Urinalysis    Component Value Date/Time   COLORURINE YELLOW 04/20/2017 0210   APPEARANCEUR CLEAR 04/20/2017 0210   LABSPEC 1.014 04/20/2017 0210   PHURINE 5.0 04/20/2017 0210   GLUCOSEU 50 (A) 04/20/2017 0210   HGBUR NEGATIVE 04/20/2017 0210   BILIRUBINUR NEGATIVE 04/20/2017 0210   KETONESUR NEGATIVE 04/20/2017 0210   PROTEINUR 100 (A) 04/20/2017 0210   NITRITE NEGATIVE 04/20/2017 0210   LEUKOCYTESUR NEGATIVE 04/20/2017 0210   Urine Drug Screen No results found for: LABOPIA, COCAINSCRNUR, LABBENZ, AMPHETMU, THCU, LABBARB  Alcohol Level No results found for: [facility]  SIGNIFICANT DIAGNOSTIC STUDIES Renal US 04/21/2017 IMPRESSION: Increased echotexture in the kidneys bilaterally compatible with chronic medical renal disease  MRI/MRA Head/Brain Wo Contrast Result Date: 04/18/2017 IMPRESSION: Acute infarction in a left posterior M3 branch distribution affecting the temporoparietal junction, measuring about 5 cm in size. Swelling but no hemorrhage or mass effect. Mild chronic small-vessel ischemic changes elsewhere within the hemispheric white matter. No large vessel anterior circulation finding. Probable missing  posterior M3 branch on the left. Stenotic distal right vertebral artery. Electronically Signed   By: Nelson Chimes M.D.   On: 04/18/2017 12:32   Dg Chest Portable 1 View Result Date: 04/17/2017  IMPRESSION: Mild to moderate CHF. Electronically Signed   By: Rolm Baptise M.D.   On: 04/17/2017 11:56   Ct Head Code Stroke Wo Contrast Result Date: 04/17/2017 IMPRESSION: 1. Acute nonhemorrhagic infarction affecting the RIGHT occipital cortex and underlying white matter, as described above.Vascular distribution corresponds to the posterior-most M3 division LEFT MCA. 2. ASPECTS is 9. These results were called by telephone at the time of interpretation on 04/17/2017 at 9:51 a.m. to Dr. Leonel Ramsay , who verbally acknowledged these results. Electronically Signed   By: Staci Righter M.D.   On: 04/17/2017 10:05   Echocardiogram:  Study Conclusions - Left ventricle: The cavity size was normal. Wall thickness was normal. Systolic function was normal. The estimated ejection fraction was in the range of 60% to 65%. - Aortic valve: There was mild regurgitation. - Mitral valve: There was mild regurgitation. - Left atrium: The atrium was moderately dilated. - Right atrium: The atrium was mildly dilated. - Atrial septum: No defect or patent foramen ovale was identified.  B/L Carotid U/S:                                                 Final Interpretation: Right Carotid: There is evidence in the right ICA of a 1-39% stenosis. Left Carotid: There is evidence in the left ICA of a 1-39% stenosis. Vertebrals: Left vertebral artery was patent with antegrade flow. Right vertebral appears partially occluded.  TEE  normal  Renal artery duplex prelim: right 1-59% RAS, left no evidence of RAS.   HISTORY OF [place] is a 52 y.o. female who awoke in her normal state of health and was at work at 5:30 AM when she  developed difficulty speaking.  She states that it was an abrupt change in that she was normal before this.  She denies numbness or weakness.  On arrival, she had a severe expressive aphasia, but was able to answer yes/no questions with head nods/shakes.  She was given IV TPA.  She is an AK I with low NIH and therefore CTA was not done, MRA was attempted but she was too short of breath when laying flat and therefore this was not done. She improved markedly with the IV TPA, but then  Began complaining of abdominal pain.  She started vomiting with concern for hematemesis.  LKW: 5:30 AM tpa given?:  Modified Rankin Score: 0  IMPRESSION: Erin Dean is a 52 y.o. female with PMH of HTN who presented with an acute onset of severe expressive aphasia, She improved markedly with the IV TPA, but then began complaining of abdominal pain.  She started vomiting with concern for hematemesis. Unfortunately, I would be very hesitant to reverse this even if there is some GI bleeding(Gastroccult pending) even her rapid improvement.  I suspect the elevated troponin is related to the stroke, no chest pain or EKG change, will trend.   Acute infarction in a left posterior M3 branch distribution affecting the temporoparietal junction, measuring about 5 cm in size of likely embolic etiology  Acute nonhemorrhagic infarction affecting the RIGHT occipital cortex and underlying white matter,   Suspected Etiology:   cardioembolic or cryptogenic  source, Resultant Symptoms: severe expressive aphasia, Stroke Risk Factors: hyperlipidemia and hypertension Other Stroke Risk Factors: Obesity, Body mass index is 40.4 kg/m.   Outstanding Work-up Studies:     Renal Labs Hypercoagulable workup pending  04/19/2017 Neuro exam stable. Mild expressive aphasia. Continues to have elevated B/P. MRI post tPA without evidence of bleeding. Appreciate CCM assistance with multiple medical problems.  Continue to monitor  hematocrit. Mobilize out of bed. Therapy consults. May discharge home in morning if stable  04/21/2017                            Patient has elevated creatinine, with anemia, but stable.  Nephrology consulted,  Lasix added. Currently on bid dosing, will need daily dosing on discharge. BP still fluctuate, on labetalol PRN for SBP > 180. Currently on norvasc, coreg, hydralazine and lasix.  PT OT recommend home PT/OT.  04/22/2017 ASSESSMENT:   Neuro exam remains stable. Blood pressure continues to be elevated. Clonidine added and Coreg dose increased. Renal Artery U/S and labs pending. Will likely discharge home in AM if B/P's stable overnight.  04/23/2017: Renal Artery ultrasound negative for stenosis. B/P better managed overnight. Patient ready for discharge home. Patient to follow up with PCP this week for labs. Patient and duaghter both verbalized good understanding of this information  PLAN  04/23/2017: Continue Statin/ ASA 325 mg daily Discharge home to family Ongoing aggressive stroke risk factor management Patient counseled to be compliant withher antithrombotic medications Patient counseled on Lifestyle modifications including, Diet, Exercise, and Stress Follow up with [facility] in 6 weeks  HX OF STROKES: Thought to be embolic.  Left parietal small infarct, looks old  R/O AFIB  TEE  - no source of emboli  Loop Placed   MEDICAL ISSUES:  Abnormal EKG- Prolonged QTc and Elevated Troponins  Cardiology Consultation  TEE - no cardiac source of emboli  AKI   Unclear baseline ( 2.25 in 12/2015)( 1.00 2014)  Appreciate nephrology consult Dr Jonnie Finner  Outpatient follow-up with nephrology  Renal ultrasound - consistent with chronic medical renal disease.  Right VA stenosis.   Renal Artery Ultrasound negative  Hypocalcemia   Supplemented  Calcium now 8.9   Ionized Calcium normal at 4.7  Acute Hypoxic Respiratory Failure Related toPulmonary  Edema  Resolved - CCM signed off  CHF  Cardiology saw the patient for the TEE, however, they do not appear to be following the patient.  Normal EF on TEE.  Repeat chest x-ray 04/20/2016 - Stable cardiomegaly. Near complete resolution of pulmonary edema.  Continue Lasix per nephrology, changed to daily dosing at discharge  Check BMP this week at PCP office  Concern for UGIB s/p TPA  IV Protonix in progress  GI Consulted  Continue PO Protonix  Anemia Stable, trending up slowly Appears chronic-> anemia panel mostly negative Repeat CBC this week at PCP office  Iron Sat low at 9.0 - TIBC 297 nl.  - Iron 28 low normal -> add supplement Folate normal, B12 low normal - will add supplementation  Tender Nodules Right Upper Extremity  Possibly related to IV - IV team to assess  HTN Crisis  Suspect pt has very poorly controlled HTN at home, previous 3 med regimen  Previous R/O for RAS  Unstable - Long term BP goal normotensive  Labetolol PRN  Blood pressure better controlled overnight  Increased Coreg to 12.5 mg BID and Added Clonidine 0.1 mg TID  Continue Norvasc and Hydralazine  Renal Artery Ultrasound negative  HYPERLIPIDEMIA: Labs(Brief)            Component  Value  Date/Time     CHOL  185  04/18/2017 0132     TRIG  73  04/18/2017 0132     HDL  41  04/18/2017 0132     CHOLHDL  4.5  04/18/2017 0132     VLDL  15  04/18/2017 0132     LDLCALC  129 (H)  04/18/2017 0132     Home Meds:  NONE  LDL  goal < 70  Started on Lipitor to 40 mg daily  Continue statin at discharge  R/O DIABETES: RecentLabs  Lab Results   Component  Value  Date     HGBA1C  5.2  04/18/2017     HgbA1c goal < 7.0  OBESITY  Obesity, Body mass index is 40.4 kg/m. Greater than/equal to 30  DISCHARGE EXAM Blood pressure (!) 166/78, pulse (!) 55, temperature 98.1 F (36.7 C), temperature source Oral, resp. rate 18,  height _0  (1.702 m), weight 117 kg (257 lb 15 oz), SpO2 92 %. General - Well nourished, well developed, in no apparent distress HEENT-  Normocephalic, Normal external eye/conjunctiva.  Normal external ears. Normal external nose, mucus membranes and septum.   Rt Upper Extremity - several subcutaneous nodules just above the right elbow which are tender to the touch. No erythema noted. No drainage. Cardiovascular - Regular rate and rhythm  Respiratory -decreased breath sounds throughout -especially at bases.  No wheezes or rhonchi noted. Abdomen - soft and non-tender, BS normal Extremities- 1+ edema. No cyanosis  Neuro Exam : Mental Status: Patient is awake, alert, she is able to answer questions, with mild expressive aphasia, Patient is able to give a clear and coherent history. No signs  neglect Cranial Nerves: II: Visual Fields are full. Pupils are equal, round, and reactive to light.   III,IV, VI: EOMI without ptosis or diploplia.  V: Facial sensation is symmetric to temperature VII: Facial movement is symmetric.  VIII: hearing is intact to voice X: Uvula elevates symmetrically XI: Shoulder shrug is symmetric. XII: tongue is midline without atrophy or fasciculations.  Motor: Tone is normal. Bulk is normal. 5/5 strength was present in all four extremities.  Sensory: Sensation is symmetric to light touch and temperature in the arms and legs. Cerebellar: FNF and HKS are intact bilaterally  Discharge Diet   Diet Heart Room service appropriate? Yes; Fluid consistency: Thin liquids  DISCHARGE PLAN  Disposition:  HOME  aspirin 325 mg daily for secondary stroke prevention.  Ongoing risk factor control by Primary Care Physician at time of discharge  Follow-up Lois Huxley, PA in 2 weeks.  Follow-up with Dr. Antony Contras, Stroke Clinic in 6 weeks, office to schedule an appointment.  Follow up with Cardiology, Gastroenterology, Nephrology and Interventional  Radiology  Greater than 30 minutes were spent preparing discharge.  ATTENDING NOTE: I reviewed above note and agree with the assessment and plan. I have made any additions or clarifications directly to the above note. Pt was seen and examined.   52 year old female with history of hypertension, CKD, obesity admitted for aphasia and difficulty speaking. Status post TPA symptoms improved. Developed [place] upper GI bleeding. EGD negative. MRI showed left inferior MCA moderate-sized infarct. MRA largely unremarkable except right VA occlusion. LDL 129 and A1c 5.2, although ESR 67 but CRP normal.EF 60-65% carotid Doppler negative. TEE also negative without any PFO. Loop recorder placed. Patient stroke etiology unclear,hypercoagulable workup negative so far except homocysteine 26.5.  PatientBP has been difficult control even with multiple BP meds. She haslooks like baseline elevated creatinine, with anemia, but stable. Nephrology consulted, on Lasix bid, norvasc, coreg,hydralazineand clonidine.Renal artery [place] negative and pending malignant HTN labs. PT OT recommend home PT/OT. pt is ready for d/c home today. Follow up with PCP and nephrology closely.    Rosalin Hawking, MD PhD Stroke Neurology 04/23/2017 4:26 PM

## 2017-04-23 NOTE — Progress Notes (Signed)
Renal artery duplex prelim: right 1-59% RAS, left no evidence of RAS. Landry Mellow, RDMS, RVT

## 2017-04-23 NOTE — Progress Notes (Signed)
Pt taken down via wheelchair by volunteer services with all of belongings.

## 2017-04-23 NOTE — Care Management Note (Signed)
Case Management Note  Patient Details  Name: Erin Dean MRN: [id number] Date of Birth: 05/21/1965  Subjective/Objective:                    Action/Plan: Pt discharging home with self care. Pt states her daughter can provide supervision and support at home.  CM consulted for outpatient therapy. CM met with the patient and she is interested in going to [facility]. Orders in Epic and information on the AVS.  Pt has transportation home.   Expected Discharge Date:  04/23/17               Expected Discharge Plan:  OP Rehab  In-House Referral:     Discharge planning Services  CM Consult  Post Acute Care Choice:    Choice offered to:     DME Arranged:    DME Agency:     HH Arranged:    HH Agency:     Status of Service:  Completed, signed off  If discussed at [employer] of Stay Meetings, dates discussed:    Additional Comments:  Pollie Friar, RN 04/23/2017, 12:05 PM

## 2017-04-23 NOTE — Progress Notes (Signed)
Pt given discharge information. IV taken out and tele taken off. Pt is gathering all of her belongings and waiting for her daughter to pick her up.

## 2017-04-24 NOTE — ED Provider Notes (Signed)
[facility] 3W PROGRESSIVE CARE Provider Note   CSN: [id number] Arrival date & time: 04/17/17  1607     History   Chief Complaint No chief complaint on file.   HPI Erin Dean is a 52 y.o. female.  HPI   52 year old female with aphasia.  Last known normal at 5:30 AM while at work.  No other acute complaints.  Denies any pain but does feel short of breath.  She was made a code stroke.  Past Medical History:  Diagnosis Date  . Hypertension   . Stroke ([place])   . SVT (supraventricular tachycardia) [facility])     Patient Active Problem List   Diagnosis Date Noted  . Cryptogenic stroke ([place])   . Hypertensive emergency   . Demand ischemia ([place])   . Stroke (cerebrum) ([place]) 04/17/2017  . Essential hypertension 12/01/2015  . Morbid obesity ([place]) 12/01/2015  . Lower extremity edema 12/01/2015  . Heart murmur 12/01/2015  . Hypertensive urgency 09/22/2012  . Anxiety 09/22/2012    Past Surgical History:  Procedure Laterality Date  . CESAREAN SECTION    . ESOPHAGOGASTRODUODENOSCOPY N/A 04/19/2017   Procedure: ESOPHAGOGASTRODUODENOSCOPY (EGD);  Surgeon: Carol Ada, MD;  Location: [facility];  Service: Endoscopy;  Laterality: N/A;  . LOOP RECORDER INSERTION N/A 04/22/2017   Procedure: LOOP RECORDER INSERTION;  Surgeon: Constance Haw, MD;  Location: [facility] CV LAB;  Service: Cardiovascular;  Laterality: N/A;  . TEE WITHOUT CARDIOVERSION N/A 04/19/2017   Procedure: TRANSESOPHAGEAL ECHOCARDIOGRAM (TEE);  Surgeon: Carol Ada, MD;  Location: [facility];  Service: Endoscopy;  Laterality: N/A;    OB History    No data available       Home Medications    Prior to Admission medications   Medication Sig Start Date End Date Taking? Authorizing Provider  escitalopram (LEXAPRO) 10 MG tablet Take 10 mg by mouth daily.   Yes [provider]  ALPRAZolam (XANAX) 0.5 MG tablet Take 0.25-0.5 mg by mouth 3 (three) times daily as needed (anxiety). Pt has not taken  yet    [provider]  amLODipine (NORVASC) 10 MG tablet Take 1 tablet (10 mg total) by mouth daily. 04/23/17 07/22/17  Mary Sella, NP  aspirin EC 325 MG EC tablet Take 1 tablet (325 mg total) by mouth daily. 04/24/17   Mary Sella, NP  atorvastatin (LIPITOR) 40 MG tablet Take 1 tablet (40 mg total) by mouth daily at 6 PM. 04/23/17   Mary Sella, NP  carvedilol (COREG) 12.5 MG tablet Take 1 tablet (12.5 mg total) by mouth 2 (two) times daily. 04/23/17   Mary Sella, NP  cloNIDine (CATAPRES) 0.1 MG tablet Take 1 tablet (0.1 mg total) by mouth 3 (three) times daily. 04/23/17   Mary Sella, NP  ferrous sulfate 325 (65 FE) MG tablet Take 1 tablet (325 mg total) by mouth 3 (three) times daily with meals. 04/23/17   Mary Sella, NP  furosemide (LASIX) 80 MG tablet Take 1 tablet (80 mg total) by mouth daily. 04/23/17   Mary Sella, NP  hydrALAZINE (APRESOLINE) 100 MG tablet Take 1 tablet (100 mg total) by mouth 3 (three) times daily. 04/23/17   Mary Sella, NP  HYDROcodone-acetaminophen (NORCO/VICODIN) 5-325 MG tablet Take 2 tablets by mouth every 4 (four) hours as needed. 08/04/16   Fransico Meadow, PA-C  pantoprazole (PROTONIX) 40 MG tablet Take 1 tablet (40 mg total) by mouth daily. 04/24/17   Mary Sella, NP  vitamin B-12 1000 MCG tablet Take 1 tablet (1,000 mcg total) by mouth daily. 04/24/17   Mary Sella, NP    Family History Family History  Problem Relation Age of Onset  . Heart attack Maternal Uncle   . Heart attack Maternal Grandfather   . Stroke Paternal Grandmother     Social History Social History   Tobacco Use  . Smoking status: Never Smoker  . Smokeless tobacco: Never Used  Substance Use Topics  . Alcohol use: No  . Drug use: No     Allergies   Patient has no known allergies.   Review of Systems Review of Systems Level 5 caveat because of expressive aphasia  Physical Exam Updated Vital Signs BP (!) 166/78 (BP  Location: Right Arm)   Pulse (!) 55   Temp 98.1 F (36.7 C) (Oral)   Resp 18   Ht 5\' 7"  (1.702 m)   Wt 117 kg (257 lb 15 oz)   LMP  (LMP Unknown)   SpO2 92%   BMI 40.40 kg/m   Physical Exam  Constitutional: She appears well-developed and well-nourished. No distress.  HENT:  Head: Normocephalic and atraumatic.  Eyes: Conjunctivae are normal. Right eye exhibits no discharge. Left eye exhibits no discharge.  Neck: Neck supple.  Cardiovascular: Normal rate, regular rhythm and normal heart sounds. Exam reveals no gallop and no friction rub.  No murmur heard. Pulmonary/Chest: Effort normal and breath sounds normal. No respiratory distress.  Abdominal: Soft. She exhibits no distension. There is no tenderness.  Musculoskeletal: She exhibits no edema or tenderness.  Neurological: She is alert. No cranial nerve deficit or sensory deficit. She exhibits normal muscle tone. Coordination normal.  Expressive aphasia.  Able to nod head "yes/no"  Skin: Skin is warm and dry.  Psychiatric: She has a normal mood and affect. Her behavior is normal. Thought content normal.  Nursing note and vitals reviewed.    ED Treatments / Results  Labs (all labs ordered are listed, but only abnormal results are displayed) Labs Reviewed  CBC - Abnormal; Notable for the following components:      Result Value   RBC 3.63 (*)    Hemoglobin 9.3 (*)    HCT 30.7 (*)    MCH 25.6 (*)    RDW 15.9 (*)    All other components within normal limits  COMPREHENSIVE METABOLIC PANEL - Abnormal; Notable for the following components:   CO2 19 (*)    Glucose, Bld 108 (*)    BUN 40 (*)    Creatinine, Ser 3.22 (*)    Calcium 8.8 (*)    Total Protein 8.2 (*)    Albumin 3.4 (*)    ALT 9 (*)    GFR calc non Af Amer 15 (*)    GFR calc Af Amer 18 (*)    All other components within normal limits  BRAIN NATRIURETIC PEPTIDE - Abnormal; Notable for the following components:   B Natriuretic Peptide 1,262.2 (*)    All other  components within normal limits  OCCULT BLOOD GASTRIC / DUODENUM (SPECIMEN CUP) - Abnormal; Notable for the following components:   Occult Blood, Gastric POSITIVE (*)    All other components within normal limits  HEMOGLOBIN AND HEMATOCRIT, BLOOD - Abnormal; Notable for the following components:   Hemoglobin 9.5 (*)    HCT 31.1 (*)    All other components within normal limits  TROPONIN I - Abnormal; Notable for the following components:   Troponin I 0.29 (*)  All other components within normal limits  TROPONIN I - Abnormal; Notable for the following components:   Troponin I 0.27 (*)    All other components within normal limits  CBC - Abnormal; Notable for the following components:   WBC 11.5 (*)    RBC 3.32 (*)    Hemoglobin 8.5 (*)    HCT 28.5 (*)    MCH 25.6 (*)    MCHC 29.8 (*)    RDW 16.0 (*)    All other components within normal limits  LIPID PANEL - Abnormal; Notable for the following components:   LDL Cholesterol 129 (*)    All other components within normal limits  HEMOGLOBIN AND HEMATOCRIT, BLOOD - Abnormal; Notable for the following components:   Hemoglobin 8.0 (*)    HCT 27.3 (*)    All other components within normal limits  HEMOGLOBIN AND HEMATOCRIT, BLOOD - Abnormal; Notable for the following components:   Hemoglobin 8.9 (*)    HCT 29.8 (*)    All other components within normal limits  HEMOGLOBIN AND HEMATOCRIT, BLOOD - Abnormal; Notable for the following components:   Hemoglobin 8.1 (*)    HCT 26.5 (*)    All other components within normal limits  HEMOGLOBIN AND HEMATOCRIT, BLOOD - Abnormal; Notable for the following components:   Hemoglobin 8.5 (*)    HCT 28.6 (*)    All other components within normal limits  BASIC METABOLIC PANEL - Abnormal; Notable for the following components:   BUN 38 (*)    Creatinine, Ser 3.65 (*)    GFR calc non Af Amer 13 (*)    GFR calc Af Amer 15 (*)    All other components within normal limits  CBC - Abnormal; Notable for the  following components:   RBC 3.36 (*)    Hemoglobin 8.6 (*)    HCT 29.1 (*)    MCH 25.6 (*)    MCHC 29.6 (*)    RDW 16.1 (*)    All other components within normal limits  PROTIME-INR - Abnormal; Notable for the following components:   Prothrombin Time 15.4 (*)    All other components within normal limits  HEMOGLOBIN AND HEMATOCRIT, BLOOD - Abnormal; Notable for the following components:   Hemoglobin 8.0 (*)    HCT 26.7 (*)    All other components within normal limits  HOMOCYSTEINE - Abnormal; Notable for the following components:   Homocysteine 26.5 (*)    All other components within normal limits  SEDIMENTATION RATE - Abnormal; Notable for the following components:   Sed Rate 67 (*)    All other components within normal limits  RENAL FUNCTION PANEL - Abnormal; Notable for the following components:   CO2 21 (*)    BUN 35 (*)    Creatinine, Ser 3.60 (*)    Calcium 8.1 (*)    Phosphorus 4.7 (*)    Albumin 2.9 (*)    GFR calc non Af Amer 14 (*)    GFR calc Af Amer 16 (*)    All other components within normal limits  IRON AND TIBC - Abnormal; Notable for the following components:   Saturation Ratios 9 (*)    All other components within normal limits  RETICULOCYTES - Abnormal; Notable for the following components:   RBC. 3.19 (*)    All other components within normal limits  URINALYSIS, ROUTINE W REFLEX MICROSCOPIC - Abnormal; Notable for the following components:   Glucose, UA 50 (*)    Protein, ur  100 (*)    Bacteria, UA RARE (*)    Squamous Epithelial / LPF 0-5 (*)    All other components within normal limits  RENAL FUNCTION PANEL - Abnormal; Notable for the following components:   BUN 33 (*)    Creatinine, Ser 3.54 (*)    Calcium 8.3 (*)    Albumin 2.7 (*)    GFR calc non Af Amer 14 (*)    GFR calc Af Amer 16 (*)    All other components within normal limits  RENAL FUNCTION PANEL - Abnormal; Notable for the following components:   CO2 19 (*)    BUN 31 (*)     Creatinine, Ser 3.41 (*)    Albumin 3.1 (*)    GFR calc non Af Amer 14 (*)    GFR calc Af Amer 17 (*)    All other components within normal limits  CBC - Abnormal; Notable for the following components:   RBC 3.48 (*)    Hemoglobin 9.2 (*)    HCT 30.1 (*)    RDW 16.7 (*)    All other components within normal limits  RENAL FUNCTION PANEL - Abnormal; Notable for the following components:   BUN 34 (*)    Creatinine, Ser 3.64 (*)    Albumin 3.0 (*)    GFR calc non Af Amer 13 (*)    GFR calc Af Amer 15 (*)    All other components within normal limits  CBC - Abnormal; Notable for the following components:   RBC 3.33 (*)    Hemoglobin 8.6 (*)    HCT 28.1 (*)    MCH 25.8 (*)    RDW 16.2 (*)    All other components within normal limits  I-STAT TROPONIN, ED - Abnormal; Notable for the following components:   Troponin i, poc 0.22 (*)    All other components within normal limits  CBG MONITORING, ED - Abnormal; Notable for the following components:   Glucose-Capillary 111 (*)    All other components within normal limits  I-STAT CHEM 8, ED - Abnormal; Notable for the following components:   BUN 36 (*)    Creatinine, Ser 3.40 (*)    Glucose, Bld 107 (*)    Calcium, Ion 1.09 (*)    TCO2 19 (*)    Hemoglobin 10.5 (*)    HCT 31.0 (*)    All other components within normal limits  MRSA PCR SCREENING  PROTIME-INR  APTT  DIFFERENTIAL  HIV ANTIBODY (ROUTINE TESTING)  MAGNESIUM  HEMOGLOBIN A1C  MAGNESIUM  PHOSPHORUS  TSH  ANTINUCLEAR ANTIBODIES, IFA  LUPUS ANTICOAGULANT PANEL  BETA-2-GLYCOPROTEIN I ABS, IGG/M/A  CARDIOLIPIN ANTIBODIES, IGG, IGM, IGA  C-REACTIVE PROTEIN  SICKLE CELL SCREEN  RPR  MAGNESIUM  VITAMIN B12  FOLATE  FERRITIN  CALCIUM, IONIZED  ALPHA GALACTOSIDASE  METANEPHRINES, PLASMA  ALDOSTERONE + RENIN ACTIVITY W/ RATIO  CATECHOLAMINES, FRACTIONATED, PLASMA  I-STAT BETA HCG BLOOD, ED ([facility], [facility], [facility] ONLY)    EKG  EKG Interpretation  Date/Time:  Wednesday  April 17 2017 11:17:10 EST Ventricular Rate:  86 PR Interval:    QRS Duration: 103 QT Interval:  371 QTC Calculation: 444 R Axis:   57 Text Interpretation:  Sinus rhythm Prolonged PR interval Probable left atrial enlargement Left ventricular hypertrophy Nonspecific T abnormalities, lateral leads When compared with ECG of EARLIER SAME DATE No significant change was found Confirmed by Delora Fuel ([id number]) on 04/17/2017 10:53:17 PM       Radiology No  results found.  Procedures Procedures (including critical care time)  CRITICAL CARE Performed by: Virgel Manifold Total critical care time: 35 minutes Critical care time was exclusive of separately billable procedures and treating other patients. Critical care was necessary to treat or prevent imminent or life-threatening deterioration. Critical care was time spent personally by me on the following activities: development of treatment plan with patient and/or surrogate as well as nursing, discussions with consultants, evaluation of patient's response to treatment, examination of patient, obtaining history from patient or surrogate, ordering and performing treatments and interventions, ordering and review of laboratory studies, ordering and review of radiographic studies, pulse oximetry and re-evaluation of patient's condition.   Medications Ordered in ED Medications  alteplase (ACTIVASE) 1 mg/mL infusion 90 mg (0 mg Intravenous Stopped 04/17/17 1055)    Followed by  0.9 %  sodium chloride infusion (0 mLs Intravenous Stopped 04/17/17 1118)  iopamidol (ISOVUE-370) 76 % injection (  Contrast Given 04/17/17 1000)  furosemide (LASIX) injection 40 mg (40 mg Intravenous Given 04/17/17 1017)  clevidipine (CLEVIPREX) 0.5 MG/ML infusion (  Stopped 04/17/17 1525)  fentaNYL (SUBLIMAZE) injection 50 mcg (50 mcg Intravenous Given 04/17/17 1124)  fentaNYL (SUBLIMAZE) injection 50 mcg (50 mcg Intravenous Given 04/17/17 1205)  pantoprazole (PROTONIX) 80 mg in sodium  chloride 0.9 % 100 mL IVPB (0 mg Intravenous Stopped 04/17/17 1521)  clevidipine (CLEVIPREX) 0.5 MG/ML infusion (  Stopped 04/17/17 1350)  LORazepam (ATIVAN) injection 1-2 mg (2 mg Intravenous Given 04/18/17 1048)  calcium gluconate 1 g in sodium chloride 0.9 % 100 mL IVPB (0 g Intravenous Stopped 04/18/17 1453)     Initial Impression / Assessment and Plan / ED Course  I have reviewed the triage vital signs and the nursing notes.  Pertinent labs & imaging results that were available during my care of the patient were reviewed by me and considered in my medical decision making (see chart for details).  Clinical Course as of Apr 24 817  Wed Apr 17, 2017  1048 Went by room. Pt still at MRI. Several family members in room. Updated on little information I have at this point. Apparently some crackles heard on exam while back in imaging. Awaiting to reassess her on return to ED. Troponin is elevated. I haven't seen an EKG at this point. May not have been done yet since went straight to imaging. ECHO from 9/17 with EF 55% and grade 2 diastolic dysfunction.   [SK]    Clinical Course User Index [SK] Virgel Manifold, MD    52 year old female with a CVA.  Severe expressive aphasia.  She was made a "code stroke" and evaluated by neurology.  Decision made to give TPA.  Patient unable to tolerate MRI secondary to dyspnea.  Clinically heart failure.  She sounds wet on exam.  Chest x-ray with some edema.  Some blood pressure control with Cardene.  Lasix.  Symptoms markedly improving while in the emergency room but questionably developing some hematemesis.  Protonix.  Admission. Final Clinical Impressions(s) / ED Diagnoses   Final diagnoses:  Cerebrovascular accident (CVA), unspecified mechanism ([place])  CHF (congestive heart failure) ([place])  CKD (chronic kidney disease)  Other iron deficiency anemia    ED Discharge Orders        Ordered    aspirin EC 325 MG EC tablet  Daily     04/23/17 1137    pantoprazole  (PROTONIX) 40 MG tablet  Daily     04/23/17 1137    vitamin B-12 1000 MCG tablet  Daily     04/23/17 1137    amLODipine (NORVASC) 10 MG tablet  Daily     04/23/17 1137    atorvastatin (LIPITOR) 40 MG tablet  Daily-1800     04/23/17 1137    carvedilol (COREG) 12.5 MG tablet  2 times daily     04/23/17 1137    cloNIDine (CATAPRES) 0.1 MG tablet  3 times daily     04/23/17 1137    ferrous sulfate 325 (65 FE) MG tablet  3 times daily with meals     04/23/17 1137    furosemide (LASIX) 80 MG tablet  Daily     04/23/17 1137    hydrALAZINE (APRESOLINE) 100 MG tablet  3 times daily     04/23/17 1137    Ambulatory referral to Occupational Therapy     04/23/17 1147    Ambulatory referral to Cardiac Electrophysiology    Comments:  Pt needs loop implant for cryptogenic stroke.   04/21/17 1541    Ambulatory referral to Neurology    Comments:  An appointment is requested in approximately:6 weeks Follow up with stroke clinic (Dr Leonie Man  preferred, if not available, then consider Caesar Chestnut, [facility] or Jaynee Eagles whoever is available) at [facility] in about 6-8 weeks. Thanks.   04/18/17 1924       Virgel Manifold, MD 04/24/17 [phone]

## 2017-04-25 ENCOUNTER — Other Ambulatory Visit: Payer: Self-pay

## 2017-04-25 LAB — CATECHOLAMINES, FRACTIONATED, PLASMA
EPINEPHRINE: 43 pg/mL (ref 0–62)
Norepinephrine: 350 pg/mL (ref 0–874)

## 2017-04-25 NOTE — Patient Outreach (Signed)
[facility]) Care Management  04/25/2017  ALABAMA DOIG 08/22/1965 [id number]   EMMI: stroke  Referral date: 04/25/17 Referral source: EMMI stroke red alert Referral reason: Scheduled a follow up appointment: no Day #1  Telephone call to patient regarding EMMI stroke red alert. HIPAA verified with patient. Discussed EMMI stroke program with patient. Patient states she has an appointment scheduled with her primary provider today and an appointment scheduled with the neurologist on 06/18/17.  Patient denies having any new symptoms or concerns.  Patient reports having all of her medications and taking them as prescribed. Patient states she is taking a blood thinner.  RNCM discussed signs of bleeding and advised patient to contact her doctor if these signs are noticed.  Patient states she has a loop recorder.  RNCM discussed signs/ symptoms of infection with patient and advised her to follow up with doctor for these symptoms.  Discussed with patient signs/ symptoms of stroke. Advised patient to call 911 for stroke like symptoms.  Patient states she has good family support from her daughter and has transportation assistance. ' Patient states she would like to have an Advance directive packet.  Patient denies any further questions or need for follow up.   PLAN: RNCM will refer patient to care management assistant to close patient due to patient being assessed and having no further needs.  RNCM will send patient Advance directive packet as discussed.   Quinn Plowman RN,BSN,CCM [facility] Telephonic  [phone]

## 2017-04-26 LAB — METANEPHRINES, PLASMA
METANEPHRINE FREE: 30 pg/mL (ref 0–62)
NORMETANEPHRINE FREE: 90 pg/mL (ref 0–145)

## 2017-04-26 LAB — ALDOSTERONE + RENIN ACTIVITY W/ RATIO
ALDO / PRA Ratio: >82.6 — ABNORMAL HIGH (ref 0.0–30.0)
ALDOSTERONE: 13.8 ng/dL (ref 0.0–30.0)
PRA LC/MS/MS: <0.167 ng/mL/hr — ABNORMAL LOW (ref 0.167–5.380)

## 2017-04-30 ENCOUNTER — Telehealth: Payer: Self-pay | Admitting: Neurology

## 2017-04-30 NOTE — Telephone Encounter (Signed)
-----   Message from Marval Regal, RN sent at 04/30/2017  5:47 PM EST ----- I call patient about labs being sent. Pt was inquiring about FMLA paperwork. She stated the hospital did not discuss work or if she could return. Can you call her listed number asap. Pt is not back at work. Her first appt is March with Dr. Leta Baptist.

## 2017-04-30 NOTE — Telephone Encounter (Signed)
Called pt and she had her daughter on the phone with me too.   She is asking for filling out the FMLA form. I told her to drop by our office and leave the form to [place] to fill out. It cost $25 dollars. She is fine with that. She is asking for 05/18/17 to go back to work and I think it is very reasonable as she is doing more physical work at her place. I would suggest part time 6h per day for the first 2 weeks and then 12h full time schedule after. She is fine with that too.   I told her that I will mail her recent lab result to her home and she should bring this to her appointment with PCP and nephrology. She expressed understanding.   She has not heard anything from nephrology office yet. I gave her the [place] [facility] number for her to call and schedule appointment with Dr. Jonnie Finner ASAP. She and daughter expressed understanding and appreciation.   Rosalin Hawking, MD PhD Stroke Neurology 04/30/2017 6:29 PM

## 2017-05-01 NOTE — Telephone Encounter (Signed)
Correction the office policy is $22.41 dollars for any FMLA, disability forms at [place].

## 2017-05-02 ENCOUNTER — Ambulatory Visit (INDEPENDENT_AMBULATORY_CARE_PROVIDER_SITE_OTHER): Payer: Self-pay | Admitting: *Deleted

## 2017-05-02 DIAGNOSIS — I639 Cerebral infarction, unspecified: Secondary | ICD-10-CM

## 2017-05-02 LAB — CUP PACEART INCLINIC DEVICE CHECK
Date Time Interrogation Session: 20190124144303
MDC IDC PG IMPLANT DT: 20190114

## 2017-05-02 LAB — ALPHA GALACTOSIDASE: Alpha galactosidase, serum: 38.4 nmol/hr/mg prt (ref 28.0–80.0)

## 2017-05-02 NOTE — Progress Notes (Signed)
Wound check appointment. Steri-strips removed prior to appointment. Wound without redness or edema. Incision edges approximated, wound well healed. Battery status: Good. R-waves 0.51 mV. 0 symptom episodes, 0 tachy episodes, 0 pause episodes, 0 brady episodes. 0 AF episodes (0% burden). Monthly summary reports and ROV with with WC PRN.

## 2017-05-07 ENCOUNTER — Other Ambulatory Visit: Payer: Self-pay | Admitting: *Deleted

## 2017-05-07 NOTE — Patient Outreach (Signed)
[facility]) Care Management  05/07/2017  Erin Dean December 30, 1965 [id number]  EMMI- Stroke RED ON EMMI ALERT DAY#: 13 DATE: 05/06/17 RED ALERT: Martin Majestic to follow-up appointment? No   Outreach attempt # 1 to patient. HIPAA identifiers verified X's 2. Patient has an appointment scheduled with cardiology on 05/08/17. She had a hospital follow-up appointment with her primary MD, per patient. Patient stated, she was asleep prior to RN CM telephone call. She reported, some of the medications caused drowsiness. Then she stated, she may have just fallen asleep. Patient discussed having notable swelling in her feet after being discharged from the hospital. She plans to contact her primary MD about her swelling. RN CM encouraged patient to talk with the Cardiologist about the swelling during the office visit, also. Patient stated, she adheres to baked foods and low sodium meals. She stated, her B/P has decreased from the 200's to 140's -160's. RN CM discussed with patient about normal and borderline blood pressure ranges. RN CM encouraged patient to talk with Cardiologist about a target blood pressure for her and she agreed. Patient stated her goals for her health is to be healthier in 2019. She plans to reduce her weight by exercising by walking more. Patient stated, she doesn't drink or smoke. RN CM and patient discussed signs and symptoms of a stroke. Patient reported, she lost her speech, had spots in her eyes, and she developed a headache in her left eye.     Plan: RN CM will contact patient within one week. RN CM will send patient EMMI educational materials.  RN CM advised patient to alert MD for any changes in conditions.     Erin Bells, RN, BSN, MHA/MSL, [place] Telephonic Care Manager Coordinator [facility] Direct Phone: [phone] Toll Free: [phone] Fax: [phone]

## 2017-05-08 ENCOUNTER — Encounter: Payer: Self-pay | Admitting: Cardiovascular Disease

## 2017-05-08 ENCOUNTER — Ambulatory Visit: Payer: BLUE CROSS/BLUE SHIELD | Admitting: Cardiovascular Disease

## 2017-05-08 DIAGNOSIS — I10 Essential (primary) hypertension: Secondary | ICD-10-CM

## 2017-05-08 DIAGNOSIS — R600 Localized edema: Secondary | ICD-10-CM

## 2017-05-08 NOTE — Progress Notes (Signed)
05/08/2017 Erin Dean   May 17, 1965  [id number]  Primary Physician Lois Huxley, PA Primary Cardiologist: Lorretta Harp MD Garret Reddish, [place], [place]  HPI:  Erin Dean is a 52 y.o. moderately overweight divorced African-American female mother of 2 children, grandmother of 2 grandchildren is accompanied by her daughter Erin Dean today. She was referred by the ER for ongoing outpatient care. She has previously seen Dr. Irish Lack in the office 2 years ago for hypertension and has seen Drs. [place], [place] and [place] during her recent hospitalization.she has a history of treated hypertension and hyperlipidemia. There is no family history. She does admit to medication noncompliance which led to her hypertensive urgency and stroke recently although now she says she's taking her medications religiously.we have talked about the importance of salt restriction. She had a TEE to rule out embolic source was negative. A loop recorder was implanted by Dr. Curt Bears to rule out PAF as a cause of cryptogenic stroke.     Current Meds  Medication Sig  . ALPRAZolam (XANAX) 0.5 MG tablet Take 0.25-0.5 mg by mouth 3 (three) times daily as needed (anxiety). Pt has not taken yet  . amLODipine (NORVASC) 10 MG tablet Take 1 tablet (10 mg total) by mouth daily.  Marland Kitchen aspirin EC 325 MG EC tablet Take 1 tablet (325 mg total) by mouth daily.  Marland Kitchen atorvastatin (LIPITOR) 40 MG tablet Take 1 tablet (40 mg total) by mouth daily at 6 PM.  . carvedilol (COREG) 12.5 MG tablet Take 1 tablet (12.5 mg total) by mouth 2 (two) times daily.  . cloNIDine (CATAPRES) 0.1 MG tablet Take 1 tablet (0.1 mg total) by mouth 3 (three) times daily.  Marland Kitchen escitalopram (LEXAPRO) 10 MG tablet Take 10 mg by mouth daily.  . ferrous sulfate 325 (65 FE) MG tablet Take 1 tablet (325 mg total) by mouth 3 (three) times daily with meals.  . furosemide (LASIX) 80 MG tablet Take 1 tablet (80 mg total) by mouth daily.  . hydrALAZINE (APRESOLINE) 100  MG tablet Take 1 tablet (100 mg total) by mouth 3 (three) times daily.  Marland Kitchen HYDROcodone-acetaminophen (NORCO/VICODIN) 5-325 MG tablet Take 2 tablets by mouth every 4 (four) hours as needed.  . pantoprazole (PROTONIX) 40 MG tablet Take 1 tablet (40 mg total) by mouth daily.  . vitamin B-12 1000 MCG tablet Take 1 tablet (1,000 mcg total) by mouth daily.     No Known Allergies  Social History   Socioeconomic History  . Marital status: Legally Separated    Spouse name: Not on file  . Number of children: Not on file  . Years of education: Not on file  . Highest education level: Not on file  Social Needs  . Financial resource strain: Not on file  . Food insecurity - worry: Not on file  . Food insecurity - inability: Not on file  . Transportation needs - medical: Not on file  . Transportation needs - non-medical: Not on file  Occupational History  . Not on file  Tobacco Use  . Smoking status: Never Smoker  . Smokeless tobacco: Never Used  Substance and Sexual Activity  . Alcohol use: No  . Drug use: No  . Sexual activity: Not on file  Other Topics Concern  . Not on file  Social History Narrative  . Not on file     Review of Systems: General: negative for chills, fever, night sweats or weight changes.  Cardiovascular: negative for chest pain, dyspnea  on exertion, edema, orthopnea, palpitations, paroxysmal nocturnal dyspnea or shortness of breath Dermatological: negative for rash Respiratory: negative for cough or wheezing Urologic: negative for hematuria Abdominal: negative for nausea, vomiting, diarrhea, bright red blood per rectum, melena, or hematemesis Neurologic: negative for visual changes, syncope, or dizziness All other systems reviewed and are otherwise negative except as noted above.    Blood pressure (!) 144/74, pulse 72, height 5\' 7"  (1.702 m), weight 261 lb (118.4 kg).  General appearance: alert and no distress Neck: no adenopathy, no carotid bruit, no JVD,  supple, symmetrical, trachea midline and thyroid not enlarged, symmetric, no tenderness/mass/nodules Lungs: clear to auscultation bilaterally Heart: regular rate and rhythm, S1, S2 normal, no murmur, click, rub or gallop Extremities: 2-3+ pitting bilateral lower extremity edema. Pulses: 2+ and symmetric Skin: Skin color, texture, turgor normal. No rashes or lesions Neurologic: Alert and oriented X 3, normal strength and tone. Normal symmetric reflexes. Normal coordination and gait  EKG not performed today  ASSESSMENT AND PLAN:   Essential hypertension History of essential hypertension with poor medication compliance recently admitted with hypertensive urgency and stroke. Her initial blood pressure was 240/170. She was placed on intravenous antihypertensive pharmacology and treated with IV TPA. She does have chronic renal insufficiency. Her blood pressure today is 145/72. She is on amlodipine, hydralazine and carvedilol as well as clonidine. Continue current meds at current dosing.  Lower extremity edema 2-3+ pitting bilateral lower extremity edema on high high-dose furosemide. She has chronic renal insufficiency. I suspect this is multifactorial from dietary indiscretion as well as amlodipine although we don't have other good options. She is scheduled to see a nephrologist in the near future to discuss her chronic renal insufficiency.  Stroke (cerebrum) ([place]) Recent stroke probably related to hypertensive urgency from dietary and medication noncompliance. A TEE showed no embolic source. A loop recorder was implanted by Dr. Curt Bears to rule out PAF as a cause of cryptogenic stroke and he is following this.      Lorretta Harp MD FACP,FACC,FAHA, [facility] 05/08/2017 2:35 PM

## 2017-05-08 NOTE — Assessment & Plan Note (Signed)
2-3+ pitting bilateral lower extremity edema on high high-dose furosemide. She has chronic renal insufficiency. I suspect this is multifactorial from dietary indiscretion as well as amlodipine although we don't have other good options. She is scheduled to see a nephrologist in the near future to discuss her chronic renal insufficiency.

## 2017-05-08 NOTE — Assessment & Plan Note (Signed)
History of essential hypertension with poor medication compliance recently admitted with hypertensive urgency and stroke. Her initial blood pressure was 240/170. She was placed on intravenous antihypertensive pharmacology and treated with IV TPA. She does have chronic renal insufficiency. Her blood pressure today is 145/72. She is on amlodipine, hydralazine and carvedilol as well as clonidine. Continue current meds at current dosing.

## 2017-05-08 NOTE — Patient Instructions (Signed)

## 2017-05-08 NOTE — Assessment & Plan Note (Signed)
Recent stroke probably related to hypertensive urgency from dietary and medication noncompliance. A TEE showed no embolic source. A loop recorder was implanted by Dr. Curt Bears to rule out PAF as a cause of cryptogenic stroke and he is following this.

## 2017-05-13 ENCOUNTER — Telehealth: Payer: Self-pay

## 2017-05-13 ENCOUNTER — Ambulatory Visit: Payer: BLUE CROSS/BLUE SHIELD | Attending: Neurology | Admitting: Occupational Therapy

## 2017-05-13 ENCOUNTER — Encounter: Payer: Self-pay | Admitting: Occupational Therapy

## 2017-05-13 DIAGNOSIS — R278 Other lack of coordination: Secondary | ICD-10-CM

## 2017-05-13 DIAGNOSIS — Z0289 Encounter for other administrative examinations: Secondary | ICD-10-CM

## 2017-05-13 NOTE — Telephone Encounter (Signed)
FMLA forms paid for 50.00 dollars. Form was done by Dr. Erlinda Hong. PT can return to work after 05/17/2017 full time with no work restrictions. Dr. Erlinda Hong read,and sign the job duties listed for patient. He wrote on form pt can do all job duties. Sent to [employer] records for faxing.

## 2017-05-13 NOTE — Therapy (Signed)
[facility] [address] [place], [place], [address] Phone: [phone]   Fax:  [phone]  Occupational Therapy Evaluation  Patient Details  Name: Erin Dean MRN: [id number] Date of Birth: [birth year] Referring Provider: Dr Erlinda Hong   Encounter Date: 05/13/2017  OT End of Session - 05/13/17 1443    Visit Number  1    Number of Visits  1    Date for OT Re-Evaluation  -- n/a    Authorization Type  bcbs    OT Start Time  6503 pt arrived late    OT Stop Time  1439    OT Time Calculation (min)  24 min    Activity Tolerance  Patient tolerated treatment well       Past Medical History:  Diagnosis Date  . Hypertension   . Stroke ([name])   . SVT (supraventricular tachycardia) ([facility])     Past Surgical History:  Procedure Laterality Date  . CESAREAN SECTION    . ESOPHAGOGASTRODUODENOSCOPY N/A 04/19/2017   Procedure: ESOPHAGOGASTRODUODENOSCOPY (EGD);  Surgeon: Carol Ada, MD;  Location: [facility];  Service: Endoscopy;  Laterality: N/A;  . LOOP RECORDER INSERTION N/A 04/22/2017   Procedure: LOOP RECORDER INSERTION;  Surgeon: Constance Haw, MD;  Location: [facility];  Service: Cardiovascular;  Laterality: N/A;  . TEE WITHOUT CARDIOVERSION N/A 04/19/2017   Procedure: TRANSESOPHAGEAL ECHOCARDIOGRAM (TEE);  Surgeon: Carol Ada, MD;  Location: [facility];  Service: Endoscopy;  Laterality: N/A;    There were no vitals filed for this visit.  Subjective Assessment - 05/13/17 1416    Patient is accompained by:  Family member [name]    Pertinent History  L MCA CVA    Patient Stated Goals  memory and speech to be better    Currently in Pain?  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)    Referring Provider  Dr Erlinda Hong    Onset Date/Surgical Date  04/17/17    Hand Dominance  Left    Prior Therapy  acute  PT, OT and ST      Precautions   Precautions  None      Restrictions   Weight Bearing Restrictions  No      Balance Screen   Has the patient fallen in the past 6 months  No      Home  Environment   Family/patient expects to be discharged to:  Private residence    [place] adult dtr    Available Help at Discharge  Available 24 hours/day    Type of Manor  One level    Bathroom Shower/Tub  Technical sales engineer    Additional Comments  pt states no equipment in the bathroom      Prior Function   Level of Independence  Independent    Vocation  Full time employment    Vocation Requirements  worked in [employer]    Leisure  likes doing crafts and home decorating      ADL   Eating/Feeding  Independent    Grooming  Independent    Engineer, manufacturing  Independent    Banker - Astronomer -  Forensic scientist      IADL   Shopping  Assistance for transportation    [place] alone or with occasional assistance    Meal Prep  Able to complete simple cold meal and snack prep    Community Mobility  Relies on family or friends for transportation    Medication Management  Has difficulty remembering to take medication dtr assists    Financial Management  Requires assistance      Mobility   Mobility Status  Independent      Written Expression   Dominant Hand  Left      Vision - History   Baseline Vision  Wears glasses all the time    Additional Comments  Pt denies any current changes.  During stroke pt had blurry vision and pain in her l eye however this has resolved per pt and dtr.       Vision Assessment   Eye Alignment  Within Functional Limits    Ocular Range of  Motion  Within Functional Limits    Tracking/Visual Pursuits  Able to track stimulus in all quads without difficulty    Saccades  Within functional limits    Convergence  Within functional limits      Activity Tolerance   Activity Tolerance  Tolerate 30+ min activity without fatigue      Cognition   Overall Cognitive Status  Impaired/Different from baseline    Mini Mental State Exam   Pt reports memory issues. Pt feels her language is impaired and her memory - will ask for ST eval      Sensation   Light Touch  Appears Intact    Hot/Cold  Appears Intact      Coordination   Gross Motor Movements are Fluid and Coordinated  Yes    Fine Motor Movements are Fluid and Coordinated  Yes    Finger Nose Finger Test  WFL's      Tone   Assessment Location  Right Upper Extremity;Left Upper Extremity      ROM / Strength   AROM / PROM / Strength  AROM;Strength      AROM   Overall AROM   Within functional limits for tasks performed    Overall AROM Comments  BUE's      Strength   Overall Strength  Within functional limits for tasks performed    Overall Strength Comments  BUE's      Hand Function   Right Hand Gross Grasp  Functional    Right Hand Grip (lbs)  60    Left Hand Gross Grasp  Functional dominant hand    Left Hand Grip (lbs)  68      RUE Tone   RUE Tone  Within Functional Limits      LUE Tone   LUE Tone  Within Functional Limits                           OT Long Term Goals - 05/13/17 1441      OT LONG TERM GOAL #1   Title  n/a            Plan - 05/13/17 1441    Clinical Impression Statement  Pt is a 52 year old female s/p L MCA  CVA on 04/17/2017. Pt was discharged home on 04/23/2017.  Pt presents with higher level memory deficits and some language deficits.  Have requested ST eval from neurologist. Pt presents with no OT needs at this time.     Occupational Profile and client history currently impacting functional performance  CHF, h/o of stroke,  HTN, anemia    Plan  no follow up OT indicated at this time.    Consulted and Agree with Plan of Care  Patient;Family member/caregiver    Family Member Consulted  dtr       Patient will benefit from skilled therapeutic intervention in order to improve the following deficits and impairments:     Visit Diagnosis: Other lack of coordination    Problem List Patient Active Problem List   Diagnosis Date Noted  . Cryptogenic stroke ([place])   . Hypertensive emergency   . Demand ischemia ([place])   . Stroke (cerebrum) ([place]) 04/17/2017  . Essential hypertension 12/01/2015  . Morbid obesity ([place]) 12/01/2015  . Lower extremity edema 12/01/2015  . Heart murmur 12/01/2015  . Hypertensive urgency 09/22/2012  . Anxiety 09/22/2012    Quay Burow, OTR/L 05/13/2017, 4:37 PM  [facility] [address] [place], [place], [address] Phone: [phone]   Fax:  [phone]  Name: LORALEE WEITZMAN MRN: [id number] Date of Birth: January 11, 1966

## 2017-05-14 ENCOUNTER — Other Ambulatory Visit: Payer: Self-pay | Admitting: Neurology

## 2017-05-14 ENCOUNTER — Other Ambulatory Visit: Payer: Self-pay | Admitting: Cardiovascular Disease

## 2017-05-14 ENCOUNTER — Other Ambulatory Visit: Payer: Self-pay

## 2017-05-14 ENCOUNTER — Telehealth: Payer: Self-pay | Admitting: Cardiology

## 2017-05-14 ENCOUNTER — Other Ambulatory Visit: Payer: Self-pay | Admitting: Interventional Cardiology

## 2017-05-14 ENCOUNTER — Telehealth: Payer: Self-pay | Admitting: Neurology

## 2017-05-14 DIAGNOSIS — I639 Cerebral infarction, unspecified: Secondary | ICD-10-CM

## 2017-05-14 DIAGNOSIS — R410 Disorientation, unspecified: Secondary | ICD-10-CM

## 2017-05-14 MED ORDER — AMLODIPINE BESYLATE 10 MG PO TABS: 10.0000 mg | ORAL_TABLET | Freq: Every day | ORAL | 3 refills | Status: DC

## 2017-05-14 NOTE — Telephone Encounter (Signed)
Spoke w/ pt and requested that she send a manual transmission b/c her home monitor has not updated in at least 14 days.   

## 2017-05-14 NOTE — Telephone Encounter (Signed)
REFILL 

## 2017-05-14 NOTE — Progress Notes (Addendum)
Order for speech sent to [facility].Order sent via computer signed by Dr.Sethi.

## 2017-05-14 NOTE — Telephone Encounter (Signed)
This is Dr. Berry's pt 

## 2017-05-14 NOTE — Telephone Encounter (Signed)
Fax sent to [employer] 05/14/17

## 2017-05-14 NOTE — Telephone Encounter (Signed)
error 

## 2017-05-15 ENCOUNTER — Telehealth: Payer: Self-pay | Admitting: *Deleted

## 2017-05-15 NOTE — Telephone Encounter (Signed)
Rn spoke with Hilda Blades in medical records. FMLA form was done and Dr .Erlinda Hong stated pt can return to work full time with no restrictions. Per January 22,2019 Dr Erlinda Hong note states spoke to patient can return to work 05/18/2017 part time, at 6 hours per day for two weeks.

## 2017-05-16 ENCOUNTER — Other Ambulatory Visit: Payer: Self-pay | Admitting: *Deleted

## 2017-05-16 ENCOUNTER — Ambulatory Visit: Payer: Self-pay | Admitting: *Deleted

## 2017-05-16 ENCOUNTER — Telehealth: Payer: Self-pay | Admitting: Cardiovascular Disease

## 2017-05-16 NOTE — Telephone Encounter (Signed)
Spoke with patient and she needs a letter to be released to go back to work. Any restrictions? Will forward to Dr Gwenlyn Found for review

## 2017-05-16 NOTE — Patient Outreach (Signed)
[facility]) Care Management  05/16/2017  Erin Dean [birth year] [id number]  EMMI Follow-Up  EMMI- Stroke RED ON EMMI ALERT DAY#: 13 DATE: 05/06/17 RED ALERT: Martin Majestic to follow-up appointment? No  Outgoing telephone call to patient. HIPAA identifiers verified with patient. Patient is planning to return to work. She stated, she forgot to ask for a return to work note during her Cardiology visit. She will be contacting the MD's office to obtain a work note. Patient reported, her last B/P was 147/74 for today. Per patient, her targeted B/P is 140/70. She feels her B/P has improved a significant amount compared to her pre-hospitalization B/P. Patient confirmed taking her medications as prescribed. She voiced losing 3 pounds. Patient stated, she was evaluated by OT and cleared on the same day. She stated, her strength has returned. RN CM and patient discussed signs and symptoms of a stroke.  Plan: RN CM advised patient to contact RN CM for any needs or concerns. RN CM advised patient to alert MD for any changes in conditions.  RN CM will notify [facility] Case Management Assistant regarding case closure.   Lake Bells, RN, BSN, MHA/MSL, [place] Telephonic Care Manager Coordinator [facility] Direct Phone: [phone] Cell Phone: [phone] Toll Free: [phone] Fax: [phone]

## 2017-05-16 NOTE — Telephone Encounter (Signed)
Erin Dean is calling because she is needing a back to work note . Please call   Thanks

## 2017-05-17 ENCOUNTER — Encounter: Payer: Self-pay | Admitting: *Deleted

## 2017-05-17 ENCOUNTER — Encounter: Payer: Self-pay | Admitting: Diagnostic Neuroimaging

## 2017-05-17 ENCOUNTER — Telehealth: Payer: Self-pay | Admitting: Anesthesiology

## 2017-05-17 ENCOUNTER — Ambulatory Visit: Payer: BLUE CROSS/BLUE SHIELD | Admitting: Diagnostic Neuroimaging

## 2017-05-17 ENCOUNTER — Encounter: Payer: Self-pay | Admitting: Cardiovascular Disease

## 2017-05-17 VITALS — BP 132/82 | HR 52 | Ht 67.5 in | Wt 257.4 lb

## 2017-05-17 DIAGNOSIS — I639 Cerebral infarction, unspecified: Secondary | ICD-10-CM

## 2017-05-17 DIAGNOSIS — E785 Hyperlipidemia, unspecified: Secondary | ICD-10-CM

## 2017-05-17 DIAGNOSIS — I10 Essential (primary) hypertension: Secondary | ICD-10-CM | POA: Diagnosis not present

## 2017-05-17 NOTE — Telephone Encounter (Signed)
Pt called in stated she needs a RTW note from [facility] today. She is to go back to work on Monday. Please call and advise.

## 2017-05-17 NOTE — Progress Notes (Signed)
[facility] [address] [place]. [place] [address] [phone]       OFFICE FOLLOW UP NOTE  [name] Date of Birth:  1966-03-13 Medical Record Number:  [id number]   Reason for Referral:  Stroke hospital follow up  CHIEF COMPLAINT:  Chief Complaint  Patient presents with  . Stroke    rm 6, New Pt, hospital FU, dgtr- Erin Dean, "completed OT, doing well; want to return to work Monday"    HPI:   52 y.o. female Erin Dean is being seen today for her initial hospital follow up in the office for left MCA infarct on 04/17/17. Patient is accompanied by her daughter and infant grandson. History obtained from patient, daughter, and chart review. Reviewed all radiology images and labs personally. Erin Dean is a 52 year old female with PMH of HTN, HLD and chronic renal insufficiency. She awoke in her normal state of health on 04/17/17 and was at work at 5:30am when she developed difficulty speaking. She states that it was an abrupt change in that she was normal before this. She denied numbness or weakness or any other neurological symptoms. Upon arrived to [facility] ED, she was found to have severe expressive aphasia but was able to answer yes/no questions with head nods. CT head showed acute nonhemorrhagic infarction affecting the left occipital cortex. Unable to obtain CTA due to renal insufficiency. TPA was administered in which she improved markedly. Pt c/o abdominal pain with emesis -  concerns for hematemesis post TPA. Upper endoscopy was negative for active bleeding and only mild gastritis. MRI showed acute infarction in left posterior M3 branch distribution affecting the temporoparietal junction, measuring about 5 cm in size. Swelling was noted but no hemorrhage or mass effect. MRA showed no large vessel anterior circulation finding with probable missing posterior M3 branch on the left and stenotic distal right vertebral artery. Carotid ultrasound showed right  vertebral artery partially occluded. TEE negative. Patient hypertensive throughout admission ranging from 168-222/73-107. LDL 129. Hemoglobin A1c 5.2. Patient d/c'd with lipitor 40mg  (NEW), ASA 325mg  (NEW), clonidine (NEW), coreg (PTA, increased dose), norvasc (PTA), hydralazine (PTA), and lasix (PTA). Loop recorder inserted on 04/22/17. PT/OT recommended home PT/OT.  Since hospital discharge, patient doing well. All symptoms have resolved completely and per patient, she is back to her baseline. Pt c/o lower extremity edema in which she had mild edema prior to hospitalization but per patient and daughter, has increased in amt. Denies numbness, tingling, or weakness. Denies past stroke/TIA symptoms. Continues to take her lipitor without side effects. Continues to take aspirin 325mg  and denies increased bruising or bleeding. BP stable at todays appointment - 132/82. Patient states she checks her BP regularly at home and this is the normal - per patient, she is compliant with all blood pressure medications. Denies dizziness or lightheadedness. Loop recorder without issues - no readings to date. Requesting to return to work at a [employer]. No new or worsening TIA/stroke symptoms.     ROS:   14 system review of systems performed and negative with exception of: feet swelling and snoring  PMH:  Past Medical History:  Diagnosis Date  . Hypertension   . Stroke ([facility]) 04/2017  . SVT (supraventricular tachycardia) (HCC)     PSH:  Past Surgical History:  Procedure Laterality Date  . CESAREAN SECTION    . ESOPHAGOGASTRODUODENOSCOPY N/A 04/19/2017   Procedure: ESOPHAGOGASTRODUODENOSCOPY (EGD);  Surgeon: Carol Ada, MD;  Location: [facility];  Service: Endoscopy;  Laterality: N/A;  .  LOOP RECORDER INSERTION N/A 04/22/2017   Procedure: LOOP RECORDER INSERTION;  Surgeon: Constance Haw, MD;  Location: [facility];  Service: Cardiovascular;  Laterality: N/A;  . TEE WITHOUT CARDIOVERSION N/A  04/19/2017   Procedure: TRANSESOPHAGEAL ECHOCARDIOGRAM (TEE);  Surgeon: Carol Ada, MD;  Location: [facility];  Service: Endoscopy;  Laterality: N/A;    Social History:  Social History   Socioeconomic History  . Marital status: Legally Separated    Spouse name: Not on file  . Number of children: Not on file  . Years of education: Not on file  . Highest education level: Not on file  Social Needs  . Financial resource strain: Not on file  . Food insecurity - worry: Not on file  . Food insecurity - inability: Not on file  . Transportation needs - medical: Not on file  . Transportation needs - non-medical: Not on file  Occupational History  . Not on file  Tobacco Use  . Smoking status: Never Smoker  . Smokeless tobacco: Never Used  Substance and Sexual Activity  . Alcohol use: No  . Drug use: No  . Sexual activity: Not on file  Other Topics Concern  . Not on file  Social History Narrative   Lives with daughter   Education- high school   On temp disability    Family History:  Family History  Problem Relation Age of Onset  . Heart attack Maternal Uncle   . Heart attack Maternal Grandfather   . Stroke Paternal Grandmother   . Cancer Father   . Hypertension Sister     Medications:   Current Outpatient Medications on File Prior to Visit  Medication Sig Dispense Refill  . ALPRAZolam (XANAX) 0.5 MG tablet Take 0.25-0.5 mg by mouth 3 (three) times daily as needed (anxiety). Pt has not taken yet    . amLODipine (NORVASC) 10 MG tablet Take 1 tablet (10 mg total) by mouth daily. 180 tablet 3  . aspirin EC 325 MG EC tablet Take 1 tablet (325 mg total) by mouth daily. 30 tablet 0  . atorvastatin (LIPITOR) 40 MG tablet Take 1 tablet (40 mg total) by mouth daily at 6 PM. 30 tablet 1  . carvedilol (COREG) 12.5 MG tablet Take 1 tablet (12.5 mg total) by mouth 2 (two) times daily. 30 tablet 1  . cloNIDine (CATAPRES) 0.1 MG tablet Take 1 tablet (0.1 mg total) by mouth 3 (three)  times daily. 60 tablet 11  . escitalopram (LEXAPRO) 10 MG tablet Take 10 mg by mouth daily.    . ferrous sulfate 325 (65 FE) MG tablet Take 1 tablet (325 mg total) by mouth 3 (three) times daily with meals. 90 tablet 3  . furosemide (LASIX) 80 MG tablet Take 1 tablet (80 mg total) by mouth daily. 30 tablet 1  . hydrALAZINE (APRESOLINE) 100 MG tablet Take 1 tablet (100 mg total) by mouth 3 (three) times daily. 30 tablet 1  . HYDROcodone-acetaminophen (NORCO/VICODIN) 5-325 MG tablet Take 2 tablets by mouth every 4 (four) hours as needed. 16 tablet 0  . pantoprazole (PROTONIX) 40 MG tablet Take 1 tablet (40 mg total) by mouth daily. 30 tablet 1  . vitamin B-12 1000 MCG tablet Take 1 tablet (1,000 mcg total) by mouth daily. 30 tablet 1   No current facility-administered medications on file prior to visit.     Allergies:  No Known Allergies  Physical Exam  Vitals:   05/17/17 0846  BP: 132/82  Pulse: (!) 52  Weight: 257 lb 6.4 oz (116.8 kg)  Height: 5' 7.5" (1.715 m)   Body mass index is 39.72 kg/m.  Visual Acuity Screening   Right eye Left eye Both eyes  Without correction:     With correction: 20/40 20/30     General: well developed, middle aged obese female, well nourished, seated, in no evident distress Head: head normocephalic and atraumatic.   Neck: supple with no carotid or supraclavicular bruits Cardiovascular: regular rate and rhythm, no murmurs, 2+ pitting edema BLE Musculoskeletal: no deformity Skin:  no rash/petichiae Vascular:  Normal pulses all extremities  Neurologic Exam Mental Status: Awake and fully alert. Oriented to place and time. Recent and remote memory intact. Attention span, concentration and fund of knowledge appropriate. Mood and affect appropriate.  Cranial Nerves: Fundoscopic exam reveals sharp disc margins. Pupils equal, briskly reactive to light. Extraocular movements full without nystagmus. Visual fields full to confrontation. Hearing intact. Facial  sensation intact. Face, tongue, palate moves normally and symmetrically.  Motor: Normal bulk and tone. Normal strength in all tested extremity muscles. Sensory.: intact to touch , pinprick , position and vibratory sensation.  Coordination: Rapid alternating movements normal in all extremities. Finger-to-nose and heel-to-shin performed accurately bilaterally. Gait and Station: Arises from chair without difficulty. Stance is normal. Gait demonstrates normal stride length and balance . Able to heel, toe and tandem walk without difficulty.  Reflexes: 2+ and symmetric. Toes downgoing.    NIHSS  0 Modified Rankin  0   Imaging  MRI/MRA Head/Brain Wo Contrast 04/18/2017 (I reviewed the imaging and agree with the findings below)  Acute infarction in a left posterior M3 branch distribution affecting the temporoparietal junction, measuring about 5 cm in size. Swelling but no hemorrhage or mass effect. Mild chronic small-vessel ischemic changes elsewhere within the hemispheric white matter. No large vessel anterior circulation finding. Probable missing posterior M3 branch on the left. Stenotic distal right vertebral artery.   Ct Head 04/17/2017 (I reviewed the imaging and agree with the findings below EXCEPT report should say LEFT occipital cortex, not RIGHT) Acute nonhemorrhagic infarction affecting the RIGHT occipital cortex and underlying white matter, as described above.Vascular distribution corresponds to the posterior-most M3 division LEFT MCA.   Echocardiogram:                                               Study Conclusions - Left ventricle: The cavity size was normal. Wall thickness was normal. Systolic function was normal. The estimated ejection fraction was in the range of 60% to 65%. - Aortic valve: There was mild regurgitation. - Mitral valve: There was mild regurgitation. - Left atrium: The atrium was moderately dilated. - Right atrium: The atrium was mildly dilated. - Atrial  septum: No defect or patent foramen ovale was identified.  TEE  04/19/17 No cardiac source of emboli indentified  B/L Carotid U/S 04/18/17                                     Right Carotid: There is evidence in the right ICA of a 1-39% stenosis. Left Carotid: There is evidence in the left ICA of a 1-39% stenosis. Vertebrals: Left vertebral artery was patent with antegrade flow. Right vertebral appears partially occluded.  Renal Artery U/S 1-59% stenosis of right renal artery  No evidence of left renal artery stenosis   ASSESSMENT:  52 y.o. year old female here with embolic stroke on 12/14/92, unknown source. Vascular risk factors are HTN, HLD, and obesity.    1. 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 Essential hypertension   3. Hyperlipidemia, unspecified hyperlipidemia type      PLAN:  -instructed patient to continue taking aspirin 325mg  and Lipitor 40mg  for secondary stroke prevention. Intrusted patient to continue to check BP at home and maintain satisfactory levels of <130/80 with compliance of hypertension medications. Pt will have PCP check lipid panel in 2-3 months.  -loop recorder - continue to monitor for atrial fibrillation as this stroke was most likely cardioembolic -instructed patient to continue with diet, exercise, and weight loss -provided patient with note for returning to work; may return to work from neurologic standpoint -follow up in 6 months  Return in about 6 months (around 11/14/2017).  Greater than 50% of time during this 25 minute visit was spent on counseling, explanation of diagnosis, planning of further management, discussion with patient and family and coordination of care.    Venancio Poisson, NP and   Penni Bombard, MD 8/0/1655, 3:74 PM Certified in Neurology, Neurophysiology and Neuroimaging  [facility] [address], [place], [place] [address] [phone]

## 2017-05-17 NOTE — Telephone Encounter (Signed)
Spoke with Dr. Gwenlyn Found who stated pt is cleared to return to work. Will call pt to let her know.

## 2017-05-17 NOTE — Patient Instructions (Addendum)
-  consider following up with gastroenterologist for gastritis during January hospital stay  -continue aspirin and lipitor for secondary stroke prevention  -continue to monitor BP at home and take all medications as prescribed  -maintain blood pressure <130/80 and LDL (bad cholesterol) <70 - please have PCP check  Cholesterol levels in 2-3 months  -okay to return to work from neurology stand point  -follow up in 6 months

## 2017-05-17 NOTE — Telephone Encounter (Signed)
Erin Dean is calling because she is going to need to have the return to work note by Monday . Wanting to know can she get the note today . Please call

## 2017-05-17 NOTE — Telephone Encounter (Signed)
Per chart review, today patient's neurologist advised she could return to work.   Will route to [place], CMA for assistance with letter

## 2017-05-20 NOTE — Telephone Encounter (Signed)
Called pt on Friday 05/17/17 and informed her that letter was waiting for her in the front lobby. Pt stated she would pick it up and was thankful for the call.

## 2017-05-22 ENCOUNTER — Ambulatory Visit (INDEPENDENT_AMBULATORY_CARE_PROVIDER_SITE_OTHER): Payer: BLUE CROSS/BLUE SHIELD | Admitting: *Deleted

## 2017-05-22 DIAGNOSIS — I639 Cerebral infarction, unspecified: Secondary | ICD-10-CM

## 2017-05-22 NOTE — Progress Notes (Signed)
Carelink Summary Report / Loop Recorder 

## 2017-05-28 ENCOUNTER — Ambulatory Visit: Payer: BLUE CROSS/BLUE SHIELD | Admitting: Speech Pathology

## 2017-05-29 ENCOUNTER — Telehealth: Payer: Self-pay | Admitting: Cardiology

## 2017-05-29 NOTE — Telephone Encounter (Signed)
Attempted to call pt b/c her home monitor has not updated in at least 14 days. No answer and unable to leave a message.  

## 2017-06-06 ENCOUNTER — Encounter: Payer: Self-pay | Admitting: Cardiology

## 2017-06-14 LAB — CUP PACEART REMOTE DEVICE CHECK
Implantable Pulse Generator Implant Date: 20190114
MDC IDC SESS DTM: 20190213172944

## 2017-06-18 ENCOUNTER — Ambulatory Visit: Payer: Self-pay | Admitting: Diagnostic Neuroimaging

## 2017-06-24 ENCOUNTER — Ambulatory Visit (INDEPENDENT_AMBULATORY_CARE_PROVIDER_SITE_OTHER): Payer: BLUE CROSS/BLUE SHIELD | Admitting: *Deleted

## 2017-06-24 ENCOUNTER — Other Ambulatory Visit: Payer: Self-pay

## 2017-06-24 DIAGNOSIS — I639 Cerebral infarction, unspecified: Secondary | ICD-10-CM | POA: Diagnosis not present

## 2017-06-24 DIAGNOSIS — N185 Chronic kidney disease, stage 5: Secondary | ICD-10-CM

## 2017-06-25 NOTE — Progress Notes (Signed)
Carelink Summary Report / Loop Recorder 

## 2017-06-26 ENCOUNTER — Telehealth: Payer: Self-pay | Admitting: Cardiology

## 2017-06-26 NOTE — Telephone Encounter (Signed)
Attempted to call pt b/c her home monitor has not updated in at least 14 days. No answer and unable to leave a message.  

## 2017-07-02 ENCOUNTER — Encounter: Payer: Self-pay | Admitting: Cardiology

## 2017-07-18 ENCOUNTER — Encounter: Payer: Self-pay | Admitting: Cardiology

## 2017-07-23 ENCOUNTER — Other Ambulatory Visit: Payer: Self-pay

## 2017-07-23 NOTE — Patient Outreach (Signed)
Telephone outreach to patient to obtain mRS was successfully completed. mRS = 1 

## 2017-07-29 ENCOUNTER — Encounter: Payer: BLUE CROSS/BLUE SHIELD | Admitting: *Deleted

## 2017-07-31 ENCOUNTER — Encounter: Payer: Self-pay | Admitting: *Deleted

## 2017-07-31 ENCOUNTER — Encounter: Payer: Self-pay | Admitting: Vascular Surgery

## 2017-07-31 ENCOUNTER — Other Ambulatory Visit: Payer: Self-pay | Admitting: *Deleted

## 2017-07-31 ENCOUNTER — Ambulatory Visit: Payer: BLUE CROSS/BLUE SHIELD | Admitting: Vascular Surgery

## 2017-07-31 ENCOUNTER — Ambulatory Visit (HOSPITAL_COMMUNITY)
Admission: RE | Admit: 2017-07-31 | Discharge: 2017-07-31 | Disposition: A | Discharge status: Home / Self Care | Payer: BLUE CROSS/BLUE SHIELD | Source: Ambulatory Visit | Attending: Vascular Surgery | Admitting: Vascular Surgery

## 2017-07-31 ENCOUNTER — Ambulatory Visit (INDEPENDENT_AMBULATORY_CARE_PROVIDER_SITE_OTHER)
Admission: RE | Admit: 2017-07-31 | Discharge: 2017-07-31 | Disposition: A | Discharge status: Home / Self Care | Payer: BLUE CROSS/BLUE SHIELD | Source: Ambulatory Visit | Attending: Vascular Surgery | Admitting: Vascular Surgery

## 2017-07-31 DIAGNOSIS — Z01818 Encounter for other preprocedural examination: Secondary | ICD-10-CM | POA: Insufficient documentation

## 2017-07-31 DIAGNOSIS — N185 Chronic kidney disease, stage 5: Secondary | ICD-10-CM | POA: Diagnosis not present

## 2017-07-31 NOTE — Progress Notes (Signed)
Requested by:  Lois Huxley, [address] [place], [place] [address]  Reason for consultation: New access   History of Present Illness   Erin Dean is a 52 y.o. (1965-08-23) female who presents for evaluation for permanent access.  The patient is left hand dominant.  The patient has not had previous access procedures.  Previous central venous cannulation procedures include: none.  The patient has never had a PPM placed. Pt currently has generalize ill sensation constantly and bilateral leg swelling.  Past Medical History:  Diagnosis Date  . Hypertension   . Stroke ([facility]) 04/2017  . SVT (supraventricular tachycardia) (HCC)     Past Surgical History:  Procedure Laterality Date  . CESAREAN SECTION    . ESOPHAGOGASTRODUODENOSCOPY N/A 04/19/2017   Procedure: ESOPHAGOGASTRODUODENOSCOPY (EGD);  Surgeon: Carol Ada, MD;  Location: [facility];  Service: Endoscopy;  Laterality: N/A;  . LOOP RECORDER INSERTION N/A 04/22/2017   Procedure: LOOP RECORDER INSERTION;  Surgeon: Constance Haw, MD;  Location: [facility];  Service: Cardiovascular;  Laterality: N/A;  . TEE WITHOUT CARDIOVERSION N/A 04/19/2017   Procedure: TRANSESOPHAGEAL ECHOCARDIOGRAM (TEE);  Surgeon: Carol Ada, MD;  Location: [facility];  Service: Endoscopy;  Laterality: N/A;    Social History   Socioeconomic History  . Marital status: Legally Separated    Spouse name: Not on file  . Number of children: Not on file  . Years of education: Not on file  . Highest education level: Not on file  Occupational History  . Not on file  Social Needs  . Financial resource strain: Not on file  . Food insecurity:    Worry: Not on file    Inability: Not on file  . Transportation needs:    Medical: Not on file    Non-medical: Not on file  Tobacco Use  . Smoking status: Never Smoker  . Smokeless tobacco: Never Used  Substance and Sexual Activity  . Alcohol use: No  . Drug use: No  . Sexual  activity: Not on file  Lifestyle  . Physical activity:    Days per week: Not on file    Minutes per session: Not on file  . Stress: Not on file  Relationships  . Social connections:    Talks on phone: Not on file    Gets together: Not on file    Attends religious service: Not on file    Active member of club or organization: Not on file    Attends meetings of clubs or organizations: Not on file    Relationship status: Not on file  . Intimate partner violence:    Fear of current or ex partner: Not on file    Emotionally abused: Not on file    Physically abused: Not on file    Forced sexual activity: Not on file  Other Topics Concern  . Not on file  Social History Narrative   Lives with daughter   Education- high school   On temp disability    Family History  Problem Relation Age of Onset  . Heart attack Maternal Uncle   . Heart attack Maternal Grandfather   . Stroke Paternal Grandmother   . Cancer Father   . Hypertension Sister     Current Outpatient Medications  Medication Sig Dispense Refill  . ALPRAZolam (XANAX) 0.5 MG tablet Take 0.25-0.5 mg by mouth 3 (three) times daily as needed (anxiety). Pt has not taken yet    . amLODipine (  NORVASC) 10 MG tablet Take 1 tablet (10 mg total) by mouth daily. 180 tablet 3  . aspirin EC 325 MG EC tablet Take 1 tablet (325 mg total) by mouth daily. 30 tablet 0  . atorvastatin (LIPITOR) 40 MG tablet Take 1 tablet (40 mg total) by mouth daily at 6 PM. 30 tablet 1  . carvedilol (COREG) 12.5 MG tablet Take 1 tablet (12.5 mg total) by mouth 2 (two) times daily. 30 tablet 1  . carvedilol (COREG) 25 MG tablet TK 1 T PO BID  3  . cloNIDine (CATAPRES) 0.1 MG tablet Take 1 tablet (0.1 mg total) by mouth 3 (three) times daily. 60 tablet 11  . escitalopram (LEXAPRO) 10 MG tablet Take 10 mg by mouth daily.    . ferrous sulfate 325 (65 FE) MG tablet Take 1 tablet (325 mg total) by mouth 3 (three) times daily with meals. 90 tablet 3  . furosemide  (LASIX) 80 MG tablet Take 1 tablet (80 mg total) by mouth daily. 30 tablet 1  . hydrALAZINE (APRESOLINE) 100 MG tablet Take 1 tablet (100 mg total) by mouth 3 (three) times daily. 30 tablet 1  . HYDROcodone-acetaminophen (NORCO/VICODIN) 5-325 MG tablet Take 2 tablets by mouth every 4 (four) hours as needed. 16 tablet 0  . ondansetron (ZOFRAN) 4 MG tablet   0  . pantoprazole (PROTONIX) 40 MG tablet Take 1 tablet (40 mg total) by mouth daily. 30 tablet 1  . vitamin B-12 1000 MCG tablet Take 1 tablet (1,000 mcg total) by mouth daily. 30 tablet 1   No current facility-administered medications for this visit.     No Known Allergies  REVIEW OF SYSTEMS (negative unless checked):   Cardiac:  []  Chest pain or chest pressure? []  Shortness of breath upon activity? []  Shortness of breath when lying flat? []  Irregular heart rhythm?  Vascular:  []  Pain in calf, thigh, or hip brought on by walking? []  Pain in feet at night that wakes you up from your sleep? []  Blood clot in your veins? [x]  Leg swelling?  Pulmonary:  []  Oxygen at home? []  Productive cough? []  Wheezing?  Neurologic:  []  Sudden weakness in arms or legs? []  Sudden numbness in arms or legs? []  Sudden onset of difficult speaking or slurred speech? []  Temporary loss of vision in one eye? [x]  Problems with dizziness?  Gastrointestinal:  []  Blood in stool? []  Vomited blood?  Genitourinary:  []  Burning when urinating? []  Blood in urine?  Psychiatric:  []  Major depression  Hematologic:  []  Bleeding problems? []  Problems with blood clotting?  Dermatologic:  []  Rashes or ulcers?  Constitutional:  []  Fever or chills?  Ear/Nose/Throat:  []  Change in hearing? []  Nose bleeds? []  Sore throat?  Musculoskeletal:  []  Back pain? []  Joint pain? []  Muscle pain?   Physical Examination     Vitals:   07/31/17 1305  BP: (!) 145/87  Pulse: (!) 54  Resp: 16  Temp: (!) 97.4 F (36.3 C)  TempSrc: Oral  SpO2: 100%    Weight: 259 lb 1.6 oz (117.5 kg)  Height: 5' 7.5" (1.715 m)   Body mass index is 39.98 kg/m.  General Alert, O x 3, WD, NAD  Head [place]/AT,    Ear/Nose/ Throat Hearing grossly intact, nares without erythema or drainage, oropharynx without Erythema or Exudate, Mallampati score: 3,   Eyes PERRLA, EOMI,    Neck Supple, mid-line trachea,    Pulmonary Sym exp, good B air movt, CTA B  Cardiac  RRR, Nl S1, S2, no Murmurs, No rubs, No S3,S4  Vascular Vessel Right Left  Radial Palpable Palpable  Brachial Palpable Palpable  Carotid Palpable, No Bruit Palpable, No Bruit  Aorta Not palpable N/A  Femoral Palpable Palpable  Popliteal Not palpable Not palpable  PT Not palpable Not palpable  DP Not palpable Not palpable    Gastro- intestinal soft, non-distended, non-tender to palpation, No guarding or rebound, no HSM, no masses, no CVAT B, No palpable prominent aortic pulse,    Musculo- skeletal M/S 5/5 throughout  , Extremities without ischemic changes  , Non-pitting edema present: B 2+, No visible varicosities , No Lipodermatosclerosis present  Neurologic Cranial nerves 2-12 intact , Pain and light touch intact in extremities , Motor exam as listed above  Psychiatric Judgement intact, Mood & affect appropriate for pt's clinical situation  Dermatologic See M/S exam for extremity exam, No rashes otherwise noted  Lymphatic  Palpable lymph nodes: None     Non-invasive Vascular Imaging   BUE Vein Mapping  (Date: 07/31/2017):   R arm: acceptable vein conduits include entire cephalic vein, upper arm basilic vein  L arm: acceptable vein conduits include entire cephalic vein, upper arm basilic vein (4 cm entry into brachial vein)  BUE Doppler (Date: 07/31/2017):   R arm:   Brachial: tri, 4.6 mm  Radial: tri, 2.4 mm  Ulnar: tri, 2.3 mm  L arm:   Brachial: tri, 5.2 mm  Radial: tri, 2.3 mm  Ulnar: tri, 2.8 mm   Outside Studies/Documentation   6 pages of outside documents were  reviewed including: outpatient nephrology chart.   Medical Decision Making   DEERICA WASZAK is a 52 y.o. female who presents with chronic kidney disease stage V   Based on vein mapping and examination, this patient's permanent access options include: R RC vs BC AVF, R staged BVT, L BC AVF, L staged BVT  I would first proceed with right RC vs BC AVF .  I had an extensive discussion with this patient in regards to the nature of access surgery, including risk, benefits, and alternatives.    The patient is aware that the risks of access surgery include but are not limited to: bleeding, infection, steal syndrome, nerve damage, ischemic monomelic neuropathy, failure of access to mature, complications related to venous hypertension, and possible need for additional access procedures in the future.  The patient has agreed to proceed with the above procedure which will be scheduled 10 MAY 19.Adele Barthel, MD, FACS [facility] Office: [phone] Pager: [phone]  07/31/2017, 1:18 PM

## 2017-07-31 NOTE — H&P (View-Only) (Signed)
Requested by:  Lois Huxley, [place] [place], [place] [address]  Reason for consultation: New access   History of Present Illness   Erin Dean is a 52 y.o. ([birth year]) female who presents for evaluation for permanent access.  The patient is left hand dominant.  The patient has not had previous access procedures.  Previous central venous cannulation procedures include: none.  The patient has never had a PPM placed. Pt currently has generalize ill sensation constantly and bilateral leg swelling.  Past Medical History:  Diagnosis Date  . Hypertension   . Stroke ([facility]) 04/2017  . SVT (supraventricular tachycardia) ([facility])     Past Surgical History:  Procedure Laterality Date  . CESAREAN SECTION    . ESOPHAGOGASTRODUODENOSCOPY N/A 04/19/2017   Procedure: ESOPHAGOGASTRODUODENOSCOPY (EGD);  Surgeon: Carol Ada, MD;  Location: [facility];  Service: Endoscopy;  Laterality: N/A;  . LOOP RECORDER INSERTION N/A 04/22/2017   Procedure: LOOP RECORDER INSERTION;  Surgeon: Constance Haw, MD;  Location: [facility];  Service: Cardiovascular;  Laterality: N/A;  . TEE WITHOUT CARDIOVERSION N/A 04/19/2017   Procedure: TRANSESOPHAGEAL ECHOCARDIOGRAM (TEE);  Surgeon: Carol Ada, MD;  Location: [facility];  Service: Endoscopy;  Laterality: N/A;    Social History   Socioeconomic History  . Marital status: Legally Separated    Spouse name: Not on file  . Number of children: Not on file  . Years of education: Not on file  . Highest education level: Not on file  Occupational History  . Not on file  Social Needs  . Financial resource strain: Not on file  . Food insecurity:    Worry: Not on file    Inability: Not on file  . Transportation needs:    Medical: Not on file    Non-medical: Not on file  Tobacco Use  . Smoking status: Never Smoker  . Smokeless tobacco: Never Used  Substance and Sexual Activity  . Alcohol use: No  . Drug use: No  . Sexual  activity: Not on file  Lifestyle  . Physical activity:    Days per week: Not on file    Minutes per session: Not on file  . Stress: Not on file  Relationships  . Social connections:    Talks on phone: Not on file    Gets together: Not on file    Attends religious service: Not on file    Active member of club or organization: Not on file    Attends meetings of clubs or organizations: Not on file    Relationship status: Not on file  . Intimate partner violence:    Fear of current or ex partner: Not on file    Emotionally abused: Not on file    Physically abused: Not on file    Forced sexual activity: Not on file  Other Topics Concern  . Not on file  Social History Narrative   Lives with daughter   Education- high school   On temp disability    Family History  Problem Relation Age of Onset  . Heart attack Maternal Uncle   . Heart attack Maternal Grandfather   . Stroke Paternal Grandmother   . Cancer Father   . Hypertension Sister     Current Outpatient Medications  Medication Sig Dispense Refill  . ALPRAZolam (XANAX) 0.5 MG tablet Take 0.25-0.5 mg by mouth 3 (three) times daily as needed (anxiety). Pt has not taken yet    . amLODipine (  NORVASC) 10 MG tablet Take 1 tablet (10 mg total) by mouth daily. 180 tablet 3  . aspirin EC 325 MG EC tablet Take 1 tablet (325 mg total) by mouth daily. 30 tablet 0  . atorvastatin (LIPITOR) 40 MG tablet Take 1 tablet (40 mg total) by mouth daily at 6 PM. 30 tablet 1  . carvedilol (COREG) 12.5 MG tablet Take 1 tablet (12.5 mg total) by mouth 2 (two) times daily. 30 tablet 1  . carvedilol (COREG) 25 MG tablet TK 1 T PO BID  3  . cloNIDine (CATAPRES) 0.1 MG tablet Take 1 tablet (0.1 mg total) by mouth 3 (three) times daily. 60 tablet 11  . escitalopram (LEXAPRO) 10 MG tablet Take 10 mg by mouth daily.    . ferrous sulfate 325 (65 FE) MG tablet Take 1 tablet (325 mg total) by mouth 3 (three) times daily with meals. 90 tablet 3  . furosemide  (LASIX) 80 MG tablet Take 1 tablet (80 mg total) by mouth daily. 30 tablet 1  . hydrALAZINE (APRESOLINE) 100 MG tablet Take 1 tablet (100 mg total) by mouth 3 (three) times daily. 30 tablet 1  . HYDROcodone-acetaminophen (NORCO/VICODIN) 5-325 MG tablet Take 2 tablets by mouth every 4 (four) hours as needed. 16 tablet 0  . ondansetron (ZOFRAN) 4 MG tablet   0  . pantoprazole (PROTONIX) 40 MG tablet Take 1 tablet (40 mg total) by mouth daily. 30 tablet 1  . vitamin B-12 1000 MCG tablet Take 1 tablet (1,000 mcg total) by mouth daily. 30 tablet 1   No current facility-administered medications for this visit.     No Known Allergies  REVIEW OF SYSTEMS (negative unless checked):   Cardiac:  []  Chest pain or chest pressure? []  Shortness of breath upon activity? []  Shortness of breath when lying flat? []  Irregular heart rhythm?  Vascular:  []  Pain in calf, thigh, or hip brought on by walking? []  Pain in feet at night that wakes you up from your sleep? []  Blood clot in your veins? [x]  Leg swelling?  Pulmonary:  []  Oxygen at home? []  Productive cough? []  Wheezing?  Neurologic:  []  Sudden weakness in arms or legs? []  Sudden numbness in arms or legs? []  Sudden onset of difficult speaking or slurred speech? []  Temporary loss of vision in one eye? [x]  Problems with dizziness?  Gastrointestinal:  []  Blood in stool? []  Vomited blood?  Genitourinary:  []  Burning when urinating? []  Blood in urine?  Psychiatric:  []  Major depression  Hematologic:  []  Bleeding problems? []  Problems with blood clotting?  Dermatologic:  []  Rashes or ulcers?  Constitutional:  []  Fever or chills?  Ear/Nose/Throat:  []  Change in hearing? []  Nose bleeds? []  Sore throat?  Musculoskeletal:  []  Back pain? []  Joint pain? []  Muscle pain?   Physical Examination     Vitals:   07/31/17 1305  BP: (!) 145/87  Pulse: (!) 54  Resp: 16  Temp: (!) 97.4 F (36.3 C)  TempSrc: Oral  SpO2: 100%    Weight: 259 lb 1.6 oz (117.5 kg)  Height: 5' 7.5" (1.715 m)   Body mass index is 39.98 kg/m.  General Alert, O x 3, WD, NAD  Head [place]/AT,    Ear/Nose/ Throat Hearing grossly intact, nares without erythema or drainage, oropharynx without Erythema or Exudate, Mallampati score: 3,   Eyes PERRLA, EOMI,    Neck Supple, mid-line trachea,    Pulmonary Sym exp, good B air movt, CTA B  Cardiac  RRR, Nl S1, S2, no Murmurs, No rubs, No S3,S4  Vascular Vessel Right Left  Radial Palpable Palpable  Brachial Palpable Palpable  Carotid Palpable, No Bruit Palpable, No Bruit  Aorta Not palpable N/A  Femoral Palpable Palpable  Popliteal Not palpable Not palpable  PT Not palpable Not palpable  DP Not palpable Not palpable    Gastro- intestinal soft, non-distended, non-tender to palpation, No guarding or rebound, no HSM, no masses, no CVAT B, No palpable prominent aortic pulse,    Musculo- skeletal M/S 5/5 throughout  , Extremities without ischemic changes  , Non-pitting edema present: B 2+, No visible varicosities , No Lipodermatosclerosis present  Neurologic Cranial nerves 2-12 intact , Pain and light touch intact in extremities , Motor exam as listed above  Psychiatric Judgement intact, Mood & affect appropriate for pt's clinical situation  Dermatologic See M/S exam for extremity exam, No rashes otherwise noted  Lymphatic  Palpable lymph nodes: None     Non-invasive Vascular Imaging   BUE Vein Mapping  (Date: 07/31/2017):   R arm: acceptable vein conduits include entire cephalic vein, upper arm basilic vein  L arm: acceptable vein conduits include entire cephalic vein, upper arm basilic vein (4 cm entry into brachial vein)  BUE Doppler (Date: 07/31/2017):   R arm:   Brachial: tri, 4.6 mm  Radial: tri, 2.4 mm  Ulnar: tri, 2.3 mm  L arm:   Brachial: tri, 5.2 mm  Radial: tri, 2.3 mm  Ulnar: tri, 2.8 mm   Outside Studies/Documentation   6 pages of outside documents were  reviewed including: outpatient nephrology chart.   Medical Decision Making   Erin Dean is a 52 y.o. female who presents with chronic kidney disease stage V   Based on vein mapping and examination, this patient's permanent access options include: R RC vs BC AVF, R staged BVT, L BC AVF, L staged BVT  I would first proceed with right RC vs BC AVF .  I had an extensive discussion with this patient in regards to the nature of access surgery, including risk, benefits, and alternatives.    The patient is aware that the risks of access surgery include but are not limited to: bleeding, infection, steal syndrome, nerve damage, ischemic monomelic neuropathy, failure of access to mature, complications related to venous hypertension, and possible need for additional access procedures in the future.  The patient has agreed to proceed with the above procedure which will be scheduled 10 MAY 19.Adele Barthel, MD, FACS [facility] Office: [phone] Pager: [phone]  07/31/2017, 1:18 PM

## 2017-08-01 ENCOUNTER — Encounter: Payer: Self-pay | Admitting: Nephrology

## 2017-08-02 LAB — CUP PACEART REMOTE DEVICE CHECK
Date Time Interrogation Session: 20190318174254
Implantable Pulse Generator Implant Date: 20190114

## 2017-08-15 ENCOUNTER — Encounter (HOSPITAL_COMMUNITY): Payer: Self-pay | Admitting: *Deleted

## 2017-08-15 ENCOUNTER — Other Ambulatory Visit: Payer: Self-pay

## 2017-08-15 MED ORDER — DEXTROSE 5 % IV SOLN
3.0000 g | INTRAVENOUS | Status: AC
  Administered 2017-08-16: 3 g via INTRAVENOUS
  Filled 2017-08-15: qty 3

## 2017-08-15 NOTE — Anesthesia Preprocedure Evaluation (Addendum)
Anesthesia Evaluation  Patient identified by MRN, date of birth, ID band Patient awake    Reviewed: Allergy & Precautions, NPO status , Patient's Chart, lab work & pertinent test results  Airway Mallampati: II  TM Distance: >3 FB Neck ROM: Full    Dental no notable dental hx. (+) Teeth Intact, Dental Advisory Given   Pulmonary neg pulmonary ROS,    Pulmonary exam normal breath sounds clear to auscultation       Cardiovascular hypertension, Normal cardiovascular exam Rhythm:Regular Rate:Normal  1/61/09 Echo Systolic function was normal. The   estimated ejection fraction was in the range of 55% to 60%. Wall   motion was normal;   Neuro/Psych CVA negative psych ROS   GI/Hepatic negative GI ROS, Neg liver ROS,   Endo/Other  negative endocrine ROSMorbid obesity  Renal/GU   negative genitourinary   Musculoskeletal negative musculoskeletal ROS (+)   Abdominal   Peds negative pediatric ROS (+)  Hematology negative hematology ROS (+)   Anesthesia Other Findings   Reproductive/Obstetrics negative OB ROS                             Lab Results  Component Value Date   WBC 7.5 04/23/2017   HGB 8.6 (L) 04/23/2017   HCT 28.1 (L) 04/23/2017   MCV 84.4 04/23/2017   PLT 288 04/23/2017   Lab Results  Component Value Date   CREATININE 3.64 (H) 04/23/2017   BUN 34 (H) 04/23/2017   NA 138 04/23/2017   K 3.7 04/23/2017   CL 106 04/23/2017   CO2 22 04/23/2017    Anesthesia Physical Anesthesia Plan  ASA: III  Anesthesia Plan: MAC   Post-op Pain Management:    Induction: Intravenous  PONV Risk Score and Plan:   Airway Management Planned: Mask, Natural Airway and Nasal Cannula  Additional Equipment:   Intra-op Plan:   Post-operative Plan:   Informed Consent: I have reviewed the patients History and Physical, chart, labs and discussed the procedure including the risks, benefits and  alternatives for the proposed anesthesia with the patient or authorized representative who has indicated his/her understanding and acceptance.     Plan Discussed with: CRNA and Anesthesiologist  Anesthesia Plan Comments:         Anesthesia Quick Evaluation

## 2017-08-15 NOTE — Progress Notes (Signed)
Ms Erin Dean denies chest pain or shortness, does not have any residual effects from stroke. Ms Erin Dean reports that she has not had a period for almost a year, until Monday and Tuesday of this week and she has had a  light amount of bleeding. Patient reports that she makes some urine- "not a lot" , I informed patient that we will need a urine specimen in the morning if possible.

## 2017-08-16 ENCOUNTER — Telehealth: Payer: Self-pay | Admitting: Vascular Surgery

## 2017-08-16 ENCOUNTER — Ambulatory Visit (HOSPITAL_COMMUNITY): Payer: BLUE CROSS/BLUE SHIELD | Admitting: Anesthesiology

## 2017-08-16 ENCOUNTER — Encounter (HOSPITAL_COMMUNITY)
Admission: RE | Disposition: A | Discharge status: Home / Self Care | Payer: Self-pay | Source: Ambulatory Visit | Attending: Vascular Surgery

## 2017-08-16 ENCOUNTER — Encounter (HOSPITAL_COMMUNITY): Payer: Self-pay

## 2017-08-16 ENCOUNTER — Ambulatory Visit (HOSPITAL_COMMUNITY)
Admission: RE | Admit: 2017-08-16 | Discharge: 2017-08-16 | Disposition: A | Discharge status: Home / Self Care | Payer: BLUE CROSS/BLUE SHIELD | Source: Ambulatory Visit | Attending: Vascular Surgery | Admitting: Vascular Surgery

## 2017-08-16 DIAGNOSIS — I120 Hypertensive chronic kidney disease with stage 5 chronic kidney disease or end stage renal disease: Secondary | ICD-10-CM | POA: Insufficient documentation

## 2017-08-16 DIAGNOSIS — Z7982 Long term (current) use of aspirin: Secondary | ICD-10-CM | POA: Diagnosis not present

## 2017-08-16 DIAGNOSIS — Z8673 Personal history of transient ischemic attack (TIA), and cerebral infarction without residual deficits: Secondary | ICD-10-CM | POA: Diagnosis not present

## 2017-08-16 DIAGNOSIS — Z6839 Body mass index (BMI) 39.0-39.9, adult: Secondary | ICD-10-CM | POA: Diagnosis not present

## 2017-08-16 DIAGNOSIS — Z79899 Other long term (current) drug therapy: Secondary | ICD-10-CM | POA: Diagnosis not present

## 2017-08-16 DIAGNOSIS — N185 Chronic kidney disease, stage 5: Secondary | ICD-10-CM | POA: Insufficient documentation

## 2017-08-16 HISTORY — DX: Chronic kidney disease, unspecified: N18.9

## 2017-08-16 HISTORY — PX: AV FISTULA PLACEMENT: SHX1204

## 2017-08-16 LAB — POCT I-STAT 4, (NA,K, GLUC, HGB,HCT)
Glucose, Bld: 103 mg/dL — ABNORMAL HIGH (ref 65–99)
HCT: 31 % — ABNORMAL LOW (ref 36.0–46.0)
Hemoglobin: 10.5 g/dL — ABNORMAL LOW (ref 12.0–15.0)
Potassium: 5.4 mmol/L — ABNORMAL HIGH (ref 3.5–5.1)
SODIUM: 139 mmol/L (ref 135–145)

## 2017-08-16 LAB — HCG, SERUM, QUALITATIVE: Preg, Serum: NEGATIVE

## 2017-08-16 SURGERY — ARTERIOVENOUS (AV) FISTULA CREATION
Anesthesia: Monitor Anesthesia Care | Site: Arm Upper | Laterality: Right

## 2017-08-16 MED ORDER — LIDOCAINE HCL (PF) 1 % IJ SOLN
INTRAMUSCULAR | Status: AC
  Filled 2017-08-16: qty 30

## 2017-08-16 MED ORDER — FENTANYL CITRATE (PF) 250 MCG/5ML IJ SOLN
INTRAMUSCULAR | Status: AC
  Filled 2017-08-16: qty 5

## 2017-08-16 MED ORDER — SODIUM POLYSTYRENE SULFONATE 15 GM/60ML PO SUSP
30.0000 g | Freq: Once | ORAL | Status: DC
  Filled 2017-08-16: qty 120

## 2017-08-16 MED ORDER — CHLORHEXIDINE GLUCONATE 4 % EX LIQD: 60.0000 mL | Freq: Once | CUTANEOUS | Status: DC

## 2017-08-16 MED ORDER — LIDOCAINE HCL (PF) 1 % IJ SOLN
INTRAMUSCULAR | Status: DC | PRN
  Administered 2017-08-16: 5 mL

## 2017-08-16 MED ORDER — PHENYLEPHRINE 40 MCG/ML (10ML) SYRINGE FOR IV PUSH (FOR BLOOD PRESSURE SUPPORT)
PREFILLED_SYRINGE | INTRAVENOUS | Status: AC
  Filled 2017-08-16: qty 10

## 2017-08-16 MED ORDER — SODIUM CHLORIDE 0.9 % IV SOLN
INTRAVENOUS | Status: DC | PRN
  Administered 2017-08-16: 500 mL

## 2017-08-16 MED ORDER — ONDANSETRON HCL 4 MG/2ML IJ SOLN
INTRAMUSCULAR | Status: DC | PRN
  Administered 2017-08-16: 4 mg via INTRAVENOUS

## 2017-08-16 MED ORDER — FENTANYL CITRATE (PF) 100 MCG/2ML IJ SOLN
INTRAMUSCULAR | Status: DC | PRN
  Administered 2017-08-16 (×2): 50 ug via INTRAVENOUS

## 2017-08-16 MED ORDER — ONDANSETRON HCL 4 MG/2ML IJ SOLN
INTRAMUSCULAR | Status: AC
  Filled 2017-08-16: qty 2

## 2017-08-16 MED ORDER — PHENYLEPHRINE HCL 10 MG/ML IJ SOLN
INTRAMUSCULAR | Status: AC
  Filled 2017-08-16: qty 1

## 2017-08-16 MED ORDER — MIDAZOLAM HCL 2 MG/2ML IJ SOLN
INTRAMUSCULAR | Status: DC | PRN
  Administered 2017-08-16 (×2): 1 mg via INTRAVENOUS

## 2017-08-16 MED ORDER — SODIUM CHLORIDE 0.9 % IV SOLN
INTRAVENOUS | Status: AC
  Filled 2017-08-16: qty 1.2

## 2017-08-16 MED ORDER — DEXTROSE 5 % IV SOLN
INTRAVENOUS | Status: DC | PRN
  Administered 2017-08-16: 30 ug/min via INTRAVENOUS

## 2017-08-16 MED ORDER — SODIUM CHLORIDE 0.9 % IV SOLN
INTRAVENOUS | Status: DC
  Administered 2017-08-16: 09:00:00 via INTRAVENOUS

## 2017-08-16 MED ORDER — SODIUM CHLORIDE 0.9 % IV SOLN
INTRAVENOUS | Status: DC
  Administered 2017-08-16: 10:00:00 via INTRAVENOUS

## 2017-08-16 MED ORDER — 0.9 % SODIUM CHLORIDE (POUR BTL) OPTIME
TOPICAL | Status: DC | PRN
  Administered 2017-08-16: 1000 mL

## 2017-08-16 MED ORDER — HYDROCODONE-ACETAMINOPHEN 5-325 MG PO TABS: 1.0000 | ORAL_TABLET | Freq: Four times a day (QID) | ORAL | 0 refills | Status: DC | PRN

## 2017-08-16 MED ORDER — PROPOFOL 500 MG/50ML IV EMUL
INTRAVENOUS | Status: DC | PRN
  Administered 2017-08-16: 100 ug/kg/min via INTRAVENOUS

## 2017-08-16 MED ORDER — DEXAMETHASONE SODIUM PHOSPHATE 10 MG/ML IJ SOLN
INTRAMUSCULAR | Status: DC | PRN
  Administered 2017-08-16: 5 mg via INTRAVENOUS

## 2017-08-16 MED ORDER — MIDAZOLAM HCL 2 MG/2ML IJ SOLN
INTRAMUSCULAR | Status: AC
  Filled 2017-08-16: qty 2

## 2017-08-16 SURGICAL SUPPLY — 34 items
ARMBAND PINK RESTRICT EXTREMIT (MISCELLANEOUS) ×4 IMPLANT
CANISTER SUCT 3000ML PPV (MISCELLANEOUS) ×2 IMPLANT
CLIP VESOCCLUDE MED 6/CT (CLIP) ×2 IMPLANT
CLIP VESOCCLUDE SM WIDE 6/CT (CLIP) ×2 IMPLANT
COVER PROBE W GEL 5X96 (DRAPES) ×2 IMPLANT
DECANTER SPIKE VIAL GLASS SM (MISCELLANEOUS) ×2 IMPLANT
DERMABOND ADVANCED (GAUZE/BANDAGES/DRESSINGS) ×1
DERMABOND ADVANCED .7 DNX12 (GAUZE/BANDAGES/DRESSINGS) ×1 IMPLANT
ELECT REM PT RETURN 9FT ADLT (ELECTROSURGICAL) ×2
ELECTRODE REM PT RTRN 9FT ADLT (ELECTROSURGICAL) ×1 IMPLANT
GLOVE BIO SURGEON STRL SZ7 (GLOVE) ×2 IMPLANT
GLOVE BIO SURGEON STRL SZ7.5 (GLOVE) ×2 IMPLANT
GLOVE BIOGEL PI IND STRL 6.5 (GLOVE) ×2 IMPLANT
GLOVE BIOGEL PI IND STRL 7.5 (GLOVE) ×1 IMPLANT
GLOVE BIOGEL PI INDICATOR 6.5 (GLOVE) ×2
GLOVE BIOGEL PI INDICATOR 7.5 (GLOVE) ×1
GOWN STRL REUS W/ TWL LRG LVL3 (GOWN DISPOSABLE) ×3 IMPLANT
GOWN STRL REUS W/ TWL XL LVL3 (GOWN DISPOSABLE) ×1 IMPLANT
GOWN STRL REUS W/TWL LRG LVL3 (GOWN DISPOSABLE) ×3
GOWN STRL REUS W/TWL XL LVL3 (GOWN DISPOSABLE) ×1
HEMOSTAT SPONGE AVITENE ULTRA (HEMOSTASIS) IMPLANT
KIT BASIN OR (CUSTOM PROCEDURE TRAY) ×2 IMPLANT
KIT TURNOVER KIT B (KITS) ×2 IMPLANT
NS IRRIG 1000ML POUR BTL (IV SOLUTION) ×2 IMPLANT
PACK CV ACCESS (CUSTOM PROCEDURE TRAY) ×2 IMPLANT
PAD ARMBOARD 7.5X6 YLW CONV (MISCELLANEOUS) ×4 IMPLANT
SUT MNCRL AB 4-0 PS2 18 (SUTURE) ×2 IMPLANT
SUT PROLENE 6 0 BV (SUTURE) ×4 IMPLANT
SUT PROLENE 7 0 BV 1 (SUTURE) ×6 IMPLANT
SUT VIC AB 3-0 SH 27 (SUTURE) ×1
SUT VIC AB 3-0 SH 27X BRD (SUTURE) ×1 IMPLANT
TOWEL GREEN STERILE (TOWEL DISPOSABLE) ×2 IMPLANT
UNDERPAD 30X30 (UNDERPADS AND DIAPERS) ×2 IMPLANT
WATER STERILE IRR 1000ML POUR (IV SOLUTION) ×2 IMPLANT

## 2017-08-16 NOTE — Anesthesia Postprocedure Evaluation (Signed)
Anesthesia Post Note  Patient: Erin Dean  Procedure(s) Performed: CREATION OF RADIOCEPHALIC VERSUS BRACHIOCEPHALIC ARTERIOVENOUS FISTULA RIGHT ARM (Right Arm Upper)     Patient location during evaluation: PACU Anesthesia Type: MAC Level of consciousness: awake and alert Pain management: pain level controlled Vital Signs Assessment: post-procedure vital signs reviewed and stable Respiratory status: spontaneous breathing, nonlabored ventilation, respiratory function stable and patient connected to nasal cannula oxygen Cardiovascular status: stable and blood pressure returned to baseline Postop Assessment: no apparent nausea or vomiting Anesthetic complications: no    Last Vitals:  Vitals:   08/16/17 1122 08/16/17 1130  BP: 126/62   Pulse: (!) 51 (!) 50  Resp: 14 16  Temp: 36.6 C   SpO2: 92% 94%    Last Pain:  Vitals:   08/16/17 1245  TempSrc:   PainSc: [place]

## 2017-08-16 NOTE — Discharge Instructions (Signed)
° °  [facility] ° °Discharge Instructions ° °AV Fistula or Graft Surgery for Dialysis Access ° °Please refer to the following instructions for your post-procedure care. Your surgeon or physician assistant will discuss any changes with you. ° °Activity ° °You may drive the day following your surgery, if you are comfortable and no longer taking prescription pain medication. Resume full activity as the soreness in your incision resolves. ° °Bathing/Showering ° °You may shower after you go home. Keep your incision dry for 48 hours. Do not soak in a bathtub, hot tub, or swim until the incision heals completely. You may not shower if you have a hemodialysis catheter. ° °Incision Care ° °Clean your incision with mild soap and water after 48 hours. Pat the area dry with a clean towel. You do not need a bandage unless otherwise instructed. Do not apply any ointments or creams to your incision. You may have skin glue on your incision. Do not peel it off. It will come off on its own in about one week. Your arm may swell a bit after surgery. To reduce swelling use pillows to elevate your arm so it is above your heart. Your doctor will tell you if you need to lightly wrap your arm with an ACE bandage. ° °Diet ° °Resume your normal diet. There are not special food restrictions following this procedure. In order to heal from your surgery, it is CRITICAL to get adequate nutrition. Your body requires vitamins, minerals, and protein. Vegetables are the best source of vitamins and minerals. Vegetables also provide the perfect balance of protein. Processed food has little nutritional value, so try to avoid this. ° °Medications ° °Resume taking all of your medications. If your incision is causing pain, you may take over-the counter pain relievers such as acetaminophen (Tylenol). If you were prescribed a stronger pain medication, please be aware these medications can cause nausea and constipation. Prevent  nausea by taking the medication with a snack or meal. Avoid constipation by drinking plenty of fluids and eating foods with high amount of fiber, such as fruits, vegetables, and grains. Do not take Tylenol if you are taking prescription pain medications. ° ° ° ° °Follow up °Your surgeon may want to see you in the office following your access surgery. If so, this will be arranged at the time of your surgery. ° °Please call us immediately for any of the following conditions: ° °Increased pain, redness, drainage (pus) from your incision site °Fever of 101 degrees or higher °Severe or worsening pain at your incision site °Hand pain or numbness. ° °Reduce your risk of vascular disease: ° °Stop smoking. If you would like help, call [facility] at 1-800-QUIT-NOW (1-800-784-8669) or [place] at 336-586-4000 ° °Manage your cholesterol °Maintain a desired weight °Control your diabetes °Keep your blood pressure down ° °Dialysis ° °It will take several weeks to several months for your new dialysis access to be ready for use. Your surgeon will determine when it is OK to use it. Your nephrologist will continue to direct your dialysis. You can continue to use your Permcath until your new access is ready for use. ° °If you have any questions, please call the office at [phone]. ° °

## 2017-08-16 NOTE — Interval H&P Note (Signed)
History and Physical Interval Note:  08/16/2017 8:26 AM  Erin Dean  has presented today for surgery, with the diagnosis of CHRONIC KIDNEY DISEASE STAGE V FOR HEMODIALYSIS ACCESS  The various methods of treatment have been discussed with the patient and family. After consideration of risks, benefits and other options for treatment, the patient has consented to  Procedure(s): CREATION OF RADIOCEPHALIC VERSUS BRACHIOCEPHALIC ARTERIOVENOUS FISTULA RIGHT ARM (Right) as a surgical intervention .  The patient's history has been reviewed, patient examined, no change in status, stable for surgery.  I have reviewed the patient's chart and labs.  Questions were answered to the patient's satisfaction.     Adele Barthel

## 2017-08-16 NOTE — Telephone Encounter (Signed)
-----   Message from Penni Homans, RN sent at 08/16/2017 11:21 AM EDT ----- Discard message forwarded from Dr. Bridgett Larsson. I apologize. We are working on this duplication! ----- Message ----- From: Iline Oven Sent: 08/16/2017  11:16 AM To: [facility] Charge Pool  Can you schedule an appt with fistula duplex in 4-6 weeks on [facility] clinic.  PO R brachiocephalic fistula. Thanks, MAtt

## 2017-08-16 NOTE — Transfer of Care (Signed)
Immediate Anesthesia Transfer of Care Note  Patient: Erin Dean  Procedure(s) Performed: CREATION OF RADIOCEPHALIC VERSUS BRACHIOCEPHALIC ARTERIOVENOUS FISTULA RIGHT ARM (Right Arm Upper)  Patient Location: PACU  Anesthesia Type:MAC  Level of Consciousness: awake, alert , oriented and patient cooperative  Airway & Oxygen Therapy: Patient Spontanous Breathing  Post-op Assessment: Report given to RN, Post -op Vital signs reviewed and stable and Patient moving all extremities X 4  Post vital signs: Reviewed and stable  Last Vitals:  Vitals Value Taken Time  BP 126/62 08/16/2017 11:24 AM  Temp    Pulse 51 08/16/2017 11:27 AM  Resp 14 08/16/2017 11:27 AM  SpO2 94 % 08/16/2017 11:27 AM  Vitals shown include unvalidated device data.  Last Pain:  Vitals:   08/16/17 1122  TempSrc:   PainSc: (P) 0-No pain         Complications: No apparent anesthesia complications

## 2017-08-16 NOTE — Progress Notes (Signed)
Orthopedic Tech Progress Note Patient Details:  Erin Dean Feb 21, 1966 [id number]  Ortho Devices Type of Ortho Device: Arm sling Ortho Device/Splint Interventions: Application   Post Interventions Patient Tolerated: Well Instructions Provided: Care of device, Adjustment of device   [place] Pink 08/16/2017, 2:13 PM

## 2017-08-16 NOTE — Telephone Encounter (Signed)
Sched lab 09/20/17 at 3:30 and PA 09/25/17 at 1:30. Spoke to pt and sister to inform them of appts.

## 2017-08-16 NOTE — Op Note (Signed)
OPERATIVE NOTE   PROCEDURE: right brachiocephalic arteriovenous fistula placement  PRE-OPERATIVE DIAGNOSIS: chronic kidney disease stage V  POST-OPERATIVE DIAGNOSIS: same as above   SURGEON: Adele Barthel, MD  ASSISTANT(S): Dagoberto Ligas, PAC   ANESTHESIA: local and MAC  ESTIMATED BLOOD LOSS: 50 cc  FINDING(S): 1.  Cephalic vein: 4 mm, acceptable 2.  Brachial artery: 3 mm, thickened wall 3.  Venous outflow: palpable thrill  4.  Radial flow: dopplerable radial signal  SPECIMEN(S):  none  INDICATIONS:   Erin Dean is a 52 y.o. female who presents with chronic kidney disease stage V.  The patient is scheduled for right brachiocephalic arteriovenous fistula placement.  The patient is aware the risks include but are not limited to: bleeding, infection, steal syndrome, nerve damage, ischemic monomelic neuropathy, failure to mature, and need for additional procedures.  The patient is aware of the risks of the procedure and elects to proceed forward.   DESCRIPTION: After full informed written consent was obtained from the patient, the patient was brought back to the operating room and placed supine upon the operating table.  Prior to induction, the patient received IV antibiotics.   After obtaining adequate anesthesia, the patient was then prepped and draped in the standard fashion for a right arm access procedure.  I turned my attention first to identifying the patient's cephalic vein and brachial artery.  Using SonoSite guidance, the location of these vessels were marked out on the skin.     At this point, I injected local anesthetic to obtain a field block of the antecubitum.  In total, I injected about 5 mL of 1% lidocaine without epinephrine.  I made a transverse incision at the level of the antecubitum and dissected through the subcutaneous tissue and fascia to gain exposure of the brachial artery.  This was noted to be 3 mm in diameter externally.  This was dissected out  proximally and distally and controlled with vessel loops .  I then dissected out the cephalic vein.  This was noted to be 4 mm in diameter externally.  The distal segment of the vein was ligated with a  2-0 silk, and the vein was transected.  The proximal segment was interrogated with serial dilators.  The vein accepted up to a 5 mm dilator without any difficulty.  I then instilled the heparinized saline into the vein and clamped it.  At this point, I reset my exposure of the brachial artery and placed the artery under tension proximally and distally.  I made an arteriotomy with a #11 blade, and then I extended the arteriotomy with a Potts scissor.  I injected heparinized saline proximal and distal to this arteriotomy.  The vein was then sewn to the artery in an end-to-side configuration with a running stitch of 7-0 Prolene.  Prior to completing this anastomosis, I allowed the vein and artery to backbleed.  There was no evidence of clot from any vessels.  I completed the anastomosis in the usual fashion and then released all vessel loops and clamps.    There was a palpable thrill in the venous outflow, and there was a dopplerable radial signal.  At this point, I irrigated out the surgical wound.  There was no further active bleeding.  The subcutaneous tissue was reapproximated with a running stitch of 3-0 Vicryl.  The skin was then reapproximated with a running subcuticular stitch of 4-0 Vicryl.  The skin was then cleaned, dried, and reinforced with Dermabond.  The patient tolerated  this procedure well.    COMPLICATIONS: none  CONDITION: stable   Adele Barthel, MD, [facility] [place] Office: [phone] Pager: [phone]  08/16/2017, 11:08 AM

## 2017-08-17 ENCOUNTER — Encounter (HOSPITAL_COMMUNITY): Payer: Self-pay | Admitting: Vascular Surgery

## 2017-08-19 ENCOUNTER — Other Ambulatory Visit: Payer: Self-pay

## 2017-08-19 DIAGNOSIS — Z48812 Encounter for surgical aftercare following surgery on the circulatory system: Secondary | ICD-10-CM

## 2017-08-19 DIAGNOSIS — N185 Chronic kidney disease, stage 5: Secondary | ICD-10-CM

## 2017-09-20 ENCOUNTER — Ambulatory Visit (HOSPITAL_COMMUNITY)
Admission: RE | Admit: 2017-09-20 | Discharge: 2017-09-20 | Disposition: A | Discharge status: Home / Self Care | Payer: BLUE CROSS/BLUE SHIELD | Source: Ambulatory Visit | Attending: Family | Admitting: Family

## 2017-09-20 DIAGNOSIS — Z48812 Encounter for surgical aftercare following surgery on the circulatory system: Secondary | ICD-10-CM | POA: Insufficient documentation

## 2017-09-20 DIAGNOSIS — N185 Chronic kidney disease, stage 5: Secondary | ICD-10-CM

## 2017-09-25 ENCOUNTER — Encounter: Payer: Self-pay | Admitting: *Deleted

## 2017-09-25 ENCOUNTER — Encounter: Payer: Self-pay | Admitting: Vascular Surgery

## 2017-09-25 ENCOUNTER — Other Ambulatory Visit: Payer: Self-pay | Admitting: *Deleted

## 2017-09-25 ENCOUNTER — Other Ambulatory Visit: Payer: Self-pay

## 2017-09-25 ENCOUNTER — Ambulatory Visit (INDEPENDENT_AMBULATORY_CARE_PROVIDER_SITE_OTHER): Payer: Self-pay | Admitting: Physician Assistant

## 2017-09-25 VITALS — BP 168/88 | HR 57 | Temp 97.4°F | Resp 18 | Ht 67.5 in | Wt 266.1 lb

## 2017-09-25 DIAGNOSIS — N185 Chronic kidney disease, stage 5: Secondary | ICD-10-CM

## 2017-09-25 NOTE — H&P (View-Only) (Signed)
POST OPERATIVE OFFICE NOTE    CC:  F/u for surgery  HPI:  This is a 52 y.o. 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   She is here today for a post op check s/p right brachiocephalic arteriovenous fistula placement.  She denise pain, weakness and loss of sensation in the right UE.       No Known Allergies  Current Outpatient Medications  Medication Sig Dispense Refill  . amLODipine (NORVASC) 10 MG tablet Take 1 tablet (10 mg total) by mouth daily. 180 tablet 3  . aspirin EC 325 MG EC tablet Take 1 tablet (325 mg total) by mouth daily. 30 tablet 0  . atorvastatin (LIPITOR) 40 MG tablet Take 1 tablet (40 mg total) by mouth daily at 6 PM. 30 tablet 1  . carvedilol (COREG) 12.5 MG tablet Take 1 tablet (12.5 mg total) by mouth 2 (two) times daily. 30 tablet 1  . cloNIDine (CATAPRES) 0.1 MG tablet Take 1 tablet (0.1 mg total) by mouth 3 (three) times daily. 60 tablet 11  . escitalopram (LEXAPRO) 10 MG tablet Take 10 mg by mouth daily.    . ferrous sulfate 325 (65 FE) MG tablet Take 1 tablet (325 mg total) by mouth 3 (three) times daily with meals. 90 tablet 3  . furosemide (LASIX) 80 MG tablet Take 1 tablet (80 mg total) by mouth daily. 30 tablet 1  . hydrALAZINE (APRESOLINE) 100 MG tablet Take 1 tablet (100 mg total) by mouth 3 (three) times daily. 30 tablet 1  . HYDROcodone-acetaminophen (NORCO) 5-325 MG tablet Take 1 tablet by mouth every 6 (six) hours as needed for moderate pain. 8 tablet 0  . pantoprazole (PROTONIX) 40 MG tablet Take 1 tablet (40 mg total) by mouth daily. 30 tablet 1  . vitamin B-12 1000 MCG tablet Take 1 tablet (1,000 mcg total) by mouth daily. 30 tablet 1   No current facility-administered medications for this visit.      ROS:  See HPI  Physical Exam:  Vitals:   09/25/17 1355 09/25/17 1357  BP: (!) 167/81 (!) 168/88  Pulse: (!) 57   Resp: 18   Temp: (!) 97.4 F (36.3 C)   SpO2: 95%     Incision:  Well healed left arm incision Extremities:   Sensation and motor intact equal B UE.  Palpable thrill at the anastomosis.  Palpable DP B with moderate B LE pitting edema. Heart RRR Abdomen:  Soft NTTTP  Fistula duplex 09/20/2017 +------------+----------+-------------+----------+--------+ OUTFLOW VEINPSV (cm/s)Diameter (cm)Depth (cm)Describe +------------+----------+-------------+----------+--------+ Prox UA     129    0.74     1.86       +------------+----------+-------------+----------+--------+ Mid UA     112    0.87     1.46       +------------+----------+-------------+----------+--------+ Dist UA     121    0.80     0.98       +------------+----------+-------------+----------+--------+ AC Fossa    340    0.88     0.82       +------------+----------+-------------+----------+--------+  Assessment/Plan:  This is a 52 y.o. female who is s/p: Right brachial cephalic av fistula creation. Stage V CKD not yet on HD  The diameter of the fistula has matured very well and is larger than 6 mm, how ever the depth of the fistula will make it hard to stick for HD in the future.  My recommendation will be to schedule her for superficialization of  the right AV fistula in the near future.  She will be scheduled for 10/01/2017 with Dr. Bridgett Larsson.  Roxy Horseman,  PA-C  [facility] [phone]  Clinic MD:  Bridgett Larsson

## 2017-09-25 NOTE — Progress Notes (Signed)
POST OPERATIVE OFFICE NOTE    CC:  F/u for surgery  HPI:  This is a 52 y.o. 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   She is here today for a post op check s/p right brachiocephalic arteriovenous fistula placement.  She denise pain, weakness and loss of sensation in the right UE.       No Known Allergies  Current Outpatient Medications  Medication Sig Dispense Refill  . amLODipine (NORVASC) 10 MG tablet Take 1 tablet (10 mg total) by mouth daily. 180 tablet 3  . aspirin EC 325 MG EC tablet Take 1 tablet (325 mg total) by mouth daily. 30 tablet 0  . atorvastatin (LIPITOR) 40 MG tablet Take 1 tablet (40 mg total) by mouth daily at 6 PM. 30 tablet 1  . carvedilol (COREG) 12.5 MG tablet Take 1 tablet (12.5 mg total) by mouth 2 (two) times daily. 30 tablet 1  . cloNIDine (CATAPRES) 0.1 MG tablet Take 1 tablet (0.1 mg total) by mouth 3 (three) times daily. 60 tablet 11  . escitalopram (LEXAPRO) 10 MG tablet Take 10 mg by mouth daily.    . ferrous sulfate 325 (65 FE) MG tablet Take 1 tablet (325 mg total) by mouth 3 (three) times daily with meals. 90 tablet 3  . furosemide (LASIX) 80 MG tablet Take 1 tablet (80 mg total) by mouth daily. 30 tablet 1  . hydrALAZINE (APRESOLINE) 100 MG tablet Take 1 tablet (100 mg total) by mouth 3 (three) times daily. 30 tablet 1  . HYDROcodone-acetaminophen (NORCO) 5-325 MG tablet Take 1 tablet by mouth every 6 (six) hours as needed for moderate pain. 8 tablet 0  . pantoprazole (PROTONIX) 40 MG tablet Take 1 tablet (40 mg total) by mouth daily. 30 tablet 1  . vitamin B-12 1000 MCG tablet Take 1 tablet (1,000 mcg total) by mouth daily. 30 tablet 1   No current facility-administered medications for this visit.      ROS:  See HPI  Physical Exam:  Vitals:   09/25/17 1355 09/25/17 1357  BP: (!) 167/81 (!) 168/88  Pulse: (!) 57   Resp: 18   Temp: (!) 97.4 F (36.3 C)   SpO2: 95%     Incision:  Well healed left arm incision Extremities:   Sensation and motor intact equal B UE.  Palpable thrill at the anastomosis.  Palpable DP B with moderate B LE pitting edema. Heart RRR Abdomen:  Soft NTTTP  Fistula duplex 09/20/2017 +------------+----------+-------------+----------+--------+ OUTFLOW VEINPSV (cm/s)Diameter (cm)Depth (cm)Describe +------------+----------+-------------+----------+--------+ Prox UA     129    0.74     1.86       +------------+----------+-------------+----------+--------+ Mid UA     112    0.87     1.46       +------------+----------+-------------+----------+--------+ Dist UA     121    0.80     0.98       +------------+----------+-------------+----------+--------+ AC Fossa    340    0.88     0.82       +------------+----------+-------------+----------+--------+  Assessment/Plan:  This is a 52 y.o. female who is s/p: Right brachial cephalic av fistula creation. Stage V CKD not yet on HD  The diameter of the fistula has matured very well and is larger than 6 mm, how ever the depth of the fistula will make it hard to stick for HD in the future.  My recommendation will be to schedule her for superficialization of  the right AV fistula in the near future.  She will be scheduled for 10/01/2017 with Dr. Bridgett Larsson.  Roxy Horseman,  PA-C  [facility] [phone]  Clinic MD:  Bridgett Larsson

## 2017-09-30 ENCOUNTER — Encounter (HOSPITAL_COMMUNITY): Payer: Self-pay | Admitting: *Deleted

## 2017-09-30 ENCOUNTER — Other Ambulatory Visit: Payer: Self-pay

## 2017-09-30 MED ORDER — DEXTROSE 5 % IV SOLN
3.0000 g | INTRAVENOUS | Status: AC
  Administered 2017-10-01: 3 g via INTRAVENOUS
  Filled 2017-09-30: qty 3

## 2017-10-01 ENCOUNTER — Ambulatory Visit (HOSPITAL_COMMUNITY): Payer: BLUE CROSS/BLUE SHIELD | Admitting: Anesthesiology

## 2017-10-01 ENCOUNTER — Ambulatory Visit (HOSPITAL_COMMUNITY)
Admission: RE | Admit: 2017-10-01 | Discharge: 2017-10-01 | Disposition: A | Discharge status: Home / Self Care | Payer: BLUE CROSS/BLUE SHIELD | Source: Ambulatory Visit | Attending: Vascular Surgery | Admitting: Vascular Surgery

## 2017-10-01 ENCOUNTER — Encounter (HOSPITAL_COMMUNITY): Payer: Self-pay | Admitting: Certified Registered"

## 2017-10-01 ENCOUNTER — Encounter (HOSPITAL_COMMUNITY)
Admission: RE | Disposition: A | Discharge status: Home / Self Care | Payer: Self-pay | Source: Ambulatory Visit | Attending: Vascular Surgery

## 2017-10-01 DIAGNOSIS — T82898A Other specified complication of vascular prosthetic devices, implants and grafts, initial encounter: Secondary | ICD-10-CM | POA: Insufficient documentation

## 2017-10-01 DIAGNOSIS — Z79899 Other long term (current) drug therapy: Secondary | ICD-10-CM | POA: Insufficient documentation

## 2017-10-01 DIAGNOSIS — Y832 Surgical operation with anastomosis, bypass or graft as the cause of abnormal reaction of the patient, or of later complication, without mention of misadventure at the time of the procedure: Secondary | ICD-10-CM | POA: Diagnosis not present

## 2017-10-01 DIAGNOSIS — N185 Chronic kidney disease, stage 5: Secondary | ICD-10-CM | POA: Diagnosis not present

## 2017-10-01 DIAGNOSIS — Z7982 Long term (current) use of aspirin: Secondary | ICD-10-CM | POA: Insufficient documentation

## 2017-10-01 HISTORY — PX: FISTULA SUPERFICIALIZATION: SHX6341

## 2017-10-01 LAB — POCT I-STAT 4, (NA,K, GLUC, HGB,HCT)
GLUCOSE: 109 mg/dL — AB (ref 70–99)
HEMATOCRIT: 23 % — AB (ref 36.0–46.0)
Hemoglobin: 7.8 g/dL — ABNORMAL LOW (ref 12.0–15.0)
POTASSIUM: 3.8 mmol/L (ref 3.5–5.1)
Sodium: 141 mmol/L (ref 135–145)

## 2017-10-01 LAB — POCT PREGNANCY, URINE: Preg Test, Ur: NEGATIVE

## 2017-10-01 SURGERY — FISTULA SUPERFICIALIZATION
Anesthesia: Monitor Anesthesia Care | Site: Arm Upper | Laterality: Right

## 2017-10-01 MED ORDER — CHLORHEXIDINE GLUCONATE 4 % EX LIQD: 60.0000 mL | Freq: Once | CUTANEOUS | Status: DC

## 2017-10-01 MED ORDER — LIDOCAINE 2% (20 MG/ML) 5 ML SYRINGE
INTRAMUSCULAR | Status: DC | PRN
  Administered 2017-10-01: 40 mg via INTRAVENOUS

## 2017-10-01 MED ORDER — SUCCINYLCHOLINE CHLORIDE 20 MG/ML IJ SOLN
INTRAMUSCULAR | Status: AC
  Filled 2017-10-01: qty 1

## 2017-10-01 MED ORDER — 0.9 % SODIUM CHLORIDE (POUR BTL) OPTIME
TOPICAL | Status: DC | PRN
  Administered 2017-10-01: 1000 mL

## 2017-10-01 MED ORDER — EPHEDRINE SULFATE 50 MG/ML IJ SOLN
INTRAMUSCULAR | Status: AC
  Filled 2017-10-01: qty 1

## 2017-10-01 MED ORDER — ONDANSETRON HCL 4 MG/2ML IJ SOLN
INTRAMUSCULAR | Status: DC | PRN
  Administered 2017-10-01: 4 mg via INTRAVENOUS

## 2017-10-01 MED ORDER — PHENYLEPHRINE 40 MCG/ML (10ML) SYRINGE FOR IV PUSH (FOR BLOOD PRESSURE SUPPORT)
PREFILLED_SYRINGE | INTRAVENOUS | Status: AC
  Filled 2017-10-01: qty 10

## 2017-10-01 MED ORDER — LIDOCAINE HCL (PF) 1 % IJ SOLN
INTRAMUSCULAR | Status: AC
  Filled 2017-10-01: qty 30

## 2017-10-01 MED ORDER — SODIUM CHLORIDE 0.9 % IV SOLN
INTRAVENOUS | Status: DC | PRN
  Administered 2017-10-01: 07:00:00 via INTRAVENOUS

## 2017-10-01 MED ORDER — OXYCODONE HCL 5 MG/5ML PO SOLN: 5.0000 mg | Freq: Once | ORAL | Status: AC | PRN

## 2017-10-01 MED ORDER — PROPOFOL 10 MG/ML IV BOLUS
INTRAVENOUS | Status: AC
  Filled 2017-10-01: qty 20

## 2017-10-01 MED ORDER — LIDOCAINE HCL (PF) 1 % IJ SOLN
INTRAMUSCULAR | Status: DC | PRN
  Administered 2017-10-01: 10 mL

## 2017-10-01 MED ORDER — MIDAZOLAM HCL 2 MG/2ML IJ SOLN
INTRAMUSCULAR | Status: AC
  Filled 2017-10-01: qty 2

## 2017-10-01 MED ORDER — SODIUM CHLORIDE 0.9 % IV SOLN
INTRAVENOUS | Status: DC | PRN
  Administered 2017-10-01: 500 mL

## 2017-10-01 MED ORDER — FENTANYL CITRATE (PF) 100 MCG/2ML IJ SOLN: 25.0000 ug | INTRAMUSCULAR | Status: DC | PRN

## 2017-10-01 MED ORDER — SODIUM CHLORIDE 0.9 % IJ SOLN
INTRAMUSCULAR | Status: AC
  Filled 2017-10-01: qty 10

## 2017-10-01 MED ORDER — SODIUM CHLORIDE 0.9 % IV SOLN
INTRAVENOUS | Status: AC
  Filled 2017-10-01: qty 1.2

## 2017-10-01 MED ORDER — FENTANYL CITRATE (PF) 250 MCG/5ML IJ SOLN
INTRAMUSCULAR | Status: DC | PRN
  Administered 2017-10-01: 50 ug via INTRAVENOUS

## 2017-10-01 MED ORDER — HYDROCODONE-ACETAMINOPHEN 5-325 MG PO TABS: 1.0000 | ORAL_TABLET | Freq: Four times a day (QID) | ORAL | 0 refills | Status: DC | PRN

## 2017-10-01 MED ORDER — PROPOFOL 500 MG/50ML IV EMUL
INTRAVENOUS | Status: DC | PRN
  Administered 2017-10-01: 75 ug/kg/min via INTRAVENOUS

## 2017-10-01 MED ORDER — OXYCODONE HCL 5 MG PO TABS
5.0000 mg | ORAL_TABLET | Freq: Once | ORAL | Status: AC | PRN
  Administered 2017-10-01: 5 mg via ORAL

## 2017-10-01 MED ORDER — OXYCODONE HCL 5 MG PO TABS
ORAL_TABLET | ORAL | Status: AC
  Filled 2017-10-01: qty 1

## 2017-10-01 MED ORDER — MIDAZOLAM HCL 5 MG/5ML IJ SOLN
INTRAMUSCULAR | Status: DC | PRN
  Administered 2017-10-01: 2 mg via INTRAVENOUS

## 2017-10-01 MED ORDER — SODIUM CHLORIDE 0.9 % IV SOLN: INTRAVENOUS | Status: DC

## 2017-10-01 MED ORDER — FENTANYL CITRATE (PF) 250 MCG/5ML IJ SOLN
INTRAMUSCULAR | Status: AC
  Filled 2017-10-01: qty 5

## 2017-10-01 MED ORDER — LIDOCAINE 2% (20 MG/ML) 5 ML SYRINGE
INTRAMUSCULAR | Status: AC
  Filled 2017-10-01: qty 5

## 2017-10-01 MED ORDER — HEMOSTATIC AGENTS (NO CHARGE) OPTIME
TOPICAL | Status: DC | PRN
  Administered 2017-10-01: 1 via TOPICAL

## 2017-10-01 SURGICAL SUPPLY — 38 items
ARMBAND PINK RESTRICT EXTREMIT (MISCELLANEOUS) ×2 IMPLANT
CANISTER SUCT 3000ML PPV (MISCELLANEOUS) ×2 IMPLANT
CLIP VESOCCLUDE MED 6/CT (CLIP) ×2 IMPLANT
CLIP VESOCCLUDE SM WIDE 6/CT (CLIP) ×2 IMPLANT
COVER PROBE W GEL 5X96 (DRAPES) ×2 IMPLANT
DECANTER SPIKE VIAL GLASS SM (MISCELLANEOUS) ×2 IMPLANT
DERMABOND ADVANCED (GAUZE/BANDAGES/DRESSINGS) ×1
DERMABOND ADVANCED .7 DNX12 (GAUZE/BANDAGES/DRESSINGS) ×1 IMPLANT
DRAIN PENROSE 1/2X12 LTX STRL (WOUND CARE) IMPLANT
ELECT CAUTERY BLADE 6.4 (BLADE) ×2 IMPLANT
ELECT REM PT RETURN 9FT ADLT (ELECTROSURGICAL) ×2
ELECTRODE REM PT RTRN 9FT ADLT (ELECTROSURGICAL) ×1 IMPLANT
GAUZE SPONGE 4X4 16PLY XRAY LF (GAUZE/BANDAGES/DRESSINGS) ×2 IMPLANT
GLOVE BIO SURGEON STRL SZ 6.5 (GLOVE) ×4 IMPLANT
GLOVE BIO SURGEON STRL SZ7 (GLOVE) ×2 IMPLANT
GLOVE BIOGEL M 6.5 STRL (GLOVE) ×4 IMPLANT
GLOVE BIOGEL PI IND STRL 6.5 (GLOVE) ×4 IMPLANT
GLOVE BIOGEL PI IND STRL 7.5 (GLOVE) ×1 IMPLANT
GLOVE BIOGEL PI INDICATOR 6.5 (GLOVE) ×4
GLOVE BIOGEL PI INDICATOR 7.5 (GLOVE) ×1
GOWN STRL REUS W/ TWL LRG LVL3 (GOWN DISPOSABLE) ×4 IMPLANT
GOWN STRL REUS W/TWL LRG LVL3 (GOWN DISPOSABLE) ×4
HEMOSTAT SPONGE AVITENE ULTRA (HEMOSTASIS) ×2 IMPLANT
KIT BASIN OR (CUSTOM PROCEDURE TRAY) ×2 IMPLANT
KIT TURNOVER KIT B (KITS) ×2 IMPLANT
NS IRRIG 1000ML POUR BTL (IV SOLUTION) ×2 IMPLANT
PACK CV ACCESS (CUSTOM PROCEDURE TRAY) ×2 IMPLANT
PAD ARMBOARD 7.5X6 YLW CONV (MISCELLANEOUS) ×4 IMPLANT
SUT MNCRL AB 4-0 PS2 18 (SUTURE) ×2 IMPLANT
SUT PROLENE 6 0 BV (SUTURE) ×2 IMPLANT
SUT PROLENE 7 0 BV 1 (SUTURE) ×2 IMPLANT
SUT VIC AB 2-0 CT1 27 (SUTURE) ×1
SUT VIC AB 2-0 CT1 TAPERPNT 27 (SUTURE) ×1 IMPLANT
SUT VIC AB 3-0 SH 27 (SUTURE) ×2
SUT VIC AB 3-0 SH 27X BRD (SUTURE) ×2 IMPLANT
TOWEL GREEN STERILE (TOWEL DISPOSABLE) ×2 IMPLANT
UNDERPAD 30X30 (UNDERPADS AND DIAPERS) ×2 IMPLANT
WATER STERILE IRR 1000ML POUR (IV SOLUTION) ×2 IMPLANT

## 2017-10-01 NOTE — Interval H&P Note (Signed)
History and Physical Interval Note:  10/01/2017 6:55 AM  Erin Dean  has presented today for surgery, with the diagnosis of COMPLICATION WITH RIGHT ARM A/V FISTULA  The various methods of treatment have been discussed with the patient and family. After consideration of risks, benefits and other options for treatment, the patient has consented to  Procedure(s): FISTULA SUPERFICIALIZATION RIGHT ARM (Right) as a surgical intervention .  The patient's history has been reviewed, patient examined, no change in status, stable for surgery.  I have reviewed the patient's chart and labs.  Questions were answered to the patient's satisfaction.     Adele Barthel

## 2017-10-01 NOTE — Op Note (Signed)
    OPERATIVE NOTE   PROCEDURE: 1. Superficialization of right brachiocephalic arteriovenous fistula 2. Side branch ligation x 4  PRE-OPERATIVE DIAGNOSIS: deep matured right brachiocephalic arteriovenous fistula   POST-OPERATIVE DIAGNOSIS: same as above   SURGEON: Adele Barthel, MD  ASSISTANT(S): Leontine Locket, PAC   ANESTHESIA: local and MAC  ESTIMATED BLOOD LOSS: 50 cc  FINDING(S): 1. Easily palpable thrill in transposed fistula 2. 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 female who  presents with deep right brachiocephalic arteriovenous fistula.  We discussed that superficialization of the fistula was necessary to facilitate future cannulation of the fistula.  In the process, ligation of side branches would be completed.  Risk, benefits, and alternatives to access surgery were discussed.  The patient is aware the risks include but are not limited to: bleeding, infection, steal syndrome, nerve damage, ischemic monomelic neuropathy, failure to mature, need for additional procedures, death and stroke.  The patient agrees to proceed forward with the procedure.  DESCRIPTION: After obtain full informed written consent, the patient was brought back to the operating room and placed supine upon the operating table.  The patient received IV antibiotics prior to induction.  After obtaining adequate anesthesia, the patient was prepped and draped in the standard fashion for: right access procedure.  The brachiocephalic arteriovenous fistula was identified with the Sonosite and marked on the arm.  I made a longitudinal incision over the fistula and dissected down to the fistula.  I mobilized the fistula from the anastomosis to the axillary extent of the fistula, in the process ligating side branches with silk ties.  The fistula appeared: >6 mm except for a <30% stenosis in the proximal 1/3  of fistula.   I sharply lysed connective tissue surrounding the fistula.  I could hydrodistend the fistula to >5 mm.  I made a shelf in the adjacent subcutaneous tissue that was ~6 mm deep from the skin.  I transposed the fistula onto this shelf and secured the fistula in placed with interrupted 3-0 Vicryl stitches, taking care to avoid constricting the fistula.  The deep subcutaneous tissue was reapproximated with interrupted 3-0 Vicryl to abolish some of the dead space.  A running subcutaneous stitch of 3-0 Vicryl was used to reapproximated the superficial subcutaneous tissue.  The skin was then reapproximated with a running subcuticular 4-0 Monocryl.  The skin was cleaned, dried, and the skin closure reinforced with Dermabond.  There was an easily palpable thrill at the end of this case.  COMPLICATIONS: none  CONDITION: stable  Adele Barthel, MD [facility] Office: [phone] Pager: [phone]  10/01/2017, 8:46 AM

## 2017-10-01 NOTE — Transfer of Care (Signed)
Immediate Anesthesia Transfer of Care Note  Patient: Erin Dean  Procedure(s) Performed: FISTULA SUPERFICIALIZATION RIGHT ARM (Right Arm Upper)  Patient Location: PACU  Anesthesia Type:MAC  Level of Consciousness: awake, alert , oriented and patient cooperative  Airway & Oxygen Therapy: Patient Spontanous Breathing and Patient connected to nasal cannula oxygen  Post-op Assessment: Report given to RN, Post -op Vital signs reviewed and stable and Patient moving all extremities  Post vital signs: Reviewed and stable  Last Vitals:  Vitals Value Taken Time  BP 84/39 10/01/2017  8:53 AM  Temp    Pulse 53 10/01/2017  8:55 AM  Resp 17 10/01/2017  8:55 AM  SpO2 99 % 10/01/2017  8:55 AM  Vitals shown include unvalidated device data.  Last Pain:  Vitals:   10/01/17 0602  TempSrc: Oral         Complications: No apparent anesthesia complications

## 2017-10-01 NOTE — Anesthesia Procedure Notes (Signed)
Date/Time: 10/01/2017 7:45 AM Performed by: Myna Bright, CRNA Pre-anesthesia Checklist: Patient identified, Emergency Drugs available, Suction available and Patient being monitored Patient Re-evaluated:Patient Re-evaluated prior to induction Oxygen Delivery Method: Simple face mask Airway Equipment and Method: Oral airway Placement Confirmation: positive ETCO2

## 2017-10-01 NOTE — Discharge Instructions (Signed)
° °  [facility]  Discharge Instructions  AV Fistula or Graft Surgery for Dialysis Access  Please refer to the following instructions for your post-procedure care. Your surgeon or physician assistant will discuss any changes with you.  Activity  You may drive the day following your surgery, if you are comfortable and no longer taking prescription pain medication. Resume full activity as the soreness in your incision resolves.  Bathing/Showering  You may shower after you go home. Keep your incision dry for 48 hours. Do not soak in a bathtub, hot tub, or swim until the incision heals completely. You may not shower if you have a hemodialysis catheter.  Incision Care  Clean your incision with mild soap and water after 48 hours. Pat the area dry with a clean towel. You do not need a bandage unless otherwise instructed. Do not apply any ointments or creams to your incision. You may have skin glue on your incision. Do not peel it off. It will come off on its own in about one week. Your arm may swell a bit after surgery. To reduce swelling use pillows to elevate your arm so it is above your heart. Your doctor will tell you if you need to lightly wrap your arm with an ACE bandage.  Diet  Resume your normal diet. There are not special food restrictions following this procedure. In order to heal from your surgery, it is CRITICAL to get adequate nutrition. Your body requires vitamins, minerals, and protein. Vegetables are the best source of vitamins and minerals. Vegetables also provide the perfect balance of protein. Processed food has little nutritional value, so try to avoid this.  Medications  Resume taking all of your medications. If your incision is causing pain, you may take over-the counter pain relievers such as acetaminophen (Tylenol). If you were prescribed a stronger pain medication, please be aware these medications can cause nausea and constipation. Prevent  nausea by taking the medication with a snack or meal. Avoid constipation by drinking plenty of fluids and eating foods with high amount of fiber, such as fruits, vegetables, and grains.  Do not take Tylenol if you are taking prescription pain medications.  Follow up Your surgeon may want to see you in the office following your access surgery. If so, this will be arranged at the time of your surgery.  Please call us immediately for any of the following conditions:  Increased pain, redness, drainage (pus) from your incision site Fever of 101 degrees or higher Severe or worsening pain at your incision site Hand pain or numbness.  Reduce your risk of vascular disease:  Stop smoking. If you would like help, call [facility] at 1-800-QUIT-NOW (787)459-1389) or [facility] [place] your cholesterol Maintain a desired weight Control your diabetes Keep your blood pressure down  Dialysis  It will take several weeks to several months for your new dialysis access to be ready for use. Your surgeon will determine when it is okay to use it. Your nephrologist will continue to direct your dialysis. You can continue to use your Permcath until your new access is ready for use.   10/01/2017 Erin Dean [id number] 1965-12-11  Surgeon(s): Conrad Snook, MD  Procedure(s): FISTULA SUPERFICIALIZATION RIGHT ARM  x Do not stick fistula for 6 weeks    If you have any questions, please call the office at [phone].

## 2017-10-01 NOTE — Anesthesia Preprocedure Evaluation (Signed)
Anesthesia Evaluation  Patient identified by MRN, date of birth, ID band Patient awake    Reviewed: Allergy & Precautions, NPO status , Patient's Chart, lab work & pertinent test results  History of Anesthesia Complications Negative for: history of anesthetic complications  Airway Mallampati: II  TM Distance: >3 FB Neck ROM: Full    Dental no notable dental hx. (+) Teeth Intact, Dental Advisory Given   Pulmonary neg pulmonary ROS,    Pulmonary exam normal breath sounds clear to auscultation       Cardiovascular hypertension, Normal cardiovascular exam Rhythm:Regular Rate:Normal  8/84/16 Echo Systolic function was normal. The   estimated ejection fraction was in the range of 55% to 60%. Wall   motion was normal;   Neuro/Psych CVA negative psych ROS   GI/Hepatic negative GI ROS, Neg liver ROS,   Endo/Other  negative endocrine ROSMorbid obesity  Renal/GU   negative genitourinary   Musculoskeletal negative musculoskeletal ROS (+)   Abdominal   Peds negative pediatric ROS (+)  Hematology negative hematology ROS (+)   Anesthesia Other Findings   Reproductive/Obstetrics negative OB ROS                             Anesthesia Physical Anesthesia Plan  ASA: III  Anesthesia Plan: MAC   Post-op Pain Management:    Induction: Intravenous  PONV Risk Score and Plan: 2 and Ondansetron  Airway Management Planned: Nasal Cannula  Additional Equipment: None  Intra-op Plan:   Post-operative Plan:   Informed Consent: I have reviewed the patients History and Physical, chart, labs and discussed the procedure including the risks, benefits and alternatives for the proposed anesthesia with the patient or authorized representative who has indicated his/her understanding and acceptance.   Dental advisory given  Plan Discussed with: CRNA and Surgeon  Anesthesia Plan Comments:          Anesthesia Quick Evaluation

## 2017-10-02 ENCOUNTER — Encounter (HOSPITAL_COMMUNITY): Payer: Self-pay | Admitting: Vascular Surgery

## 2017-10-02 ENCOUNTER — Telehealth: Payer: Self-pay | Admitting: Vascular Surgery

## 2017-10-02 NOTE — Telephone Encounter (Signed)
sch appt phone NA 10/30/17 130pm p/o PA

## 2017-10-04 NOTE — Anesthesia Postprocedure Evaluation (Signed)
Anesthesia Post Note  Patient: Erin Dean  Procedure(s) Performed: FISTULA SUPERFICIALIZATION RIGHT ARM (Right Arm Upper)     Patient location during evaluation: PACU Anesthesia Type: MAC Level of consciousness: awake and alert Pain management: pain level controlled Vital Signs Assessment: post-procedure vital signs reviewed and stable Respiratory status: spontaneous breathing, nonlabored ventilation, respiratory function stable and patient connected to nasal cannula oxygen Cardiovascular status: stable and blood pressure returned to baseline Postop Assessment: no apparent nausea or vomiting Anesthetic complications: no    Last Vitals:  Vitals:   10/01/17 0947 10/01/17 1000  BP:  115/60  Pulse:  (!) 50  Resp:    Temp: (!) 36.4 C   SpO2:  99%    Last Pain:  Vitals:   10/01/17 0920  TempSrc:   PainSc: Asleep                 Concetta Guion

## 2017-10-30 ENCOUNTER — Encounter: Payer: BLUE CROSS/BLUE SHIELD | Admitting: Physician Assistant

## 2017-11-14 ENCOUNTER — Telehealth: Payer: Self-pay | Admitting: Diagnostic Neuroimaging

## 2017-11-14 ENCOUNTER — Encounter (HOSPITAL_COMMUNITY)
Admission: RE | Admit: 2017-11-14 | Discharge: 2017-11-14 | Disposition: A | Discharge status: Home / Self Care | Payer: BLUE CROSS/BLUE SHIELD | Source: Ambulatory Visit | Attending: Nephrology | Admitting: Nephrology

## 2017-11-14 ENCOUNTER — Ambulatory Visit: Payer: BLUE CROSS/BLUE SHIELD | Admitting: Adult Health

## 2017-11-14 VITALS — BP 183/79 | HR 56

## 2017-11-14 DIAGNOSIS — N185 Chronic kidney disease, stage 5: Secondary | ICD-10-CM

## 2017-11-14 DIAGNOSIS — D631 Anemia in chronic kidney disease: Secondary | ICD-10-CM | POA: Insufficient documentation

## 2017-11-14 LAB — POCT HEMOGLOBIN-HEMACUE: Hemoglobin: 8.1 g/dL — ABNORMAL LOW (ref 12.0–15.0)

## 2017-11-14 MED ORDER — EPOETIN ALFA-EPBX 10000 UNIT/ML IJ SOLN
40000.0000 [IU] | Freq: Once | INTRAMUSCULAR | Status: AC
  Administered 2017-11-14: 40000 [IU] via SUBCUTANEOUS
  Filled 2017-11-14: qty 4

## 2017-11-14 MED ORDER — CLONIDINE HCL 0.1 MG PO TABS
ORAL_TABLET | ORAL | Status: AC
  Administered 2017-11-14: 0.1 mg
  Filled 2017-11-14: qty 1

## 2017-11-14 MED ORDER — EPOETIN ALFA-EPBX 40000 UNIT/ML IJ SOLN: 40000.0000 [IU] | INTRAMUSCULAR | Status: DC

## 2017-11-14 NOTE — Progress Notes (Deleted)
[facility] [address] [place]. [place] [address] [phone]       OFFICE FOLLOW UP NOTE  [name] Date of Birth:  [birth year] Medical Record Number:  [id number]   Reason for Referral:  Stroke hospital follow up  CHIEF COMPLAINT:  No chief complaint on file.   HPI:   52 y.o. female Erin Dean is being seen today for her initial hospital follow up in the office for left MCA infarct on 04/17/17. Patient is accompanied by her daughter and infant grandson. History obtained from patient, daughter, and chart review. Reviewed all radiology images and labs personally. Erin Dean is a 52 year old female with PMH of HTN, HLD and chronic renal insufficiency. She awoke in her normal state of health on 04/17/17 and was at work at 5:30am when she developed difficulty speaking. She states that it was an abrupt change in that she was normal before this. She denied numbness or weakness or any other neurological symptoms. Upon arrived to [facility] ED, she was found to have severe expressive aphasia but was able to answer yes/no questions with head nods. CT head showed acute nonhemorrhagic infarction affecting the left occipital cortex. Unable to obtain CTA due to renal insufficiency. TPA was administered in which she improved markedly. Pt c/o abdominal pain with emesis -  concerns for hematemesis post TPA. Upper endoscopy was negative for active bleeding and only mild gastritis. MRI showed acute infarction in left posterior M3 branch distribution affecting the temporoparietal junction, measuring about 5 cm in size. Swelling was noted but no hemorrhage or mass effect. MRA showed no large vessel anterior circulation finding with probable missing posterior M3 branch on the left and stenotic distal right vertebral artery. Carotid ultrasound showed right vertebral artery partially occluded. TEE negative. Patient hypertensive throughout admission ranging from 168-222/73-107. LDL  129. Hemoglobin A1c 5.2. Patient d/c'd with lipitor 40mg  (NEW), ASA 325mg  (NEW), clonidine (NEW), coreg (PTA, increased dose), norvasc (PTA), hydralazine (PTA), and lasix (PTA). Loop recorder inserted on 04/22/17. PT/OT recommended home PT/OT.  Since hospital discharge, patient doing well. All symptoms have resolved completely and per patient, she is back to her baseline. Pt c/o lower extremity edema in which she had mild edema prior to hospitalization but per patient and daughter, has increased in amt. Denies numbness, tingling, or weakness. Denies past stroke/TIA symptoms. Continues to take her lipitor without side effects. Continues to take aspirin 325mg  and denies increased bruising or bleeding. BP stable at todays appointment - 132/82. Patient states she checks her BP regularly at home and this is the normal - per patient, she is compliant with all blood pressure medications. Denies dizziness or lightheadedness. Loop recorder without issues - no readings to date. Requesting to return to work at a [employer]. No new or worsening TIA/stroke symptoms.    Interval history 11/14/17:   ROS:   14 system review of systems performed and negative with exception of: feet swelling and snoring  PMH:  Past Medical History:  Diagnosis Date  . Chronic kidney disease    see Dr Hollie Salk at [facility]  . Hypertension   . Stroke ([place]) 04/2017   no residual   . SVT (supraventricular tachycardia) (H[facility])     PSH:  Past Surgical History:  Procedure Laterality Date  . AV FISTULA PLACEMENT Right 08/16/2017   Procedure: CREATION OF RADIOCEPHALIC VERSUS BRACHIOCEPHALIC ARTERIOVENOUS FISTULA RIGHT ARM;  Surgeon: Conrad St. Helena, MD;  Location: [place];  Service: Vascular;  Laterality:  Right;  Marland Kitchen CESAREAN SECTION    . ESOPHAGOGASTRODUODENOSCOPY N/A 04/19/2017   Procedure: ESOPHAGOGASTRODUODENOSCOPY (EGD);  Surgeon: Carol Ada, MD;  Location: [place];  Service: Endoscopy;  Laterality: N/A;  . FISTULA  SUPERFICIALIZATION Right 10/01/2017   Procedure: FISTULA SUPERFICIALIZATION RIGHT ARM;  Surgeon: Conrad Cardwell, MD;  Location: [place];  Service: Vascular;  Laterality: Right;  . LOOP RECORDER INSERTION N/A 04/22/2017   Procedure: LOOP RECORDER INSERTION;  Surgeon: Constance Haw, MD;  Location: [facility];  Service: Cardiovascular;  Laterality: N/A;  . TEE WITHOUT CARDIOVERSION N/A 04/19/2017   Procedure: TRANSESOPHAGEAL ECHOCARDIOGRAM (TEE);  Surgeon: Carol Ada, MD;  Location: [place];  Service: Endoscopy;  Laterality: N/A;    Social History:  Social History   Socioeconomic History  . Marital status: Legally Separated    Spouse name: Not on file  . Number of children: Not on file  . Years of education: Not on file  . Highest education level: Not on file  Occupational History  . Not on file  Social Needs  . Financial resource strain: Not on file  . Food insecurity:    Worry: Not on file    Inability: Not on file  . Transportation needs:    Medical: Not on file    Non-medical: Not on file  Tobacco Use  . Smoking status: Never Smoker  . Smokeless tobacco: Never Used  Substance and Sexual Activity  . Alcohol use: No  . Drug use: No  . Sexual activity: Not on file  Lifestyle  . Physical activity:    Days per week: Not on file    Minutes per session: Not on file  . Stress: Not on file  Relationships  . Social connections:    Talks on phone: Not on file    Gets together: Not on file    Attends religious service: Not on file    Active member of club or organization: Not on file    Attends meetings of clubs or organizations: Not on file    Relationship status: Not on file  . Intimate partner violence:    Fear of current or ex partner: Not on file    Emotionally abused: Not on file    Physically abused: Not on file    Forced sexual activity: Not on file  Other Topics Concern  . Not on file  Social History Narrative   Lives with daughter   Education-  high school   On temp disability    Family History:  Family History  Problem Relation Age of Onset  . Heart attack Maternal Uncle   . Heart attack Maternal Grandfather   . Stroke Paternal Grandmother   . Cancer Father   . Hypertension Sister     Medications:   Current Outpatient Medications on File Prior to Visit  Medication Sig Dispense Refill  . amLODipine (NORVASC) 10 MG tablet Take 1 tablet (10 mg total) by mouth daily. 180 tablet 3  . aspirin EC 325 MG EC tablet Take 1 tablet (325 mg total) by mouth daily. 30 tablet 0  . atorvastatin (LIPITOR) 40 MG tablet Take 1 tablet (40 mg total) by mouth daily at 6 PM. 30 tablet 1  . carvedilol (COREG) 12.5 MG tablet Take 1 tablet (12.5 mg total) by mouth 2 (two) times daily. 30 tablet 1  . cloNIDine (CATAPRES) 0.1 MG tablet Take 1 tablet (0.1 mg total) by mouth 3 (three) times daily. 60 tablet 11  . escitalopram (LEXAPRO)  10 MG tablet Take 10 mg by mouth daily.    . ferrous sulfate 325 (65 FE) MG tablet Take 1 tablet (325 mg total) by mouth 3 (three) times daily with meals. 90 tablet 3  . furosemide (LASIX) 80 MG tablet Take 1 tablet (80 mg total) by mouth daily. 30 tablet 1  . hydrALAZINE (APRESOLINE) 100 MG tablet Take 1 tablet (100 mg total) by mouth 3 (three) times daily. 30 tablet 1  . HYDROcodone-acetaminophen (NORCO) 5-325 MG tablet Take 1 tablet by mouth every 6 (six) hours as needed for moderate pain. 8 tablet 0  . pantoprazole (PROTONIX) 40 MG tablet Take 1 tablet (40 mg total) by mouth daily. 30 tablet 1  . vitamin B-12 1000 MCG tablet Take 1 tablet (1,000 mcg total) by mouth daily. 30 tablet 1   No current facility-administered medications on file prior to visit.     Allergies:  No Known Allergies  Physical Exam  There were no vitals filed for this visit. There is no height or weight on file to calculate BMI. No exam data present  General: well developed, middle aged obese female, well nourished, seated, in no evident  distress Head: head normocephalic and atraumatic.   Neck: supple with no carotid or supraclavicular bruits Cardiovascular: regular rate and rhythm, no murmurs, 2+ pitting edema BLE Musculoskeletal: no deformity Skin:  no rash/petichiae Vascular:  Normal pulses all extremities  Neurologic Exam Mental Status: Awake and fully alert. Oriented to place and time. Recent and remote memory intact. Attention span, concentration and fund of knowledge appropriate. Mood and affect appropriate.  Cranial Nerves: Fundoscopic exam reveals sharp disc margins. Pupils equal, briskly reactive to light. Extraocular movements full without nystagmus. Visual fields full to confrontation. Hearing intact. Facial sensation intact. Face, tongue, palate moves normally and symmetrically.  Motor: Normal bulk and tone. Normal strength in all tested extremity muscles. Sensory.: intact to touch , pinprick , position and vibratory sensation.  Coordination: Rapid alternating movements normal in all extremities. Finger-to-nose and heel-to-shin performed accurately bilaterally. Gait and Station: Arises from chair without difficulty. Stance is normal. Gait demonstrates normal stride length and balance . Able to heel, toe and tandem walk without difficulty.  Reflexes: 2+ and symmetric. Toes downgoing.    NIHSS  0 Modified Rankin  0   Imaging  MRI/MRA Head/Brain Wo Contrast 04/18/2017 (I reviewed the imaging and agree with the findings below)  Acute infarction in a left posterior M3 branch distribution affecting the temporoparietal junction, measuring about 5 cm in size. Swelling but no hemorrhage or mass effect. Mild chronic small-vessel ischemic changes elsewhere within the hemispheric white matter. No large vessel anterior circulation finding. Probable missing posterior M3 branch on the left. Stenotic distal right vertebral artery.   Ct Head 04/17/2017 (I reviewed the imaging and agree with the findings below EXCEPT report  should say LEFT occipital cortex, not RIGHT) Acute nonhemorrhagic infarction affecting the RIGHT occipital cortex and underlying white matter, as described above.Vascular distribution corresponds to the posterior-most M3 division LEFT MCA.   Echocardiogram:                                               Study Conclusions - Left ventricle: The cavity size was normal. Wall thickness was normal. Systolic function was normal. The estimated ejection fraction was in the range of 60% to 65%. - Aortic  valve: There was mild regurgitation. - Mitral valve: There was mild regurgitation. - Left atrium: The atrium was moderately dilated. - Right atrium: The atrium was mildly dilated. - Atrial septum: No defect or patent foramen ovale was identified.  TEE  04/19/17 No cardiac source of emboli indentified  B/L Carotid U/S 04/18/17                                     Right Carotid: There is evidence in the right ICA of a 1-39% stenosis. Left Carotid: There is evidence in the left ICA of a 1-39% stenosis. Vertebrals: Left vertebral artery was patent with antegrade flow. Right vertebral appears partially occluded.  Renal Artery U/S 1-59% stenosis of right renal artery No evidence of left renal artery stenosis   ASSESSMENT:  52 y.o. 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 Vascular risk factors are HTN, HLD, and obesity.    No diagnosis found.   PLAN:  -instructed patient to continue taking aspirin 325mg  and Lipitor 40mg  for secondary stroke prevention. Intrusted patient to continue to check BP at home and maintain satisfactory levels of <130/80 with compliance of hypertension medications. Pt will have PCP check lipid panel in 2-3 months.  -loop recorder - continue to monitor for atrial fibrillation as this stroke was most likely cardioembolic -instructed patient to continue with diet, exercise, and weight loss -provided patient with note for returning to work; may  return to work from neurologic standpoint -follow up in 6 months  No follow-ups on file.  Greater than 50% of time during this 25 minute visit was spent on counseling, explanation of diagnosis, planning of further management, discussion with patient and family and coordination of care.   Venancio Poisson, AGNP-BC  [facility] [address] [place], [place] [address]  Phone [phone] Fax [phone] Note: This document was prepared with digital dictation and possible smart phrase technology. Any transcriptional errors that result from this process are unintentional.

## 2017-11-14 NOTE — Telephone Encounter (Signed)
Pt daughter(on DPR-Taher,Whitney [phone])called to inform that pt is at hospital right now for an injection, however it can be done until they are able to lower pt's blood pressure.  This is why pt's appointment was cancelled for today and rescheduled for 11-18-2017.  -Drach,Whitney asked that pt be scheduled as soon as possible because pt has been complaining about arm being swollen.  Phone rep was advised by RN Stanton Kidney C to send message to NP Janett Billow to see if anything else would be done to possibly get pt in before 08-12.

## 2017-11-14 NOTE — Telephone Encounter (Signed)
Spoke with daughter, Erin Dean and advised Erin Dean does not have any openings tomorrow. Advised she needs to call her vascularsurgeon regarding swelling of her arm.  The daughter asked if Dr Bridgett Larsson was in this office.  This RN advised her he is not in this office. Daughter stated her mother was confused, and she stated she will call Dr Bridgett Larsson. This RN reminded her of FU that was rescheduled for next Mon with Erin Dean. She verbalized understanding, appreciation.

## 2017-11-14 NOTE — Discharge Instructions (Signed)
Epoetin Alfa injection °What is this medicine? °EPOETIN ALFA (e POE e tin AL fa) helps your body make more red blood cells. This medicine is used to treat anemia caused by chronic kidney failure, cancer chemotherapy, or HIV-therapy. It may also be used before surgery if you have anemia. °This medicine may be used for other purposes; ask your health care provider or pharmacist if you have questions. °COMMON BRAND NAME(S): Epogen, Procrit °What should I tell my health care provider before I take this medicine? °They need to know if you have any of these conditions: °-blood clotting disorders °-cancer patient not on chemotherapy °-cystic fibrosis °-heart disease, such as angina or heart failure °-hemoglobin level of 12 g/dL or greater °-high blood pressure °-low levels of folate, iron, or vitamin B12 °-seizures °-an unusual or allergic reaction to erythropoietin, albumin, benzyl alcohol, hamster proteins, other medicines, foods, dyes, or preservatives °-pregnant or trying to get pregnant °-breast-feeding °How should I use this medicine? °This medicine is for injection into a vein or under the skin. It is usually given by a health care professional in a hospital or clinic setting. °If you get this medicine at home, you will be taught how to prepare and give this medicine. Use exactly as directed. Take your medicine at regular intervals. Do not take your medicine more often than directed. °It is important that you put your used needles and syringes in a special sharps container. Do not put them in a trash can. If you do not have a sharps container, call your pharmacist or healthcare provider to get one. °A special MedGuide will be given to you by the pharmacist with each prescription and refill. Be sure to read this information carefully each time. °Talk to your pediatrician regarding the use of this medicine in children. While this drug may be prescribed for selected conditions, precautions do apply. °Overdosage: If you  think you have taken too much of this medicine contact a poison control center or emergency room at once. °NOTE: This medicine is only for you. Do not share this medicine with others. °What if I miss a dose? °If you miss a dose, take it as soon as you can. If it is almost time for your next dose, take only that dose. Do not take double or extra doses. °What may interact with this medicine? °Do not take this medicine with any of the following medications: °-darbepoetin alfa °This list may not describe all possible interactions. Give your health care provider a list of all the medicines, herbs, non-prescription drugs, or dietary supplements you use. Also tell them if you smoke, drink alcohol, or use illegal drugs. Some items may interact with your medicine. °What should I watch for while using this medicine? °Your condition will be monitored carefully while you are receiving this medicine. °You may need blood work done while you are taking this medicine. °What side effects may I notice from receiving this medicine? °Side effects that you should report to your doctor or health care professional as soon as possible: °-allergic reactions like skin rash, itching or hives, swelling of the face, lips, or tongue °-breathing problems °-changes in vision °-chest pain °-confusion, trouble speaking or understanding °-feeling faint or lightheaded, falls °-high blood pressure °-muscle aches or pains °-pain, swelling, warmth in the leg °-rapid weight gain °-severe headaches °-sudden numbness or weakness of the face, arm or leg °-trouble walking, dizziness, loss of balance or coordination °-seizures (convulsions) °-swelling of the ankles, feet, hands °-unusually weak or tired °  Side effects that usually do not require medical attention (report to your doctor or health care professional if they continue or are bothersome): °-diarrhea °-fever, chills (flu-like symptoms) °-headaches °-nausea, vomiting °-redness, stinging, or swelling at  site where injected °This list may not describe all possible side effects. Call your doctor for medical advice about side effects. You may report side effects to FDA at 1-800-FDA-1088. °Where should I keep my medicine? °Keep out of the reach of children. °Store in a refrigerator between 2 and 8 degrees C (36 and 46 degrees F). Do not freeze or shake. Throw away any unused portion if using a single-dose vial. Multi-dose vials can be kept in the refrigerator for up to 21 days after the initial dose. Throw away unused medicine. °NOTE: This sheet is a summary. It may not cover all possible information. If you have questions about this medicine, talk to your doctor, pharmacist, or health care provider. °© 2018 Elsevier/Gold Standard (2015-11-14 19:42:31) ° °

## 2017-11-14 NOTE — Telephone Encounter (Signed)
Unfortunately, I am unable to see patient sooner then Monday as my schedule is full tomorrow. In regards to her right arm being swollen, she may need to call vasc surgery as a fistula was placed on 10/01/17 for HD access. When patient was seen at previous appointment with [place], she did not have residual stroke deficits therefore her arm being swollen is not related to her stroke or neurologic deficit. Please advise daughter to follow up with vasc surgery regarding this concern. Thank you.

## 2017-11-15 ENCOUNTER — Ambulatory Visit (INDEPENDENT_AMBULATORY_CARE_PROVIDER_SITE_OTHER): Payer: Self-pay | Admitting: Physician Assistant

## 2017-11-15 ENCOUNTER — Encounter: Payer: Self-pay | Admitting: Physician Assistant

## 2017-11-15 ENCOUNTER — Other Ambulatory Visit: Payer: Self-pay

## 2017-11-15 VITALS — BP 179/90 | HR 53 | Temp 97.5°F | Resp 14 | Ht 68.0 in | Wt 279.0 lb

## 2017-11-15 DIAGNOSIS — N185 Chronic kidney disease, stage 5: Secondary | ICD-10-CM

## 2017-11-15 NOTE — Progress Notes (Signed)
    Postoperative Access Visit   History of Present Illness   Erin Dean is a 52 y.o. year old female who presents for postoperative follow-up for: right arm superficialization of brachiocephalic fistula by Dr. Bridgett Larsson (Date: 10/01/17).  The patient's wounds are healed.  The patient denies steal symptoms.  The patient is able to complete their activities of daily living.  The patient does complain of a painful, swollen area near her AC fossa.  She has some redness and warm feeling to touch in this area.  She denies fevers, chills, N/V.  She is not yet on HD.  CKD managed by Dr. Hollie Salk.   Physical Examination   Vitals:   11/15/17 1402 11/15/17 1406  BP: (!) 183/93 (!) 179/90  Pulse: (!) 54 (!) 53  Resp: 14   Temp: (!) 97.5 F (36.4 C)   TempSrc: Oral   SpO2: 94%   Weight: 279 lb (126.6 kg)   Height: 5\' 8"  (1.727 m)    Body mass index is 42.42 kg/m.  right arm Incision is healed, hand grip is 5/5, sensation in digits is intact, palpable thrill, bruit can be auscultated; palpable R radial pulse; palpable fluid collection near [facility] fossa with some mild erythema and tenderness to light touch, no pulsatility to fluid collection     Medical Decision Making   Erin Dean is a 52 y.o. year old female who presents s/p right brachiocephalic arteriovenous fistula superficialization by Dr. Bridgett Larsson 10/01/17   Fluid collection at surgical site, likely seroma vs hematoma  Recommended elevation of R arm and warm compress to speed resorption  R arm BC fistula is ready for use if emergent HD is initiated however would prefer to wait until pain and fluid collection have improved   Erin Ligas PA-C [facility] Office: [phone]

## 2017-11-15 NOTE — Progress Notes (Deleted)
[facility] [address] [place]. [place] [address] [phone]       OFFICE FOLLOW UP NOTE  [name] Date of Birth:  [birth year] Medical Record Number:  [id number]   Reason for Referral:  Stroke hospital follow up  CHIEF COMPLAINT:  No chief complaint on file.   HPI:   52 y.o. female Erin Dean is being seen today for her initial hospital follow up in the office for left MCA infarct on 04/17/17. Patient is accompanied by her daughter and infant grandson. History obtained from patient, daughter, and chart review. Reviewed all radiology images and labs personally. Erin Dean is a 52 year old female with PMH of HTN, HLD and chronic renal insufficiency. She awoke in her normal state of health on 04/17/17 and was at work at 5:30am when she developed difficulty speaking. She states that it was an abrupt change in that she was normal before this. She denied numbness or weakness or any other neurological symptoms. Upon arrived to [facility] ED, she was found to have severe expressive aphasia but was able to answer yes/no questions with head nods. CT head showed acute nonhemorrhagic infarction affecting the left occipital cortex. Unable to obtain CTA due to renal insufficiency. TPA was administered in which she improved markedly. Pt c/o abdominal pain with emesis -  concerns for hematemesis post TPA. Upper endoscopy was negative for active bleeding and only mild gastritis. MRI showed acute infarction in left posterior M3 branch distribution affecting the temporoparietal junction, measuring about 5 cm in size. Swelling was noted but no hemorrhage or mass effect. MRA showed no large vessel anterior circulation finding with probable missing posterior M3 branch on the left and stenotic distal right vertebral artery. Carotid ultrasound showed right vertebral artery partially occluded. TEE negative. Patient hypertensive throughout admission ranging from 168-222/73-107. LDL  129. Hemoglobin A1c 5.2. Patient d/c'd with lipitor 40mg  (NEW), ASA 325mg  (NEW), clonidine (NEW), coreg (PTA, increased dose), norvasc (PTA), hydralazine (PTA), and lasix (PTA). Loop recorder inserted on 04/22/17. PT/OT recommended home PT/OT.  Since hospital discharge, patient doing well. All symptoms have resolved completely and per patient, she is back to her baseline. Pt c/o lower extremity edema in which she had mild edema prior to hospitalization but per patient and daughter, has increased in amt. Denies numbness, tingling, or weakness. Denies past stroke/TIA symptoms. Continues to take her lipitor without side effects. Continues to take aspirin 325mg  and denies increased bruising or bleeding. BP stable at todays appointment - 132/82. Patient states she checks her BP regularly at home and this is the normal - per patient, she is compliant with all blood pressure medications. Denies dizziness or lightheadedness. Loop recorder without issues - no readings to date. Requesting to return to work at a [employer]. No new or worsening TIA/stroke symptoms.    Interval history 11/14/17:   ROS:   14 system review of systems performed and negative with exception of: feet swelling and snoring  PMH:  Past Medical History:  Diagnosis Date  . Chronic kidney disease    see Dr Hollie Salk at [facility]  . Hypertension   . Stroke ([place]) 04/2017   no residual   . SVT (supraventricular tachycardia) (HCC)     PSH:  Past Surgical History:  Procedure Laterality Date  . AV FISTULA PLACEMENT Right 08/16/2017   Procedure: CREATION OF RADIOCEPHALIC VERSUS BRACHIOCEPHALIC ARTERIOVENOUS FISTULA RIGHT ARM;  Surgeon: Conrad Gordon, MD;  Location: [place];  Service: Vascular;  Laterality:  Right;  Marland Kitchen CESAREAN SECTION    . ESOPHAGOGASTRODUODENOSCOPY N/A 04/19/2017   Procedure: ESOPHAGOGASTRODUODENOSCOPY (EGD);  Surgeon: Carol Ada, MD;  Location: [place];  Service: Endoscopy;  Laterality: N/A;  . FISTULA  SUPERFICIALIZATION Right 10/01/2017   Procedure: FISTULA SUPERFICIALIZATION RIGHT ARM;  Surgeon: Conrad [place], MD;  Location: [place];  Service: Vascular;  Laterality: Right;  . LOOP RECORDER INSERTION N/A 04/22/2017   Procedure: LOOP RECORDER INSERTION;  Surgeon: Constance Haw, MD;  Location: [place] [facility];  Service: Cardiovascular;  Laterality: N/A;  . TEE WITHOUT CARDIOVERSION N/A 04/19/2017   Procedure: TRANSESOPHAGEAL ECHOCARDIOGRAM (TEE);  Surgeon: Carol Ada, MD;  Location: [place];  Service: Endoscopy;  Laterality: N/A;    Social History:  Social History   Socioeconomic History  . Marital status: Legally Separated    Spouse name: Not on file  . Number of children: Not on file  . Years of education: Not on file  . Highest education level: Not on file  Occupational History  . Not on file  Social Needs  . Financial resource strain: Not on file  . Food insecurity:    Worry: Not on file    Inability: Not on file  . Transportation needs:    Medical: Not on file    Non-medical: Not on file  Tobacco Use  . Smoking status: Never Smoker  . Smokeless tobacco: Never Used  Substance and Sexual Activity  . Alcohol use: No  . Drug use: No  . Sexual activity: Not on file  Lifestyle  . Physical activity:    Days per week: Not on file    Minutes per session: Not on file  . Stress: Not on file  Relationships  . Social connections:    Talks on phone: Not on file    Gets together: Not on file    Attends religious service: Not on file    Active member of club or organization: Not on file    Attends meetings of clubs or organizations: Not on file    Relationship status: Not on file  . Intimate partner violence:    Fear of current or ex partner: Not on file    Emotionally abused: Not on file    Physically abused: Not on file    Forced sexual activity: Not on file  Other Topics Concern  . Not on file  Social History Narrative   Lives with daughter   Education-  high school   On temp disability    Family History:  Family History  Problem Relation Age of Onset  . Heart attack Maternal Uncle   . Heart attack Maternal Grandfather   . Stroke Paternal Grandmother   . Cancer Father   . Hypertension Sister     Medications:   Current Outpatient Medications on File Prior to Visit  Medication Sig Dispense Refill  . amLODipine (NORVASC) 10 MG tablet Take 1 tablet (10 mg total) by mouth daily. 180 tablet 3  . aspirin EC 325 MG EC tablet Take 1 tablet (325 mg total) by mouth daily. 30 tablet 0  . atorvastatin (LIPITOR) 40 MG tablet Take 1 tablet (40 mg total) by mouth daily at 6 PM. 30 tablet 1  . carvedilol (COREG) 12.5 MG tablet Take 1 tablet (12.5 mg total) by mouth 2 (two) times daily. 30 tablet 1  . cloNIDine (CATAPRES) 0.1 MG tablet Take 1 tablet (0.1 mg total) by mouth 3 (three) times daily. 60 tablet 11  . escitalopram (LEXAPRO)  10 MG tablet Take 10 mg by mouth daily.    . ferrous sulfate 325 (65 FE) MG tablet Take 1 tablet (325 mg total) by mouth 3 (three) times daily with meals. 90 tablet 3  . furosemide (LASIX) 80 MG tablet Take 1 tablet (80 mg total) by mouth daily. 30 tablet 1  . hydrALAZINE (APRESOLINE) 100 MG tablet Take 1 tablet (100 mg total) by mouth 3 (three) times daily. 30 tablet 1  . HYDROcodone-acetaminophen (NORCO) 5-325 MG tablet Take 1 tablet by mouth every 6 (six) hours as needed for moderate pain. 8 tablet 0  . pantoprazole (PROTONIX) 40 MG tablet Take 1 tablet (40 mg total) by mouth daily. 30 tablet 1  . vitamin B-12 1000 MCG tablet Take 1 tablet (1,000 mcg total) by mouth daily. 30 tablet 1   No current facility-administered medications on file prior to visit.     Allergies:  No Known Allergies  Physical Exam  There were no vitals filed for this visit. There is no height or weight on file to calculate BMI. No exam data present  General: well developed, middle aged obese female, well nourished, seated, in no evident  distress Head: head normocephalic and atraumatic.   Neck: supple with no carotid or supraclavicular bruits Cardiovascular: regular rate and rhythm, no murmurs, 2+ pitting edema BLE Musculoskeletal: no deformity Skin:  no rash/petichiae Vascular:  Normal pulses all extremities  Neurologic Exam Mental Status: Awake and fully alert. Oriented to place and time. Recent and remote memory intact. Attention span, concentration and fund of knowledge appropriate. Mood and affect appropriate.  Cranial Nerves: Fundoscopic exam reveals sharp disc margins. Pupils equal, briskly reactive to light. Extraocular movements full without nystagmus. Visual fields full to confrontation. Hearing intact. Facial sensation intact. Face, tongue, palate moves normally and symmetrically.  Motor: Normal bulk and tone. Normal strength in all tested extremity muscles. Sensory.: intact to touch , pinprick , position and vibratory sensation.  Coordination: Rapid alternating movements normal in all extremities. Finger-to-nose and heel-to-shin performed accurately bilaterally. Gait and Station: Arises from chair without difficulty. Stance is normal. Gait demonstrates normal stride length and balance . Able to heel, toe and tandem walk without difficulty.  Reflexes: 2+ and symmetric. Toes downgoing.    NIHSS  0 Modified Rankin  0   Imaging  MRI/MRA Head/Brain Wo Contrast 04/18/2017 (I reviewed the imaging and agree with the findings below)  Acute infarction in a left posterior M3 branch distribution affecting the temporoparietal junction, measuring about 5 cm in size. Swelling but no hemorrhage or mass effect. Mild chronic small-vessel ischemic changes elsewhere within the hemispheric white matter. No large vessel anterior circulation finding. Probable missing posterior M3 branch on the left. Stenotic distal right vertebral artery.   Ct Head 04/17/2017 (I reviewed the imaging and agree with the findings below EXCEPT report  should say LEFT occipital cortex, not RIGHT) Acute nonhemorrhagic infarction affecting the RIGHT occipital cortex and underlying white matter, as described above.Vascular distribution corresponds to the posterior-most M3 division LEFT MCA.   Echocardiogram:                                               Study Conclusions - Left ventricle: The cavity size was normal. Wall thickness was normal. Systolic function was normal. The estimated ejection fraction was in the range of 60% to 65%. - Aortic  valve: There was mild regurgitation. - Mitral valve: There was mild regurgitation. - Left atrium: The atrium was moderately dilated. - Right atrium: The atrium was mildly dilated. - Atrial septum: No defect or patent foramen ovale was identified.  TEE  04/19/17 No cardiac source of emboli indentified  B/L Carotid U/S 04/18/17                                     Right Carotid: There is evidence in the right ICA of a 1-39% stenosis. Left Carotid: There is evidence in the left ICA of a 1-39% stenosis. Vertebrals: Left vertebral artery was patent with antegrade flow. Right vertebral appears partially occluded.  Renal Artery U/S 1-59% stenosis of right renal artery No evidence of left renal artery stenosis   ASSESSMENT:  52 y.o. 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 Vascular risk factors are HTN, HLD, and obesity.    No diagnosis found.   PLAN:  -instructed patient to continue taking aspirin 325mg  and Lipitor 40mg  for secondary stroke prevention. Intrusted patient to continue to check BP at home and maintain satisfactory levels of <130/80 with compliance of hypertension medications. Pt will have PCP check lipid panel in 2-3 months.  -loop recorder - continue to monitor for atrial fibrillation as this stroke was most likely cardioembolic -instructed patient to continue with diet, exercise, and weight loss -provided patient with note for returning to work; may  return to work from neurologic standpoint -follow up in 6 months  No follow-ups on file.  Greater than 50% of time during this 25 minute visit was spent on counseling, explanation of diagnosis, planning of further management, discussion with patient and family and coordination of care.   Venancio Poisson, AGNP-BC  [facility] [address] [place], [place] [address]  Phone [phone] Fax [phone] Note: This document was prepared with digital dictation and possible smart phrase technology. Any transcriptional errors that result from this process are unintentional.

## 2017-11-15 NOTE — Progress Notes (Signed)
Vitals:   11/15/17 1402  BP: (!) 183/93  Pulse: (!) 54  Resp: 14  Temp: (!) 97.5 F (36.4 C)  TempSrc: Oral  SpO2: 94%  Weight: 279 lb (126.6 kg)  Height: 5\' 8"  (1.727 m)

## 2017-11-18 ENCOUNTER — Encounter: Payer: Self-pay | Admitting: Adult Health

## 2017-11-18 ENCOUNTER — Ambulatory Visit: Payer: BLUE CROSS/BLUE SHIELD | Admitting: Adult Health

## 2017-11-19 ENCOUNTER — Telehealth: Payer: Self-pay

## 2017-11-19 NOTE — Telephone Encounter (Signed)
Patient no show for appt on 11/18/2017.

## 2017-12-12 ENCOUNTER — Encounter (HOSPITAL_COMMUNITY): Payer: Self-pay

## 2017-12-18 ENCOUNTER — Other Ambulatory Visit (HOSPITAL_COMMUNITY): Payer: Self-pay | Admitting: *Deleted

## 2017-12-19 ENCOUNTER — Ambulatory Visit (HOSPITAL_COMMUNITY)
Admission: RE | Admit: 2017-12-19 | Discharge: 2017-12-19 | Disposition: A | Discharge status: Home / Self Care | Payer: Self-pay | Source: Ambulatory Visit | Attending: Nephrology | Admitting: Nephrology

## 2017-12-19 VITALS — BP 184/90 | HR 58 | Temp 98.6°F | Resp 20

## 2017-12-19 DIAGNOSIS — D631 Anemia in chronic kidney disease: Secondary | ICD-10-CM | POA: Insufficient documentation

## 2017-12-19 DIAGNOSIS — N185 Chronic kidney disease, stage 5: Secondary | ICD-10-CM | POA: Insufficient documentation

## 2017-12-19 LAB — POCT HEMOGLOBIN-HEMACUE: Hemoglobin: 9.1 g/dL — ABNORMAL LOW (ref 12.0–15.0)

## 2017-12-19 MED ORDER — EPOETIN ALFA-EPBX 10000 UNIT/ML IJ SOLN
40000.0000 [IU] | Freq: Once | INTRAMUSCULAR | Status: AC
  Administered 2017-12-19: 40000 [IU] via SUBCUTANEOUS
  Filled 2017-12-19: qty 4

## 2017-12-19 MED ORDER — EPOETIN ALFA-EPBX 40000 UNIT/ML IJ SOLN: 40000.0000 [IU] | INTRAMUSCULAR | Status: DC

## 2017-12-19 MED ORDER — FERUMOXYTOL INJECTION 510 MG/17 ML
510.0000 mg | INTRAVENOUS | Status: DC
  Administered 2017-12-19: 510 mg via INTRAVENOUS
  Filled 2017-12-19: qty 17

## 2017-12-19 MED ORDER — CLONIDINE HCL 0.1 MG PO TABS: 0.1000 mg | ORAL_TABLET | Freq: Once | ORAL | Status: DC

## 2017-12-19 NOTE — Discharge Instructions (Signed)

## 2017-12-23 ENCOUNTER — Encounter (HOSPITAL_COMMUNITY): Payer: Self-pay

## 2017-12-26 ENCOUNTER — Encounter (HOSPITAL_COMMUNITY): Payer: Self-pay

## 2017-12-30 ENCOUNTER — Telehealth: Payer: Self-pay

## 2017-12-30 NOTE — Telephone Encounter (Signed)
Returned patient call. She states her right uppr arm from the AVF up is red and swollen and tender to touch. No drainage, fever or chills. Made appt for 12/31/17 at 3:00 pm to be assessed.

## 2017-12-31 ENCOUNTER — Ambulatory Visit (INDEPENDENT_AMBULATORY_CARE_PROVIDER_SITE_OTHER): Payer: Self-pay | Admitting: Physician Assistant

## 2017-12-31 ENCOUNTER — Ambulatory Visit (HOSPITAL_COMMUNITY)
Admission: RE | Admit: 2017-12-31 | Discharge: 2017-12-31 | Disposition: A | Discharge status: Home / Self Care | Payer: Self-pay | Source: Ambulatory Visit | Attending: Nephrology | Admitting: Nephrology

## 2017-12-31 ENCOUNTER — Other Ambulatory Visit: Payer: Self-pay

## 2017-12-31 VITALS — Temp 97.0°F | Resp 14 | Ht 68.0 in | Wt 261.0 lb

## 2017-12-31 DIAGNOSIS — N185 Chronic kidney disease, stage 5: Secondary | ICD-10-CM

## 2017-12-31 DIAGNOSIS — D631 Anemia in chronic kidney disease: Secondary | ICD-10-CM | POA: Insufficient documentation

## 2017-12-31 DIAGNOSIS — N189 Chronic kidney disease, unspecified: Secondary | ICD-10-CM | POA: Insufficient documentation

## 2017-12-31 MED ORDER — SODIUM CHLORIDE 0.9 % IV SOLN
510.0000 mg | INTRAVENOUS | Status: DC
  Administered 2017-12-31: 510 mg via INTRAVENOUS
  Filled 2017-12-31: qty 17

## 2017-12-31 NOTE — Progress Notes (Signed)
    Postoperative Access Visit   History of Present Illness   Erin Dean is a 51 y.o. year old female who presents for postoperative follow-up for right arm AV fistula.  She is s/p R brachiocephalic fistula with subsequent superficialization surgery by Dr. Bridgett Larsson 10/01/17.  She is not yet on heamodialysis.  She has a follow up appointment with her Nephrologist Dr. Hollie Salk in early October.  She is complaining of pain and tenderness to her R arm starting this past Friday 12/27/17.  She also says that she was no longer able to feel the vibration of her fistula around this time.  This discomfort is relieved with warm compresses however has been persistent over the past few days.  She denies SOB, pleuritic chest pain, cough, fever, chills, N/V.    Physical Examination   Vitals:   12/31/17 1511  Resp: 14  Temp: (!) 97 F (36.1 C)  TempSrc: Oral  SpO2: 98%  Weight: 261 lb (118.4 kg)  Height: 5\' 8"  (1.727 m)   Body mass index is 39.68 kg/m.  right arm Incision is healed, hand grip is 5/5, sensation in digits is intact, no palpable thrill, bruit can not be auscultated; palpable R radial pulse; induration and tenderness to touch along the path of her tunnelled AV fistula; no erythema     Medical Decision Making   Erin Dean is a 52 y.o. year old female who presents with tenderness and pain in the area of R arm fistula   Superficialized R brachiocephalic fistula appears to be occluded  Patient likely experiencing phlebitis likely related to thrombosis of AV fistula  Encouraged warm compresses on and off over the next few days  Tylenol for discomfort  Patient will return to office in about 4 weeks to discuss new dialysis access   Dagoberto Ligas PA-C [facility] Office: [phone]

## 2018-01-09 ENCOUNTER — Encounter (HOSPITAL_COMMUNITY): Payer: Self-pay

## 2018-01-14 DIAGNOSIS — Z0279 Encounter for issue of other medical certificate: Secondary | ICD-10-CM

## 2018-01-16 ENCOUNTER — Inpatient Hospital Stay (HOSPITAL_COMMUNITY): Admission: RE | Admit: 2018-01-16 | Payer: Self-pay | Source: Ambulatory Visit

## 2018-01-20 ENCOUNTER — Ambulatory Visit (HOSPITAL_COMMUNITY)
Admission: RE | Admit: 2018-01-20 | Discharge: 2018-01-20 | Disposition: A | Discharge status: Home / Self Care | Payer: PRIVATE HEALTH INSURANCE | Source: Ambulatory Visit | Attending: Nephrology | Admitting: Nephrology

## 2018-01-20 VITALS — BP 178/93 | HR 64 | Temp 98.6°F | Resp 20

## 2018-01-20 DIAGNOSIS — D631 Anemia in chronic kidney disease: Secondary | ICD-10-CM | POA: Insufficient documentation

## 2018-01-20 DIAGNOSIS — N185 Chronic kidney disease, stage 5: Secondary | ICD-10-CM

## 2018-01-20 DIAGNOSIS — N189 Chronic kidney disease, unspecified: Secondary | ICD-10-CM | POA: Insufficient documentation

## 2018-01-20 LAB — IRON AND TIBC
Iron: 46 ug/dL (ref 28–170)
SATURATION RATIOS: 20 % (ref 10.4–31.8)
TIBC: 227 ug/dL — AB (ref 250–450)
UIBC: 181 ug/dL

## 2018-01-20 LAB — FERRITIN: FERRITIN: 211 ng/mL (ref 11–307)

## 2018-01-20 LAB — POCT HEMOGLOBIN-HEMACUE: HEMOGLOBIN: 10.3 g/dL — AB (ref 12.0–15.0)

## 2018-01-20 MED ORDER — EPOETIN ALFA-EPBX 40000 UNIT/ML IJ SOLN
40000.0000 [IU] | INTRAMUSCULAR | Status: DC
  Filled 2018-01-20: qty 1

## 2018-01-20 MED ORDER — CLONIDINE HCL 0.1 MG PO TABS: 0.1000 mg | ORAL_TABLET | Freq: Once | ORAL | Status: DC

## 2018-01-20 MED ORDER — EPOETIN ALFA-EPBX 10000 UNIT/ML IJ SOLN
40000.0000 [IU] | Freq: Once | INTRAMUSCULAR | Status: AC
  Administered 2018-01-20: 40000 [IU] via SUBCUTANEOUS
  Filled 2018-01-20: qty 4

## 2018-01-21 ENCOUNTER — Ambulatory Visit (INDEPENDENT_AMBULATORY_CARE_PROVIDER_SITE_OTHER): Payer: Self-pay | Admitting: Vascular Surgery

## 2018-01-21 ENCOUNTER — Encounter: Payer: Self-pay | Admitting: Vascular Surgery

## 2018-01-21 ENCOUNTER — Other Ambulatory Visit: Payer: Self-pay

## 2018-01-21 ENCOUNTER — Encounter: Payer: Self-pay | Admitting: *Deleted

## 2018-01-21 VITALS — BP 242/123 | HR 70 | Temp 97.3°F | Resp 16 | Ht 67.0 in | Wt 258.0 lb

## 2018-01-21 DIAGNOSIS — N185 Chronic kidney disease, stage 5: Secondary | ICD-10-CM

## 2018-01-21 NOTE — Progress Notes (Signed)
Vitals:   01/21/18 0835  BP: (!) 245/119  Pulse: 70  Resp: 16  Temp: (!) 97.3 F (36.3 C)  TempSrc: Oral  SpO2: 98%  Weight: 258 lb (117 kg)  Height: 5\' 7"  (1.702 m)

## 2018-01-21 NOTE — Progress Notes (Signed)
Upon taking vitals, blood pressure was 245/119 in left arm and heart rate 70.  After waiting 5 minutes and rechecking, it read 242/123.  Patient states that she had not taken her medication and that this reading is not uncommon.  I alerted Dr Carlis Abbott.  The BP was again taken manually and it was 240/90 by Penni Homans RN. The patient explained the same to Lattie Haw saying this is not the first time it has been this high.  Lattie Haw suggested that the patient to go to her PCP or the ED.  Patient voiced understanding of the instructions and stated she'd take her medication.  She takes BP meds tid.

## 2018-01-21 NOTE — Progress Notes (Signed)
Patient name: Erin Dean MRN: [id number] DOB: 1965-11-13 Sex: female  REASON FOR VISIT: Evaluate for new hemodialysis access  HPI: Erin Dean is a 52 y.o. female with chronic kidney disease not yet on dialysis that presents for new dialysis access evaluation.  Patient previously had a right upper extremity brachiocephalic fistula placed by Dr. Bridgett Larsson on 08/16/2017.  Patient states this initially worked well with a good thrill.  She subsequently underwent superficilization with sidebranch ligation on 10/01/2017.  Unfortunately she was recently seen in clinic by one of our PAs and her fistula had thrombosed and she was having a lot of pain over the fistula site in her right upper arm.  She states the pain is resolved but she still has no thrill in the fistula.  No other upper extremity surgeries other than her prior brachiocephalic fistula placement with revision.  She denies any numbness tingling weakness or any other symptoms in her right hand.  She has not started dialysis at this time.  She is anxious to get a working fistula.  She is left hand dominant.    Past Medical History:  Diagnosis Date  . Chronic kidney disease    see Dr Hollie Salk at [facility]  . Hypertension   . Stroke ([place]) 04/2017   no residual   . SVT (supraventricular tachycardia) ([place])     Past Surgical History:  Procedure Laterality Date  . AV FISTULA PLACEMENT Right 08/16/2017   Procedure: CREATION OF RADIOCEPHALIC VERSUS BRACHIOCEPHALIC ARTERIOVENOUS FISTULA RIGHT ARM;  Surgeon: Conrad [place], MD;  Location: [place];  Service: Vascular;  Laterality: Right;  . CESAREAN SECTION    . ESOPHAGOGASTRODUODENOSCOPY N/A 04/19/2017   Procedure: ESOPHAGOGASTRODUODENOSCOPY (EGD);  Surgeon: Carol Ada, MD;  Location: [place];  Service: Endoscopy;  Laterality: N/A;  . FISTULA SUPERFICIALIZATION Right 10/01/2017   Procedure: FISTULA SUPERFICIALIZATION RIGHT ARM;  Surgeon: Conrad [place], MD;  Location: [place];  Service:  Vascular;  Laterality: Right;  . LOOP RECORDER INSERTION N/A 04/22/2017   Procedure: LOOP RECORDER INSERTION;  Surgeon: Constance Haw, MD;  Location: [place] [facility];  Service: Cardiovascular;  Laterality: N/A;  . TEE WITHOUT CARDIOVERSION N/A 04/19/2017   Procedure: TRANSESOPHAGEAL ECHOCARDIOGRAM (TEE);  Surgeon: Carol Ada, MD;  Location: [place];  Service: Endoscopy;  Laterality: N/A;    Family History  Problem Relation Age of Onset  . Heart attack Maternal Uncle   . Heart attack Maternal Grandfather   . Stroke Paternal Grandmother   . Cancer Father   . Hypertension Sister     SOCIAL HISTORY: Social History   Tobacco Use  . Smoking status: Never Smoker  . Smokeless tobacco: Never Used  Substance Use Topics  . Alcohol use: No    No Known Allergies  Current Outpatient Medications  Medication Sig Dispense Refill  . amLODipine (NORVASC) 10 MG tablet Take 1 tablet (10 mg total) by mouth daily. 180 tablet 3  . aspirin EC 325 MG EC tablet Take 1 tablet (325 mg total) by mouth daily. 30 tablet 0  . atorvastatin (LIPITOR) 40 MG tablet Take 1 tablet (40 mg total) by mouth daily at 6 PM. 30 tablet 1  . calcitRIOL (ROCALTROL) 0.25 MCG capsule TK 1 C PO D  3  . carvedilol (COREG) 12.5 MG tablet Take 1 tablet (12.5 mg total) by mouth 2 (two) times daily. 30 tablet 1  . cloNIDine (CATAPRES) 0.1 MG tablet Take 1 tablet (0.1 mg total) by mouth 3 (three) times  daily. 60 tablet 11  . escitalopram (LEXAPRO) 10 MG tablet Take 10 mg by mouth daily.    . ferrous sulfate 325 (65 FE) MG tablet Take 1 tablet (325 mg total) by mouth 3 (three) times daily with meals. 90 tablet 3  . furosemide (LASIX) 40 MG tablet TK 1 T PO TID - USE WITH 80MG  T FOR TOTAL DAILY DOSE OF 120MG  TID  3  . furosemide (LASIX) 80 MG tablet Take 1 tablet (80 mg total) by mouth daily. 30 tablet 1  . hydrALAZINE (APRESOLINE) 100 MG tablet Take 1 tablet (100 mg total) by mouth 3 (three) times daily. 30 tablet 1    . HYDROcodone-acetaminophen (NORCO) 5-325 MG tablet Take 1 tablet by mouth every 6 (six) hours as needed for moderate pain. 8 tablet 0  . pantoprazole (PROTONIX) 40 MG tablet Take 1 tablet (40 mg total) by mouth daily. 30 tablet 1  . vitamin B-12 1000 MCG tablet Take 1 tablet (1,000 mcg total) by mouth daily. 30 tablet 1   No current facility-administered medications for this visit.     REVIEW OF SYSTEMS:  [X]  denotes positive finding, [ ]  denotes negative finding Cardiac  Comments:  Chest pain or chest pressure:    Shortness of breath upon exertion:    Short of breath when lying flat:    Irregular heart rhythm:        Vascular    Pain in calf, thigh, or hip brought on by ambulation:    Pain in feet at night that wakes you up from your sleep:     Blood clot in your veins:    Leg swelling:         Pulmonary    Oxygen at home:    Productive cough:     Wheezing:         Neurologic    Sudden weakness in arms or legs:     Sudden numbness in arms or legs:     Sudden onset of difficulty speaking or slurred speech:    Temporary loss of vision in one eye:     Problems with dizziness:         Gastrointestinal    Blood in stool:     Vomited blood:         Genitourinary    Burning when urinating:     Blood in urine:        Psychiatric    Major depression:         Hematologic    Bleeding problems:    Problems with blood clotting too easily:        Skin    Rashes or ulcers:        Constitutional    Fever or chills:      PHYSICAL EXAM: Vitals:   01/21/18 0835 01/21/18 0839  BP: (!) 245/119 (!) 242/123  Pulse: 70 70  Resp: 16   Temp: (!) 97.3 F (36.3 C)   TempSrc: Oral   SpO2: 98%   Weight: 117 kg   Height: 5\' 7"  (1.702 m)     GENERAL: The patient is a well-nourished female, in no acute distress. The vital signs are documented above. CARDIAC: There is a regular rate and rhythm.  VASCULAR:  2+ palpable radial and ulnar pulse bilateral upper extremity 2+  palpable brachial pulse bilateral upper extremity No thrill in right brachiocephalic fistula, incisions well healed. PULMONARY: No respiratory distress. ABDOMEN: Soft and non-tender with normal pitched bowel sounds.  MUSCULOSKELETAL: There are no major deformities or cyanosis. NEUROLOGIC: No focal weakness or paresthesias are detected. SKIN: There are no ulcers or rashes noted. PSYCHIATRIC: The patient has a normal affect.  DATA:   I reviewed her previous vein mapping from June 2019 and she appears to have a nice basilic vein in the right arm >4 mm.  Assessment/Plan:  Discussed with Ms. Krebbs her right brachiocephalic fistula is thrombosed after previous revision with superficialization by Dr. Bridgett Larsson.  I reviewed her previous vein mapping from June 2019and she had evidence of a decent basilic vein in the right arm.  I did use a Sonosite [place] in clinic and just above her antecubitum in her right arm she appears to have a 4 mm basilic vein.  I recommended a right upper extremity brachiobasilic fistula at next available date.  We discussed this would be a two-stage operation pending maturation of the vein before proceeding with stage II. On another note the patient had a blood pressure of 245/119 in clinic.  She states she has not taken her medicines today and her pressure normally runs 170s at home.  She denied any headaches chest pain or any other symptoms.  I discussed she should immediately take her blood pressure medicines when she gets home and then recheck her pressure.  If that does not improve she should go to the ED for evaluation.  Patient voiced understanding.   Marty Heck, MD [facility] Office: [phone] Pager: [place]

## 2018-01-22 ENCOUNTER — Other Ambulatory Visit: Payer: Self-pay | Admitting: *Deleted

## 2018-02-06 ENCOUNTER — Encounter (HOSPITAL_COMMUNITY): Payer: Self-pay | Admitting: *Deleted

## 2018-02-06 ENCOUNTER — Other Ambulatory Visit: Payer: Self-pay

## 2018-02-06 NOTE — Anesthesia Preprocedure Evaluation (Addendum)
Anesthesia Evaluation  Patient identified by MRN, date of birth, ID band Patient awake    Reviewed: Allergy & Precautions, NPO status , Patient's Chart, lab work & pertinent test results, reviewed documented beta blocker date and time   Airway Mallampati: III  TM Distance: >3 FB Neck ROM: Full    Dental no notable dental hx.    Pulmonary neg pulmonary ROS,    Pulmonary exam normal breath sounds clear to auscultation       Cardiovascular hypertension, Pt. on medications and Pt. on home beta blockers Normal cardiovascular exam Rhythm:Regular Rate:Normal  ECG: SB, Rate 53  ECHO: LV EF: 55% -   60%  Loop recorder  Sees cardiologist Gwenlyn Found)   Neuro/Psych Anxiety CVA, No Residual Symptoms    GI/Hepatic negative GI ROS, Neg liver ROS,   Endo/Other  Morbid obesity  Renal/GU ESRFRenal disease     Musculoskeletal negative musculoskeletal ROS (+)   Abdominal (+) + obese,   Peds  Hematology  (+) anemia , HLD   Anesthesia Other Findings chronic kidney disease  Reproductive/Obstetrics hcg negative                            Anesthesia Physical Anesthesia Plan  ASA: III  Anesthesia Plan: MAC   Post-op Pain Management:    Induction: Intravenous  PONV Risk Score and Plan: 2 and Propofol infusion, Ondansetron and Dexamethasone  Airway Management Planned: Simple Face Mask  Additional Equipment:   Intra-op Plan:   Post-operative Plan:   Informed Consent: I have reviewed the patients History and Physical, chart, labs and discussed the procedure including the risks, benefits and alternatives for the proposed anesthesia with the patient or authorized representative who has indicated his/her understanding and acceptance.   Dental advisory given  Plan Discussed with: CRNA  Anesthesia Plan Comments:       Anesthesia Quick Evaluation

## 2018-02-07 ENCOUNTER — Encounter (HOSPITAL_COMMUNITY): Payer: Self-pay | Admitting: *Deleted

## 2018-02-07 ENCOUNTER — Encounter (HOSPITAL_COMMUNITY)
Admission: RE | Disposition: A | Discharge status: Home / Self Care | Payer: Self-pay | Source: Ambulatory Visit | Attending: Vascular Surgery

## 2018-02-07 ENCOUNTER — Ambulatory Visit (HOSPITAL_COMMUNITY): Payer: Medicaid Other | Admitting: Anesthesiology

## 2018-02-07 ENCOUNTER — Telehealth: Payer: Self-pay | Admitting: Vascular Surgery

## 2018-02-07 ENCOUNTER — Ambulatory Visit (HOSPITAL_COMMUNITY)
Admission: RE | Admit: 2018-02-07 | Discharge: 2018-02-07 | Disposition: A | Discharge status: Home / Self Care | Payer: Medicaid Other | Source: Ambulatory Visit | Attending: Vascular Surgery | Admitting: Vascular Surgery

## 2018-02-07 DIAGNOSIS — Z6841 Body Mass Index (BMI) 40.0 and over, adult: Secondary | ICD-10-CM | POA: Insufficient documentation

## 2018-02-07 DIAGNOSIS — Z8673 Personal history of transient ischemic attack (TIA), and cerebral infarction without residual deficits: Secondary | ICD-10-CM | POA: Diagnosis not present

## 2018-02-07 DIAGNOSIS — I120 Hypertensive chronic kidney disease with stage 5 chronic kidney disease or end stage renal disease: Secondary | ICD-10-CM | POA: Insufficient documentation

## 2018-02-07 DIAGNOSIS — N186 End stage renal disease: Secondary | ICD-10-CM | POA: Diagnosis not present

## 2018-02-07 DIAGNOSIS — I471 Supraventricular tachycardia: Secondary | ICD-10-CM | POA: Diagnosis not present

## 2018-02-07 DIAGNOSIS — E785 Hyperlipidemia, unspecified: Secondary | ICD-10-CM | POA: Insufficient documentation

## 2018-02-07 DIAGNOSIS — F419 Anxiety disorder, unspecified: Secondary | ICD-10-CM | POA: Insufficient documentation

## 2018-02-07 DIAGNOSIS — Z7982 Long term (current) use of aspirin: Secondary | ICD-10-CM | POA: Insufficient documentation

## 2018-02-07 DIAGNOSIS — Z8249 Family history of ischemic heart disease and other diseases of the circulatory system: Secondary | ICD-10-CM | POA: Insufficient documentation

## 2018-02-07 DIAGNOSIS — N185 Chronic kidney disease, stage 5: Secondary | ICD-10-CM

## 2018-02-07 HISTORY — PX: AV FISTULA PLACEMENT: SHX1204

## 2018-02-07 LAB — POCT I-STAT 4, (NA,K, GLUC, HGB,HCT)
Glucose, Bld: 101 mg/dL — ABNORMAL HIGH (ref 70–99)
HEMATOCRIT: 31 % — AB (ref 36.0–46.0)
HEMOGLOBIN: 10.5 g/dL — AB (ref 12.0–15.0)
Potassium: 3.2 mmol/L — ABNORMAL LOW (ref 3.5–5.1)
SODIUM: 144 mmol/L (ref 135–145)

## 2018-02-07 LAB — POCT PREGNANCY, URINE: Preg Test, Ur: NEGATIVE

## 2018-02-07 SURGERY — ARTERIOVENOUS (AV) FISTULA CREATION
Anesthesia: Monitor Anesthesia Care | Site: Arm Upper | Laterality: Right

## 2018-02-07 MED ORDER — PROPOFOL 10 MG/ML IV BOLUS
INTRAVENOUS | Status: AC
  Filled 2018-02-07: qty 20

## 2018-02-07 MED ORDER — OXYCODONE HCL 5 MG/5ML PO SOLN: 5.0000 mg | Freq: Once | ORAL | Status: DC | PRN

## 2018-02-07 MED ORDER — HEPARIN SODIUM (PORCINE) 1000 UNIT/ML IJ SOLN
INTRAMUSCULAR | Status: DC | PRN
  Administered 2018-02-07: 3000 [IU] via INTRAVENOUS

## 2018-02-07 MED ORDER — ONDANSETRON HCL 4 MG/2ML IJ SOLN
INTRAMUSCULAR | Status: AC
  Filled 2018-02-07: qty 2

## 2018-02-07 MED ORDER — PROPOFOL 500 MG/50ML IV EMUL
INTRAVENOUS | Status: DC | PRN
  Administered 2018-02-07: 75 ug/kg/min via INTRAVENOUS

## 2018-02-07 MED ORDER — 0.9 % SODIUM CHLORIDE (POUR BTL) OPTIME
TOPICAL | Status: DC | PRN
  Administered 2018-02-07: 1000 mL

## 2018-02-07 MED ORDER — LIDOCAINE-EPINEPHRINE (PF) 1 %-1:200000 IJ SOLN
INTRAMUSCULAR | Status: DC | PRN
  Administered 2018-02-07: 8 mL

## 2018-02-07 MED ORDER — SODIUM CHLORIDE 0.9 % IV SOLN
INTRAVENOUS | Status: DC | PRN
  Administered 2018-02-07: 25 ug/min via INTRAVENOUS

## 2018-02-07 MED ORDER — HYDRALAZINE HCL 20 MG/ML IJ SOLN
5.0000 mg | INTRAMUSCULAR | Status: DC | PRN
  Administered 2018-02-07 (×2): 5 mg via INTRAVENOUS
  Filled 2018-02-07: qty 1

## 2018-02-07 MED ORDER — SODIUM CHLORIDE 0.9 % IV SOLN: INTRAVENOUS | Status: DC

## 2018-02-07 MED ORDER — DEXMEDETOMIDINE HCL IN NACL 200 MCG/50ML IV SOLN
INTRAVENOUS | Status: DC | PRN
  Administered 2018-02-07 (×2): 8 ug via INTRAVENOUS

## 2018-02-07 MED ORDER — FENTANYL CITRATE (PF) 250 MCG/5ML IJ SOLN
INTRAMUSCULAR | Status: AC
  Filled 2018-02-07: qty 5

## 2018-02-07 MED ORDER — SODIUM CHLORIDE 0.9 % IV SOLN
INTRAVENOUS | Status: DC | PRN
  Administered 2018-02-07: 07:00:00 via INTRAVENOUS

## 2018-02-07 MED ORDER — LIDOCAINE-EPINEPHRINE (PF) 1 %-1:200000 IJ SOLN
INTRAMUSCULAR | Status: AC
  Filled 2018-02-07: qty 30

## 2018-02-07 MED ORDER — HYDROCODONE-ACETAMINOPHEN 5-325 MG PO TABS: 1.0000 | ORAL_TABLET | Freq: Four times a day (QID) | ORAL | 0 refills | Status: DC | PRN

## 2018-02-07 MED ORDER — HYDROMORPHONE HCL 1 MG/ML IJ SOLN
0.2500 mg | INTRAMUSCULAR | Status: DC | PRN
  Administered 2018-02-07: 0.5 mg via INTRAVENOUS

## 2018-02-07 MED ORDER — CEFAZOLIN SODIUM-DEXTROSE 2-4 GM/100ML-% IV SOLN
2.0000 g | INTRAVENOUS | Status: AC
  Administered 2018-02-07: 2 g via INTRAVENOUS

## 2018-02-07 MED ORDER — SODIUM CHLORIDE 0.9 % IV SOLN
INTRAVENOUS | Status: DC | PRN
  Administered 2018-02-07: 500 mL

## 2018-02-07 MED ORDER — MIDAZOLAM HCL 2 MG/2ML IJ SOLN
INTRAMUSCULAR | Status: AC
  Filled 2018-02-07: qty 2

## 2018-02-07 MED ORDER — LIDOCAINE HCL (PF) 1 % IJ SOLN
INTRAMUSCULAR | Status: AC
  Filled 2018-02-07: qty 30

## 2018-02-07 MED ORDER — PROMETHAZINE HCL 25 MG/ML IJ SOLN: 6.2500 mg | INTRAMUSCULAR | Status: DC | PRN

## 2018-02-07 MED ORDER — HEPARIN SODIUM (PORCINE) 1000 UNIT/ML IJ SOLN
INTRAMUSCULAR | Status: AC
  Filled 2018-02-07: qty 1

## 2018-02-07 MED ORDER — SODIUM CHLORIDE 0.9 % IV SOLN
INTRAVENOUS | Status: AC
  Filled 2018-02-07: qty 1.2

## 2018-02-07 MED ORDER — HYDROMORPHONE HCL 1 MG/ML IJ SOLN
INTRAMUSCULAR | Status: AC
  Administered 2018-02-07: 0.5 mg via INTRAVENOUS
  Filled 2018-02-07: qty 1

## 2018-02-07 MED ORDER — MIDAZOLAM HCL 5 MG/5ML IJ SOLN
INTRAMUSCULAR | Status: DC | PRN
  Administered 2018-02-07 (×2): 1 mg via INTRAVENOUS

## 2018-02-07 MED ORDER — LIDOCAINE 2% (20 MG/ML) 5 ML SYRINGE
INTRAMUSCULAR | Status: AC
  Filled 2018-02-07: qty 5

## 2018-02-07 MED ORDER — CEFAZOLIN SODIUM-DEXTROSE 2-4 GM/100ML-% IV SOLN
INTRAVENOUS | Status: AC
  Filled 2018-02-07: qty 100

## 2018-02-07 MED ORDER — OXYCODONE HCL 5 MG PO TABS: 5.0000 mg | ORAL_TABLET | Freq: Once | ORAL | Status: DC | PRN

## 2018-02-07 MED ORDER — FENTANYL CITRATE (PF) 100 MCG/2ML IJ SOLN
INTRAMUSCULAR | Status: DC | PRN
  Administered 2018-02-07: 50 ug via INTRAVENOUS

## 2018-02-07 SURGICAL SUPPLY — 38 items
ARMBAND PINK RESTRICT EXTREMIT (MISCELLANEOUS) ×4 IMPLANT
CANISTER SUCT 3000ML PPV (MISCELLANEOUS) ×2 IMPLANT
CLIP VESOCCLUDE MED 6/CT (CLIP) ×2 IMPLANT
CLIP VESOCCLUDE SM WIDE 6/CT (CLIP) ×2 IMPLANT
COVER PROBE W GEL 5X96 (DRAPES) ×2 IMPLANT
COVER WAND RF STERILE (DRAPES) ×2 IMPLANT
DECANTER SPIKE VIAL GLASS SM (MISCELLANEOUS) ×2 IMPLANT
DERMABOND ADVANCED (GAUZE/BANDAGES/DRESSINGS) ×1
DERMABOND ADVANCED .7 DNX12 (GAUZE/BANDAGES/DRESSINGS) ×1 IMPLANT
ELECT REM PT RETURN 9FT ADLT (ELECTROSURGICAL) ×2
ELECTRODE REM PT RTRN 9FT ADLT (ELECTROSURGICAL) ×1 IMPLANT
GLOVE BIO SURGEON STRL SZ7.5 (GLOVE) ×4 IMPLANT
GLOVE BIOGEL PI IND STRL 6 (GLOVE) ×1 IMPLANT
GLOVE BIOGEL PI IND STRL 6.5 (GLOVE) ×2 IMPLANT
GLOVE BIOGEL PI IND STRL 7.0 (GLOVE) ×1 IMPLANT
GLOVE BIOGEL PI IND STRL 8 (GLOVE) ×1 IMPLANT
GLOVE BIOGEL PI INDICATOR 6 (GLOVE) ×1
GLOVE BIOGEL PI INDICATOR 6.5 (GLOVE) ×2
GLOVE BIOGEL PI INDICATOR 7.0 (GLOVE) ×1
GLOVE BIOGEL PI INDICATOR 8 (GLOVE) ×1
GOWN STRL REUS W/ TWL LRG LVL3 (GOWN DISPOSABLE) ×2 IMPLANT
GOWN STRL REUS W/ TWL XL LVL3 (GOWN DISPOSABLE) ×2 IMPLANT
GOWN STRL REUS W/TWL LRG LVL3 (GOWN DISPOSABLE) ×2
GOWN STRL REUS W/TWL XL LVL3 (GOWN DISPOSABLE) ×2
HEMOSTAT SPONGE AVITENE ULTRA (HEMOSTASIS) IMPLANT
KIT BASIN OR (CUSTOM PROCEDURE TRAY) ×2 IMPLANT
KIT TURNOVER KIT B (KITS) ×2 IMPLANT
NS IRRIG 1000ML POUR BTL (IV SOLUTION) ×2 IMPLANT
PACK CV ACCESS (CUSTOM PROCEDURE TRAY) ×2 IMPLANT
PAD ARMBOARD 7.5X6 YLW CONV (MISCELLANEOUS) ×4 IMPLANT
SUT MNCRL AB 4-0 PS2 18 (SUTURE) ×2 IMPLANT
SUT PROLENE 6 0 BV (SUTURE) ×2 IMPLANT
SUT PROLENE 7 0 BV 1 (SUTURE) ×2 IMPLANT
SUT VIC AB 3-0 SH 27 (SUTURE) ×1
SUT VIC AB 3-0 SH 27X BRD (SUTURE) ×1 IMPLANT
TOWEL GREEN STERILE (TOWEL DISPOSABLE) ×2 IMPLANT
UNDERPAD 30X30 (UNDERPADS AND DIAPERS) ×2 IMPLANT
WATER STERILE IRR 1000ML POUR (IV SOLUTION) ×2 IMPLANT

## 2018-02-07 NOTE — Telephone Encounter (Signed)
sch appt lvm mld ltr 03/25/18 11am Dialysis Duplex 1145am p/o MD

## 2018-02-07 NOTE — Anesthesia Procedure Notes (Signed)
Procedure Name: MAC Date/Time: 02/07/2018 8:43 AM Performed by: Renato Shin, CRNA Pre-anesthesia Checklist: Patient identified, Emergency Drugs available, Suction available and Patient being monitored Patient Re-evaluated:Patient Re-evaluated prior to induction Oxygen Delivery Method: Simple face mask Preoxygenation: Pre-oxygenation with 100% oxygen Induction Type: IV induction Ventilation: Nasal airway inserted- appropriate to patient size Placement Confirmation: positive ETCO2,  CO2 detector and breath sounds checked- equal and bilateral Dental Injury: Teeth and Oropharynx as per pre-operative assessment

## 2018-02-07 NOTE — Telephone Encounter (Signed)
-----   Message from Dagoberto Ligas, PA-C sent at 02/07/2018  9:39 AM EDT -----  Can you schedule an appt for this pt in 4-6 weeks with fistula duplex to see Dr. Carlis Abbott to discuss surgery.  PO R arm 1st stage basilic fistula. Thanks, [facility]

## 2018-02-07 NOTE — Transfer of Care (Signed)
Immediate Anesthesia Transfer of Care Note  Patient: Erin Dean  Procedure(s) Performed: Creation Right arm Brachiocephalic Fistula (Right Arm Upper)  Patient Location: PACU  Anesthesia Type:MAC  Level of Consciousness: awake, alert , oriented and patient cooperative  Airway & Oxygen Therapy: Patient Spontanous Breathing and Patient connected to face mask oxygen  Post-op Assessment: Report given to RN and Post -op Vital signs reviewed and stable  Post vital signs: Reviewed and stable  Last Vitals:  Vitals Value Taken Time  BP 161/85 02/07/2018  9:47 AM  Temp    Pulse 63 02/07/2018  9:50 AM  Resp 25 02/07/2018  9:50 AM  SpO2 100 % 02/07/2018  9:50 AM  Vitals shown include unvalidated device data.  Last Pain:  Vitals:   02/07/18 0947  TempSrc:   PainSc: (P) 0-No pain      Patients Stated Pain Goal: 5 (61/44/31 5400)  Complications: No apparent anesthesia complications

## 2018-02-07 NOTE — Anesthesia Postprocedure Evaluation (Signed)
Anesthesia Post Note  Patient: Erin Dean  Procedure(s) Performed: Creation Right arm Brachiocephalic Fistula (Right Arm Upper)     Patient location during evaluation: PACU Anesthesia Type: MAC Level of consciousness: awake and alert Pain management: pain level controlled Vital Signs Assessment: post-procedure vital signs reviewed and stable Respiratory status: spontaneous breathing, nonlabored ventilation, respiratory function stable and patient connected to nasal cannula oxygen Cardiovascular status: stable and blood pressure returned to baseline Postop Assessment: no apparent nausea or vomiting Anesthetic complications: no    Last Vitals:  Vitals:   02/07/18 1047 02/07/18 1102  BP: 137/76 (!) 143/78  Pulse: (!) 55 (!) 57  Resp: (!) 23 20  Temp:    SpO2: 90% 94%    Last Pain:  Vitals:   02/07/18 1045  TempSrc:   PainSc: 0-No pain                 [name] [name]

## 2018-02-07 NOTE — Op Note (Signed)
OPERATIVE NOTE   PROCEDURE: 1. right first stage basilic vein transposition (brachiobasilic arteriovenous fistula) placement  PRE-OPERATIVE DIAGNOSIS: ESRD  POST-OPERATIVE DIAGNOSIS: same  SURGEON: Marty Heck, MD  ASSISTANT(S): Arlee Muslim, PA  ANESTHESIA: MAC  ESTIMATED BLOOD LOSS: minimal  FINDING(S): 1.  Basilic vein: 4 mm, acceptable 2.  Brachial artery: 3 mm, atherosclerotic disease evident 3.  Venous outflow: faintly palpable thrill  4.  Radial flow: palpable radial pulse  SPECIMEN(S):  none  INDICATIONS:   Erin Dean is a 52 y.o. female who presents with thrombosed right upper extremity brachiocephalic fistula.  She is left hand dominant.  We have subsequently recommended a right brachiobasilic AVF.  The patient is scheduled for right first stage basilic vein transposition.  The patient is aware the risks include but are not limited to: bleeding, infection, steal syndrome, nerve damage, ischemic monomelic neuropathy, failure to mature, and need for additional procedures.  The patient is aware of the risks of the procedure and elects to proceed forward.   DESCRIPTION: After full informed written consent was obtained from the patient, the patient was brought back to the operating room and placed supine upon the operating table.  Prior to induction, the patient received IV antibiotics.   After obtaining adequate anesthesia, the patient was then prepped and draped in the standard fashion for a right arm access procedure.  I turned my attention first to identifying the patient's basilic vein and brachial artery.    Using SonoSite guidance, the location of these vessels were marked out on the skin.   At this point, I injected local anesthetic to obtain a field block of the antecubitum.  In total, I injected about 10 mL of 1% lidocaine without epinephrine.  I made a transverse incision at the level of the antecubitum and dissected through the subcutaneous  tissue and fascia to gain exposure of the brachial artery.  This was noted to be 3 mm in diameter externally.  This was dissected out proximally and distally and controlled with vessel loops .  I then dissected out the basilic vein.  This was noted to be 4 mm in diameter externally.  The distal segment of the vein was ligated with a  2-0 silk and vessel clip and the vein was transected.  The proximal segment was interrogated with serial dilators.  The vein accepted up to a 4 mm dilator without any difficulty.  I then instilled the heparinized saline into the vein and clamped it.  At this point, I reset my exposure of the brachial artery and placed the artery under tension proximally and distally.  I made an arteriotomy with a #11 blade, and then I extended the arteriotomy with a Potts scissor.  I injected heparinized saline proximal and distal to this arteriotomy.  The vein was then sewn to the artery in an end-to-side configuration with a running stitch of 6-0 Prolene.  Prior to completing this anastomosis, I allowed the vein and artery to backbleed.  There was no evidence of clot from any vessels.  I completed the anastomosis in the usual fashion and then released all vessel loops and clamps.    There was a faintly palpable thrill in the venous outflow, and there was a palpable radial pulse.  At this point, I irrigated out the surgical wound.  There was no further active bleeding.  The subcutaneous tissue was reapproximated with a running stitch of 3-0 Vicryl.  The skin was then reapproximated with a running subcuticular  stitch of 4-0 Vicryl.  The skin was then cleaned, dried, and reinforced with Dermabond.  The patient tolerated this procedure well.   COMPLICATIONS: None  CONDITION: Stable   Marty Heck MD [facility] Office: [phone] Pager: [phone]  02/07/2018, 9:33 AM

## 2018-02-07 NOTE — Discharge Instructions (Signed)
° °  [facility] ° °Discharge Instructions ° °AV Fistula or Graft Surgery for Dialysis Access ° °Please refer to the following instructions for your post-procedure care. Your surgeon or physician assistant will discuss any changes with you. ° °Activity ° °You may drive the day following your surgery, if you are comfortable and no longer taking prescription pain medication. Resume full activity as the soreness in your incision resolves. ° °Bathing/Showering ° °You may shower after you go home. Keep your incision dry for 48 hours. Do not soak in a bathtub, hot tub, or swim until the incision heals completely. You may not shower if you have a hemodialysis catheter. ° °Incision Care ° °Clean your incision with mild soap and water after 48 hours. Pat the area dry with a clean towel. You do not need a bandage unless otherwise instructed. Do not apply any ointments or creams to your incision. You may have skin glue on your incision. Do not peel it off. It will come off on its own in about one week. Your arm may swell a bit after surgery. To reduce swelling use pillows to elevate your arm so it is above your heart. Your doctor will tell you if you need to lightly wrap your arm with an ACE bandage. ° °Diet ° °Resume your normal diet. There are not special food restrictions following this procedure. In order to heal from your surgery, it is CRITICAL to get adequate nutrition. Your body requires vitamins, minerals, and protein. Vegetables are the best source of vitamins and minerals. Vegetables also provide the perfect balance of protein. Processed food has little nutritional value, so try to avoid this. ° °Medications ° °Resume taking all of your medications. If your incision is causing pain, you may take over-the counter pain relievers such as acetaminophen (Tylenol). If you were prescribed a stronger pain medication, please be aware these medications can cause nausea and constipation. Prevent  nausea by taking the medication with a snack or meal. Avoid constipation by drinking plenty of fluids and eating foods with high amount of fiber, such as fruits, vegetables, and grains. Do not take Tylenol if you are taking prescription pain medications. ° ° ° ° °Follow up °Your surgeon may want to see you in the office following your access surgery. If so, this will be arranged at the time of your surgery. ° °Please call us immediately for any of the following conditions: ° °Increased pain, redness, drainage (pus) from your incision site °Fever of 101 degrees or higher °Severe or worsening pain at your incision site °Hand pain or numbness. ° °Reduce your risk of vascular disease: ° °Stop smoking. If you would like help, call [facility] at 1-800-QUIT-NOW (1-800-784-8669) or [facility] at 336-586-4000 ° °Manage your cholesterol °Maintain a desired weight °Control your diabetes °Keep your blood pressure down ° °Dialysis ° °It will take several weeks to several months for your new dialysis access to be ready for use. Your surgeon will determine when it is OK to use it. Your nephrologist will continue to direct your dialysis. You can continue to use your Permcath until your new access is ready for use. ° °If you have any questions, please call the office at [phone]. ° °

## 2018-02-07 NOTE — H&P (Signed)
History and Physical Interval Note:  02/07/2018 7:11 AM  Erin Dean  has presented today for surgery, with the diagnosis of chronic kidney disease  The various methods of treatment have been discussed with the patient and family. After consideration of risks, benefits and other options for treatment, the patient has consented to  Procedure(s): ARTERIOVENOUS (AV) FISTULA CREATION ARM (Right) as a surgical intervention .  The patient's history has been reviewed, patient examined, no change in status, stable for surgery.  I have reviewed the patient's chart and labs.  Questions were answered to the patient's satisfaction.   Right upper extremity AVF.  Likely brachiobasilic in 2 stages.  Marty Heck  Patient name: Erin Dean      MRN: [id number]        DOB: 11-24-1965          Sex: female  REASON FOR VISIT: Evaluate for new hemodialysis access  HPI: Erin Dean is a 52 y.o. female with chronic kidney disease not yet on dialysis that presents for new dialysis access evaluation.  Patient previously had a right upper extremity brachiocephalic fistula placed by Dr. Bridgett Larsson on 08/16/2017.  Patient states this initially worked well with a good thrill.  She subsequently underwent superficilization with sidebranch ligation on 10/01/2017.  Unfortunately she was recently seen in clinic by one of our PAs and her fistula had thrombosed and she was having a lot of pain over the fistula site in her right upper arm.  She states the pain is resolved but she still has no thrill in the fistula.  No other upper extremity surgeries other than her prior brachiocephalic fistula placement with revision.  She denies any numbness tingling weakness or any other symptoms in her right hand.  She has not started dialysis at this time.  She is anxious to get a working fistula.  She is left hand dominant.        Past Medical History:  Diagnosis Date  . Chronic kidney disease    see Dr Hollie Salk at [facility]  . Hypertension   . Stroke ([place]) 04/2017   no residual   . SVT (supraventricular tachycardia) ([place])          Past Surgical History:  Procedure Laterality Date  . AV FISTULA PLACEMENT Right 08/16/2017   Procedure: CREATION OF RADIOCEPHALIC VERSUS BRACHIOCEPHALIC ARTERIOVENOUS FISTULA RIGHT ARM;  Surgeon: Conrad [place], MD;  Location: [facility];  Service: Vascular;  Laterality: Right;  . CESAREAN SECTION    . ESOPHAGOGASTRODUODENOSCOPY N/A 04/19/2017   Procedure: ESOPHAGOGASTRODUODENOSCOPY (EGD);  Surgeon: Carol Ada, MD;  Location: [facility];  Service: Endoscopy;  Laterality: N/A;  . FISTULA SUPERFICIALIZATION Right 10/01/2017   Procedure: FISTULA SUPERFICIALIZATION RIGHT ARM;  Surgeon: Conrad [place], MD;  Location: [facility];  Service: Vascular;  Laterality: Right;  . LOOP RECORDER INSERTION N/A 04/22/2017   Procedure: LOOP RECORDER INSERTION;  Surgeon: Constance Haw, MD;  Location: [facility];  Service: Cardiovascular;  Laterality: N/A;  . TEE WITHOUT CARDIOVERSION N/A 04/19/2017   Procedure: TRANSESOPHAGEAL ECHOCARDIOGRAM (TEE);  Surgeon: Carol Ada, MD;  Location: [facility];  Service: Endoscopy;  Laterality: N/A;         Family History  Problem Relation Age of Onset  . Heart attack Maternal Uncle   . Heart attack Maternal Grandfather   . Stroke Paternal Grandmother   . Cancer Father   . Hypertension Sister     SOCIAL HISTORY: Social History  Tobacco Use  . Smoking status: Never Smoker  . Smokeless tobacco: Never Used  Substance Use Topics  . Alcohol use: No    No Known Allergies        Current Outpatient Medications  Medication Sig Dispense Refill  . amLODipine (NORVASC) 10 MG tablet Take 1 tablet (10 mg total) by mouth daily. 180 tablet 3  . aspirin EC 325 MG EC tablet Take 1 tablet (325 mg total) by mouth daily. 30 tablet 0  . atorvastatin (LIPITOR) 40 MG tablet Take 1 tablet (40 mg total) by mouth daily at 6  PM. 30 tablet 1  . calcitRIOL (ROCALTROL) 0.25 MCG capsule TK 1 C PO D  3  . carvedilol (COREG) 12.5 MG tablet Take 1 tablet (12.5 mg total) by mouth 2 (two) times daily. 30 tablet 1  . cloNIDine (CATAPRES) 0.1 MG tablet Take 1 tablet (0.1 mg total) by mouth 3 (three) times daily. 60 tablet 11  . escitalopram (LEXAPRO) 10 MG tablet Take 10 mg by mouth daily.    . ferrous sulfate 325 (65 FE) MG tablet Take 1 tablet (325 mg total) by mouth 3 (three) times daily with meals. 90 tablet 3  . furosemide (LASIX) 40 MG tablet TK 1 T PO TID - USE WITH 80MG  T FOR TOTAL DAILY DOSE OF 120MG  TID  3  . furosemide (LASIX) 80 MG tablet Take 1 tablet (80 mg total) by mouth daily. 30 tablet 1  . hydrALAZINE (APRESOLINE) 100 MG tablet Take 1 tablet (100 mg total) by mouth 3 (three) times daily. 30 tablet 1  . HYDROcodone-acetaminophen (NORCO) 5-325 MG tablet Take 1 tablet by mouth every 6 (six) hours as needed for moderate pain. 8 tablet 0  . pantoprazole (PROTONIX) 40 MG tablet Take 1 tablet (40 mg total) by mouth daily. 30 tablet 1  . vitamin B-12 1000 MCG tablet Take 1 tablet (1,000 mcg total) by mouth daily. 30 tablet 1   No current facility-administered medications for this visit.     REVIEW OF SYSTEMS:  [X]  denotes positive finding, [ ]  denotes negative finding Cardiac  Comments:  Chest pain or chest pressure:    Shortness of breath upon exertion:    Short of breath when lying flat:    Irregular heart rhythm:        Vascular    Pain in calf, thigh, or hip brought on by ambulation:    Pain in feet at night that wakes you up from your sleep:     Blood clot in your veins:    Leg swelling:         Pulmonary    Oxygen at home:    Productive cough:     Wheezing:         Neurologic    Sudden weakness in arms or legs:     Sudden numbness in arms or legs:     Sudden onset of difficulty speaking or slurred speech:    Temporary loss of vision in one  eye:     Problems with dizziness:         Gastrointestinal    Blood in stool:     Vomited blood:         Genitourinary    Burning when urinating:     Blood in urine:        Psychiatric    Major depression:         Hematologic    Bleeding problems:    Problems with  blood clotting too easily:        Skin    Rashes or ulcers:        Constitutional    Fever or chills:      PHYSICAL EXAM:     Vitals:   01/21/18 0835 01/21/18 0839  BP: (!) 245/119 (!) 242/123  Pulse: 70 70  Resp: 16   Temp: (!) 97.3 F (36.3 C)   TempSrc: Oral   SpO2: 98%   Weight: 117 kg   Height: 5\' 7"  (1.702 m)     GENERAL: The patient is a well-nourished female, in no acute distress. The vital signs are documented above. CARDIAC: There is a regular rate and rhythm.  VASCULAR:  2+ palpable radial and ulnar pulse bilateral upper extremity 2+ palpable brachial pulse bilateral upper extremity No thrill in right brachiocephalic fistula, incisions well healed. PULMONARY: No respiratory distress. ABDOMEN: Soft and non-tender with normal pitched bowel sounds.  MUSCULOSKELETAL: There are no major deformities or cyanosis. NEUROLOGIC: No focal weakness or paresthesias are detected. SKIN: There are no ulcers or rashes noted. PSYCHIATRIC: The patient has a normal affect.  DATA:   I reviewed her previous vein mapping from June 2019 and she appears to have a nice basilic vein in the right arm >4 mm.  Assessment/Plan:  Discussed with Ms. Tadlock her right brachiocephalic fistula is thrombosed after previous revision with superficialization by Dr. Bridgett Larsson.  I reviewed her previous vein mapping from June 2019and she had evidence of a decent basilic vein in the right arm.  I did use a Sonosite [place] in clinic and just above her antecubitum in her right arm she appears to have a 4 mm basilic vein.  I recommended a right upper extremity brachiobasilic  fistula at next available date.  We discussed this would be a two-stage operation pending maturation of the vein before proceeding with stage II. On another note the patient had a blood pressure of 245/119 in clinic.  She states she has not taken her medicines today and her pressure normally runs 170s at home.  She denied any headaches chest pain or any other symptoms.  I discussed she should immediately take her blood pressure medicines when she gets home and then recheck her pressure.  If that does not improve she should go to the ED for evaluation.  Patient voiced understanding.   Marty Heck, MD [facility] Office: [phone] Pager: [phone]

## 2018-02-08 ENCOUNTER — Encounter (HOSPITAL_COMMUNITY): Payer: Self-pay | Admitting: Vascular Surgery

## 2018-02-13 ENCOUNTER — Encounter (HOSPITAL_COMMUNITY): Payer: Self-pay

## 2018-02-14 ENCOUNTER — Other Ambulatory Visit (HOSPITAL_COMMUNITY): Payer: Self-pay | Admitting: *Deleted

## 2018-02-17 ENCOUNTER — Inpatient Hospital Stay (HOSPITAL_COMMUNITY): Admission: RE | Admit: 2018-02-17 | Payer: Self-pay | Source: Ambulatory Visit

## 2018-02-18 ENCOUNTER — Other Ambulatory Visit: Payer: Self-pay

## 2018-02-18 DIAGNOSIS — N185 Chronic kidney disease, stage 5: Secondary | ICD-10-CM

## 2018-02-25 ENCOUNTER — Inpatient Hospital Stay (HOSPITAL_COMMUNITY): Admission: RE | Admit: 2018-02-25 | Payer: Self-pay | Source: Ambulatory Visit

## 2018-02-27 DIAGNOSIS — Z0279 Encounter for issue of other medical certificate: Secondary | ICD-10-CM | POA: Diagnosis not present

## 2018-03-04 ENCOUNTER — Ambulatory Visit (HOSPITAL_COMMUNITY)
Admission: RE | Admit: 2018-03-04 | Discharge: 2018-03-04 | Disposition: A | Discharge status: Home / Self Care | Payer: Medicaid Other | Source: Ambulatory Visit | Attending: Nephrology | Admitting: Nephrology

## 2018-03-04 VITALS — BP 187/78 | HR 62 | Temp 98.6°F | Resp 20 | Ht 68.0 in | Wt 262.0 lb

## 2018-03-04 DIAGNOSIS — N185 Chronic kidney disease, stage 5: Secondary | ICD-10-CM | POA: Diagnosis not present

## 2018-03-04 LAB — POCT HEMOGLOBIN-HEMACUE: HEMOGLOBIN: 9.5 g/dL — AB (ref 12.0–15.0)

## 2018-03-04 MED ORDER — CLONIDINE HCL 0.1 MG PO TABS: 0.1000 mg | ORAL_TABLET | Freq: Once | ORAL | Status: DC

## 2018-03-04 MED ORDER — EPOETIN ALFA-EPBX 10000 UNIT/ML IJ SOLN
40000.0000 [IU] | Freq: Once | INTRAMUSCULAR | Status: AC
  Administered 2018-03-04: 40000 [IU] via SUBCUTANEOUS
  Filled 2018-03-04: qty 4

## 2018-03-04 MED ORDER — SODIUM CHLORIDE 0.9 % IV SOLN
510.0000 mg | Freq: Once | INTRAVENOUS | Status: AC
  Administered 2018-03-04: 510 mg via INTRAVENOUS
  Filled 2018-03-04: qty 510

## 2018-03-04 MED ORDER — EPOETIN ALFA-EPBX 40000 UNIT/ML IJ SOLN: 40000.0000 [IU] | INTRAMUSCULAR | Status: DC

## 2018-03-05 DIAGNOSIS — Z0279 Encounter for issue of other medical certificate: Secondary | ICD-10-CM | POA: Diagnosis not present

## 2018-03-09 ENCOUNTER — Emergency Department (HOSPITAL_COMMUNITY): Payer: Medicaid Other

## 2018-03-09 ENCOUNTER — Encounter (HOSPITAL_COMMUNITY): Payer: Self-pay | Admitting: Emergency Medicine

## 2018-03-09 ENCOUNTER — Inpatient Hospital Stay (HOSPITAL_COMMUNITY): Payer: Medicaid Other

## 2018-03-09 ENCOUNTER — Inpatient Hospital Stay (HOSPITAL_COMMUNITY)
Admission: EM | Admit: 2018-03-09 | Discharge: 2018-03-11 | DRG: 062 | Disposition: A | Discharge status: Home / Self Care | Payer: Medicaid Other | Attending: Neurology | Admitting: Neurology

## 2018-03-09 DIAGNOSIS — I10 Essential (primary) hypertension: Secondary | ICD-10-CM | POA: Diagnosis present

## 2018-03-09 DIAGNOSIS — Z79899 Other long term (current) drug therapy: Secondary | ICD-10-CM

## 2018-03-09 DIAGNOSIS — Z6841 Body Mass Index (BMI) 40.0 and over, adult: Secondary | ICD-10-CM | POA: Diagnosis not present

## 2018-03-09 DIAGNOSIS — E669 Obesity, unspecified: Secondary | ICD-10-CM | POA: Diagnosis present

## 2018-03-09 DIAGNOSIS — E785 Hyperlipidemia, unspecified: Secondary | ICD-10-CM | POA: Diagnosis present

## 2018-03-09 DIAGNOSIS — R4701 Aphasia: Secondary | ICD-10-CM | POA: Diagnosis present

## 2018-03-09 DIAGNOSIS — Z8673 Personal history of transient ischemic attack (TIA), and cerebral infarction without residual deficits: Secondary | ICD-10-CM | POA: Diagnosis not present

## 2018-03-09 DIAGNOSIS — I63412 Cerebral infarction due to embolism of left middle cerebral artery: Principal | ICD-10-CM

## 2018-03-09 DIAGNOSIS — I639 Cerebral infarction, unspecified: Secondary | ICD-10-CM

## 2018-03-09 DIAGNOSIS — F40240 Claustrophobia: Secondary | ICD-10-CM | POA: Diagnosis present

## 2018-03-09 DIAGNOSIS — Z823 Family history of stroke: Secondary | ICD-10-CM | POA: Diagnosis not present

## 2018-03-09 DIAGNOSIS — F419 Anxiety disorder, unspecified: Secondary | ICD-10-CM | POA: Diagnosis present

## 2018-03-09 DIAGNOSIS — I161 Hypertensive emergency: Secondary | ICD-10-CM | POA: Diagnosis present

## 2018-03-09 DIAGNOSIS — I120 Hypertensive chronic kidney disease with stage 5 chronic kidney disease or end stage renal disease: Secondary | ICD-10-CM | POA: Diagnosis present

## 2018-03-09 DIAGNOSIS — R2990 Unspecified symptoms and signs involving the nervous system: Secondary | ICD-10-CM | POA: Diagnosis present

## 2018-03-09 DIAGNOSIS — N185 Chronic kidney disease, stage 5: Secondary | ICD-10-CM | POA: Diagnosis present

## 2018-03-09 LAB — CBC
HCT: 34.6 % — ABNORMAL LOW (ref 36.0–46.0)
Hemoglobin: 9.6 g/dL — ABNORMAL LOW (ref 12.0–15.0)
MCH: 27.2 pg (ref 26.0–34.0)
MCHC: 27.7 g/dL — ABNORMAL LOW (ref 30.0–36.0)
MCV: 98 fL (ref 80.0–100.0)
NRBC: 0.4 % — AB (ref 0.0–0.2)
Platelets: 208 10*3/uL (ref 150–400)
RBC: 3.53 MIL/uL — ABNORMAL LOW (ref 3.87–5.11)
RDW: 19.5 % — AB (ref 11.5–15.5)
WBC: 10.1 10*3/uL (ref 4.0–10.5)

## 2018-03-09 LAB — I-STAT CHEM 8, ED
BUN: 58 mg/dL — ABNORMAL HIGH (ref 6–20)
Calcium, Ion: 1.07 mmol/L — ABNORMAL LOW (ref 1.15–1.40)
Chloride: 111 mmol/L (ref 98–111)
Creatinine, Ser: 6.6 mg/dL — ABNORMAL HIGH (ref 0.44–1.00)
Glucose, Bld: 113 mg/dL — ABNORMAL HIGH (ref 70–99)
HCT: 32 % — ABNORMAL LOW (ref 36.0–46.0)
Hemoglobin: 10.9 g/dL — ABNORMAL LOW (ref 12.0–15.0)
Potassium: 4.1 mmol/L (ref 3.5–5.1)
Sodium: 142 mmol/L (ref 135–145)
TCO2: 21 mmol/L — ABNORMAL LOW (ref 22–32)

## 2018-03-09 LAB — PROTIME-INR
INR: 1.16
PROTHROMBIN TIME: 14.7 s (ref 11.4–15.2)

## 2018-03-09 LAB — APTT: aPTT: 32 seconds (ref 24–36)

## 2018-03-09 LAB — DIFFERENTIAL
Abs Immature Granulocytes: 0.13 10*3/uL — ABNORMAL HIGH (ref 0.00–0.07)
Basophils Absolute: 0.1 10*3/uL (ref 0.0–0.1)
Basophils Relative: 1 %
Eosinophils Absolute: 0.3 10*3/uL (ref 0.0–0.5)
Eosinophils Relative: 3 %
Immature Granulocytes: 1 %
Lymphocytes Relative: 15 %
Lymphs Abs: 1.5 10*3/uL (ref 0.7–4.0)
Monocytes Absolute: 0.8 10*3/uL (ref 0.1–1.0)
Monocytes Relative: 8 %
Neutro Abs: 7.3 10*3/uL (ref 1.7–7.7)
Neutrophils Relative %: 72 %

## 2018-03-09 LAB — COMPREHENSIVE METABOLIC PANEL
ALBUMIN: 3.2 g/dL — AB (ref 3.5–5.0)
ALT: 14 U/L (ref 0–44)
AST: 20 U/L (ref 15–41)
Alkaline Phosphatase: 86 U/L (ref 38–126)
Anion gap: 12 (ref 5–15)
BILIRUBIN TOTAL: 0.5 mg/dL (ref 0.3–1.2)
BUN: 62 mg/dL — ABNORMAL HIGH (ref 6–20)
CO2: 18 mmol/L — ABNORMAL LOW (ref 22–32)
Calcium: 8.3 mg/dL — ABNORMAL LOW (ref 8.9–10.3)
Chloride: 110 mmol/L (ref 98–111)
Creatinine, Ser: 5.87 mg/dL — ABNORMAL HIGH (ref 0.44–1.00)
GFR calc Af Amer: 9 mL/min — ABNORMAL LOW (ref >60)
GFR calc non Af Amer: 8 mL/min — ABNORMAL LOW (ref >60)
GLUCOSE: 117 mg/dL — AB (ref 70–99)
Potassium: 4 mmol/L (ref 3.5–5.1)
Sodium: 140 mmol/L (ref 135–145)
Total Protein: 7.5 g/dL (ref 6.5–8.1)

## 2018-03-09 LAB — CBG MONITORING, ED: Glucose-Capillary: 110 mg/dL — ABNORMAL HIGH (ref 70–99)

## 2018-03-09 LAB — I-STAT TROPONIN, ED: Troponin i, poc: 0.17 ng/mL (ref 0.00–0.08)

## 2018-03-09 LAB — ETHANOL: Alcohol, Ethyl (B): <10 mg/dL (ref <10)

## 2018-03-09 MED ORDER — NICARDIPINE HCL IN NACL 20-0.86 MG/200ML-% IV SOLN
0.0000 mg/h | INTRAVENOUS | Status: DC | PRN
  Filled 2018-03-09: qty 200

## 2018-03-09 MED ORDER — ACETAMINOPHEN 160 MG/5ML PO SOLN: 650.0000 mg | ORAL | Status: DC | PRN

## 2018-03-09 MED ORDER — LABETALOL HCL 5 MG/ML IV SOLN: 10.0000 mg | Freq: Once | INTRAVENOUS | Status: DC | PRN

## 2018-03-09 MED ORDER — PANTOPRAZOLE SODIUM 40 MG IV SOLR
40.0000 mg | Freq: Every day | INTRAVENOUS | Status: DC
  Administered 2018-03-09: 40 mg via INTRAVENOUS
  Filled 2018-03-09: qty 40

## 2018-03-09 MED ORDER — IOPAMIDOL (ISOVUE-370) INJECTION 76%
INTRAVENOUS | Status: AC
  Filled 2018-03-09: qty 100

## 2018-03-09 MED ORDER — ACETAMINOPHEN 650 MG RE SUPP: 650.0000 mg | RECTAL | Status: DC | PRN

## 2018-03-09 MED ORDER — STROKE: EARLY STAGES OF RECOVERY BOOK
Freq: Once | Status: AC
  Administered 2018-03-09: 23:00:00
  Filled 2018-03-09: qty 1

## 2018-03-09 MED ORDER — ESCITALOPRAM OXALATE 10 MG PO TABS
10.0000 mg | ORAL_TABLET | Freq: Every day | ORAL | Status: DC
  Administered 2018-03-10 – 2018-03-11 (×2): 10 mg via ORAL
  Filled 2018-03-09 (×2): qty 1

## 2018-03-09 MED ORDER — LORAZEPAM 2 MG/ML IJ SOLN
INTRAMUSCULAR | Status: AC
  Filled 2018-03-09: qty 1

## 2018-03-09 MED ORDER — ACETAMINOPHEN 325 MG PO TABS
650.0000 mg | ORAL_TABLET | ORAL | Status: DC | PRN
  Administered 2018-03-09 – 2018-03-10 (×2): 650 mg via ORAL
  Filled 2018-03-09 (×2): qty 2

## 2018-03-09 MED ORDER — SODIUM CHLORIDE 0.9 % IV SOLN
50.0000 mL/h | INTRAVENOUS | Status: DC
  Administered 2018-03-09: 50 mL/h via INTRAVENOUS

## 2018-03-09 MED ORDER — SODIUM CHLORIDE 0.9 % IV SOLN
50.0000 mL | Freq: Once | INTRAVENOUS | Status: AC
  Administered 2018-03-09: 50 mL via INTRAVENOUS

## 2018-03-09 MED ORDER — ATORVASTATIN CALCIUM 40 MG PO TABS
40.0000 mg | ORAL_TABLET | Freq: Every day | ORAL | Status: DC
  Administered 2018-03-09 – 2018-03-11 (×3): 40 mg via ORAL
  Filled 2018-03-09 (×4): qty 1

## 2018-03-09 MED ORDER — ALTEPLASE (STROKE) FULL DOSE INFUSION
90.0000 mg | Freq: Once | INTRAVENOUS | Status: AC
  Administered 2018-03-09: 81 mg via INTRAVENOUS
  Filled 2018-03-09: qty 100

## 2018-03-09 NOTE — Progress Notes (Signed)
Patient arrived floor, admitted to room 3W-28  Patient A&O x4, no reports of pain, patient educated on fall and bleeding precautions.   Call bell within reach.  RN will continue to monitor patient

## 2018-03-09 NOTE — Progress Notes (Signed)
Pharmacist Code Stroke Response  Notified to mix tPA at 1535 by Dr. Leonel Ramsay Delivered tPA to RN at 1540  tPA dose = 9mg  bolus over 1 minute followed by 81mg  for a total dose of 90mg  over 1 hour  Issues/delays encountered (if applicable): N/A  Erin Dean, Rande Lawman 03/09/18 3:42 PM

## 2018-03-09 NOTE — H&P (Signed)
Neurology H&P  CC: Aphasia  History is obtained from: Patient, family  HPI: Erin Dean is a 52 y.o. female a history of a previous small left temporal stroke who presents with acute aphasia.  She was in her normal state of health until 2 PM.  She was with her boyfriend at the time and they were decorating for Christmas when she suddenly had an abrupt difficulty with speaking.  He called her daughter who called 79 recognizing the signs of stroke.  EMS did not activate a code stroke for acute aphasia.  In the emergency department, following evaluation by Dr. Ralene Bathe a code stroke was activated and TPA was administered.  She was extremely anxious with lying flat, and very fearful of entering the CT scanner.  Only with extreme coaxing was she able to perform a CT scan, but she refused CTA.   LKW: 2 PM tpa given?:  Yes Modified Rankin Scale: 1-No significant post stroke disability and can perform usual duties with stroke symptoms  ROS:  Unable to obtain due to altered mental status.   Past Medical History:  Diagnosis Date  . Chronic kidney disease    see Dr Hollie Salk at [facility]  . Hypertension   . Stroke ([place]) 04/2017   no residual   . SVT (supraventricular tachycardia) (HCC)      Family History  Problem Relation Age of Onset  . Heart attack Maternal Uncle   . Heart attack Maternal Grandfather   . Stroke Paternal Grandmother   . Cancer Father   . Hypertension Sister      Social History:  reports that she has never smoked. She has never used smokeless tobacco. She reports that she does not drink alcohol or use drugs.   Exam: Current vital signs: BP (!) 153/74   Pulse 60   Temp 98.1 F (36.7 C) (Oral)   Resp 14   Ht 5\' 8"  (1.727 m)   Wt 127.9 kg   SpO2 92%   BMI 42.87 kg/m  Vital signs in last 24 hours: Temp:  [98.1 F (36.7 C)] 98.1 F (36.7 C) (12/01 1501) Pulse Rate:  [60-61] 60 (12/01 1537) Resp:  [14-28] 14 (12/01 1537) BP: (153)/(61-74) 153/74  (12/01 1537) SpO2:  [92 %] 92 % (12/01 1537) Weight:  [127.9 kg] 127.9 kg (12/01 1537)  Physical Exam  Constitutional: Appears well-developed and well-nourished.  Psych: Affect appropriate to situation Eyes: No scleral injection HENT: No OP obstrucion Head: Normocephalic.  Cardiovascular: Normal rate and regular rhythm.  Respiratory: Effort normal and breath sounds normal to anterior ascultation GI: Soft.  No distension. There is no tenderness.  Skin: WDI  Neuro: Mental Status: Patient is awake, alert, she has a significant expressive aphasia, she can say a couple of words, but is very frustrated.  She is completely unable to repeat.  She is able to name some but not all objects Cranial Nerves: II: Visual Fields are full. Pupils are equal, round, and reactive to light.   III,IV, VI: EOMI without ptosis or diploplia.  V: Facial sensation is symmetric to temperature VII: Facial movement with mild decreased right nasolabial fold on the right VIII: hearing is intact to voice X: Uvula elevates symmetrically XI: Shoulder shrug is symmetric. XII: tongue is midline without atrophy or fasciculations.  Motor: Though she has good strength in all 4 extremities, she does have drift in bilateral upper extremities. 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  I discussed risks and benefits of TPA with the patient and with family who both expressed desire to go ahead with  treatment.  My suspicion for a large vessel occlusion large amenable to IR therapy is very low.  I do not think that the risks of intubation in order to get MRA are justified by her current symptoms.  CTA is not an option given her tenuous renal state and she would not even tolerate this if it were an option.  At this point I think holding off on urgent vascular imaging is reasonable and we can consider MRI with sedation tomorrow.  Recommendations: - HgbA1c, fasting lipid panel - MRI, MRA  of the brain without contrast - Frequent neuro checks - Echocardiogram - Carotid dopplers - Prophylactic therapy-none for 24 hours - Risk factor modification - Telemetry monitoring - PT consult, OT consult, Speech consult - Stroke team to follow -Continue home Lexapro, atorvastatin -hold antihypertensives, can use nicardipine if needed   This patient is critically ill and at significant risk of neurological worsening, death and care requires constant monitoring of vital signs, hemodynamics,respiratory and cardiac monitoring, neurological assessment, discussion with family, other specialists and medical decision making of high complexity. I spent 50 minutes of neurocritical care time  in the care of  this patient.  Roland Rack, MD [facility] [phone]  If 7pm- 7am, please page neurology on call as listed in [place]. 03/09/2018  3:41 PM

## 2018-03-09 NOTE — ED Notes (Signed)
Attempted report 

## 2018-03-09 NOTE — Progress Notes (Signed)
Erin Dean is a 52 yo female with a hx of HTN, stroke,SVT. She presents to ED via [facility] with numbness and speech difficulties. NIH 8,CBG 110, LKW 1400. TPA started at 1541. Pt had a difficult time lying flat in CT scanner.

## 2018-03-09 NOTE — ED Provider Notes (Signed)
[place] EMERGENCY DEPARTMENT Provider Note   CSN: [id number] Arrival date & time: 03/09/18  1449     History   Chief Complaint Chief Complaint  Patient presents with  . Code Stroke    HPI Erin Dean is a 52 y.o. female.  The history is provided by the patient and a relative. No language interpreter was used.   Erin Dean is a 52 y.o. female who presents to the Emergency Department complaining of numbness. Level V caveat due to speech difficulties. History is provided by the patient and her daughter. She had a headache around lunchtime today which was about two hours ago. And then about 2 PM she developed difficulty with speech and tingling in her mouth. Symptoms are severe and constant nature. She states that these are similar to her prior stroke one year ago. She reports seeing black spots in front of her eyes. She denies any chest pain, shortness of breath, abdominal pain, nausea, vomiting. She does have chronic kidney disease and is being prepared for dialysis. She had a fistula revision performed at the beginning of November. Past Medical History:  Diagnosis Date  . Chronic kidney disease    see Dr Holl[facility] at [facility]  . Hypertension   . Stroke ([place]) 04/2017   no residual   . SVT (supraventricular tachycardia) [facility])     Patient Active Problem List   Diagnosis Date Noted  . CKD (chronic kidney disease) stage 5, GFR less than 15 ml/min (HCC) 07/31/2017  . Cryptogenic stroke ([place])   . Hypertensive emergency   . Demand ischemia ([place])   . Stroke (cerebrum) ([place]) 04/17/2017  . Essential hypertension 12/01/2015  . Morbid obesity ([place]) 12/01/2015  . Lower extremity edema 12/01/2015  . Heart murmur 12/01/2015  . Hypertensive urgency 09/22/2012  . Anxiety 09/22/2012    Past Surgical History:  Procedure Laterality Date  . AV FISTULA PLACEMENT Right 08/16/2017   Procedure: CREATION OF RADIOCEPHALIC VERSUS BRACHIOCEPHALIC ARTERIOVENOUS  FISTULA RIGHT ARM;  Surgeon: Conrad Rapides, MD;  Location: [place];  Service: Vascular;  Laterality: Right;  . AV FISTULA PLACEMENT Right 02/07/2018   Procedure: Creation Right arm Brachiocephalic Fistula;  Surgeon: Marty Heck, MD;  Location: [place];  Service: Vascular;  Laterality: Right;  . CESAREAN SECTION    . ESOPHAGOGASTRODUODENOSCOPY N/A 04/19/2017   Procedure: ESOPHAGOGASTRODUODENOSCOPY (EGD);  Surgeon: Carol Ada, MD;  Location: [place];  Service: Endoscopy;  Laterality: N/A;  . FISTULA SUPERFICIALIZATION Right 10/01/2017   Procedure: FISTULA SUPERFICIALIZATION RIGHT ARM;  Surgeon: Conrad Leland, MD;  Location: [place];  Service: Vascular;  Laterality: Right;  . LOOP RECORDER INSERTION N/A 04/22/2017   Procedure: LOOP RECORDER INSERTION;  Surgeon: Constance Haw, MD;  Location: [place] CV LAB;  Service: Cardiovascular;  Laterality: N/A;  . TEE WITHOUT CARDIOVERSION N/A 04/19/2017   Procedure: TRANSESOPHAGEAL ECHOCARDIOGRAM (TEE);  Surgeon: Carol Ada, MD;  Location: [place];  Service: Endoscopy;  Laterality: N/A;     OB History   None      Home Medications    Prior to Admission medications   Medication Sig Start Date End Date Taking? Authorizing Provider  acetaminophen (TYLENOL) 500 MG tablet Take 1,000 mg by mouth daily as needed for moderate pain or headache.   Yes [provider]  amLODipine (NORVASC) 10 MG tablet Take 1 tablet (10 mg total) by mouth daily. 05/14/17  Yes Lorretta Harp, MD  aspirin EC 325 MG EC tablet Take  1 tablet (325 mg total) by mouth daily. 04/24/17  Yes Costello, Kayren Eaves, NP  atorvastatin (LIPITOR) 40 MG tablet Take 1 tablet (40 mg total) by mouth daily at 6 PM. 04/23/17  Yes Costello, Kayren Eaves, NP  calcitRIOL (ROCALTROL) 0.25 MCG capsule Take 0.25 mcg by mouth daily.  10/25/17  Yes [provider]  carvedilol (COREG) 25 MG tablet Take 25 mg by mouth 2 (two) times daily with a meal.   Yes [provider]    escitalopram (LEXAPRO) 10 MG tablet Take 10 mg by mouth daily.   Yes [provider]  ferrous sulfate 325 (65 FE) MG tablet Take 1 tablet (325 mg total) by mouth 3 (three) times daily with meals. 04/23/17  Yes Costello, Kayren Eaves, NP  furosemide (LASIX) 40 MG tablet Take 40 mg by mouth See admin instructions. Take 40 mg tablet with 80 mg to equal 120 twice daily then take a third 40 mg dose by itself   Yes [provider]  furosemide (LASIX) 80 MG tablet Take 1 tablet (80 mg total) by mouth daily. Patient taking differently: Take 80 mg by mouth 2 (two) times daily. Take with 40 mg to equal 120 mg twice daily 04/23/17  Yes Costello, Kayren Eaves, NP  hydrALAZINE (APRESOLINE) 100 MG tablet Take 1 tablet (100 mg total) by mouth 3 (three) times daily. 04/23/17  Yes Costello, Kayren Eaves, NP  HYDROcodone-acetaminophen (NORCO) 5-325 MG tablet Take 1 tablet by mouth every 6 (six) hours as needed for up to 12 doses for moderate pain. 02/07/18  Yes Dagoberto Ligas, PA-C  pantoprazole (PROTONIX) 40 MG tablet Take 1 tablet (40 mg total) by mouth daily. 04/24/17  Yes Costello, Kayren Eaves, NP  vitamin B-12 1000 MCG tablet Take 1 tablet (1,000 mcg total) by mouth daily. 04/24/17  Yes Costello, Kayren Eaves, NP  carvedilol (COREG) 12.5 MG tablet Take 1 tablet (12.5 mg total) by mouth 2 (two) times daily. Patient not taking: Reported on 02/05/2018 04/23/17   Mary Sella, NP  cloNIDine (CATAPRES) 0.1 MG tablet Take 1 tablet (0.1 mg total) by mouth 3 (three) times daily. 04/23/17   Mary Sella, NP    Family History Family History  Problem Relation Age of Onset  . Heart attack Maternal Uncle   . Heart attack Maternal Grandfather   . Stroke Paternal Grandmother   . Cancer Father   . Hypertension Sister     Social History Social History   Tobacco Use  . Smoking status: Never Smoker  . Smokeless tobacco: Never Used  Substance Use Topics  . Alcohol use: No  . Drug use: No     Allergies   Patient has no  known allergies.   Review of Systems Review of Systems  All other systems reviewed and are negative.    Physical Exam Updated Vital Signs BP (!) 172/82 (BP Location: Left Arm)   Pulse 60   Temp 98.2 F (36.8 C) (Oral)   Resp (!) 24   Ht 5\' 7"  (1.702 m)   Wt 108 kg   SpO2 96%   BMI 37.29 kg/m   Physical Exam  Constitutional: She appears well-developed and well-nourished.  HENT:  Head: Normocephalic and atraumatic.  Cardiovascular: Normal rate and regular rhythm.  No murmur heard. Pulmonary/Chest: Effort normal and breath sounds normal. No respiratory distress.  Abdominal: Soft. There is no tenderness. There is no rebound and no guarding.  Musculoskeletal: She exhibits no tenderness.  2+ pitting edema to BLE.  Healing surgical wound to RUE.    Neurological: She is alert.  Oriented to person and place.  Disoriented to time.  Mild confusion.  Dysarthria, aphasia.  No asymmetry of facial movement.  5/5 strength in all four extremities with sensation to light touch intact in all four extremities.  No pronator drift.  Visual fields grossly intact.    Skin: Skin is warm and dry.  Psychiatric: She has a normal mood and affect. Her behavior is normal.  Nursing note and vitals reviewed.    ED Treatments / Results  Labs (all labs ordered are listed, but only abnormal results are displayed) Labs Reviewed  CBC - Abnormal; Notable for the following components:      Result Value   RBC 3.53 (*)    Hemoglobin 9.6 (*)    HCT 34.6 (*)    MCHC 27.7 (*)    RDW 19.5 (*)    nRBC 0.4 (*)    All other components within normal limits  DIFFERENTIAL - Abnormal; Notable for the following components:   Abs Immature Granulocytes 0.13 (*)    All other components within normal limits  COMPREHENSIVE METABOLIC PANEL - Abnormal; Notable for the following components:   CO2 18 (*)    Glucose, Bld 117 (*)    BUN 62 (*)    Creatinine, Ser 5.87 (*)    Calcium 8.3 (*)    Albumin 3.2 (*)    GFR  calc non Af Amer 8 (*)    GFR calc Af Amer 9 (*)    All other components within normal limits  I-STAT CHEM 8, ED - Abnormal; Notable for the following components:   BUN 58 (*)    Creatinine, Ser 6.60 (*)    Glucose, Bld 113 (*)    Calcium, Ion 1.07 (*)    TCO2 21 (*)    Hemoglobin 10.9 (*)    HCT 32.0 (*)    All other components within normal limits  I-STAT TROPONIN, ED - Abnormal; Notable for the following components:   Troponin i, poc 0.17 (*)    All other components within normal limits  CBG MONITORING, ED - Abnormal; Notable for the following components:   Glucose-Capillary 110 (*)    All other components within normal limits  ETHANOL  PROTIME-INR  APTT  RAPID URINE DRUG SCREEN, HOSP PERFORMED  URINALYSIS, ROUTINE W REFLEX MICROSCOPIC  CBC  HEMOGLOBIN A1C  LIPID PANEL    EKG EKG Interpretation  Date/Time:  Sunday March 09 2018 14:57:21 EST Ventricular Rate:  60 PR Interval:    QRS Duration: 105 QT Interval:  421 QTC Calculation: 421 R Axis:   85 Text Interpretation:  Sinus rhythm Prolonged PR interval Probable left ventricular hypertrophy Inferior infarct, old Anterolateral Q waves, probably due to LVH Lateral leads are also involved Confirmed by Quintella Reichert [phone]) on 03/09/2018 3:18:33 PM Also confirmed by Quintella Reichert [phone]), editor Philomena Doheny [phone])  on 03/09/2018 4:42:30 PM   Radiology Dg Chest Port 1 View  Result Date: 03/09/2018 CLINICAL DATA:  Left-sided numbness.  Slurred speech. EXAM: PORTABLE CHEST 1 VIEW COMPARISON:  April 20, 2017 FINDINGS: Cardiomegaly. The hila and mediastinum are unremarkable. No pneumothorax. No nodules or masses. Possible mild pulmonary venous congestion without overt edema. IMPRESSION: Cardiomegaly. Suggested pulmonary venous congestion. No overt edema or focal infiltrate. Electronically Signed   By: Dorise Bullion III M.D   On: 03/09/2018 16:32   Ct Head Code Stroke Wo Contrast  Result Date: 03/09/2018 CLINICAL  DATA:  Code stroke.  Aphasia, headache.  History of stroke EXAM: CT HEAD WITHOUT CONTRAST TECHNIQUE: Contiguous axial images were obtained from the base of the skull through the vertex without intravenous contrast. COMPARISON:  MRI head 04/18/2017 FINDINGS: Brain: New hypodensity in the right occipital lobe primarily white matter which could represent acute infarct. Chronic infarct left parietal lobe. Small chronic infarct right high frontal lobe Ventricle size normal.  Negative for acute hemorrhage. Vascular: Negative for hyperdense vessel Skull: Negative Sinuses/Orbits: Negative Other: None ASPECTS ([place] Stroke Program Early CT Score) - Ganglionic level infarction (caudate, lentiform nuclei, internal capsule, insula, M1-M3 cortex): 7 - Supraganglionic infarction (M4-M6 cortex): 3 Total score (0-10 with 10 being normal): 10 IMPRESSION: 1. Hypodensity right occipital white matter may represent infarct of indeterminate age. Not present on MRI of 04/18/2017. This does not appear to correlate with the patient's current symptoms of aphasia. 2. Chronic infarct left parietal lobe. Small chronic infarct high right frontal lobe. Negative for hemorrhage 3. ASPECTS is 10 4. These results were called by telephone at the time of interpretation on 03/09/2018 at 3:41 pm to Dr. Leonel Ramsay, who verbally acknowledged these results. Electronically Signed   By: Franchot Gallo M.D.   On: 03/09/2018 15:42    Procedures Procedures (including critical care time) CRITICAL CARE Performed by: Quintella Reichert   Total critical care time: 35 minutes  Critical care time was exclusive of separately billable procedures and treating other patients.  Critical care was necessary to treat or prevent imminent or life-threatening deterioration.  Critical care was time spent personally by me on the following activities: development of treatment plan with patient and/or surrogate as well as nursing, discussions with consultants,  evaluation of patient's response to treatment, examination of patient, obtaining history from patient or surrogate, ordering and performing treatments and interventions, ordering and review of laboratory studies, ordering and review of radiographic studies, pulse oximetry and re-evaluation of patient's condition.  Medications Ordered in ED Medications  iopamidol (ISOVUE-370) 76 % injection (has no administration in time range)   stroke: mapping our early stages of recovery book (has no administration in time range)  0.9 %  sodium chloride infusion (50 mL/hr Intravenous New Bag/Given 03/09/18 2209)  acetaminophen (TYLENOL) tablet 650 mg (650 mg Oral Given 03/09/18 2048)    Or  acetaminophen (TYLENOL) solution 650 mg ( Per Tube See Alternative 03/09/18 2048)    Or  acetaminophen (TYLENOL) suppository 650 mg ( Rectal See Alternative 03/09/18 2048)  labetalol (NORMODYNE,TRANDATE) injection 10 mg (has no administration in time range)    And  nicardipine (CARDENE) 20mg  in 0.86% saline 231ml IV infusion (0.1 mg/ml) (has no administration in time range)  pantoprazole (PROTONIX) injection 40 mg (has no administration in time range)  atorvastatin (LIPITOR) tablet 40 mg (has no administration in time range)  escitalopram (LEXAPRO) tablet 10 mg (10 mg Oral Not Given 03/09/18 2110)  alteplase (ACTIVASE) 1 mg/mL infusion 90 mg (0 mg Intravenous Stopped 03/09/18 1643)    Followed by  0.9 %  sodium chloride infusion (0 mLs Intravenous Stopped 03/09/18 1800)     Initial Impression / Assessment and Plan / ED Course  I have reviewed the triage vital signs and the nursing notes.  Pertinent labs & imaging results that were available during my care of the patient were reviewed by me and considered in my medical decision making (see chart for details).     Patient with history of CKD, CVA, hypertension here for evaluation of difficulty with speech that started  one hour prior to ED arrival. Code stroke activated on  evaluation at the bedside. He was evaluated by neurology and TPA was administered for acute ischemic stroke.  Troponin is mildly elevated, similar when compared to priors. EKG without acute ischemic changes. Patient with significant orthopnea that delayed CT scan, sats of 90% - will check CXR.  To admit to the neurology service for further evaluation and treatment of acute CVA. Final Clinical Impressions(s) / ED Diagnoses   Final diagnoses:  None    ED Discharge Orders    None       Quintella Reichert, MD 03/09/18 2226

## 2018-03-09 NOTE — ED Triage Notes (Signed)
Pt arrives via EMS from home with reports of Left sided numbness and tingling that started at 1400. Husband reported that her speech was slurred. Reports hx of CVA a year ago with no deficits. Hx has fistula but has not started HD. Pt reports HA.

## 2018-03-09 NOTE — ED Notes (Signed)
Pt speech has begun to improve

## 2018-03-10 ENCOUNTER — Other Ambulatory Visit: Payer: Self-pay

## 2018-03-10 ENCOUNTER — Inpatient Hospital Stay (HOSPITAL_COMMUNITY): Payer: Medicaid Other

## 2018-03-10 ENCOUNTER — Encounter (HOSPITAL_COMMUNITY): Payer: Self-pay

## 2018-03-10 DIAGNOSIS — I639 Cerebral infarction, unspecified: Secondary | ICD-10-CM

## 2018-03-10 DIAGNOSIS — E785 Hyperlipidemia, unspecified: Secondary | ICD-10-CM

## 2018-03-10 DIAGNOSIS — N186 End stage renal disease: Secondary | ICD-10-CM

## 2018-03-10 DIAGNOSIS — I351 Nonrheumatic aortic (valve) insufficiency: Secondary | ICD-10-CM

## 2018-03-10 DIAGNOSIS — I10 Essential (primary) hypertension: Secondary | ICD-10-CM

## 2018-03-10 DIAGNOSIS — I361 Nonrheumatic tricuspid (valve) insufficiency: Secondary | ICD-10-CM

## 2018-03-10 DIAGNOSIS — I370 Nonrheumatic pulmonary valve stenosis: Secondary | ICD-10-CM

## 2018-03-10 LAB — HEMOGLOBIN A1C
Hgb A1c MFr Bld: 5.4 % (ref 4.8–5.6)
Mean Plasma Glucose: 108.28 mg/dL

## 2018-03-10 LAB — BASIC METABOLIC PANEL
Anion gap: 11 (ref 5–15)
BUN: 68 mg/dL — ABNORMAL HIGH (ref 6–20)
CO2: 20 mmol/L — ABNORMAL LOW (ref 22–32)
Calcium: 8 mg/dL — ABNORMAL LOW (ref 8.9–10.3)
Chloride: 110 mmol/L (ref 98–111)
Creatinine, Ser: 6.1 mg/dL — ABNORMAL HIGH (ref 0.44–1.00)
GFR calc Af Amer: 8 mL/min — ABNORMAL LOW (ref >60)
GFR calc non Af Amer: 7 mL/min — ABNORMAL LOW (ref >60)
Glucose, Bld: 101 mg/dL — ABNORMAL HIGH (ref 70–99)
Potassium: 3.9 mmol/L (ref 3.5–5.1)
Sodium: 141 mmol/L (ref 135–145)

## 2018-03-10 LAB — CBC
HCT: 31.9 % — ABNORMAL LOW (ref 36.0–46.0)
Hemoglobin: 8.9 g/dL — ABNORMAL LOW (ref 12.0–15.0)
MCH: 27.1 pg (ref 26.0–34.0)
MCHC: 27.9 g/dL — ABNORMAL LOW (ref 30.0–36.0)
MCV: 97.3 fL (ref 80.0–100.0)
Platelets: 190 10*3/uL (ref 150–400)
RBC: 3.28 MIL/uL — ABNORMAL LOW (ref 3.87–5.11)
RDW: 19.6 % — ABNORMAL HIGH (ref 11.5–15.5)
WBC: 9.4 10*3/uL (ref 4.0–10.5)
nRBC: 0.3 % — ABNORMAL HIGH (ref 0.0–0.2)

## 2018-03-10 LAB — LIPID PANEL
Cholesterol: 117 mg/dL (ref 0–200)
HDL: 27 mg/dL — ABNORMAL LOW (ref >40)
LDL Cholesterol: 77 mg/dL (ref 0–99)
TRIGLYCERIDES: 67 mg/dL (ref <150)
Total CHOL/HDL Ratio: 4.3 RATIO
VLDL: 13 mg/dL (ref 0–40)

## 2018-03-10 LAB — ECHOCARDIOGRAM COMPLETE
Height: 67 in
Weight: 3809.55 oz

## 2018-03-10 MED ORDER — PANTOPRAZOLE SODIUM 40 MG PO TBEC
40.0000 mg | DELAYED_RELEASE_TABLET | Freq: Every day | ORAL | Status: DC
  Administered 2018-03-10: 40 mg via ORAL
  Filled 2018-03-10: qty 1

## 2018-03-10 MED ORDER — AMLODIPINE BESYLATE 10 MG PO TABS
10.0000 mg | ORAL_TABLET | Freq: Every day | ORAL | Status: DC
  Administered 2018-03-10 – 2018-03-11 (×2): 10 mg via ORAL
  Filled 2018-03-10 (×2): qty 1

## 2018-03-10 MED ORDER — LORAZEPAM 2 MG/ML IJ SOLN
1.0000 mg | Freq: Once | INTRAMUSCULAR | Status: AC
  Administered 2018-03-10: 2 mg via INTRAVENOUS
  Filled 2018-03-10: qty 1

## 2018-03-10 MED ORDER — ASPIRIN EC 81 MG PO TBEC
81.0000 mg | DELAYED_RELEASE_TABLET | Freq: Every day | ORAL | Status: DC
  Administered 2018-03-10 – 2018-03-11 (×2): 81 mg via ORAL
  Filled 2018-03-10 (×2): qty 1

## 2018-03-10 MED ORDER — CLOPIDOGREL BISULFATE 75 MG PO TABS
75.0000 mg | ORAL_TABLET | Freq: Every day | ORAL | Status: DC
  Administered 2018-03-10 – 2018-03-11 (×2): 75 mg via ORAL
  Filled 2018-03-10 (×2): qty 1

## 2018-03-10 MED ORDER — HYDRALAZINE HCL 50 MG PO TABS
50.0000 mg | ORAL_TABLET | Freq: Three times a day (TID) | ORAL | Status: DC
  Administered 2018-03-10 – 2018-03-11 (×5): 50 mg via ORAL
  Filled 2018-03-10 (×5): qty 1

## 2018-03-10 MED ORDER — HYDRALAZINE HCL 100 MG PO TABS: 100.0000 mg | ORAL_TABLET | Freq: Three times a day (TID) | ORAL | Status: DC

## 2018-03-10 MED ORDER — CARVEDILOL 12.5 MG PO TABS
25.0000 mg | ORAL_TABLET | Freq: Two times a day (BID) | ORAL | Status: DC
  Administered 2018-03-10 – 2018-03-11 (×4): 25 mg via ORAL
  Filled 2018-03-10 (×4): qty 2

## 2018-03-10 MED ORDER — FUROSEMIDE 80 MG PO TABS
80.0000 mg | ORAL_TABLET | Freq: Two times a day (BID) | ORAL | Status: DC
  Administered 2018-03-10 – 2018-03-11 (×4): 80 mg via ORAL
  Filled 2018-03-10 (×4): qty 1

## 2018-03-10 MED ORDER — VITAMIN B-12 1000 MCG PO TABS
1000.0000 ug | ORAL_TABLET | Freq: Every day | ORAL | Status: DC
  Administered 2018-03-10 – 2018-03-11 (×2): 1000 ug via ORAL
  Filled 2018-03-10 (×2): qty 1

## 2018-03-10 NOTE — Evaluation (Signed)
Speech Language Pathology Evaluation Patient Details Name: Erin Dean MRN: [id number] DOB: 01-02-1966 Today's Date: 03/10/2018 Time: 5284-1324 SLP Time Calculation (min) (ACUTE ONLY): 13 min  Problem List:  Patient Active Problem List   Diagnosis Date Noted  . CKD (chronic kidney disease) stage 5, GFR less than 15 ml/min (HCC) 07/31/2017  . Cryptogenic stroke ([place])   . Hypertensive emergency   . Demand ischemia ([place])   . Stroke (cerebrum) ([place]) 04/17/2017  . Essential hypertension 12/01/2015  . Morbid obesity ([place]) 12/01/2015  . Lower extremity edema 12/01/2015  . Heart murmur 12/01/2015  . Hypertensive urgency 09/22/2012  . Anxiety 09/22/2012   Past Medical History:  Past Medical History:  Diagnosis Date  . Chronic kidney disease    see [facility] Holl[facility]  . Hypertension   . Stroke ([place]) 04/2017   no residual   . SVT (supraventricular tachycardia) ([place])    Past Surgical History:  Past Surgical History:  Procedure Laterality Date  . AV FISTULA PLACEMENT Right 08/16/2017   Procedure: CREATION OF RADIOCEPHALIC VERSUS BRACHIOCEPHALIC ARTERIOVENOUS FISTULA RIGHT ARM;  Surgeon: Conrad Campo Bonito, MD;  Location: [place];  Service: Vascular;  Laterality: Right;  . AV FISTULA PLACEMENT Right 02/07/2018   Procedure: Creation Right arm Brachiocephalic Fistula;  Surgeon: Marty Heck, MD;  Location: [place];  Service: Vascular;  Laterality: Right;  . CESAREAN SECTION    . ESOPHAGOGASTRODUODENOSCOPY N/A 04/19/2017   Procedure: ESOPHAGOGASTRODUODENOSCOPY (EGD);  Surgeon: Carol Ada, MD;  Location: [place];  Service: Endoscopy;  Laterality: N/A;  . FISTULA SUPERFICIALIZATION Right 10/01/2017   Procedure: FISTULA SUPERFICIALIZATION RIGHT ARM;  Surgeon: Conrad Hawley, MD;  Location: [place];  Service: Vascular;  Laterality: Right;  . LOOP RECORDER INSERTION N/A 04/22/2017   Procedure: LOOP RECORDER INSERTION;  Surgeon: Constance Haw, MD;  Location: [place] CV  LAB;  Service: Cardiovascular;  Laterality: N/A;  . TEE WITHOUT CARDIOVERSION N/A 04/19/2017   Procedure: TRANSESOPHAGEAL ECHOCARDIOGRAM (TEE);  Surgeon: Carol Ada, MD;  Location: [place];  Service: Endoscopy;  Laterality: N/A;   HPI:  Patient is a 52 y/o female presenting to the ED on 03/09/18 with primary complaints of aphasia. Chronic infarct left parietal lobe. Small chronic infarct high right frontal lobe. Negative for hemorrhage. TPA administered. Past medical history significant for CKD, HTN, CVA, SVT.     Assessment / Plan / Recommendation Clinical Impression  Patient presents with what appears to be normal cognitive-linguistic function based upon completion of the Cognistat cognitive exam. No aphasia noted. Daughter present and reports that patient has returned to baseline. No f/u SLP services indicated at this time however if concerns arise, please reconsult.     SLP Assessment  SLP Recommendation/Assessment: Patient does not need any further Speech Lanaguage Pathology Services SLP Visit Diagnosis: Aphasia (R47.01)    Follow Up Recommendations  None          SLP Evaluation Cognition  Overall Cognitive Status: Within Functional Limits for tasks assessed       Comprehension  Auditory Comprehension Overall Auditory Comprehension: Appears within functional limits for tasks assessed Visual Recognition/Discrimination Discrimination: Within Function Limits Reading Comprehension Reading Status: Within funtional limits    Expression Expression Primary Mode of Expression: Verbal Verbal Expression Overall Verbal Expression: Appears within functional limits for tasks assessed Written Expression Dominant Hand: Left Written Expression: Not tested   Oral / Motor  Oral Motor/Sensory Function Overall Oral Motor/Sensory Function: Within functional limits Motor Speech Overall Motor Speech: Appears within  functional limits for tasks assessed   Gabriel Rainwater MA, CCC-SLP              Puja Caffey Meryl 03/10/2018, 12:06 PM

## 2018-03-10 NOTE — Progress Notes (Signed)
Occupational therapy Evaluation  Occupational Therapy Evaluation  PTA, pt lived at home with her daughter and 2 grandchildren. Pt was driving and independent with ADL and mobility and states she is in the process of getting disability. At baseline, pt's daughter assists with her medication and financial management. Visual fields appear intact. Will further assess and update DC recomendations as appropriate. At this time recommend follow up with outpt OT to maximize independence with IADL tasks.     03/10/18 1000  OT Visit Information  Last OT Received On 03/10/18  Assistance Needed +1  History of Present Illness Patient is a 52 y/o female presenting to the ED on 03/09/18 with primary complaints of aphasia. Chronic infarct left parietal lobe. Small chronic infarct high right frontal lobe. New hypodensity R occipital lobe. MRI pending. Negative for hemorrhage. TPA administered. Past medical history significant for CKD wtih recent AV fistula surgery (not yet on dialysis), HTN, CVA, SVT.   Precautions  Precautions Fall  Restrictions  Weight Bearing Restrictions No  Home Living  Family/patient expects to be discharged to: Private residence  Living Arrangements Children  Available Help at Discharge Family;Available 24 hours/day  Type of Home Apartment  Home Access Level entry  Home Layout One level  Bathroom Shower/Tub Tub/shower unit;Curtain  Corporate treasurer Yes  How Accessible Accessible via walker  Home Equipment None  Prior Function  Level of Independence Independent  Comments currently not wokring - drives, daughter able to assist when needed  Communication  Communication Expressive difficulties (at times difficulty wtih word finding)  Pain Assessment  Pain Assessment No/denies pain  Cognition  Arousal/Alertness Awake/alert  Behavior During Therapy [facility] for tasks assessed/performed;Anxious  Overall Cognitive Status Impaired/Different from baseline   Area of Impairment Attention;Memory  Current Attention Level Selective  Memory Decreased short-term memory  General Comments Pt unabl eto complete serial subtraction of 3 from 100 (states this would not have been difficult while she was working); unable to complete mental math regarding change of .55 from 1.00. Able to complete using paper/pen.   Upper Extremity Assessment  Upper Extremity Assessment Overall WFL for tasks assessed (AV fistula RUE)  Lower Extremity Assessment  Lower Extremity Assessment Defer to PT evaluation  Cervical / Trunk Assessment  Cervical / Trunk Assessment Normal  ADL  Overall ADL's  Needs assistance/impaired  Grooming Set up  Upper Body Bathing Set up  Lower Body Bathing Set up;Supervison/ safety;Sit to/from stand  Upper Body Dressing  Set up;Sitting  Lower Body Dressing Set up;Supervision/safety;Sit to/from Retail buyer Ambulation;Regular Toilet;Supervision/safety  Toileting- Water quality scientist and Hygiene Modified independent  Functional mobility during ADLs Supervision/safety  Vision- History  Baseline Vision/History Wears glasses  Wears Glasses At all times (not present)  Patient Visual Report No change from baseline  Vision- Assessment  Vision Assessment? Yes  Eye Alignment WFL  Ocular Range of Motion [facility]  Alignment/Gaze Preference WDL  Tracking/Visual Pursuits Able to track stimulus in all quads without difficulty  Saccades WFL  Convergence WFL  Visual Fields No apparent deficits (will further assess)  Perception  Comments appeasr WFL  Bed Mobility  Overal bed mobility Needs Assistance  Bed Mobility Supine to Sit;Sit to Supine  Supine to sit Supervision  Sit to supine Supervision  General bed mobility comments no physical assist needed  Transfers  Overall transfer level Needs assistance  Equipment used None  Transfers Sit to/from Stand  Sit to Stand Supervision  Balance  Overall balance assessment Needs assistance  Sitting-balance support No upper extremity supported;Feet supported  Sitting balance-Leahy Scale Good  Standing balance support Single extremity supported;During functional activity  Standing balance-Leahy Scale Fair  OT - End of Session  Equipment Utilized During Treatment Oxygen (2L)  Activity Tolerance Patient tolerated treatment well  Patient left in bed;with call bell/phone within reach;with bed alarm set  Nurse Communication Mobility status  OT Assessment  OT Recommendation/Assessment Patient needs continued OT Services  OT Visit Diagnosis Unsteadiness on feet (R26.81);Other symptoms and signs involving cognitive function  OT Problem List Decreased activity tolerance;Impaired balance (sitting and/or standing);Decreased safety awareness;Decreased knowledge of use of DME or AE;Obesity  OT Plan  OT Frequency (ACUTE ONLY) Min 3X/week  OT Treatment/Interventions (ACUTE ONLY) Self-care/ADL training;Therapeutic activities;Cognitive remediation/compensation;Patient/family education;Energy conservation;DME and/or AE instruction  AM-PAC OT "6 Clicks" Daily Activity Outcome Measure (Version 2)  Help from another person eating meals? 4  Help from another person taking care of personal grooming? 4  Help from another person toileting, which includes using toliet, bedpan, or urinal? 3  Help from another person bathing (including washing, rinsing, drying)? 3  Help from another person to put on and taking off regular upper body clothing? 4  Help from another person to put on and taking off regular lower body clothing? 3  6 Click Score 21  OT Recommendation  Follow Up Recommendations Supervision/Assistance - 24 hour;Outpatient OT  OT Equipment Tub/shower bench  Individuals Consulted  Consulted and Agree with Results and Recommendations Patient  Acute Rehab OT Goals  Patient Stated Goal return home  OT Goal Formulation With patient  Time For Goal Achievement 03/24/18  Potential to Achieve  Goals Good  OT Time Calculation  OT Start Time (ACUTE ONLY) 0945  OT Stop Time (ACUTE ONLY) 1008  OT Time Calculation (min) 23 min  OT General Charges  $OT Visit 1 Visit  OT Evaluation  $OT Eval Moderate Complexity 1 Mod  OT Treatments  $Self Care/Home Management  8-22 mins  Written Expression  Dominant Hand Left  Maurie Boettcher, OT/L   Acute OT Clinical Specialist [place] Pager [phone] Office [phone]

## 2018-03-10 NOTE — Progress Notes (Signed)
Bilateral lower venous duplex completed.  Carotid duplex completed.  Preliminary results in CV Proc.    Erin Dean 03/10/2018 11:33 AM

## 2018-03-10 NOTE — Progress Notes (Signed)
PT Cancellation Note  Patient Details Name: Erin Dean MRN: [id number] DOB: 06/01/1965   Cancelled Treatment:    Reason Eval/Treat Not Completed: Active bedrest order Will need advanced activity orders prior to PT evaluation.   Lanney Gins, PT, DPT Supplemental Physical Therapist 03/10/18 7:46 AM Pager: [phone] Office: [phone]

## 2018-03-10 NOTE — Progress Notes (Signed)
MRI attempted--pt had Meds prior but she still refused to have exam at this time.

## 2018-03-10 NOTE — Progress Notes (Signed)
Patient states that in order to have a MRI/MRA that she will have to be sedated.  States that she can not stand to go into closed spaces and had to be sedated for previous MRIs  RN will advised on coming shift RN

## 2018-03-10 NOTE — Progress Notes (Signed)
OT Cancellation Note  Patient Details Name: Erin Dean MRN: [id number] DOB: [birth year]   Cancelled Treatment:    Reason Eval/Treat Not Completed: Active bedrest order  [place], OT/L   Acute OT Clinical Specialist Acute Rehabilitation Services Pager [phone] Office [phone]  03/10/2018, 8:14 AM

## 2018-03-10 NOTE — Evaluation (Signed)
Physical Therapy Evaluation Patient Details Name: Erin Dean MRN: [id number] DOB: 15-May-1965 Today's Date: 03/10/2018   History of Present Illness  Patient is a 51 y/o female presenting to the ED on 03/09/18 with primary complaints of aphasia. Chronic infarct left parietal lobe. Small chronic infarct high right frontal lobe. Negative for hemorrhage. TPA administered. Past medical history significant for CKD, HTN, CVA, SVT.     Clinical Impression  Ms. Durnell is a pleasant 52 y/o female admitted with the above listed diagnosis. Patient reports that prior to admission she was Mod I with mobility. Patient today requiring general min guard for mobility with use of IV pole for steadying. Noted slight L LE weakness as compared to R LE with patient becoming anxious regarding this. PT to recommend Outpatient Neuro PT at discharge. PT to follow acutely to maximize safe and independent functional mobility prior to return home.     Follow Up Recommendations Supervision - Intermittent;Outpatient PT(neuro PT)    Equipment Recommendations  Other (comment)(will continue to assess)    Recommendations for Other Services OT consult     Precautions / Restrictions Precautions Precautions: Fall Restrictions Weight Bearing Restrictions: No      Mobility  Bed Mobility Overal bed mobility: Needs Assistance Bed Mobility: Supine to Sit;Sit to Supine     Supine to sit: Supervision Sit to supine: Supervision   General bed mobility comments: no physical assist needed  Transfers Overall transfer level: Needs assistance Equipment used: None Transfers: Sit to/from Stand Sit to Stand: Min guard         General transfer comment: for safety and immediate standing balance  Ambulation/Gait Ambulation/Gait assistance: Min guard Gait Distance (Feet): 75 Feet Assistive device: IV Pole Gait Pattern/deviations: Step-through pattern;Decreased stride length;Decreased stance time - left;Shuffle;Trunk  flexed Gait velocity: decreased   General Gait Details: slight "limp" of L LE with reduced weight shifting to this LE  Science writer    Modified Rankin (Stroke Patients Only) Modified Rankin (Stroke Patients Only) Pre-Morbid Rankin Score: No significant disability Modified Rankin: Moderately severe disability     Balance Overall balance assessment: Needs assistance Sitting-balance support: No upper extremity supported;Feet supported Sitting balance-Leahy Scale: Good     Standing balance support: Single extremity supported;During functional activity Standing balance-Leahy Scale: Fair                               Pertinent Vitals/Pain Pain Assessment: No/denies pain    Home Living Family/patient expects to be discharged to:: Private residence Living Arrangements: Children Available Help at Discharge: Family;Available 24 hours/day;Available PRN/intermittently Type of Home: Apartment Home Access: Level entry     Home Layout: One level Home Equipment: None      Prior Function Level of Independence: Independent         Comments: currently not wokring - drives, daughter able to assist when needed     Hand Dominance        Extremity/Trunk Assessment   Upper Extremity Assessment Upper Extremity Assessment: Defer to OT evaluation    Lower Extremity Assessment Lower Extremity Assessment: RLE deficits/detail;LLE deficits/detail RLE Deficits / Details: R LE grossly 5/5 RLE Sensation: WNL LLE Deficits / Details: L LE grossly 4/5 - slight "limp" with gait LLE Sensation: WNL    Cervical / Trunk Assessment Cervical / Trunk Assessment: Normal  Communication   Communication: No difficulties  Cognition Arousal/Alertness:  Awake/alert Behavior During Therapy: WFL for tasks assessed/performed Overall Cognitive Status: Within Functional Limits for tasks assessed                                         General Comments General comments (skin integrity, edema, etc.): patient anxious regarding slight strength deficit of L LE - difficulty with focusing on other tasks due to this    Exercises     Assessment/Plan    PT Assessment Patient needs continued PT services  PT Problem List Decreased strength;Decreased activity tolerance;Decreased balance;Decreased mobility;Decreased safety awareness       PT Treatment Interventions DME instruction;Gait training;Functional mobility training;Therapeutic activities;Therapeutic exercise;Balance training;Neuromuscular re-education;Patient/family education    PT Goals (Current goals can be found in the Care Plan section)  Acute Rehab PT Goals Patient Stated Goal: return home PT Goal Formulation: With patient Time For Goal Achievement: 03/24/18 Potential to Achieve Goals: Good    Frequency Min 4X/week   Barriers to discharge        Co-evaluation               AM-PAC PT "6 Clicks" Mobility  Outcome Measure Help needed turning from your back to your side while in a flat bed without using bedrails?: A Little Help needed moving from lying on your back to sitting on the side of a flat bed without using bedrails?: A Little Help needed moving to and from a bed to a chair (including a wheelchair)?: A Little Help needed standing up from a chair using your arms (e.g., wheelchair or bedside chair)?: A Little Help needed to walk in hospital room?: A Little Help needed climbing 3-5 steps with a railing? : A Little 6 Click Score: 18    End of Session Equipment Utilized During Treatment: Gait belt Activity Tolerance: Patient tolerated treatment well Patient left: in bed;with call bell/phone within reach Nurse Communication: Mobility status PT Visit Diagnosis: Unsteadiness on feet (R26.81);Other abnormalities of gait and mobility (R26.89);Muscle weakness (generalized) (M62.81)    Time: 6808-8110 PT Time Calculation (min) (ACUTE ONLY): 24  min   Charges:   PT Evaluation $PT Eval Moderate Complexity: 1 Mod PT Treatments $Gait Training: 8-22 mins        Lanney Gins, PT, DPT Supplemental Physical Therapist 03/10/18 9:46 AM Pager: [phone] Office: [phone]

## 2018-03-10 NOTE — Progress Notes (Addendum)
STROKE TEAM PROGRESS NOTE   INTERVAL HISTORY No family is at the bedside.  Patient lethargic post sedation for MRI, patient still was not able to tolerate MRI.  Will replace with CT scan.  Vitals:   03/10/18 0559 03/10/18 0631 03/10/18 0731 03/10/18 0831  BP: (!) 189/76 (!) 159/67 (!) 167/92 (!) (P) 169/78  Pulse: (!) 57   (P) 60  Resp: (!) 22   (!) 25  Temp:  98.3 F (36.8 C) 97.7 F (36.5 C)   TempSrc:  Oral Oral   SpO2: 95%   94%  Weight:      Height:        CBC:  Recent Labs  Lab 03/09/18 1532 03/09/18 1535 03/10/18 0407  WBC 10.1  --  9.4  NEUTROABS 7.3  --   --   HGB 9.6* 10.9* 8.9*  HCT 34.6* 32.0* 31.9*  MCV 98.0  --  97.3  PLT 208  --  967    Basic Metabolic Panel:  Recent Labs  Lab 03/09/18 1532 03/09/18 1535 03/10/18 0407  NA 140 142 141  K 4.0 4.1 3.9  CL 110 111 110  CO2 18*  --  20*  GLUCOSE 117* 113* 101*  BUN 62* 58* 68*  CREATININE 5.87* 6.60* 6.10*  CALCIUM 8.3*  --  8.0*   Lipid Panel:     Component Value Date/Time   CHOL 117 03/10/2018 0407   TRIG 67 03/10/2018 0407   HDL 27 (L) 03/10/2018 0407   CHOLHDL 4.3 03/10/2018 0407   VLDL 13 03/10/2018 0407   LDLCALC 77 03/10/2018 0407   HgbA1c:  Lab Results  Component Value Date   HGBA1C 5.4 03/10/2018   Urine Drug Screen: No results found for: LABOPIA, COCAINSCRNUR, LABBENZ, AMPHETMU, THCU, LABBARB  Alcohol Level     Component Value Date/Time   [facility] <10 03/09/2018 1532    IMAGING Dg Chest Port 1 View  Result Date: 03/09/2018 CLINICAL DATA:  Left-sided numbness.  Slurred speech. EXAM: PORTABLE CHEST 1 VIEW COMPARISON:  April 20, 2017 FINDINGS: Cardiomegaly. The hila and mediastinum are unremarkable. No pneumothorax. No nodules or masses. Possible mild pulmonary venous congestion without overt edema. IMPRESSION: Cardiomegaly. Suggested pulmonary venous congestion. No overt edema or focal infiltrate. Electronically Signed   By: Dorise Bullion III M.D   On: 03/09/2018 16:32   Ct  Head Code Stroke Wo Contrast  Result Date: 03/09/2018 CLINICAL DATA:  Code stroke.  Aphasia, headache.  History of stroke EXAM: CT HEAD WITHOUT CONTRAST TECHNIQUE: Contiguous axial images were obtained from the base of the skull through the vertex without intravenous contrast. COMPARISON:  MRI head 04/18/2017 FINDINGS: Brain: New hypodensity in the right occipital lobe primarily white matter which could represent acute infarct. Chronic infarct left parietal lobe. Small chronic infarct right high frontal lobe Ventricle size normal.  Negative for acute hemorrhage. Vascular: Negative for hyperdense vessel Skull: Negative Sinuses/Orbits: Negative Other: None ASPECTS ([place] Stroke Program Early CT Score) - Ganglionic level infarction (caudate, lentiform nuclei, internal capsule, insula, M1-M3 cortex): 7 - Supraganglionic infarction (M4-M6 cortex): 3 Total score (0-10 with 10 being normal): 10 IMPRESSION: 1. Hypodensity right occipital white matter may represent infarct of indeterminate age. Not present on MRI of 04/18/2017. This does not appear to correlate with the patient's current symptoms of aphasia. 2. Chronic infarct left parietal lobe. Small chronic infarct high right frontal lobe. Negative for hemorrhage 3. ASPECTS is 10 4. These results were called by telephone at the time of interpretation on  03/09/2018 at 3:41 pm to Dr. Leonel Ramsay, who verbally acknowledged these results. Electronically Signed   By: Franchot Gallo M.D.   On: 03/09/2018 15:42   2D echocardiogram - Left ventricle: The cavity size was normal. Wall thickness was increased in a pattern of moderate LVH. Systolic function was normal. The estimated ejection fraction was in the range of 55% to 60%. Although no diagnostic regional wall motion abnormality was identified, this possibility cannot be completely excluded on the basis of this study. Features are consistent with a pseudonormal left ventricular filling pattern, with concomitant abnormal  relaxation and increased filling pressure (grade 2 diastolic dysfunction). - Aortic valve: There was mild regurgitation. - Mitral valve: There was moderate regurgitation. - Left atrium: The atrium was severely dilated. - Right ventricle: The cavity size was mildly dilated. Systolic function was reduced. - Right atrium: The atrium was moderately dilated. - Pulmonary arteries: Systolic pressure was mildly increased. PA peak pressure: 37 mm Hg (S). - Pericardium, extracardiac: A trivial pericardial effusion was identified.  Carotid Doppler   There is 1-39% bilateral ICA stenosis. Vertebral artery flow is antegrade.    Lower extremity Dopplers  Negative DVT bilaterally   PHYSICAL EXAM Constitutional: Appears well-developed and well-nourished. Obese AA female Psych: Affect appropriate to situation Eyes: No scleral injection HENT: No OP obstrucion Head: Normocephalic.  Cardiovascular: Normal rate and regular rhythm.  Respiratory: Effort normal and breath sounds normal to anterior ascultation GI: Soft.  No distension. There is no tenderness.  Skin: WDI  Neuro: Mental Status: Patient is lethargic post ativan but will awaken with continued stimulation and says a few words, she can follow simple one-step commands Cranial Nerves: II: she does not blink to threat. Pupils are pinpoint, equal, round, and reactive to light.   III,IV, VI: EOMI without ptosis or diploplia.  V: Facial sensation is symmetric to temperature VII: Facial movement with mild decreased right nasolabial fold on the right VIII: hearing is intact to voice X: deferred XI: deferred XII: tongue is midline without atrophy or fasciculations.  Motor: MAEx4, difficult to assess strength Sensory: Sensation is symmetric to light touch Cerebellar: No clear ataxia on finger-nose-finger   ASSESSMENT/PLAN [name] is a 52 y.o. female with history of previous left temporal stroke who presents with acute aphasia.  Received IV tPA 03/09/2018 at 1541.   Stroke:  left brain infarct s/p IV tPA, likely embolic secondary to unknown source, work-up underway  Code Stroke CT head hypodensity are occipital white matter (age-indeterminate ), old L parietal and high R frontal lobe infarcts.  ASPECTS 10.     MRI  / MRA unable to tolerate w/ sedation  Repeat CT head at 24 hours pending  Carotid Doppler no significant stenosis  2D Echo EF 55-60, no source of embolus, right atrium dilated   Lower extremity Dopplers negative  Loop irrigation negative for A. fib  LDL 77  HgbA1c 5.4  SCDs for VTE prophylaxis  aspirin 325 mg daily prior to admission, now on No antithrombotic as within 24h tPA administration. Plan resume aspirin if 24h imaging neg for hmg  Therapy recommendations:  OP PT  Disposition:  pending   Hypertension  Home meds:  norvasc 10, coreg 25 bid, lasix 80 bid, hydralazine 100 tid, catapress 0.1 tid   Blood pressure per post TPA orders  Resume home Meds  Be aware BP being taken from forearm, may not be reliable - ask RN prior to deciding treatment . Long-term BP goal normotensive  Hyperlipidemia  Home meds:  LIPITOR 40, resumed in hospital  LDL 77, goal < 70  Continue statin at discharge  Other Stroke Risk Factors  Obesity, Body mass index is 37.29 kg/m., recommend weight loss, diet and exercise as appropriate   Hx stroke/TIA  04/2017 - sm L temporal infarct s/p IV tPA, embolic, loop placed  Family hx stroke (Paternal Grandmother)  Other Active Problems  ESRD with planned HD soon - has graft RA  Chronic anemia  Hx SVT  Hospital day # [place], MSN, APRN, ANVP-BC, AGPCNP-BC Advanced Practice Stroke Nurse [place] for Schedule & Pager information 03/10/2018 9:40 AM   ATTENDING NOTE: I reviewed above note and agree with the assessment and plan. Pt was seen and examined.   52 year old female with history of CKD, HTN, SVT,  obesity admitted for acute onset aphasia status post TPA.  She had a stroke in 04/2017 with aphasia, received a TPA, and symptom improved.  Developed upper GI bleeding but EGD negative.  MRI showed left MCA infarct.  MRA showed right VA occlusion.  LDL 129 and A1c 5.2 ESR 67 and CRP negative.  EF 60 to 65%.  Carotid Doppler negative.  TEE also unremarkable and loop recorder placed.  Hypercoagulable work-up negative except homocystine 26.5.  Her blood pressure was high at the time, had nephrology consultation and was treated with BP meds.  During the interval time, her CKD worsened and she had AV fistula placed for dialysis in the near future.  Yesterday, she had acute onset headache associate with aphasia, BP also elevated.  Status post TPA.  CT showed left chronic MCA infarct.  Not able to do CTA head and neck due to elevated creatinine and not on dialysis.  Patient did not tolerate the MRI and MRA due to claustrophobia.  CT head repeat pending after 24 hours TPA.  EF 55 to 60%, carotid Doppler and LE venous Doppler unremarkable.  LDL 77 and A1c 5.4.  Loop recorder interrogation no A. fib.  Creatinine elevated up to 6.60, and hemoglobin dropped from 10.9-8.9.  BP stable on the higher end, around 160/80.  On physical examination, patient mild sleepy after Ativan for MRI attempt, however, easily arousable and orientated x3.  No aphasia, no dysarthria.  Facial symmetrical, tongue in midline.  Moving all extremities symmetrically except left lower extremity 4+/5 proximal and distal.  PT also observed left lower extremity slight weakness and dragging on walking in the hallway.  However, patient presenting symptoms of aphasia seems not go along with left lower extremity weakness.  DDX including complicated migraine, uremic encephalopathy, seizure and hypertensive encephalopathy.  Currently pending CT repeat and 24 hours post TPA, may consider to retry limited MRI with only DWI sequence to evaluate stroke if CT no  definite finding. Will also do EEG to rule out seizure. Will resume antiplatelet once CT repeat shows no bleeding. Continue lipitor.  Currently, resumed part of her home BP meds, will continue monitor BP and may resume full dose of home BP meds if needed.  Continue CBC and BMP monitoring.   Rosalin Hawking, MD PhD Stroke Neurology 03/10/2018 9:33 PM  I spent  35 minutes in total face-to-face time with the patient, more than 50% of which was spent in counseling and coordination of care, reviewing test results, images and medication, and discussing the diagnosis of suspected stroke status post TPA, hypertensive emergency, ESRD, treatment plan and potential prognosis. This patient's care requiresreview of multiple databases, neurological  assessment, discussion with family, other specialists and medical decision making of high complexity.       To contact Stroke Continuity provider, please refer to [email or web address] After hours, contact General Neurology

## 2018-03-11 ENCOUNTER — Inpatient Hospital Stay (HOSPITAL_COMMUNITY): Payer: Medicaid Other

## 2018-03-11 DIAGNOSIS — F419 Anxiety disorder, unspecified: Secondary | ICD-10-CM

## 2018-03-11 DIAGNOSIS — G9340 Encephalopathy, unspecified: Secondary | ICD-10-CM

## 2018-03-11 DIAGNOSIS — Z6837 Body mass index (BMI) 37.0-37.9, adult: Secondary | ICD-10-CM

## 2018-03-11 DIAGNOSIS — Z823 Family history of stroke: Secondary | ICD-10-CM

## 2018-03-11 DIAGNOSIS — N185 Chronic kidney disease, stage 5: Secondary | ICD-10-CM

## 2018-03-11 DIAGNOSIS — E785 Hyperlipidemia, unspecified: Secondary | ICD-10-CM

## 2018-03-11 LAB — CBC
HEMATOCRIT: 31.4 % — AB (ref 36.0–46.0)
Hemoglobin: 9 g/dL — ABNORMAL LOW (ref 12.0–15.0)
MCH: 27.4 pg (ref 26.0–34.0)
MCHC: 28.7 g/dL — ABNORMAL LOW (ref 30.0–36.0)
MCV: 95.7 fL (ref 80.0–100.0)
Platelets: 181 10*3/uL (ref 150–400)
RBC: 3.28 MIL/uL — AB (ref 3.87–5.11)
RDW: 19.8 % — ABNORMAL HIGH (ref 11.5–15.5)
WBC: 8.5 10*3/uL (ref 4.0–10.5)
nRBC: 0.4 % — ABNORMAL HIGH (ref 0.0–0.2)

## 2018-03-11 LAB — BASIC METABOLIC PANEL
Anion gap: 13 (ref 5–15)
BUN: 67 mg/dL — ABNORMAL HIGH (ref 6–20)
CO2: 18 mmol/L — ABNORMAL LOW (ref 22–32)
Calcium: 8.3 mg/dL — ABNORMAL LOW (ref 8.9–10.3)
Chloride: 109 mmol/L (ref 98–111)
Creatinine, Ser: 6.21 mg/dL — ABNORMAL HIGH (ref 0.44–1.00)
GFR calc Af Amer: 8 mL/min — ABNORMAL LOW (ref >60)
GFR calc non Af Amer: 7 mL/min — ABNORMAL LOW (ref >60)
Glucose, Bld: 98 mg/dL (ref 70–99)
Potassium: 4 mmol/L (ref 3.5–5.1)
Sodium: 140 mmol/L (ref 135–145)

## 2018-03-11 MED ORDER — FUROSEMIDE 80 MG PO TABS: 80.0000 mg | ORAL_TABLET | Freq: Two times a day (BID) | ORAL | Status: DC

## 2018-03-11 MED ORDER — HYDRALAZINE HCL 100 MG PO TABS: 100.0000 mg | ORAL_TABLET | Freq: Three times a day (TID) | ORAL | 2 refills | Status: DC

## 2018-03-11 MED ORDER — CLOPIDOGREL BISULFATE 75 MG PO TABS: 75.0000 mg | ORAL_TABLET | Freq: Every day | ORAL | 2 refills | Status: DC

## 2018-03-11 MED ORDER — ASPIRIN 81 MG PO TBEC: 81.0000 mg | DELAYED_RELEASE_TABLET | Freq: Every day | ORAL | 0 refills | Status: AC

## 2018-03-11 MED ORDER — CLONIDINE HCL 0.1 MG PO TABS: 0.1000 mg | ORAL_TABLET | Freq: Three times a day (TID) | ORAL | 11 refills | Status: DC

## 2018-03-11 MED ORDER — FUROSEMIDE 80 MG PO TABS: 80.0000 mg | ORAL_TABLET | Freq: Two times a day (BID) | ORAL | 2 refills | Status: DC

## 2018-03-11 MED ORDER — ESCITALOPRAM OXALATE 10 MG PO TABS: 10.0000 mg | ORAL_TABLET | Freq: Every day | ORAL | 2 refills | Status: DC

## 2018-03-11 MED ORDER — AMLODIPINE BESYLATE 10 MG PO TABS: 10.0000 mg | ORAL_TABLET | Freq: Every day | ORAL | 3 refills | Status: DC

## 2018-03-11 MED ORDER — ACETAMINOPHEN 500 MG PO TABS: 1000.0000 mg | ORAL_TABLET | Freq: Every day | ORAL | 0 refills | Status: DC | PRN

## 2018-03-11 MED ORDER — ATORVASTATIN CALCIUM 40 MG PO TABS: 40.0000 mg | ORAL_TABLET | Freq: Every day | ORAL | 1 refills | Status: DC

## 2018-03-11 MED ORDER — CARVEDILOL 25 MG PO TABS: 25.0000 mg | ORAL_TABLET | Freq: Two times a day (BID) | ORAL | 2 refills | Status: DC

## 2018-03-11 MED ORDER — LORAZEPAM 2 MG/ML IJ SOLN
2.0000 mg | Freq: Once | INTRAMUSCULAR | Status: AC
  Administered 2018-03-11: 2 mg via INTRAVENOUS
  Filled 2018-03-11: qty 1

## 2018-03-11 MED ORDER — CYANOCOBALAMIN 1000 MCG PO TABS: 1000.0000 ug | ORAL_TABLET | Freq: Every day | ORAL | 2 refills | Status: AC

## 2018-03-11 MED ORDER — FERROUS SULFATE 325 (65 FE) MG PO TABS: 325.0000 mg | ORAL_TABLET | Freq: Three times a day (TID) | ORAL | 2 refills | Status: AC

## 2018-03-11 MED ORDER — FUROSEMIDE 40 MG PO TABS: 40.0000 mg | ORAL_TABLET | ORAL | Status: DC

## 2018-03-11 MED ORDER — CALCITRIOL 0.25 MCG PO CAPS: 0.2500 ug | ORAL_CAPSULE | Freq: Every day | ORAL | 2 refills | Status: AC

## 2018-03-11 MED ORDER — FUROSEMIDE 40 MG PO TABS: 40.0000 mg | ORAL_TABLET | ORAL | 2 refills | Status: DC

## 2018-03-11 MED ORDER — PANTOPRAZOLE SODIUM 40 MG PO TBEC: 40.0000 mg | DELAYED_RELEASE_TABLET | Freq: Every day | ORAL | 2 refills | Status: DC

## 2018-03-11 MED ORDER — ASPIRIN 81 MG PO TBEC: 81.0000 mg | DELAYED_RELEASE_TABLET | Freq: Every day | ORAL | Status: DC

## 2018-03-11 NOTE — Progress Notes (Signed)
ELECTROENCEPHALOGRAM REPORT Date of Study:03/11/18  KCL:[id number]   Clinical History:51 y.o. female with history of previous left temporal stroke who presents with acute aphasia. Received IV tPA 03/09/2018 at 1541yesterday, she had acute onset headache associate with aphasia, BP also elevated.  Status post TPA.  CT showed left chronic MCA infarct.  Not able to do CTA head and neck due to elevated creatinine and not on dialysis Pt received 2 mg IV ativan   Technical Summary:  A multichannel digital EEG recording measured by the international 10-20 system with electrodes applied with paste and impedances below 5000 ohms performed in our laboratory with EKG monitoring in an awake and asleep patient.Hyperventilation andPhotic stimulation werenotperformed. The digital EEG was referentially recorded, reformatted, and digitally filtered in a variety of bipolar and referential montages for optimal display.  Description:  The patient is awake and asleep during the recording. During maximal wakefulness, there is a symmetric, medium voltage 10 Hz posterior dominant rhythm that attenuates with eye opening. The record is symmetric. During drowsiness and sleep, there is an increase in theta slowing of the background. Vertex waves and symmetric sleep spindles were seen.At times EEG shows generalized bilateral slowing   There were no electrographic seizures seen.  EKG lead was unremarkable  Impression: This awake and asleep EEG is abnormal due togeneralizedslowing but no electrographic seizures noted.

## 2018-03-11 NOTE — Care Management Note (Signed)
Case Management Note  Patient Details  Name: Erin Dean MRN: [id number] Date of Birth: [birth year]  Subjective/Objective:    Pt admitted with a stroke. She is from home with spouse.  Pt has been out of work for 6 months and no longer has insurance. She is trying for disability. Pt has issues affording her medications.  No transportation issues.                Action/Plan: CM consulted for outpatient therapy. Pt wants to attend at [facility]. Orders in Epic and information on the AVS.  CM provided pt with MATCH letter to assist with the cost of her d/c medications.  CM discussed changing PCP and pt in agreement. CM will get her an appointment with one of [place] in am and call pt with the information. Pt can then use [facility] pharmacy for assistance with meds.  Son is going to provide transport home.   Expected Discharge Date:  03/11/18               Expected Discharge Plan:  OP Rehab  In-House Referral:     Discharge planning Services  CM Consult, [facility], [facility]  Post Acute Care Choice:    Choice offered to:     DME Arranged:    DME Agency:     HH Arranged:    HH Agency:     Status of Service:  Completed, signed off  If discussed at [employer], dates discussed:    Additional Comments:  Pollie Friar, RN 03/11/2018, 5:24 PM

## 2018-03-11 NOTE — Discharge Summary (Addendum)
Stroke Discharge Summary  Patient ID: Erin Dean   MRN: [id number]      DOB: 1966-03-10  Date of Admission: 03/09/2018 Date of Discharge: 03/11/2018  Attending Physician:  Rosalin Hawking, MD, Stroke MD Consultant(s):     none Patient's PCP:  Lois Huxley, PA  DISCHARGE DIAGNOSIS:  Principal Problem:   Stroke-like episode [facility]) s/p tPA Active Problems:   Anxiety   Essential hypertension   Obesity   Cryptogenic stroke ([place])   CKD (chronic kidney disease) stage 5, GFR less than 15 ml/min ([place])   Hyperlipidemia   Family hx-stroke   Past Medical History:  Diagnosis Date  . Chronic kidney disease    see Dr Hollie Salk at [facility]  . Hypertension   . Stroke ([place]) 04/2017   no residual   . SVT (supraventricular tachycardia) ([place])    Past Surgical History:  Procedure Laterality Date  . AV FISTULA PLACEMENT Right 08/16/2017   Procedure: CREATION OF RADIOCEPHALIC VERSUS BRACHIOCEPHALIC ARTERIOVENOUS FISTULA RIGHT ARM;  Surgeon: Conrad Lupton, MD;  Location: [place];  Service: Vascular;  Laterality: Right;  . AV FISTULA PLACEMENT Right 02/07/2018   Procedure: Creation Right arm Brachiocephalic Fistula;  Surgeon: Marty Heck, MD;  Location: [place];  Service: Vascular;  Laterality: Right;  . CESAREAN SECTION    . ESOPHAGOGASTRODUODENOSCOPY N/A 04/19/2017   Procedure: ESOPHAGOGASTRODUODENOSCOPY (EGD);  Surgeon: Carol Ada, MD;  Location: [place];  Service: Endoscopy;  Laterality: N/A;  . FISTULA SUPERFICIALIZATION Right 10/01/2017   Procedure: FISTULA SUPERFICIALIZATION RIGHT ARM;  Surgeon: Conrad Ralls, MD;  Location: [place];  Service: Vascular;  Laterality: Right;  . LOOP RECORDER INSERTION N/A 04/22/2017   Procedure: LOOP RECORDER INSERTION;  Surgeon: Constance Haw, MD;  Location: [place] [facility];  Service: Cardiovascular;  Laterality: N/A;  . TEE WITHOUT CARDIOVERSION N/A 04/19/2017   Procedure: TRANSESOPHAGEAL ECHOCARDIOGRAM (TEE);  Surgeon: Carol Ada, MD;  Location: [place];  Service: Endoscopy;  Laterality: N/A;    Allergies as of 03/11/2018   No Known Allergies     Medication List    TAKE these medications   acetaminophen 500 MG tablet Commonly known as:  TYLENOL Take 1,000 mg by mouth daily as needed for moderate pain or headache.   amLODipine 10 MG tablet Commonly known as:  NORVASC Take 1 tablet (10 mg total) by mouth daily.   aspirin 81 MG EC tablet Take 1 tablet (81 mg total) by mouth daily for 21 days. Stop taking after 3 weeks - continue plavix alone Start taking on:  03/12/2018 What changed:    medication strength  how much to take  additional instructions   atorvastatin 40 MG tablet Commonly known as:  LIPITOR Take 1 tablet (40 mg total) by mouth daily at 6 PM.   calcitRIOL 0.25 MCG capsule Commonly known as:  ROCALTROL Take 0.25 mcg by mouth daily.   carvedilol 25 MG tablet Commonly known as:  COREG Take 25 mg by mouth 2 (two) times daily with a meal. What changed:  Another medication with the same name was removed. Continue taking this medication, and follow the directions you see here.   cloNIDine 0.1 MG tablet Commonly known as:  CATAPRES Take 1 tablet (0.1 mg total) by mouth 3 (three) times daily.   clopidogrel 75 MG tablet Commonly known as:  PLAVIX Take 1 tablet (75 mg total) by mouth daily. Start taking on:  03/12/2018   cyanocobalamin 1000 MCG tablet Take  1 tablet (1,000 mcg total) by mouth daily.   escitalopram 10 MG tablet Commonly known as:  LEXAPRO Take 10 mg by mouth daily.   ferrous sulfate 325 (65 FE) MG tablet Take 1 tablet (325 mg total) by mouth 3 (three) times daily with meals.   furosemide 80 MG tablet Commonly known as:  LASIX Take 1 tablet (80 mg total) by mouth 2 (two) times daily. Take with 40 mg to equal 120 mg twice daily   furosemide 40 MG tablet Commonly known as:  LASIX Take 1 tablet (40 mg total) by mouth See admin instructions. Take 40 mg  tablet with 80 mg to equal 120 twice daily then take a third 40 mg dose by itself   hydrALAZINE 100 MG tablet Commonly known as:  APRESOLINE Take 1 tablet (100 mg total) by mouth 3 (three) times daily.   HYDROcodone-acetaminophen 5-325 MG tablet Commonly known as:  NORCO/VICODIN Take 1 tablet by mouth every 6 (six) hours as needed for up to 12 doses for moderate pain.   pantoprazole 40 MG tablet Commonly known as:  PROTONIX Take 1 tablet (40 mg total) by mouth daily.       LABORATORY STUDIES CBC    Component Value Date/Time   WBC 8.5 03/11/2018 0345   RBC 3.28 (L) 03/11/2018 0345   HGB 9.0 (L) 03/11/2018 0345   HCT 31.4 (L) 03/11/2018 0345   PLT 181 03/11/2018 0345   MCV 95.7 03/11/2018 0345   MCH 27.4 03/11/2018 0345   MCHC 28.7 (L) 03/11/2018 0345   RDW 19.8 (H) 03/11/2018 0345   LYMPHSABS 1.5 03/09/2018 1532   MONOABS 0.8 03/09/2018 1532   EOSABS 0.3 03/09/2018 1532   BASOSABS 0.1 03/09/2018 1532   CMP    Component Value Date/Time   NA 140 03/11/2018 0345   K 4.0 03/11/2018 0345   CL 109 03/11/2018 0345   CO2 18 (L) 03/11/2018 0345   GLUCOSE 98 03/11/2018 0345   BUN 67 (H) 03/11/2018 0345   CREATININE 6.21 (H) 03/11/2018 0345   CREATININE 2.25 (H) 12/23/2015 0924   CALCIUM 8.3 (L) 03/11/2018 0345   PROT 7.5 03/09/2018 1532   ALBUMIN 3.2 (L) 03/09/2018 1532   AST 20 03/09/2018 1532   ALT 14 03/09/2018 1532   ALKPHOS 86 03/09/2018 1532   BILITOT 0.5 03/09/2018 1532   GFRNONAA 7 (L) 03/11/2018 0345   GFRAA 8 (L) 03/11/2018 0345   COAGS Lab Results  Component Value Date   INR 1.16 03/09/2018   INR 1.23 04/19/2017   INR 1.08 04/17/2017   Lipid Panel    Component Value Date/Time   CHOL 117 03/10/2018 0407   TRIG 67 03/10/2018 0407   HDL 27 (L) 03/10/2018 0407   CHOLHDL 4.3 03/10/2018 0407   VLDL 13 03/10/2018 0407   LDLCALC 77 03/10/2018 0407   HgbA1C  Lab Results  Component Value Date   HGBA1C 5.4 03/10/2018   Urinalysis    Component Value  Date/Time   COLORURINE YELLOW 04/20/2017 0210   APPEARANCEUR CLEAR 04/20/2017 0210   LABSPEC 1.014 04/20/2017 0210   PHURINE 5.0 04/20/2017 0210   GLUCOSEU 50 (A) 04/20/2017 0210   HGBUR NEGATIVE 04/20/2017 0210   BILIRUBINUR NEGATIVE 04/20/2017 0210   KETONESUR NEGATIVE 04/20/2017 0210   PROTEINUR 100 (A) 04/20/2017 0210   NITRITE NEGATIVE 04/20/2017 0210   LEUKOCYTESUR NEGATIVE 04/20/2017 0210   Urine Drug Screen No results found for: LABOPIA, COCAINSCRNUR, LABBENZ, AMPHETMU, THCU, LABBARB  Alcohol Level  Component Value Date/Time   [facility] <10 03/09/2018 1532     SIGNIFICANT DIAGNOSTIC STUDIES Ct Head Code Stroke Wo Contrast 03/09/2018 1. Hypodensity right occipital white matter may represent infarct of indeterminate age. Not present on MRI of 04/18/2017. This does not appear to correlate with the patient's current symptoms of aphasia. 2. Chronic infarct left parietal lobe. Small chronic infarct high right frontal lobe. Negative for hemorrhage 3. ASPECTS is 10   Ct Head Wo Contrast 03/10/2018 1. No acute intracranial process. 2. Old LEFT MCA territory infarct. Old small RIGHT frontal/MCA territory infarct. 3. Mild atherosclerosis.   Mr Brain Wo Contrast 03/11/2018 1. No acute intracranial abnormality identified. 2. Stable chronic microvascular ischemic changes, volume loss, and chronic infarcts of the brain.   Dg Chest Port 1 View 03/09/2018 Cardiomegaly. Suggested pulmonary venous congestion. No overt edema or focal infiltrate.   2D echocardiogram - Left ventricle: The cavity size was normal. Wall thickness wasincreased in a pattern of moderate LVH. Systolic function wasnormal. The estimated ejection fraction was in the range of 55%to 60%. Although no diagnostic regional wall motion abnormalitywas identified, this possibility cannot be completely excluded onthe basis of this study. Features are consistent with apseudonormal left ventricular filling pattern, with  concomitantabnormal relaxation and increased filling pressure (grade 2diastolic dysfunction). - Aortic valve: There was mild regurgitation. - Mitral valve: There was moderate regurgitation. - Left atrium: The atrium was severely dilated. - Right ventricle: The cavity size was mildly dilated. Systolicfunction was reduced. - Right atrium: The atrium was moderately dilated. - Pulmonary arteries: Systolic pressure was mildly increased. PApeak pressure: 37 mm Hg (S). - Pericardium, extracardiac: A trivial pericardial effusion wasidentified.  Carotid Doppler   There is 1-39% bilateral ICA stenosis. Vertebral artery flow is antegrade.    Lower extremity Dopplers  Negative DVT bilaterally     HISTORY OF PRESENT ILLNESS NAFISA OLDS is a 52 y.o. female a history of a previous small left temporal stroke who presents with acute aphasia.  She was in her normal state of health until 2 PM on 03/09/2018.  She was with her boyfriend at the time and they were decorating for Christmas when she suddenly had an abrupt difficulty with speaking.  He called her daughter who called 57 recognizing the signs of stroke. EMS did not activate a code stroke for acute aphasia. In the emergency department, following evaluation by Dr. Ralene Bathe a code stroke was activated and TPA was administered. She was extremely anxious with lying flat, and very fearful of entering the CT scanner.  Only with extreme coaxing was she able to perform a CT scan, but she refused CTA. Modified Rankin Scale: 1. She was admitted to 3W progressive care for post tPA evaluation and treatment.     HOSPITAL COURSE Ms. AARNA MIHALKO is a 52 y.o. female with history of previous left temporal stroke who presents with acute aphasia. Received IV tPA 03/09/2018 at 1541.   Stroke-like episode s/p IV tPA, no stroke seen on imaging.  DDX including complicated migraine, uremic encephalopathy, seizure and hypertensive encephalopathy.  Code Stroke CT  head hypodensity are occipital white matter (age-indeterminate ), old L parietal and high R frontal lobe infarcts.  ASPECTS 10.     MRI  / MRA unable to tolerate w/ sedation  Repeat CT head at 24 hours neg hmg, no stroke  MRI - limited DWI w/ ativan - neg for acute stroke  Carotid Doppler no significant stenosis  2D Echo EF 55-60, no source of  embolus, right atrium dilated   Lower extremity Dopplers negative  Loop irrigation negative for A. fib  EEG pending   LDL 77  HgbA1c 5.4  aspirin 325 mg daily prior to admission, now on aspirin 81 mg daily and clopidogrel 75 mg daily x 3 weeks then plavix alone  Therapy recommendations:  OP PT & OT  Disposition:  return home  Hypertension  Home meds:  norvasc 10, coreg 25 bid, lasix 80 bid, hydralazine 100 tid, catapress 0.1 tid   Resumed partial home Meds post tPA, resume full home doses at d/c  BP goal now normotensive  Hyperlipidemia  Home meds:  LIPITOR 40, resumed in hospital  LDL 77, goal < 70  Continue statin at discharge  Other Stroke Risk Factors  Obesity, Body mass index is 37.29 kg/m., recommend weight loss, diet and exercise as appropriate   Hx stroke/TIA ? 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 rate (!) 26, height 5\' 7"  (1.702 m), weight 108 kg, SpO2 92 %. Constitutional: Appears well-developed and well-nourished. Obese AA female Psych: Affect appropriate to situation Eyes: No scleral injection HENT: No OP obstrucion Head: Normocephalic.  Cardiovascular: Normal rate and regular rhythm.  Respiratory: Effort normal and breath sounds normal to anterior ascultation Skin: WDI  Neuro: Mental  Status: Patient is awake and alert, no aphasia.  Cranial Nerves: II: VFF. Pupils are pinpoint, equal, round, and reactive to light.  III,IV, VI: EOMI without ptosis or diploplia.  V: Facial sensation is symmetric to temperature VII: Facial movementsymmetric VIII: hearing is intact to voice X: Uvula elevates symmetrically XI: Shoulder shrug is symmetric. XII: tongue is midline without atrophy or fasciculations.  Motor: MAEx4, strength equal bilaterally 5/5 Sensory:  Sensation is symmetric to light touch Cerebellar:  No clear ataxia on finger-nose-finger Gait: steady  Discharge Diet   Regular thin liquids  DISCHARGE PLAN  Disposition:  D/c home  Outpatient PT and OT   aspirin 81 mg daily and clopidogrel 75 mg daily for secondary stroke prevention x 3 weeks then plavix alone  Ongoing risk factor control by Primary Care Physician at time of discharge  Follow-up Lois Huxley, PA in 2 weeks.  Follow-up in [facility] with Dr. Tish Frederickson in 4 weeks, office to schedule an appointment.   25 minutes were spent preparing discharge.  Burnetta Sabin, MSN, APRN, ANVP-BC, AGPCNP-BC Advanced Practice Stroke Nurse [place] for Schedule & Pager information 03/11/2018 4:56 PM    ATTENDING NOTE: I reviewed above note and agree with the assessment and plan. Pt was seen and examined.   Patient sitting in chair, awake alert, had MRI limited done with p.o. Ativan.  MRI showed no acute infarct.  Repeat CT showed no bleeding 24 hours post TPA.  EEG no seizure but generalized slowing likely due to benzo medication.  Her symptoms this time could be due to complicated migraine, or encephalopathy due to CKD and hypertension.  Resume dual antiplatelet and BP medications.  Continue Lipitor.  Neuro stable, will discharge home with outpatient PT/OT.  Continue monitor her loop recorder and follow-up in clinic with Dr. Leta Baptist in 4 weeks.  Rosalin Hawking, MD  PhD Stroke Neurology  03/11/2018 5:58 PM

## 2018-03-11 NOTE — Progress Notes (Signed)
NURSING PROGRESS NOTE  Erin Dean [id number] Discharge Data: 03/11/2018 6:52 PM Attending Provider: Rosalin Hawking, MD [id number], Mancel Bale, [place]     [name] to be D/C'd Home per MD order.  Discussed with the patient the After Visit Summary and all questions fully answered. All IV's discontinued with no bleeding noted. All belongings returned to patient for patient to take home.   Last Vital Signs:  Blood pressure 135/71, pulse 64, temperature 97.8 F (36.6 C), temperature source Axillary, resp. rate (!) 26, height 5\' 7"  (1.702 m), weight 108 kg, SpO2 92 %.  Discharge Medication List Allergies as of 03/11/2018   No Known Allergies     Medication List    TAKE these medications   acetaminophen 500 MG tablet Commonly known as:  TYLENOL Take 2 tablets (1,000 mg total) by mouth daily as needed for moderate pain or headache.   amLODipine 10 MG tablet Commonly known as:  NORVASC Take 1 tablet (10 mg total) by mouth daily.   aspirin 81 MG EC tablet Take 1 tablet (81 mg total) by mouth daily for 21 days. Stop taking after 3 weeks - continue plavix alone Start taking on:  03/12/2018 What changed:    medication strength  how much to take  additional instructions   atorvastatin 40 MG tablet Commonly known as:  LIPITOR Take 1 tablet (40 mg total) by mouth daily at 6 PM.   calcitRIOL 0.25 MCG capsule Commonly known as:  ROCALTROL Take 1 capsule (0.25 mcg total) by mouth daily.   carvedilol 25 MG tablet Commonly known as:  COREG Take 1 tablet (25 mg total) by mouth 2 (two) times daily with a meal. What changed:  Another medication with the same name was removed. Continue taking this medication, and follow the directions you see here.   cloNIDine 0.1 MG tablet Commonly known as:  CATAPRES Take 1 tablet (0.1 mg total) by mouth 3 (three) times daily.   clopidogrel 75 MG tablet Commonly known as:  PLAVIX Take 1 tablet (75 mg total) by mouth daily. Start taking on:   03/12/2018   cyanocobalamin 1000 MCG tablet Take 1 tablet (1,000 mcg total) by mouth daily.   escitalopram 10 MG tablet Commonly known as:  LEXAPRO Take 1 tablet (10 mg total) by mouth daily.   ferrous sulfate 325 (65 FE) MG tablet Take 1 tablet (325 mg total) by mouth 3 (three) times daily with meals.   furosemide 80 MG tablet Commonly known as:  LASIX Take 1 tablet (80 mg total) by mouth 2 (two) times daily. Take with 40 mg to equal 120 mg twice daily   furosemide 40 MG tablet Commonly known as:  LASIX Take 1 tablet (40 mg total) by mouth See admin instructions. Take 40 mg tablet with 80 mg to equal 120 twice daily then take a third 40 mg dose by itself   hydrALAZINE 100 MG tablet Commonly known as:  APRESOLINE Take 1 tablet (100 mg total) by mouth 3 (three) times daily.   HYDROcodone-acetaminophen 5-325 MG tablet Commonly known as:  NORCO/VICODIN Take 1 tablet by mouth every 6 (six) hours as needed for up to 12 doses for moderate pain.   pantoprazole 40 MG tablet Commonly known as:  PROTONIX Take 1 tablet (40 mg total) by mouth daily.         NURSING PROGRESS NOTE  Erin Dean [id number] Discharge Data: 03/11/2018 6:52 PM Attending Provider: Rosalin Hawking, MD [id number], Mancel Bale, PA  [name] to be D/C'd Home per MD order.  Discussed with the patient the After Visit Summary and all questions fully answered. All IV's discontinued with no bleeding noted. All belongings returned to patient for patient to take home.   Last Vital Signs:  Blood pressure 135/71, pulse 64, temperature 97.8 F (36.6 C), temperature source Axillary, resp. rate (!) 26, height 5\' 7"  (1.702 m), weight 108 kg, SpO2 92 %.  Discharge Medication List Allergies as of 03/11/2018   No Known Allergies     Medication List    TAKE these medications   acetaminophen 500 MG tablet Commonly known as:  TYLENOL Take 2 tablets (1,000 mg total) by mouth daily as needed for moderate pain or  headache.   amLODipine 10 MG tablet Commonly known as:  NORVASC Take 1 tablet (10 mg total) by mouth daily.   aspirin 81 MG EC tablet Take 1 tablet (81 mg total) by mouth daily for 21 days. Stop taking after 3 weeks - continue plavix alone Start taking on:  03/12/2018 What changed:    medication strength  how much to take  additional instructions   atorvastatin 40 MG tablet Commonly known as:  LIPITOR Take 1 tablet (40 mg total) by mouth daily at 6 PM.   calcitRIOL 0.25 MCG capsule Commonly known as:  ROCALTROL Take 1 capsule (0.25 mcg total) by mouth daily.   carvedilol 25 MG tablet Commonly known as:  COREG Take 1 tablet (25 mg total) by mouth 2 (two) times daily with a meal. What changed:  Another medication with the same name was removed. Continue taking this medication, and follow the directions you see here.   cloNIDine 0.1 MG tablet Commonly known as:  CATAPRES Take 1 tablet (0.1 mg total) by mouth 3 (three) times daily.   clopidogrel 75 MG tablet Commonly known as:  PLAVIX Take 1 tablet (75 mg total) by mouth daily. Start taking on:  03/12/2018   cyanocobalamin 1000 MCG tablet Take 1 tablet (1,000 mcg total) by mouth daily.   escitalopram 10 MG tablet Commonly known as:  LEXAPRO Take 1 tablet (10 mg total) by mouth daily.   ferrous sulfate 325 (65 FE) MG tablet Take 1 tablet (325 mg total) by mouth 3 (three) times daily with meals.   furosemide 80 MG tablet Commonly known as:  LASIX Take 1 tablet (80 mg total) by mouth 2 (two) times daily. Take with 40 mg to equal 120 mg twice daily   furosemide 40 MG tablet Commonly known as:  LASIX Take 1 tablet (40 mg total) by mouth See admin instructions. Take 40 mg tablet with 80 mg to equal 120 twice daily then take a third 40 mg dose by itself   hydrALAZINE 100 MG tablet Commonly known as:  APRESOLINE Take 1 tablet (100 mg total) by mouth 3 (three) times daily.   HYDROcodone-acetaminophen 5-325 MG  tablet Commonly known as:  NORCO/VICODIN Take 1 tablet by mouth every 6 (six) hours as needed for up to 12 doses for moderate pain.   pantoprazole 40 MG tablet Commonly known as:  PROTONIX Take 1 tablet (40 mg total) by mouth daily.

## 2018-03-11 NOTE — Progress Notes (Signed)
Occupational Therapy Treatment Patient Details Name: Erin Dean MRN: [id number] DOB: 1965/06/24 Today's Date: 03/11/2018    History of present illness Patient is a 52 y/o female presenting to the ED on 03/09/18 with primary complaints of aphasia. Chronic infarct left parietal lobe. Small chronic infarct high right frontal lobe. New hypodensity R occipital lobe. MRI pending. Negative for hemorrhage. TPA administered. Past medical history significant for CKD wtih recent AV fistula surgery (not yet on dialysis), HTN, CVA, SVT.    OT comments  Pt making improvements with balance and adls. Pt very close to modified independent for basic adls.  Supervision provided today only for safety and this was first time this therapist with this patient.  Pt feels she is less SOB and moving better.  Pt continues to have cognitive deficits in the areas of attn and problem solving in the setting of mathematics. Pt is a HS graduate but could not subtract 9-3 without using her fingers. Pt cannot figure simple change in her head and struggled when writing out the subtraction problems on paper. Pt knew the process but could not figure the math if she could not use her fingers to assist.  This is not normal for her she balances a check book and dealt with higher level math at her job. Will continue to recommend OPOT and family is aware of this finding.    Follow Up Recommendations  Supervision/Assistance - 24 hour;Outpatient OT    Equipment Recommendations  Tub/shower bench;3 in 1 bedside commode    Recommendations for Other Services      Precautions / Restrictions Precautions Precautions: Fall Restrictions Weight Bearing Restrictions: No       Mobility Bed Mobility Overal bed mobility: Modified Independent             General bed mobility comments: no physical assist needed  Transfers Overall transfer level: Needs assistance Equipment used: None Transfers: Sit to/from Stand Sit to Stand:  Supervision         General transfer comment: for safety and immediate standing balance    Balance Overall balance assessment: Needs assistance Sitting-balance support: No upper extremity supported;Feet supported Sitting balance-Leahy Scale: Normal       Standing balance-Leahy Scale: Good Standing balance comment: appears intact today. Pt continues to move slowly but no LOB noted.                           ADL either performed or assessed with clinical judgement   ADL Overall ADL's : Needs assistance/impaired Eating/Feeding: Independent;Sitting   Grooming: Supervision/safety;Standing               Lower Body Dressing: Set up;Supervision/safety;Sit to/from Archivist: Ambulation;Regular Toilet;Supervision/safety   Toileting- Water quality scientist and Hygiene: Modified independent   Tub/ Banker: Supervision/safety;Tub bench;Cueing for sequencing Tub/Shower Transfer Details (indicate cue type and reason): simulated transfer Functional mobility during ADLs: Supervision/safety General ADL Comments: Feel pt is improving with mobility during adls and close to baseline.  Pt should be modified independent in next day or so.  Pt's noted deficits lie in areas of making simple change and money management.  Feel pt has some high level executive skills that are impaired.       Vision       Perception     Praxis      Cognition Arousal/Alertness: Awake/alert Behavior During Therapy: WFL for tasks assessed/performed;Anxious Overall Cognitive Status: Impaired/Different from baseline Area of Impairment:  Attention;Problem solving                   Current Attention Level: Selective Memory: Decreased short-term memory       Problem Solving: Slow processing General Comments: Pt attempted several simple money transactions and was unable to complete them in her head.  When pt wrote the math problem on paper, pt continue to struggle with  subtraction. Pt could figure out the process but counted on her fingers to complete 9-6.  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  Feel math/numbers work is impaired at this time.  Spoke with family about making sure they are checking over any work she does with money or the checkbook after d/c.         Exercises     Shoulder Instructions       General Comments Pt with no LOB during session. Moves slowly.  Less SOB    Pertinent Vitals/ Pain       Pain Assessment: No/denies pain  Home Living                                          Prior Functioning/Environment              Frequency  Min 3X/week        Progress Toward Goals  OT Goals(current goals can now be found in the care plan section)  Progress towards OT goals: Progressing toward goals  Acute Rehab OT Goals Patient Stated Goal: return home OT Goal Formulation: With patient Time For Goal Achievement: 03/24/18 Potential to Achieve Goals: Good ADL Goals Pt Will Perform Lower Body Bathing: with modified independence;sit to/from stand Pt Will Perform Lower Body Dressing: with modified independence;sit to/from stand Pt Will Transfer to Toilet: with modified independence;ambulating Pt Will Perform Toileting - Clothing Manipulation and hygiene: with modified independence;sit to/from stand Pt Will Perform Tub/Shower Transfer: with modified independence;Tub transfer;tub bench;ambulating Additional ADL Goal #1: Pt wil independently verbalize 3 strategies to prevent falls  Plan Discharge plan remains appropriate    Co-evaluation                 AM-PAC OT "6 Clicks" Daily Activity     Outcome Measure   Help from another person eating meals?: None Help from another person taking care of personal grooming?: None Help from another person toileting, which includes using toliet, bedpan, or urinal?: None Help from another person bathing  (including washing, rinsing, drying)?: A Little Help from another person to put on and taking off regular upper body clothing?: None Help from another person to put on and taking off regular lower body clothing?: None 6 Click Score: 23    End of Session    OT Visit Diagnosis: Unsteadiness on feet (R26.81);Other symptoms and signs involving cognitive function   Activity Tolerance     Patient Left in bed;with call bell/phone within reach;with family/visitor present   Nurse Communication Mobility status        Time: 1287-8676 OT Time Calculation (min): 34 min  Charges: OT General Charges $OT Visit: 1 Visit OT Treatments $Self Care/Home Management : 23-37 mins  Jinger Neighbors, OTR/L [phone]   Glenford Peers 03/11/2018, 11:04 AM

## 2018-03-11 NOTE — Progress Notes (Signed)
EEG completed, results pending. 

## 2018-03-11 NOTE — Progress Notes (Signed)
PT Cancellation Note  Patient Details Name: Erin Dean MRN: [id number] DOB: 07-01-1965   Cancelled Treatment:    Reason Eval/Treat Not Completed: Patient at procedure or test/unavailable At MRI. Will follow-up as time allows  Lanney Gins, PT, DPT Supplemental Physical Therapist 03/11/18 12:30 PM Pager: [phone] Office: [phone]

## 2018-03-12 NOTE — Care Management (Signed)
03/12/2018 at 0900: CM was able to get patient an appointment at [facility]. Information called to patient.

## 2018-03-17 ENCOUNTER — Encounter (HOSPITAL_COMMUNITY): Payer: Self-pay

## 2018-03-24 ENCOUNTER — Encounter (HOSPITAL_COMMUNITY): Payer: Self-pay

## 2018-03-25 ENCOUNTER — Other Ambulatory Visit: Payer: Self-pay | Admitting: *Deleted

## 2018-03-25 ENCOUNTER — Ambulatory Visit (INDEPENDENT_AMBULATORY_CARE_PROVIDER_SITE_OTHER): Payer: Self-pay | Admitting: Vascular Surgery

## 2018-03-25 ENCOUNTER — Ambulatory Visit (HOSPITAL_COMMUNITY)
Admission: RE | Admit: 2018-03-25 | Discharge: 2018-03-25 | Disposition: A | Discharge status: Home / Self Care | Payer: Medicaid Other | Source: Ambulatory Visit | Attending: Family | Admitting: Family

## 2018-03-25 ENCOUNTER — Encounter: Payer: Self-pay | Admitting: *Deleted

## 2018-03-25 ENCOUNTER — Encounter: Payer: Self-pay | Admitting: Vascular Surgery

## 2018-03-25 ENCOUNTER — Other Ambulatory Visit: Payer: Self-pay

## 2018-03-25 ENCOUNTER — Ambulatory Visit: Payer: Self-pay | Admitting: Family Medicine

## 2018-03-25 VITALS — BP 144/81 | HR 61 | Temp 98.4°F | Resp 20 | Ht 67.0 in | Wt 238.0 lb

## 2018-03-25 DIAGNOSIS — N185 Chronic kidney disease, stage 5: Secondary | ICD-10-CM

## 2018-03-25 NOTE — Progress Notes (Signed)
Patient name: Erin Dean MRN: [id number] DOB: Oct 16, 1965 Sex: female  REASON FOR VISIT: Postop check after right 1st stage brachiobasilic fistula  HPI: Erin Dean is a 52 y.o. female that presents for postop check after right first stage brachiobasilic fistula on 08/12/9792.  Patient previously had undergone a right brachiocephalic fistula with superficialalization but that thrombosed.  She has done well since her right first stage brachiobasilic.  She denies any numbness weakness or tingling in her right hand.  No symptoms suggestive of steal.  Her fistula still has a good thrill.  She had a fistula duplex today.  Past Medical History:  Diagnosis Date  . Chronic kidney disease    see Dr Hollie Salk at [facility]  . Hypertension   . Stroke ([place]) 04/2017   no residual   . SVT (supraventricular tachycardia) ([place])     Past Surgical History:  Procedure Laterality Date  . AV FISTULA PLACEMENT Right 08/16/2017   Procedure: CREATION OF RADIOCEPHALIC VERSUS BRACHIOCEPHALIC ARTERIOVENOUS FISTULA RIGHT ARM;  Surgeon: Conrad [place]on [place], MD;  Location: [place];  Service: Vascular;  Laterality: Right;  . AV FISTULA PLACEMENT Right 02/07/2018   Procedure: Creation Right arm Brachiocephalic Fistula;  Surgeon: Marty Heck, MD;  Location: [place];  Service: Vascular;  Laterality: Right;  . CESAREAN SECTION    . ESOPHAGOGASTRODUODENOSCOPY N/A 04/19/2017   Procedure: ESOPHAGOGASTRODUODENOSCOPY (EGD);  Surgeon: Carol Ada, MD;  Location: [place];  Service: Endoscopy;  Laterality: N/A;  . FISTULA SUPERFICIALIZATION Right 10/01/2017   Procedure: FISTULA SUPERFICIALIZATION RIGHT ARM;  Surgeon: Conrad [place]port, MD;  Location: [place];  Service: Vascular;  Laterality: Right;  . LOOP RECORDER INSERTION N/A 04/22/2017   Procedure: LOOP RECORDER INSERTION;  Surgeon: Constance Haw, MD;  Location: [place] CV [facility];  Service: Cardiovascular;  Laterality: N/A;  . TEE WITHOUT CARDIOVERSION N/A  04/19/2017   Procedure: TRANSESOPHAGEAL ECHOCARDIOGRAM (TEE);  Surgeon: Carol Ada, MD;  Location: [place];  Service: Endoscopy;  Laterality: N/A;    Family History  Problem Relation Age of Onset  . Heart attack Maternal Uncle   . Heart attack Maternal Grandfather   . Stroke Paternal Grandmother   . Cancer Father   . Hypertension Sister     SOCIAL HISTORY: Social History   Tobacco Use  . Smoking status: Never Smoker  . Smokeless tobacco: Never Used  Substance Use Topics  . Alcohol use: No    No Known Allergies  Current Outpatient Medications  Medication Sig Dispense Refill  . acetaminophen (TYLENOL) 500 MG tablet Take 2 tablets (1,000 mg total) by mouth daily as needed for moderate pain or headache. 30 tablet 0  . amLODipine (NORVASC) 10 MG tablet Take 1 tablet (10 mg total) by mouth daily. 180 tablet 3  . aspirin 81 MG EC tablet Take 1 tablet (81 mg total) by mouth daily for 21 days. Stop taking after 3 weeks - continue plavix alone 21 tablet 0  . atorvastatin (LIPITOR) 40 MG tablet Take 1 tablet (40 mg total) by mouth daily at 6 PM. 30 tablet 1  . calcitRIOL (ROCALTROL) 0.25 MCG capsule Take 1 capsule (0.25 mcg total) by mouth daily. 30 capsule 2  . carvedilol (COREG) 25 MG tablet Take 1 tablet (25 mg total) by mouth 2 (two) times daily with a meal. 60 tablet 2  . cloNIDine (CATAPRES) 0.1 MG tablet Take 1 tablet (0.1 mg total) by mouth 3 (three) times daily. 60 tablet 11  . clopidogrel (PLAVIX) 75  MG tablet Take 1 tablet (75 mg total) by mouth daily. 30 tablet 2  . cyanocobalamin 1000 MCG tablet Take 1 tablet (1,000 mcg total) by mouth daily. 30 tablet 2  . escitalopram (LEXAPRO) 10 MG tablet Take 1 tablet (10 mg total) by mouth daily. 30 tablet 2  . ferrous sulfate 325 (65 FE) MG tablet Take 1 tablet (325 mg total) by mouth 3 (three) times daily with meals. 90 tablet 2  . furosemide (LASIX) 40 MG tablet Take 1 tablet (40 mg total) by mouth See admin instructions. Take  40 mg tablet with 80 mg to equal 120 twice daily then take a third 40 mg dose by itself 60 tablet 2  . furosemide (LASIX) 80 MG tablet Take 1 tablet (80 mg total) by mouth 2 (two) times daily. Take with 40 mg to equal 120 mg twice daily 60 tablet 2  . hydrALAZINE (APRESOLINE) 100 MG tablet Take 1 tablet (100 mg total) by mouth 3 (three) times daily. 30 tablet 2  . HYDROcodone-acetaminophen (NORCO) 5-325 MG tablet Take 1 tablet by mouth every 6 (six) hours as needed for up to 12 doses for moderate pain. 12 tablet 0  . pantoprazole (PROTONIX) 40 MG tablet Take 1 tablet (40 mg total) by mouth daily. 30 tablet 2   No current facility-administered medications for this visit.     REVIEW OF SYSTEMS:  [X]  denotes positive finding, [ ]  denotes negative finding Cardiac  Comments:  Chest pain or chest pressure:    Shortness of breath upon exertion:    Short of breath when lying flat:    Irregular heart rhythm:        Vascular    Pain in calf, thigh, or hip brought on by ambulation:    Pain in feet at night that wakes you up from your sleep:     Blood clot in your veins:    Leg swelling:         Pulmonary    Oxygen at home:    Productive cough:     Wheezing:         Neurologic    Sudden weakness in arms or legs:     Sudden numbness in arms or legs:     Sudden onset of difficulty speaking or slurred speech:    Temporary loss of vision in one eye:     Problems with dizziness:         Gastrointestinal    Blood in stool:     Vomited blood:         Genitourinary    Burning when urinating:     Blood in urine:        Psychiatric    Major depression:         Hematologic    Bleeding problems:    Problems with blood clotting too easily:        Skin    Rashes or ulcers:        Constitutional    Fever or chills:      PHYSICAL EXAM: Vitals:   03/25/18 1140  BP: (!) 144/81  Pulse: 61  Resp: 20  Temp: 98.4 F (36.9 C)  SpO2: 99%  Weight: 238 lb (108 kg)  Height: 5\' 7"  (1.702  m)    GENERAL: The patient is a well-nourished female, in no acute distress. The vital signs are documented above. CARDIAC: There is a regular rate and rhythm.  VASCULAR: Good thrill in right brachiobasilc fistula. Incision  well healed.  Radial pulse palpable at wrist with no tissue loss.  DATA:   Fistula duplex suggest good flow volumes with a nicely dilated vein that is between 8 and 6 mm  Assessment/Plan:  Erin Dean is doing well after her right first stage brachiobasilic fistula on 69/1/67.  She has a good thrill in the fistula today with no symptoms suggestive of steal and has nicely dilated vein on duplex.  We will schedule her for right upper extremity second stage brachiobasilic fistula at next available appointment.  She is not on dialysis at this time.   Marty Heck, MD [facility] Office: [phone] Pager: [phone]

## 2018-03-26 ENCOUNTER — Other Ambulatory Visit: Payer: Self-pay

## 2018-03-26 NOTE — Patient Outreach (Signed)
[place] [facility]) Care Management  03/26/2018  Erin Dean 1965-05-03 [id number]  EMMI: stroke red alert Referral date: 03/26/18 Referral reason: went to follow up appointment: no Insurance" [employer] cross blue shield Day # 13  Telephone call to patient regarding EMMI stroke red alert. Unable to reach patient. HIPAA  Compliant voice message left with call back phone number.   PLAN; RNCM will attempt 2nd telephone call to patient withn 4 business days.   Quinn Plowman RN,BSN,CCM [facility] Telephonic  [phone]

## 2018-03-28 ENCOUNTER — Other Ambulatory Visit: Payer: Self-pay

## 2018-03-28 NOTE — Patient Outreach (Signed)
[place] [facility]) Care Management  03/28/2018  Erin Dean [birth year] [id number]  EMMI: stroke red alert Referral date: 03/26/18 Referral reason: went to follow up appointment: no Insurance" [employer] cross blue shield Day # 13  Telephone call to patient regarding EMMI stroke red alert. HIPAA verified with patient. Explained reason for call. Patient states she has seen the nephrologist since discharge because she will be having an upcoming surgery for her fistula.  She states she has an appointment with her primary MD within 1 week and has a follow up appointment with her kidney doctor in January 2020.   Patient states she is taking her medications as prescribed and has transportation to her appointments.  Patient states she has not started her physical therapy yet.  Patient states she was called by the outpatient therapy but could not schedule then due to her having several appointments that week. Patient states she has not heard back from outpatient therapy and does not have a contact phone number for them.  RNCM gave patient contact phone number for [facility] health outpatient rehab. Advised patient to call and set up appointment for therapy.  Patient verbalized understanding. Patient denies any new onset of symptoms.  RNCM reviewed signs of stroke with patient. Advised patient that 911 should be called for stroke like symptoms.  RNCM advised patient to notify MD of any changes in condition prior to scheduled appointment.  PLAN:  RNCM will follow up  With patient within 1 week to confirm that outpatient therapy has been arranged.   Quinn Plowman RN,BSN,CCM [facility] Telephonic  [phone]

## 2018-04-01 ENCOUNTER — Encounter (HOSPITAL_COMMUNITY): Payer: PRIVATE HEALTH INSURANCE

## 2018-04-01 ENCOUNTER — Other Ambulatory Visit: Payer: Self-pay

## 2018-04-01 NOTE — Patient Outreach (Signed)
[place] [facility]) Care Management  04/01/2018  Erin Dean Sep 21, 1965 [id number]  EMMI:stroke red alert Referral date:03/26/18 Referral reason:went to follow up appointment: no Insurance" [employer] cross blue shield Day #13  Telephone call to patient regarding EMMI stroke follow up.  HIPAA verified with patient. Explained reason for call.  Patient states she has not set up her outpatient therapy yet because she has not had time.  RNCM attempted to proceed with conversation with patient but call dropped.  Attempted call back x 2.  No answer from patient.  Unable to leave voice message due to message stating voice mail box has not been set up yet.   PLAN: RNCM will attempt outreach to patient within 4 business days.   Quinn Plowman RN,BSN,CCM [facility] Telephonic  [phone]

## 2018-04-03 ENCOUNTER — Encounter (HOSPITAL_COMMUNITY): Payer: Self-pay | Admitting: *Deleted

## 2018-04-03 ENCOUNTER — Other Ambulatory Visit: Payer: Self-pay

## 2018-04-03 NOTE — Progress Notes (Addendum)
Erin Erin Dean denies cheat pain or shortness of breath.  Patient reports that she becomes short of breath when she doesn't take fluid pill, but she is taking it now and doing well. Patient denies any residual form stroke in January of this year and states that in December that her speech was back to normal by that evening.   Patient has a follow up appointment with neurologist in February. Erin Dean's PCP is with [facility] and nephrologist is Dr Hollie Salk with [facility]. Last dose of Plavix was 04/02/18, I reminded patient to hold Plavix and continue Aspirin.

## 2018-04-07 ENCOUNTER — Ambulatory Visit (HOSPITAL_COMMUNITY)
Admission: RE | Admit: 2018-04-07 | Discharge: 2018-04-07 | Disposition: A | Discharge status: Home / Self Care | Payer: Medicaid Other | Attending: Vascular Surgery | Admitting: Vascular Surgery

## 2018-04-07 ENCOUNTER — Other Ambulatory Visit: Payer: Self-pay

## 2018-04-07 ENCOUNTER — Ambulatory Visit (HOSPITAL_COMMUNITY): Payer: Medicaid Other | Admitting: Physician Assistant

## 2018-04-07 ENCOUNTER — Encounter (HOSPITAL_COMMUNITY)
Admission: RE | Disposition: A | Discharge status: Home / Self Care | Payer: Self-pay | Source: Home / Self Care | Attending: Vascular Surgery

## 2018-04-07 ENCOUNTER — Encounter (HOSPITAL_COMMUNITY): Payer: Self-pay | Admitting: *Deleted

## 2018-04-07 DIAGNOSIS — Z8673 Personal history of transient ischemic attack (TIA), and cerebral infarction without residual deficits: Secondary | ICD-10-CM | POA: Diagnosis not present

## 2018-04-07 DIAGNOSIS — N189 Chronic kidney disease, unspecified: Secondary | ICD-10-CM | POA: Diagnosis not present

## 2018-04-07 DIAGNOSIS — Z7902 Long term (current) use of antithrombotics/antiplatelets: Secondary | ICD-10-CM | POA: Insufficient documentation

## 2018-04-07 DIAGNOSIS — E785 Hyperlipidemia, unspecified: Secondary | ICD-10-CM | POA: Diagnosis not present

## 2018-04-07 DIAGNOSIS — I129 Hypertensive chronic kidney disease with stage 1 through stage 4 chronic kidney disease, or unspecified chronic kidney disease: Secondary | ICD-10-CM | POA: Diagnosis present

## 2018-04-07 DIAGNOSIS — N185 Chronic kidney disease, stage 5: Secondary | ICD-10-CM

## 2018-04-07 DIAGNOSIS — Z7982 Long term (current) use of aspirin: Secondary | ICD-10-CM | POA: Insufficient documentation

## 2018-04-07 DIAGNOSIS — Z79899 Other long term (current) drug therapy: Secondary | ICD-10-CM | POA: Insufficient documentation

## 2018-04-07 DIAGNOSIS — D631 Anemia in chronic kidney disease: Secondary | ICD-10-CM | POA: Diagnosis not present

## 2018-04-07 HISTORY — PX: BASCILIC VEIN TRANSPOSITION: SHX5742

## 2018-04-07 LAB — POCT I-STAT 4, (NA,K, GLUC, HGB,HCT)
Glucose, Bld: 104 mg/dL — ABNORMAL HIGH (ref 70–99)
HCT: 29 % — ABNORMAL LOW (ref 36.0–46.0)
Hemoglobin: 9.9 g/dL — ABNORMAL LOW (ref 12.0–15.0)
Potassium: 3.8 mmol/L (ref 3.5–5.1)
SODIUM: 144 mmol/L (ref 135–145)

## 2018-04-07 SURGERY — TRANSPOSITION, VEIN, BASILIC
Anesthesia: Monitor Anesthesia Care | Laterality: Right

## 2018-04-07 MED ORDER — CEFAZOLIN SODIUM-DEXTROSE 2-3 GM-%(50ML) IV SOLR
INTRAVENOUS | Status: DC | PRN
  Administered 2018-04-07: 2 g via INTRAVENOUS

## 2018-04-07 MED ORDER — OXYCODONE HCL 5 MG/5ML PO SOLN: 5.0000 mg | Freq: Once | ORAL | Status: DC | PRN

## 2018-04-07 MED ORDER — OXYCODONE HCL 5 MG PO TABS: 5.0000 mg | ORAL_TABLET | Freq: Once | ORAL | Status: DC | PRN

## 2018-04-07 MED ORDER — FENTANYL CITRATE (PF) 250 MCG/5ML IJ SOLN
INTRAMUSCULAR | Status: DC | PRN
  Administered 2018-04-07 (×4): 25 ug via INTRAVENOUS

## 2018-04-07 MED ORDER — FENTANYL CITRATE (PF) 250 MCG/5ML IJ SOLN
INTRAMUSCULAR | Status: AC
  Filled 2018-04-07: qty 5

## 2018-04-07 MED ORDER — DEXAMETHASONE SODIUM PHOSPHATE 10 MG/ML IJ SOLN
INTRAMUSCULAR | Status: AC
  Filled 2018-04-07: qty 1

## 2018-04-07 MED ORDER — PROMETHAZINE HCL 25 MG/ML IJ SOLN
INTRAMUSCULAR | Status: AC
  Filled 2018-04-07: qty 1

## 2018-04-07 MED ORDER — PROPOFOL 10 MG/ML IV BOLUS
INTRAVENOUS | Status: DC | PRN
  Administered 2018-04-07 (×2): 100 mg via INTRAVENOUS

## 2018-04-07 MED ORDER — HYDROCODONE-ACETAMINOPHEN 5-325 MG PO TABS: 1.0000 | ORAL_TABLET | Freq: Four times a day (QID) | ORAL | 0 refills | Status: DC | PRN

## 2018-04-07 MED ORDER — LIDOCAINE-EPINEPHRINE (PF) 1 %-1:200000 IJ SOLN
INTRAMUSCULAR | Status: AC
  Filled 2018-04-07: qty 30

## 2018-04-07 MED ORDER — ONDANSETRON HCL 4 MG/2ML IJ SOLN
INTRAMUSCULAR | Status: AC
  Filled 2018-04-07: qty 2

## 2018-04-07 MED ORDER — LIDOCAINE 2% (20 MG/ML) 5 ML SYRINGE
INTRAMUSCULAR | Status: AC
  Filled 2018-04-07: qty 5

## 2018-04-07 MED ORDER — PROMETHAZINE HCL 25 MG/ML IJ SOLN
6.2500 mg | INTRAMUSCULAR | Status: DC | PRN
  Administered 2018-04-07: 6.25 mg via INTRAVENOUS

## 2018-04-07 MED ORDER — SODIUM CHLORIDE 0.9 % IV SOLN
INTRAVENOUS | Status: AC
  Filled 2018-04-07: qty 1.2

## 2018-04-07 MED ORDER — PROTAMINE SULFATE 10 MG/ML IV SOLN
INTRAVENOUS | Status: AC
  Filled 2018-04-07: qty 5

## 2018-04-07 MED ORDER — FUROSEMIDE 10 MG/ML IJ SOLN
20.0000 mg | Freq: Once | INTRAMUSCULAR | Status: AC
  Administered 2018-04-07: 20 mg via INTRAVENOUS

## 2018-04-07 MED ORDER — HYDROMORPHONE HCL 1 MG/ML IJ SOLN
INTRAMUSCULAR | Status: AC
  Filled 2018-04-07: qty 1

## 2018-04-07 MED ORDER — FUROSEMIDE 10 MG/ML IJ SOLN
INTRAMUSCULAR | Status: AC
  Filled 2018-04-07: qty 4

## 2018-04-07 MED ORDER — LIDOCAINE HCL (CARDIAC) PF 100 MG/5ML IV SOSY
PREFILLED_SYRINGE | INTRAVENOUS | Status: DC | PRN
  Administered 2018-04-07: 60 mg via INTRATRACHEAL

## 2018-04-07 MED ORDER — SODIUM CHLORIDE 0.9 % IV SOLN
INTRAVENOUS | Status: DC | PRN
  Administered 2018-04-07: 500 mL

## 2018-04-07 MED ORDER — PROPOFOL 10 MG/ML IV BOLUS
INTRAVENOUS | Status: AC
  Filled 2018-04-07: qty 40

## 2018-04-07 MED ORDER — SODIUM CHLORIDE 0.9 % IV SOLN
INTRAVENOUS | Status: DC
  Administered 2018-04-07 (×2): via INTRAVENOUS

## 2018-04-07 MED ORDER — HYDROMORPHONE HCL 1 MG/ML IJ SOLN
0.2500 mg | INTRAMUSCULAR | Status: DC | PRN
  Administered 2018-04-07: 0.5 mg via INTRAVENOUS

## 2018-04-07 MED ORDER — HEPARIN SODIUM (PORCINE) 1000 UNIT/ML IJ SOLN
INTRAMUSCULAR | Status: DC | PRN
  Administered 2018-04-07: 3000 [IU] via INTRAVENOUS

## 2018-04-07 MED ORDER — CHLORHEXIDINE GLUCONATE 4 % EX LIQD: 1.0000 "application " | Freq: Once | CUTANEOUS | Status: DC

## 2018-04-07 MED ORDER — DEXAMETHASONE SODIUM PHOSPHATE 10 MG/ML IJ SOLN
INTRAMUSCULAR | Status: DC | PRN
  Administered 2018-04-07: 10 mg via INTRAVENOUS

## 2018-04-07 MED ORDER — ONDANSETRON HCL 4 MG/2ML IJ SOLN
INTRAMUSCULAR | Status: DC | PRN
  Administered 2018-04-07: 4 mg via INTRAVENOUS

## 2018-04-07 MED ORDER — CEFAZOLIN SODIUM 1 G IJ SOLR
INTRAMUSCULAR | Status: AC
  Filled 2018-04-07: qty 20

## 2018-04-07 MED ORDER — 0.9 % SODIUM CHLORIDE (POUR BTL) OPTIME
TOPICAL | Status: DC | PRN
  Administered 2018-04-07: 1000 mL

## 2018-04-07 SURGICAL SUPPLY — 40 items
ARMBAND PINK RESTRICT EXTREMIT (MISCELLANEOUS) ×2 IMPLANT
CANISTER SUCT 3000ML PPV (MISCELLANEOUS) ×2 IMPLANT
CLIP VESOCCLUDE MED 24/CT (CLIP) IMPLANT
CLIP VESOCCLUDE MED 6/CT (CLIP) ×2 IMPLANT
CLIP VESOCCLUDE SM WIDE 24/CT (CLIP) IMPLANT
CLIP VESOCCLUDE SM WIDE 6/CT (CLIP) ×2 IMPLANT
COVER PROBE W GEL 5X96 (DRAPES) ×2 IMPLANT
COVER WAND RF STERILE (DRAPES) ×2 IMPLANT
DECANTER SPIKE VIAL GLASS SM (MISCELLANEOUS) IMPLANT
DERMABOND ADVANCED (GAUZE/BANDAGES/DRESSINGS) ×3
DERMABOND ADVANCED .7 DNX12 (GAUZE/BANDAGES/DRESSINGS) ×3 IMPLANT
ELECT REM PT RETURN 9FT ADLT (ELECTROSURGICAL) ×2
ELECTRODE REM PT RTRN 9FT ADLT (ELECTROSURGICAL) ×1 IMPLANT
GLOVE BIO SURGEON STRL SZ7.5 (GLOVE) ×4 IMPLANT
GLOVE BIOGEL PI IND STRL 8 (GLOVE) ×1 IMPLANT
GLOVE BIOGEL PI INDICATOR 8 (GLOVE) ×1
GOWN STRL REUS W/ TWL LRG LVL3 (GOWN DISPOSABLE) ×3 IMPLANT
GOWN STRL REUS W/ TWL XL LVL3 (GOWN DISPOSABLE) ×2 IMPLANT
GOWN STRL REUS W/TWL LRG LVL3 (GOWN DISPOSABLE) ×3
GOWN STRL REUS W/TWL XL LVL3 (GOWN DISPOSABLE) ×2
HEMOSTAT SPONGE AVITENE ULTRA (HEMOSTASIS) IMPLANT
KIT BASIN OR (CUSTOM PROCEDURE TRAY) ×2 IMPLANT
KIT TURNOVER KIT B (KITS) ×2 IMPLANT
NEEDLE HYPO 25GX1X1/2 BEV (NEEDLE) IMPLANT
NS IRRIG 1000ML POUR BTL (IV SOLUTION) ×2 IMPLANT
PACK CV ACCESS (CUSTOM PROCEDURE TRAY) ×2 IMPLANT
PAD ARMBOARD 7.5X6 YLW CONV (MISCELLANEOUS) ×4 IMPLANT
SUT MNCRL AB 4-0 PS2 18 (SUTURE) ×6 IMPLANT
SUT PROLENE 6 0 BV (SUTURE) ×2 IMPLANT
SUT PROLENE 7 0 BV 1 (SUTURE) IMPLANT
SUT SILK 2 0 SH (SUTURE) ×4 IMPLANT
SUT SILK 3 0 (SUTURE) ×1
SUT SILK 3-0 18XBRD TIE 12 (SUTURE) ×1 IMPLANT
SUT VIC AB 2-0 CT1 27 (SUTURE) ×3
SUT VIC AB 2-0 CT1 TAPERPNT 27 (SUTURE) ×3 IMPLANT
SUT VIC AB 3-0 SH 27 (SUTURE) ×3
SUT VIC AB 3-0 SH 27X BRD (SUTURE) ×3 IMPLANT
TOWEL GREEN STERILE (TOWEL DISPOSABLE) ×2 IMPLANT
UNDERPAD 30X30 (UNDERPADS AND DIAPERS) ×2 IMPLANT
WATER STERILE IRR 1000ML POUR (IV SOLUTION) ×2 IMPLANT

## 2018-04-07 NOTE — Transfer of Care (Signed)
Immediate Anesthesia Transfer of Care Note  Patient: [name]  Procedure(s) Performed: SECOND STAGE BASILIC VEIN TRANSPOSITION RIGHT ARM (Right )  Patient Location: PACU  Anesthesia Type:General  Level of Consciousness: awake, alert , oriented and patient cooperative  Airway & Oxygen Therapy: Patient Spontanous Breathing and Patient connected to face mask oxygen  Post-op Assessment: Report given to RN, Post -op Vital signs reviewed and stable and Patient moving all extremities  Post vital signs: Reviewed and stable  Last Vitals:  Vitals Value Taken Time  BP    Temp    Pulse    Resp 14 04/07/2018  3:04 PM  SpO2    Vitals shown include unvalidated device data.  Last Pain:  Vitals:   04/07/18 0903  TempSrc:   PainSc: 0-No pain         Complications: No apparent anesthesia complications

## 2018-04-07 NOTE — Progress Notes (Signed)
Spoke with Dr. Sabra Heck about patients respiration rate of 30-32 with multiple rechecks.  According to the patient she feels that her breathing is normal today and that there is not anything different or new.  Patient has a fast shallow breathing pattern.  At this time no further orders.

## 2018-04-07 NOTE — H&P (Signed)
History and Physical Interval Note:  04/07/2018 11:54 AM  Erin Dean  has presented today for surgery, with the diagnosis of CHRONIC KIDNEY DISEASE FOR HEMODIALYSIS ACCESS  The various methods of treatment have been discussed with the patient and family. After consideration of risks, benefits and other options for treatment, the patient has consented to  Procedure(s): SECOND STAGE BASILIC VEIN TRANSPOSITION RIGHT ARM (Right) as a surgical intervention .  The patient's history has been reviewed, patient examined, no change in status, stable for surgery.  I have reviewed the patient's chart and labs.  Questions were answered to the patient's satisfaction.     Right 2nd stage brachiobasilic AVF  Marty Heck  Patient name: Erin Dean      MRN: [id number]        DOB: 1965-11-22          Sex: female  REASON FOR VISIT: Postop check after right 1st stage brachiobasilic fistula  HPI: Erin Dean is a 52 y.o. female that presents for postop check after right first stage brachiobasilic fistula on 96/05/2295.  Patient previously had undergone a right brachiocephalic fistula with superficialalization but that thrombosed.  She has done well since her right first stage brachiobasilic.  She denies any numbness weakness or tingling in her right hand.  No symptoms suggestive of steal.  Her fistula still has a good thrill.  She had a fistula duplex today.      Past Medical History:  Diagnosis Date  . Chronic kidney disease    see Dr Hollie Salk at [facility]  . Hypertension   . Stroke ([place]) 04/2017   no residual   . SVT (supraventricular tachycardia) ([place])          Past Surgical History:  Procedure Laterality Date  . AV FISTULA PLACEMENT Right 08/16/2017   Procedure: CREATION OF RADIOCEPHALIC VERSUS BRACHIOCEPHALIC ARTERIOVENOUS FISTULA RIGHT ARM;  Surgeon: Conrad Nathalie, MD;  Location: [place];  Service: Vascular;  Laterality: Right;  . AV FISTULA PLACEMENT Right  02/07/2018   Procedure: Creation Right arm Brachiocephalic Fistula;  Surgeon: Marty Heck, MD;  Location: [place];  Service: Vascular;  Laterality: Right;  . CESAREAN SECTION    . ESOPHAGOGASTRODUODENOSCOPY N/A 04/19/2017   Procedure: ESOPHAGOGASTRODUODENOSCOPY (EGD);  Surgeon: Carol Ada, MD;  Location: [place];  Service: Endoscopy;  Laterality: N/A;  . FISTULA SUPERFICIALIZATION Right 10/01/2017   Procedure: FISTULA SUPERFICIALIZATION RIGHT ARM;  Surgeon: Conrad [place], MD;  Location: [place];  Service: Vascular;  Laterality: Right;  . LOOP RECORDER INSERTION N/A 04/22/2017   Procedure: LOOP RECORDER INSERTION;  Surgeon: Constance Haw, MD;  Location: [place] [facility];  Service: Cardiovascular;  Laterality: N/A;  . TEE WITHOUT CARDIOVERSION N/A 04/19/2017   Procedure: TRANSESOPHAGEAL ECHOCARDIOGRAM (TEE);  Surgeon: Carol Ada, MD;  Location: [place];  Service: Endoscopy;  Laterality: N/A;         Family History  Problem Relation Age of Onset  . Heart attack Maternal Uncle   . Heart attack Maternal Grandfather   . Stroke Paternal Grandmother   . Cancer Father   . Hypertension Sister     SOCIAL HISTORY: Social History       Tobacco Use  . Smoking status: Never Smoker  . Smokeless tobacco: Never Used  Substance Use Topics  . Alcohol use: No    No Known Allergies        Current Outpatient Medications  Medication Sig Dispense Refill  . acetaminophen (TYLENOL) 500  MG tablet Take 2 tablets (1,000 mg total) by mouth daily as needed for moderate pain or headache. 30 tablet 0  . amLODipine (NORVASC) 10 MG tablet Take 1 tablet (10 mg total) by mouth daily. 180 tablet 3  . aspirin 81 MG EC tablet Take 1 tablet (81 mg total) by mouth daily for 21 days. Stop taking after 3 weeks - continue plavix alone 21 tablet 0  . atorvastatin (LIPITOR) 40 MG tablet Take 1 tablet (40 mg total) by mouth daily at 6 PM. 30 tablet 1  . calcitRIOL (ROCALTROL)  0.25 MCG capsule Take 1 capsule (0.25 mcg total) by mouth daily. 30 capsule 2  . carvedilol (COREG) 25 MG tablet Take 1 tablet (25 mg total) by mouth 2 (two) times daily with a meal. 60 tablet 2  . cloNIDine (CATAPRES) 0.1 MG tablet Take 1 tablet (0.1 mg total) by mouth 3 (three) times daily. 60 tablet 11  . clopidogrel (PLAVIX) 75 MG tablet Take 1 tablet (75 mg total) by mouth daily. 30 tablet 2  . cyanocobalamin 1000 MCG tablet Take 1 tablet (1,000 mcg total) by mouth daily. 30 tablet 2  . escitalopram (LEXAPRO) 10 MG tablet Take 1 tablet (10 mg total) by mouth daily. 30 tablet 2  . ferrous sulfate 325 (65 FE) MG tablet Take 1 tablet (325 mg total) by mouth 3 (three) times daily with meals. 90 tablet 2  . furosemide (LASIX) 40 MG tablet Take 1 tablet (40 mg total) by mouth See admin instructions. Take 40 mg tablet with 80 mg to equal 120 twice daily then take a third 40 mg dose by itself 60 tablet 2  . furosemide (LASIX) 80 MG tablet Take 1 tablet (80 mg total) by mouth 2 (two) times daily. Take with 40 mg to equal 120 mg twice daily 60 tablet 2  . hydrALAZINE (APRESOLINE) 100 MG tablet Take 1 tablet (100 mg total) by mouth 3 (three) times daily. 30 tablet 2  . HYDROcodone-acetaminophen (NORCO) 5-325 MG tablet Take 1 tablet by mouth every 6 (six) hours as needed for up to 12 doses for moderate pain. 12 tablet 0  . pantoprazole (PROTONIX) 40 MG tablet Take 1 tablet (40 mg total) by mouth daily. 30 tablet 2   No current facility-administered medications for this visit.     REVIEW OF SYSTEMS:  [X]  denotes positive finding, [ ]  denotes negative finding Cardiac  Comments:  Chest pain or chest pressure:    Shortness of breath upon exertion:    Short of breath when lying flat:    Irregular heart rhythm:        Vascular    Pain in calf, thigh, or hip brought on by ambulation:    Pain in feet at night that wakes you up from your sleep:     Blood clot in your veins:    Leg  swelling:         Pulmonary    Oxygen at home:    Productive cough:     Wheezing:         Neurologic    Sudden weakness in arms or legs:     Sudden numbness in arms or legs:     Sudden onset of difficulty speaking or slurred speech:    Temporary loss of vision in one eye:     Problems with dizziness:         Gastrointestinal    Blood in stool:     Vomited blood:  Genitourinary    Burning when urinating:     Blood in urine:        Psychiatric    Major depression:         Hematologic    Bleeding problems:    Problems with blood clotting too easily:        Skin    Rashes or ulcers:        Constitutional    Fever or chills:      PHYSICAL EXAM:    Vitals:   03/25/18 1140  BP: (!) 144/81  Pulse: 61  Resp: 20  Temp: 98.4 F (36.9 C)  SpO2: 99%  Weight: 238 lb (108 kg)  Height: 5\' 7"  (1.702 m)    GENERAL: The patient is a well-nourished female, in no acute distress. The vital signs are documented above. CARDIAC: There is a regular rate and rhythm.  VASCULAR: Good thrill in right brachiobasilc fistula. Incision well healed.  Radial pulse palpable at wrist with no tissue loss.  DATA:   Fistula duplex suggest good flow volumes with a nicely dilated vein that is between 8 and 6 mm  Assessment/Plan:  Ms. Cockerham is doing well after her right first stage brachiobasilic fistula on 68/1/27.  She has a good thrill in the fistula today with no symptoms suggestive of steal and has nicely dilated vein on duplex.  We will schedule her for right upper extremity second stage brachiobasilic fistula at next available appointment.  She is not on dialysis at this time.   Marty Heck, MD [facility] [place] Office: [phone] Pager: [phone]

## 2018-04-07 NOTE — Anesthesia Procedure Notes (Signed)
Procedure Name: LMA Insertion Date/Time: 04/07/2018 12:34 PM Performed by: Verdie Drown, CRNA Pre-anesthesia Checklist: Patient identified, Emergency Drugs available, Suction available and Patient being monitored Patient Re-evaluated:Patient Re-evaluated prior to induction Oxygen Delivery Method: Circle system utilized Preoxygenation: Pre-oxygenation with 100% oxygen Induction Type: IV induction Ventilation: Mask ventilation without difficulty LMA: LMA inserted LMA Size: 4.0 Number of attempts: 1 Tube secured with: Tape Dental Injury: Teeth and Oropharynx as per pre-operative assessment  Comments: Performed by Anson Fret SRNA.

## 2018-04-07 NOTE — Progress Notes (Signed)
Per Dr. Fransisco Beau, as long as patient maintains oxygen saturations of 90% or greater on Room Air, pt may be discharged home.

## 2018-04-07 NOTE — Op Note (Signed)
    OPERATIVE NOTE   PROCEDURE: right second stage basilic vein transposition (brachiobasilic arteriovenous fistula) placement  PRE-OPERATIVE DIAGNOSIS: CKD  POST-OPERATIVE DIAGNOSIS: same  SURGEON: Marty Heck, MD  ASSISTANT(S): Arlee Muslim, PA  ANESTHESIA: LMA  ESTIMATED BLOOD LOSS: <50 mL  FINDING(S): Right upper arm basilic vein emptied into the deep brachial vein early and was small just before emptying.  Extensive mobilization of deep brachial vein to get enough length for transposition.  Palpable thrill in upper arm at end of case.  Vein marginal at best.   SPECIMEN(S):  None  INDICATIONS:   Erin Dean is a 52 y.o. female who presents for second stage right brachiobasilic transposition.  The patient is aware the risks include but are not limited to: bleeding, infection, steal syndrome, nerve damage, ischemic monomelic neuropathy, failure to mature, and need for additional procedures.  The patient is aware of the risks of the procedure and elects to proceed forward.   DESCRIPTION: After full informed written consent was obtained from the patient, the patient was brought back to the operating room and placed supine upon the operating table.  Prior to induction, the patient received IV antibiotics.   After obtaining adequate anesthesia, the patient was then prepped and draped in the standard fashion for a right arm access procedure.  I turned my attention first to identifying the patient's brachiobasilic arteriovenous fistula.  Using SonoSite guidance, the location of this fistula was marked out on the skin.    This was an excellent caliber vein in the lower arm near the antecubital fossa and looked smaller where it emptied into the deep brachial vein early in the mid arm.  I made three longitudinal incisions on the medial aspect of the right upper arm.  Through these incisions I dissected out circumferentially the basilic vein, taking care to protect the nerve.  Once  the vein was fully mobilized, all side branches were ligated between silk ties.  The vein was marked for orientation.  I then used a straight tunneler to create a subcutaneous tunnel.  The vein was then transected near the antecubital crease.  It was then brought to the previously created tunnel making sure to maintain proper orientation.  A primary anastomosis was then performed between the two cut ends of the vein with a running 5-0 Prolene.  Once this was done the clamps were released.  There was excellent flow through the fistula.  Hemostasis was then achieved.  The wound was irrigated.  The incision was closed with a deep layer of 2-0 Vicryl and 3-0 Vicryl followed by a subcutaneous 4-0 Monocryl and Dermabond.  There were no immediate complications.  COMPLICATIONS: None  CONDITION: Stable  Marty Heck, MD [facility] [place] Office: [phone] Pager: [phone]  04/07/2018, 2:43 PM

## 2018-04-07 NOTE — Anesthesia Preprocedure Evaluation (Addendum)
Anesthesia Evaluation  Patient identified by MRN, date of birth, ID band Patient awake    Reviewed: Allergy & Precautions, NPO status , Patient's Chart, lab work & pertinent test results, reviewed documented beta blocker date and time   Airway Mallampati: III  TM Distance: >3 FB Neck ROM: Full    Dental no notable dental hx. (+) Loose,    Pulmonary neg pulmonary ROS,    Pulmonary exam normal breath sounds clear to auscultation       Cardiovascular hypertension, Pt. on medications and Pt. on home beta blockers Normal cardiovascular exam Rhythm:Regular Rate:Normal  ECG: SB, Rate 53  ECHO: LV EF: 55% -   60%  Loop recorder  Sees cardiologist Gwenlyn Found)   Neuro/Psych Anxiety CVA, No Residual Symptoms    GI/Hepatic negative GI ROS, Neg liver ROS,   Endo/Other    Renal/GU ESRFRenal disease     Musculoskeletal negative musculoskeletal ROS (+)   Abdominal (+) + obese,   Peds  Hematology  (+) anemia , HLD   Anesthesia Other Findings chronic kidney disease  Reproductive/Obstetrics hcg negative                            Anesthesia Physical  Anesthesia Plan  ASA: III  Anesthesia Plan: MAC   Post-op Pain Management:    Induction: Intravenous  PONV Risk Score and Plan: 2 and Ondansetron and Dexamethasone  Airway Management Planned: LMA  Additional Equipment:   Intra-op Plan:   Post-operative Plan:   Informed Consent: I have reviewed the patients History and Physical, chart, labs and discussed the procedure including the risks, benefits and alternatives for the proposed anesthesia with the patient or authorized representative who has indicated his/her understanding and acceptance.   Dental advisory given  Plan Discussed with: CRNA, Anesthesiologist and Surgeon  Anesthesia Plan Comments:       Anesthesia Quick Evaluation

## 2018-04-07 NOTE — Discharge Instructions (Signed)
° °  [facility] [place] ° °Discharge Instructions ° °AV Fistula or Graft Surgery for Dialysis Access ° °Please refer to the following instructions for your post-procedure care. Your surgeon or physician assistant will discuss any changes with you. ° °Activity ° °You may drive the day following your surgery, if you are comfortable and no longer taking prescription pain medication. Resume full activity as the soreness in your incision resolves. ° °Bathing/Showering ° °You may shower after you go home. Keep your incision dry for 48 hours. Do not soak in a bathtub, hot tub, or swim until the incision heals completely. You may not shower if you have a hemodialysis catheter. ° °Incision Care ° °Clean your incision with mild soap and water after 48 hours. Pat the area dry with a clean towel. You do not need a bandage unless otherwise instructed. Do not apply any ointments or creams to your incision. You may have skin glue on your incision. Do not peel it off. It will come off on its own in about one week. Your arm may swell a bit after surgery. To reduce swelling use pillows to elevate your arm so it is above your heart. Your doctor will tell you if you need to lightly wrap your arm with an ACE bandage. ° °Diet ° °Resume your normal diet. There are not special food restrictions following this procedure. In order to heal from your surgery, it is CRITICAL to get adequate nutrition. Your body requires vitamins, minerals, and protein. Vegetables are the best source of vitamins and minerals. Vegetables also provide the perfect balance of protein. Processed food has little nutritional value, so try to avoid this. ° °Medications ° °Resume taking all of your medications. If your incision is causing pain, you may take over-the counter pain relievers such as acetaminophen (Tylenol). If you were prescribed a stronger pain medication, please be aware these medications can cause nausea and constipation. Prevent  nausea by taking the medication with a snack or meal. Avoid constipation by drinking plenty of fluids and eating foods with high amount of fiber, such as fruits, vegetables, and grains. Do not take Tylenol if you are taking prescription pain medications. ° ° ° ° °Follow up °Your surgeon may want to see you in the office following your access surgery. If so, this will be arranged at the time of your surgery. ° °Please call us immediately for any of the following conditions: ° °Increased pain, redness, drainage (pus) from your incision site °Fever of 101 degrees or higher °Severe or worsening pain at your incision site °Hand pain or numbness. ° °Reduce your risk of vascular disease: ° °Stop smoking. If you would like help, call [facility] at 1-800-QUIT-NOW (1-800-784-8669) or [place] at 336-586-4000 ° °Manage your cholesterol °Maintain a desired weight °Control your diabetes °Keep your blood pressure down ° °Dialysis ° °It will take several weeks to several months for your new dialysis access to be ready for use. Your surgeon will determine when it is OK to use it. Your nephrologist will continue to direct your dialysis. You can continue to use your Permcath until your new access is ready for use. ° °If you have any questions, please call the office at [phone]. ° °

## 2018-04-07 NOTE — Progress Notes (Signed)
Patients oxygen saturation dropped to 88 when she was sleeping.  Applied 2L oxygen nasal canula oxygen saturation came up to 90%

## 2018-04-08 ENCOUNTER — Encounter (HOSPITAL_COMMUNITY): Payer: Self-pay | Admitting: Vascular Surgery

## 2018-04-08 ENCOUNTER — Other Ambulatory Visit: Payer: Self-pay

## 2018-04-08 NOTE — Patient Outreach (Signed)
[facility]) Care Management  04/08/2018  Erin Dean 14-Jun-1965 [id number]  EMMI: stroke red alert Referral date: 03/26/18 Referral reason: went to follow up appointment: no Insurance" Blue cross blue shield Day # 13 Attempt #2  Telephone call to patient regarding EMMI stroke red alert. Unable to reach patient. HIPAA  Compliant voice message left with call back phone number.   PLAN; RNCM will attempt 3rd telephone call to patient withn 4 business days.   Quinn Plowman RN,BSN,CCM [facility] Telephonic  [phone]

## 2018-04-09 NOTE — Anesthesia Postprocedure Evaluation (Signed)
Anesthesia Post Note  Patient: MATAYAH REYBURN  Procedure(s) Performed: SECOND STAGE BASILIC VEIN TRANSPOSITION RIGHT ARM (Right )     Patient location during evaluation: PACU Anesthesia Type: MAC Level of consciousness: awake and alert Pain management: pain level controlled Vital Signs Assessment: post-procedure vital signs reviewed and stable Respiratory status: spontaneous breathing, nonlabored ventilation and respiratory function stable Cardiovascular status: stable and blood pressure returned to baseline Postop Assessment: no apparent nausea or vomiting Anesthetic complications: no    Last Vitals:  Vitals:   04/07/18 1710 04/07/18 1713  BP:    Pulse: (!) 48 (!) 49  Resp: 20 15  Temp:    SpO2: 95% 97%    Last Pain:  Vitals:   04/07/18 1645  TempSrc:   PainSc: 1    Pain Goal: Patients Stated Pain Goal: 4 (04/07/18 1515)               Lynda Rainwater

## 2018-04-10 ENCOUNTER — Telehealth: Payer: Self-pay | Admitting: Vascular Surgery

## 2018-04-10 NOTE — Telephone Encounter (Signed)
sch appt phone NA mld ltr 05/06/2018 330pm p/o MD

## 2018-04-10 NOTE — Telephone Encounter (Signed)
-----   Message from Dagoberto Ligas, PA-C sent at 04/07/2018  2:59 PM EST -----  Can you schedule an appt for this pt in 4 weeks with Dr. Carlis Abbott.  PO R 2nd stage basilic fistula.   [place]

## 2018-04-14 ENCOUNTER — Other Ambulatory Visit: Payer: Self-pay

## 2018-04-14 NOTE — Patient Outreach (Signed)
[name]) Care Management  04/14/2018  Erin Dean 12/02/1965 [id number]  EMMI:stroke red alert Referral date:03/26/18 Referral reason:went to follow up appointment: no Insurance" [employer] cross blue shield Day #13 Attempt #3  Telephone call to patient regarding EMMI stroke red alert. Unable to reach patient. HIPAA Compliant voice message left with call back phone number.   PLAN; RNCM will close patient due to being unable to reach   Quinn Plowman RN,BSN,CCM [facility] Telephonic  [phone]

## 2018-04-15 ENCOUNTER — Ambulatory Visit: Payer: Self-pay | Admitting: Family Medicine

## 2018-04-21 ENCOUNTER — Other Ambulatory Visit (HOSPITAL_COMMUNITY): Payer: Self-pay | Admitting: Nephrology

## 2018-04-21 DIAGNOSIS — N185 Chronic kidney disease, stage 5: Secondary | ICD-10-CM

## 2018-04-22 ENCOUNTER — Other Ambulatory Visit: Payer: Self-pay | Admitting: Physician Assistant

## 2018-04-22 ENCOUNTER — Other Ambulatory Visit: Payer: Self-pay | Admitting: Radiology

## 2018-04-23 ENCOUNTER — Ambulatory Visit (HOSPITAL_COMMUNITY)
Admission: RE | Admit: 2018-04-23 | Discharge: 2018-04-23 | Disposition: A | Discharge status: Home / Self Care | Payer: Medicaid Other | Source: Ambulatory Visit | Attending: Nephrology | Admitting: Nephrology

## 2018-04-23 ENCOUNTER — Other Ambulatory Visit: Payer: Self-pay

## 2018-04-23 ENCOUNTER — Encounter (HOSPITAL_COMMUNITY): Payer: Self-pay

## 2018-04-23 ENCOUNTER — Other Ambulatory Visit (HOSPITAL_COMMUNITY): Payer: Self-pay | Admitting: Nephrology

## 2018-04-23 DIAGNOSIS — N185 Chronic kidney disease, stage 5: Secondary | ICD-10-CM

## 2018-04-23 DIAGNOSIS — Z8673 Personal history of transient ischemic attack (TIA), and cerebral infarction without residual deficits: Secondary | ICD-10-CM | POA: Diagnosis not present

## 2018-04-23 DIAGNOSIS — I120 Hypertensive chronic kidney disease with stage 5 chronic kidney disease or end stage renal disease: Secondary | ICD-10-CM | POA: Diagnosis not present

## 2018-04-23 DIAGNOSIS — N186 End stage renal disease: Secondary | ICD-10-CM | POA: Diagnosis present

## 2018-04-23 DIAGNOSIS — I471 Supraventricular tachycardia: Secondary | ICD-10-CM | POA: Insufficient documentation

## 2018-04-23 DIAGNOSIS — Z79899 Other long term (current) drug therapy: Secondary | ICD-10-CM | POA: Diagnosis not present

## 2018-04-23 HISTORY — PX: IR US GUIDE VASC ACCESS RIGHT: IMG2390

## 2018-04-23 HISTORY — PX: IR FLUORO GUIDE CV LINE RIGHT: IMG2283

## 2018-04-23 LAB — PROTIME-INR
INR: 1.11
Prothrombin Time: 14.2 seconds (ref 11.4–15.2)

## 2018-04-23 LAB — CBC
HCT: 30.9 % — ABNORMAL LOW (ref 36.0–46.0)
Hemoglobin: 9.3 g/dL — ABNORMAL LOW (ref 12.0–15.0)
MCH: 28.8 pg (ref 26.0–34.0)
MCHC: 30.1 g/dL (ref 30.0–36.0)
MCV: 95.7 fL (ref 80.0–100.0)
Platelets: 241 10*3/uL (ref 150–400)
RBC: 3.23 MIL/uL — ABNORMAL LOW (ref 3.87–5.11)
RDW: 15.9 % — ABNORMAL HIGH (ref 11.5–15.5)
WBC: 8.8 10*3/uL (ref 4.0–10.5)
nRBC: 0 % (ref 0.0–0.2)

## 2018-04-23 MED ORDER — HEPARIN SODIUM (PORCINE) 1000 UNIT/ML IJ SOLN
INTRAMUSCULAR | Status: AC
  Administered 2018-04-23: 3.2 mL
  Filled 2018-04-23: qty 1

## 2018-04-23 MED ORDER — CEFAZOLIN SODIUM-DEXTROSE 2-4 GM/100ML-% IV SOLN
2.0000 g | Freq: Once | INTRAVENOUS | Status: AC
  Administered 2018-04-23: 2 g via INTRAVENOUS

## 2018-04-23 MED ORDER — LIDOCAINE HCL 1 % IJ SOLN
INTRAMUSCULAR | Status: AC
  Filled 2018-04-23: qty 20

## 2018-04-23 MED ORDER — FENTANYL CITRATE (PF) 100 MCG/2ML IJ SOLN
INTRAMUSCULAR | Status: DC | PRN
  Administered 2018-04-23 (×2): 50 ug via INTRAVENOUS

## 2018-04-23 MED ORDER — FENTANYL CITRATE (PF) 100 MCG/2ML IJ SOLN
INTRAMUSCULAR | Status: AC
  Filled 2018-04-23: qty 4

## 2018-04-23 MED ORDER — CEFAZOLIN SODIUM-DEXTROSE 2-4 GM/100ML-% IV SOLN
INTRAVENOUS | Status: AC
  Administered 2018-04-23: 2 g via INTRAVENOUS
  Filled 2018-04-23: qty 100

## 2018-04-23 MED ORDER — MIDAZOLAM HCL 2 MG/2ML IJ SOLN
INTRAMUSCULAR | Status: DC | PRN
  Administered 2018-04-23: 1 mg via INTRAVENOUS

## 2018-04-23 MED ORDER — LIDOCAINE HCL (PF) 1 % IJ SOLN
INTRAMUSCULAR | Status: DC | PRN
  Administered 2018-04-23: 10 mL

## 2018-04-23 MED ORDER — MIDAZOLAM HCL 2 MG/2ML IJ SOLN
INTRAMUSCULAR | Status: AC
  Filled 2018-04-23: qty 4

## 2018-04-23 MED ORDER — SODIUM CHLORIDE 0.9 % IV SOLN
INTRAVENOUS | Status: DC
  Administered 2018-04-23: 10:00:00 via INTRAVENOUS

## 2018-04-23 NOTE — Procedures (Signed)
Interventional Radiology Procedure Note  Procedure: Tunneled HD catheter placement  Complications: None  Estimated Blood Loss: < 10 mL  Findings: 19 cm tip to cuff length Palindrome HD catheter placed via right IJ. Tip in RA. OK to use.  Venetia Night. Kathlene Cote, M.D Pager:  [phone]

## 2018-04-23 NOTE — Discharge Instructions (Signed)
Central Line Dialysis Access Placement, Care After This sheet gives you information about how to care for yourself after your procedure. Your health care provider may also give you more specific instructions. If you have problems or questions, contact your health care provider. What can I expect after the procedure? After the procedure, it is common to have:  Mild pain or discomfort.  Mild redness, swelling, or bruising around your incision.  A small amount of blood or clear fluid coming from your incision. Follow these instructions at home: Incision care   Follow instructions from your health care provider about how to take care of your incision. Make sure you: ? Wash your hands with soap and water before you change your bandage (dressing). If soap and water are not available, use hand sanitizer. ? Change your dressing as told by your health care provider. ? Leave stitches (sutures) in place.  Check your incision area every day for signs of infection. Check for: ? More redness, swelling, or pain. ? More fluid or blood. ? Warmth. ? Pus or a bad smell. Medicines  Take over-the-counter and prescription medicines only as told by your health care provider.  If you were prescribed an antibiotic medicine, use it as told by your health care provider. Do not stop using the antibiotic even if you start to feel better. Activity  Return to your normal activities as told by your health care provider. Ask your health care provider what activities are safe for you.  Do not lift anything that is heavier than 10 lb (4.5 kg) until your health care provider says that this is safe. Driving  Do not drive for 24 hours if you were given a medicine to help you relax (sedative) during your procedure.  Do not drive or use heavy machinery while taking prescription pain medicine. Lifestyle  Limit alcohol intake to no more than 1 drink a day for nonpregnant women and 2 drinks a day for men. One drink  equals 12 oz of beer, 5 oz of wine, or 1 oz of hard liquor.  Do not use any products that contain nicotine or tobacco, such as cigarettes and e-cigarettes. If you need help quitting, ask your health care provider. General instructions  Do not take baths or showers, swim, or use a hot tub until your health care provider approves. You may only be allowed to take sponge baths for bathing.  Wear compression stockings as told by your health care provider. These stockings help to prevent blood clots and reduce swelling in your legs.  Follow instructions from your health care provider about eating or drinking restrictions.  Keep all follow-up visits as told by your health care provider. This is important. Contact a health care provider if:  Your catheter gets pulled out of place.  Your catheter site becomes itchy.  You develop a rash around your catheter site.  You have more redness, swelling, or pain around your incision.  You have more fluid or blood coming from your incision.  Your incision area feels warm to the touch.  You have pus or a bad smell coming from your incision.  You have a fever. Get help right away if:  You become light-headed or dizzy.  You faint.  You have difficulty breathing.  Your catheter gets pulled out completely. This information is not intended to replace advice given to you by your health care provider. Make sure you discuss any questions you have with your health care provider. Document Released: 11/08/2003 Document  Revised: 12/19/2015 Document Reviewed: 12/19/2015 Elsevier Interactive Patient Education  2019 [employer].  Moderate Conscious Sedation, Adult, Care After These instructions provide you with information about caring for yourself after your procedure. Your health care provider may also give you more specific instructions. Your treatment has been planned according to current medical practices, but problems sometimes occur. Call your  health care provider if you have any problems or questions after your procedure. What can I expect after the procedure? After your procedure, it is common:  To feel sleepy for several hours.  To feel clumsy and have poor balance for several hours.  To have poor judgment for several hours.  To vomit if you eat too soon. Follow these instructions at home: For at least 24 hours after the procedure:   Do not: ? Participate in activities where you could fall or become injured. ? Drive. ? Use heavy machinery. ? Drink alcohol. ? Take sleeping pills or medicines that cause drowsiness. ? Make important decisions or sign legal documents. ? Take care of children on your own.  Rest. Eating and drinking  Follow the diet recommended by your health care provider.  If you vomit: ? Drink water, juice, or soup when you can drink without vomiting. ? Make sure you have little or no nausea before eating solid foods. General instructions  Have a responsible adult stay with you until you are awake and alert.  Take over-the-counter and prescription medicines only as told by your health care provider.  If you smoke, do not smoke without supervision.  Keep all follow-up visits as told by your health care provider. This is important. Contact a health care provider if:  You keep feeling nauseous or you keep vomiting.  You feel light-headed.  You develop a rash.  You have a fever. Get help right away if:  You have trouble breathing. This information is not intended to replace advice given to you by your health care provider. Make sure you discuss any questions you have with your health care provider. Document Released: 01/14/2013 Document Revised: 08/29/2015 Document Reviewed: 07/16/2015 Elsevier Interactive Patient Education  2019 [employer].

## 2018-04-23 NOTE — H&P (Signed)
Chief Complaint: Patient was seen in consultation today for tunneled HD catheter placement.  Referring Physician(s): Upton,Elizabeth  Supervising Physician: Aletta Edouard  Patient Status: [name] - Out-pt  History of Present Illness: Erin Dean is a 53 y.o. female with a past medical history significant for HTN, CVA on Plavix, SVT and ESRD followed by Dr. Hollie Salk who presents today for tunneled HD catheter placement. Patient recently had right brachiocephalic AV fistula placed 04/07/18 by Dr. Carlis Abbott. She has not yet started dialysis, however she will be starting dialysis soon and her AVF is not yet matured - as such request has been made for tunneled HD catheter placement today in IR.  Patient reports she feels ok, has had several surgeries recently - her son is present and states she is out of pain medication and he feels she needs more. Patient reports some soreness to her right upper arm where AVF was placed, however denies any other pain today. She states understanding of procedure and wishes to proceed.   Past Medical History:  Diagnosis Date  . Chronic kidney disease    see Dr Hollie Salk at [facility]  . Hypertension   . Stroke ([place]) 04/2017   no residual   . SVT (supraventricular tachycardia) ([place])     Past Surgical History:  Procedure Laterality Date  . AV FISTULA PLACEMENT Right 08/16/2017   Procedure: CREATION OF RADIOCEPHALIC VERSUS BRACHIOCEPHALIC ARTERIOVENOUS FISTULA RIGHT ARM;  Surgeon: Conrad Monterey, MD;  Location: [place];  Service: Vascular;  Laterality: Right;  . AV FISTULA PLACEMENT Right 02/07/2018   Procedure: Creation Right arm Brachiocephalic Fistula;  Surgeon: Marty Heck, MD;  Location: [place];  Service: Vascular;  Laterality: Right;  . BASCILIC VEIN TRANSPOSITION Right 04/07/2018   Procedure: SECOND STAGE BASILIC VEIN TRANSPOSITION RIGHT ARM;  Surgeon: Marty Heck, MD;  Location: [place];  Service: Vascular;  Laterality: Right;  . CESAREAN  SECTION    . ESOPHAGOGASTRODUODENOSCOPY N/A 04/19/2017   Procedure: ESOPHAGOGASTRODUODENOSCOPY (EGD);  Surgeon: Carol Ada, MD;  Location: [place];  Service: Endoscopy;  Laterality: N/A;  . FISTULA SUPERFICIALIZATION Right 10/01/2017   Procedure: FISTULA SUPERFICIALIZATION RIGHT ARM;  Surgeon: Conrad Tolar, MD;  Location: [place];  Service: Vascular;  Laterality: Right;  . LOOP RECORDER INSERTION N/A 04/22/2017   Procedure: LOOP RECORDER INSERTION;  Surgeon: Constance Haw, MD;  Location: [place] [facility];  Service: Cardiovascular;  Laterality: N/A;  . TEE WITHOUT CARDIOVERSION N/A 04/19/2017   Procedure: TRANSESOPHAGEAL ECHOCARDIOGRAM (TEE);  Surgeon: Carol Ada, MD;  Location: [place];  Service: Endoscopy;  Laterality: N/A;    Allergies: Patient has no known allergies.  Medications: Prior to Admission medications   Medication Sig Start Date End Date Taking? Authorizing Provider  amLODipine (NORVASC) 10 MG tablet Take 1 tablet (10 mg total) by mouth daily. 03/11/18  Yes Donzetta Starch, NP  atorvastatin (LIPITOR) 40 MG tablet Take 1 tablet (40 mg total) by mouth daily at 6 PM. 03/11/18  Yes Biby, Massie Kluver, NP  calcitRIOL (ROCALTROL) 0.25 MCG capsule Take 1 capsule (0.25 mcg total) by mouth daily. 03/11/18  Yes Donzetta Starch, NP  carvedilol (COREG) 25 MG tablet Take 1 tablet (25 mg total) by mouth 2 (two) times daily with a meal. 03/11/18  Yes Donzetta Starch, NP  cloNIDine (CATAPRES) 0.1 MG tablet Take 1 tablet (0.1 mg total) by mouth 3 (three) times daily. 03/11/18  Yes Donzetta Starch, NP  clopidogrel (PLAVIX) 75 MG tablet Take  1 tablet (75 mg total) by mouth daily. 03/12/18  Yes Donzetta Starch, NP  cyanocobalamin 1000 MCG tablet Take 1 tablet (1,000 mcg total) by mouth daily. 03/11/18  Yes Donzetta Starch, NP  escitalopram (LEXAPRO) 10 MG tablet Take 1 tablet (10 mg total) by mouth daily. 03/11/18  Yes Donzetta Starch, NP  ferrous sulfate 325 (65 FE) MG tablet Take 1 tablet (325  mg total) by mouth 3 (three) times daily with meals. 03/11/18  Yes Donzetta Starch, NP  furosemide (LASIX) 40 MG tablet Take 1 tablet (40 mg total) by mouth See admin instructions. Take 40 mg tablet with 80 mg to equal 120 twice daily then take a third 40 mg dose by itself Patient taking differently: Take 40 mg by mouth 2 (two) times daily.  03/11/18  Yes Donzetta Starch, NP  furosemide (LASIX) 80 MG tablet Take 1 tablet (80 mg total) by mouth 2 (two) times daily. Take with 40 mg to equal 120 mg twice daily 03/11/18  Yes Donzetta Starch, NP  hydrALAZINE (APRESOLINE) 100 MG tablet Take 1 tablet (100 mg total) by mouth 3 (three) times daily. 03/11/18  Yes Donzetta Starch, NP  HYDROcodone-acetaminophen (NORCO) 5-325 MG tablet Take 1 tablet by mouth every 6 (six) hours as needed for up to 15 doses for moderate pain. 04/07/18  Yes Dagoberto Ligas, PA-C  pantoprazole (PROTONIX) 40 MG tablet Take 1 tablet (40 mg total) by mouth daily. 03/11/18  Yes Donzetta Starch, NP  acetaminophen (TYLENOL) 500 MG tablet Take 2 tablets (1,000 mg total) by mouth daily as needed for moderate pain or headache. 03/11/18   Donzetta Starch, NP     Family History  Problem Relation Age of Onset  . Heart attack Maternal Uncle   . Heart attack Maternal Grandfather   . Stroke Paternal Grandmother   . Cancer Father   . Hypertension Sister     Social History   Socioeconomic History  . Marital status: Legally Separated    Spouse name: Not on file  . Number of children: Not on file  . Years of education: Not on file  . Highest education level: Not on file  Occupational History  . Not on file  Social Needs  . Financial resource strain: Not on file  . Food insecurity:    Worry: Not on file    Inability: Not on file  . Transportation needs:    Medical: Not on file    Non-medical: Not on file  Tobacco Use  . Smoking status: Never Smoker  . Smokeless tobacco: Never Used  Substance and Sexual Activity  . Alcohol use: No  . Drug  use: No  . Sexual activity: Not on file  Lifestyle  . Physical activity:    Days per week: Not on file    Minutes per session: Not on file  . Stress: Not on file  Relationships  . Social connections:    Talks on phone: Not on file    Gets together: Not on file    Attends religious service: Not on file    Active member of club or organization: Not on file    Attends meetings of clubs or organizations: Not on file    Relationship status: Not on file  Other Topics Concern  . Not on file  Social History Narrative   Lives with daughter   Education- high school   On temp disability     Review of Systems: A  12 point ROS discussed and pertinent positives are indicated in the HPI above.  All other systems are negative.  Review of Systems  Constitutional: Negative for chills and fever.  Respiratory: Negative for cough and shortness of breath.   Cardiovascular: Negative for chest pain.  Gastrointestinal: Negative for abdominal pain, diarrhea, nausea and vomiting.  Genitourinary: Negative for dysuria.  Musculoskeletal:       (+) RUE pain s/p AVF placement  Neurological: Negative for dizziness, syncope and headaches.  Psychiatric/Behavioral: Negative for confusion.    Vital Signs: BP (!) 151/74   Pulse 63   Temp (!) 97.2 F (36.2 C) (Skin)   Ht 5\' 7"  (1.702 m)   Wt 253 lb (114.8 kg)   LMP 08/12/2017   SpO2 95%   BMI 39.63 kg/m   Physical Exam Vitals signs reviewed.  Constitutional:      General: She is not in acute distress.    Appearance: She is not ill-appearing.  HENT:     Head: Normocephalic.  Cardiovascular:     Rate and Rhythm: Normal rate and regular rhythm.     Comments: Right upper extremity AVF - (+) bruit Pulmonary:     Effort: Pulmonary effort is normal.  Abdominal:     Palpations: Abdomen is soft.     Tenderness: There is no abdominal tenderness.  Skin:    General: Skin is warm and dry.  Neurological:     Mental Status: She is alert and oriented to  person, place, and time.  Psychiatric:        Mood and Affect: Mood normal.        Behavior: Behavior normal.        Thought Content: Thought content normal.        Judgment: Judgment normal.      MD Evaluation Airway: WNL Heart: WNL Abdomen: WNL Chest/ Lungs: WNL ASA  Classification: 3 Mallampati/Airway Score: Two   Imaging: Vas US Duplex Dialysis Access (avf, Avg)  Result Date: 03/25/2018 DIALYSIS ACCESS Reason for Exam: Routine follow up. Access Site: Right Upper Extremity. Access Type: Basilic vein transposition. History: First Stage - 02/07/2018. Performing Technologist: Caralee Ates BA, RVT, RDMS  Examination Guidelines: A complete evaluation includes B-mode imaging, spectral Doppler, color Doppler, and power Doppler as needed of all accessible portions of each vessel. Unilateral testing is considered an integral part of a complete examination. Limited examinations for reoccurring indications may be performed as noted.  Findings: +--------------------+----------+-----------------+--------+ AVF                 PSV (cm/s)Flow Vol (mL/min)Comments +--------------------+----------+-----------------+--------+ Native artery inflow   374          1869                +--------------------+----------+-----------------+--------+ AVF Anastomosis        569                              +--------------------+----------+-----------------+--------+  +------------+----------+-------------+----------+------------------+ OUTFLOW VEINPSV (cm/s)Diameter (cm)Depth (cm)     Describe      +------------+----------+-------------+----------+------------------+ Prox UA        421        0.54        2.51   change in Diameter +------------+----------+-------------+----------+------------------+ Mid UA         369        0.56        2.22    competing branch  +------------+----------+-------------+----------+------------------+ Dist  UA        417        0.63        1.81                       +------------+----------+-------------+----------+------------------+ AC Fossa       485        0.83        1.48                      +------------+----------+-------------+----------+------------------+   Summary: Patent arteriovenous fistula. *See table(s) above for measurements and observations.  Diagnosing physician: Monica Martinez MD Electronically signed by Monica Martinez MD on 03/25/2018 at 12:15:36 PM.   --------------------------------------------------------------------------------   Final     Labs:  CBC: Recent Labs    03/09/18 1532 03/09/18 1535 03/10/18 0407 03/11/18 0345 04/07/18 0939  WBC 10.1  --  9.4 8.5  --   HGB 9.6* 10.9* 8.9* 9.0* 9.9*  HCT 34.6* 32.0* 31.9* 31.4* 29.0*  PLT 208  --  190 181  --     COAGS: Recent Labs    03/09/18 1532  INR 1.16  APTT 32    BMP: Recent Labs    03/09/18 1532 03/09/18 1535 03/10/18 0407 03/11/18 0345 04/07/18 0939  NA 140 142 141 140 144  K 4.0 4.1 3.9 4.0 3.8  CL 110 111 110 109  --   CO2 18*  --  20* 18*  --   GLUCOSE 117* 113* 101* 98 104*  BUN 62* 58* 68* 67*  --   CALCIUM 8.3*  --  8.0* 8.3*  --   CREATININE 5.87* 6.60* 6.10* 6.21*  --   GFRNONAA 8*  --  7* 7*  --   GFRAA 9*  --  8* 8*  --     LIVER FUNCTION TESTS: Recent Labs    03/09/18 1532  BILITOT 0.5  AST 20  ALT 14  ALKPHOS 86  PROT 7.5  ALBUMIN 3.2*    TUMOR MARKERS: No results for input(s): AFPTM, CEA, CA199, CHROMGRNA in the last 8760 hours.  Assessment and Plan:  Patient with ESRD not yet started on HD with recent AVF creation 04/07/18 - she is followed by Dr. Hollie Salk who requests placement of tunneled HD catheter today to initiate dialysis prior to maturation of AVF.   Patient has been NPO since 9 pm last evening except for a few sips of water with her medication this morning, she takes Plavix 75 mg QD with last dose this morning - she states she was told by person who called to schedule her appointment that she  did not need to hold her Plavix; this was discussed with Dr. Kathlene Cote who agrees to proceed with tunneled HD catheter placement. Increased bleeding risk discussed with patient who states understanding and wishes to proceed. Afebrile, WBC 8.8, hgb 9.3, plt 241, INR 1.11.  Risks and benefits discussed with the patient including, but not limited to bleeding, infection, vascular injury, pneumothorax which may require chest tube placement, air embolism or even death.  All of the patient's questions were answered, patient is agreeable to proceed.  Consent signed and in chart.  Thank you for this interesting consult.  I greatly enjoyed meeting Erin Dean and look forward to participating in their care.  A copy of this report was sent to the requesting provider on this date.  Electronically Signed: Joaquim Nam, PA-C 04/23/2018, 9:13 AM  I spent a total of  30 Minutes   in face to face in clinical consultation, greater than 50% of which was counseling/coordinating care for tunneled HD catheter placement.

## 2018-04-23 NOTE — Sedation Documentation (Signed)
Patient denies pain and is resting comfortably.  

## 2018-04-29 ENCOUNTER — Encounter (HOSPITAL_COMMUNITY): Payer: Self-pay

## 2018-04-29 ENCOUNTER — Ambulatory Visit: Payer: Self-pay | Admitting: Family Medicine

## 2018-05-06 ENCOUNTER — Encounter: Payer: Self-pay | Admitting: Vascular Surgery

## 2018-05-06 ENCOUNTER — Other Ambulatory Visit: Payer: Self-pay

## 2018-05-06 ENCOUNTER — Ambulatory Visit (INDEPENDENT_AMBULATORY_CARE_PROVIDER_SITE_OTHER): Payer: Self-pay | Admitting: Vascular Surgery

## 2018-05-06 DIAGNOSIS — Z992 Dependence on renal dialysis: Secondary | ICD-10-CM

## 2018-05-06 DIAGNOSIS — N186 End stage renal disease: Secondary | ICD-10-CM

## 2018-05-06 NOTE — Progress Notes (Signed)
Patient name: Erin Dean MRN: [id number] DOB: [birth year] Sex: female  REASON FOR VISIT: Status post right second stage brachiobasilic fistula on 42/35/3614  HPI: Erin Dean is a 53 y.o. female with end-stage renal disease that underwent a second stage right upper extremity brachiobasilic fistula on 43/15/4008.  She reports that she had a little bit of pain along her medial arm incisions that can be shooting at times.  No pain in the right hand itself.  Denies any numbness or weakness in the hand.  She has a good thrill in the fistula.  She is using a tunneled right IJ catheter at this time.  Past Medical History:  Diagnosis Date  . Chronic kidney disease    see Dr Hollie Salk at [facility]  . Hypertension   . Stroke ([place]) 04/2017   no residual   . SVT (supraventricular tachycardia) ([place])     Past Surgical History:  Procedure Laterality Date  . AV FISTULA PLACEMENT Right 08/16/2017   Procedure: CREATION OF RADIOCEPHALIC VERSUS BRACHIOCEPHALIC ARTERIOVENOUS FISTULA RIGHT ARM;  Surgeon: Conrad [place], MD;  Location: [place];  Service: Vascular;  Laterality: Right;  . AV FISTULA PLACEMENT Right 02/07/2018   Procedure: Creation Right arm Brachiocephalic Fistula;  Surgeon: Marty Heck, MD;  Location: [place];  Service: Vascular;  Laterality: Right;  . BASCILIC VEIN TRANSPOSITION Right 04/07/2018   Procedure: SECOND STAGE BASILIC VEIN TRANSPOSITION RIGHT ARM;  Surgeon: Marty Heck, MD;  Location: [place];  Service: Vascular;  Laterality: Right;  . CESAREAN SECTION    . ESOPHAGOGASTRODUODENOSCOPY N/A 04/19/2017   Procedure: ESOPHAGOGASTRODUODENOSCOPY (EGD);  Surgeon: Carol Ada, MD;  Location: [place];  Service: Endoscopy;  Laterality: N/A;  . FISTULA SUPERFICIALIZATION Right 10/01/2017   Procedure: FISTULA SUPERFICIALIZATION RIGHT ARM;  Surgeon: Conrad [place], MD;  Location: [place];  Service: Vascular;  Laterality: Right;  . IR FLUORO GUIDE CV LINE RIGHT  04/23/2018    . IR US GUIDE VASC ACCESS RIGHT  04/23/2018  . LOOP RECORDER INSERTION N/A 04/22/2017   Procedure: LOOP RECORDER INSERTION;  Surgeon: Constance Haw, MD;  Location: [place] CV [facility];  Service: Cardiovascular;  Laterality: N/A;  . TEE WITHOUT CARDIOVERSION N/A 04/19/2017   Procedure: TRANSESOPHAGEAL ECHOCARDIOGRAM (TEE);  Surgeon: Carol Ada, MD;  Location: [place];  Service: Endoscopy;  Laterality: N/A;    Family History  Problem Relation Age of Onset  . Heart attack Maternal Uncle   . Heart attack Maternal Grandfather   . Stroke Paternal Grandmother   . Cancer Father   . Hypertension Sister     SOCIAL HISTORY: Social History   Tobacco Use  . Smoking status: Never Smoker  . Smokeless tobacco: Never Used  Substance Use Topics  . Alcohol use: No    No Known Allergies  Current Outpatient Medications  Medication Sig Dispense Refill  . acetaminophen (TYLENOL) 500 MG tablet Take 2 tablets (1,000 mg total) by mouth daily as needed for moderate pain or headache. 30 tablet 0  . amLODipine (NORVASC) 10 MG tablet Take 1 tablet (10 mg total) by mouth daily. 180 tablet 3  . atorvastatin (LIPITOR) 40 MG tablet Take 1 tablet (40 mg total) by mouth daily at 6 PM. 30 tablet 1  . calcitRIOL (ROCALTROL) 0.25 MCG capsule Take 1 capsule (0.25 mcg total) by mouth daily. 30 capsule 2  . carvedilol (COREG) 25 MG tablet Take 1 tablet (25 mg total) by mouth 2 (two) times daily with a meal. 60  tablet 2  . cloNIDine (CATAPRES) 0.1 MG tablet Take 1 tablet (0.1 mg total) by mouth 3 (three) times daily. 60 tablet 11  . clopidogrel (PLAVIX) 75 MG tablet Take 1 tablet (75 mg total) by mouth daily. 30 tablet 2  . cyanocobalamin 1000 MCG tablet Take 1 tablet (1,000 mcg total) by mouth daily. 30 tablet 2  . escitalopram (LEXAPRO) 10 MG tablet Take 1 tablet (10 mg total) by mouth daily. 30 tablet 2  . ferrous sulfate 325 (65 FE) MG tablet Take 1 tablet (325 mg total) by mouth 3 (three) times daily  with meals. 90 tablet 2  . furosemide (LASIX) 40 MG tablet Take 1 tablet (40 mg total) by mouth See admin instructions. Take 40 mg tablet with 80 mg to equal 120 twice daily then take a third 40 mg dose by itself (Patient taking differently: Take 40 mg by mouth 2 (two) times daily. ) 60 tablet 2  . furosemide (LASIX) 80 MG tablet Take 1 tablet (80 mg total) by mouth 2 (two) times daily. Take with 40 mg to equal 120 mg twice daily 60 tablet 2  . hydrALAZINE (APRESOLINE) 100 MG tablet Take 1 tablet (100 mg total) by mouth 3 (three) times daily. 30 tablet 2  . HYDROcodone-acetaminophen (NORCO) 5-325 MG tablet Take 1 tablet by mouth every 6 (six) hours as needed for up to 15 doses for moderate pain. 15 tablet 0  . multivitamin (RENA-VIT) TABS tablet Take 1 tablet by mouth daily.    . pantoprazole (PROTONIX) 40 MG tablet Take 1 tablet (40 mg total) by mouth daily. 30 tablet 2   No current facility-administered medications for this visit.     REVIEW OF SYSTEMS:  [X]  denotes positive finding, [ ]  denotes negative finding Cardiac  Comments:  Chest pain or chest pressure:    Shortness of breath upon exertion:    Short of breath when lying flat:    Irregular heart rhythm:        Vascular    Pain in calf, thigh, or hip brought on by ambulation:    Pain in feet at night that wakes you up from your sleep:     Blood clot in your veins:    Leg swelling:         Pulmonary    Oxygen at home:    Productive cough:     Wheezing:         Neurologic    Sudden weakness in arms or legs:     Sudden numbness in arms or legs:     Sudden onset of difficulty speaking or slurred speech:    Temporary loss of vision in one eye:     Problems with dizziness:         Gastrointestinal    Blood in stool:     Vomited blood:         Genitourinary    Burning when urinating:     Blood in urine:        Psychiatric    Major depression:         Hematologic    Bleeding problems:    Problems with blood clotting  too easily:        Skin    Rashes or ulcers:        Constitutional    Fever or chills:      PHYSICAL EXAM: Vitals:   05/06/18 1540  BP: (!) 194/106  Pulse: 73  Resp: 20  Temp:  97.8 F (36.6 C)  SpO2: 93%  Weight: 253 lb (114.8 kg)  Height: 5\' 7"  (1.702 m)    GENERAL: The patient is a well-nourished female, in no acute distress. The vital signs are documented above. CARDIAC: There is a regular rate and rhythm.  VASCULAR:  Right upper arm incisions well healed Thrill in right upper arm fistula Palpable radial pulse at wrist  DATA:   None  Assessment/Plan:  53 year old female status post right second stage brachiobasilic fistula on 11/94/1740.  Fistula has a good thrill and after another month or two should be appropriate for use.  Discussed with the patient that I would favor them utilizing the fistula first to ensure it works before removing her catheter.  All of her incisions have healed without issue and she has no steal symptoms.   Marty Heck, MD [facility] [place] Office: [phone] Pager: [phone]

## 2018-05-16 ENCOUNTER — Ambulatory Visit: Payer: BLUE CROSS/BLUE SHIELD | Admitting: Diagnostic Neuroimaging

## 2018-06-02 ENCOUNTER — Other Ambulatory Visit: Payer: Self-pay

## 2018-06-02 NOTE — Patient Outreach (Signed)
Telephone outreach to patient to obtain mRs was successfully completed. mRs= 0. 

## 2018-06-10 ENCOUNTER — Ambulatory Visit: Payer: BLUE CROSS/BLUE SHIELD | Admitting: Diagnostic Neuroimaging

## 2018-06-10 ENCOUNTER — Telehealth: Payer: Self-pay | Admitting: *Deleted

## 2018-06-10 NOTE — Telephone Encounter (Signed)
Patient called this morning and canceled her FU this afternoon, stated she is sick.

## 2018-06-11 ENCOUNTER — Ambulatory Visit: Payer: Self-pay | Admitting: Physical Therapy

## 2018-06-11 ENCOUNTER — Encounter: Payer: Self-pay | Admitting: Diagnostic Neuroimaging

## 2018-06-11 ENCOUNTER — Ambulatory Visit: Payer: Self-pay | Admitting: Occupational Therapy

## 2018-06-19 DIAGNOSIS — Z0279 Encounter for issue of other medical certificate: Secondary | ICD-10-CM | POA: Diagnosis not present

## 2018-08-04 ENCOUNTER — Other Ambulatory Visit: Payer: Self-pay

## 2018-08-04 DIAGNOSIS — N186 End stage renal disease: Secondary | ICD-10-CM

## 2018-08-04 DIAGNOSIS — Z992 Dependence on renal dialysis: Secondary | ICD-10-CM

## 2018-08-11 ENCOUNTER — Ambulatory Visit (INDEPENDENT_AMBULATORY_CARE_PROVIDER_SITE_OTHER)
Admission: RE | Admit: 2018-08-11 | Discharge: 2018-08-11 | Disposition: A | Discharge status: Home / Self Care | Payer: Medicare Other | Source: Ambulatory Visit | Attending: Surgery | Admitting: Surgery

## 2018-08-11 ENCOUNTER — Ambulatory Visit (INDEPENDENT_AMBULATORY_CARE_PROVIDER_SITE_OTHER): Payer: Medicaid Other | Admitting: Surgery

## 2018-08-11 ENCOUNTER — Other Ambulatory Visit: Payer: Self-pay

## 2018-08-11 ENCOUNTER — Encounter: Payer: Self-pay | Admitting: Surgery

## 2018-08-11 ENCOUNTER — Ambulatory Visit (HOSPITAL_COMMUNITY)
Admission: RE | Admit: 2018-08-11 | Discharge: 2018-08-11 | Disposition: A | Discharge status: Home / Self Care | Payer: Medicare Other | Source: Ambulatory Visit | Attending: Surgery | Admitting: Surgery

## 2018-08-11 ENCOUNTER — Encounter: Payer: Self-pay | Admitting: *Deleted

## 2018-08-11 ENCOUNTER — Other Ambulatory Visit: Payer: Self-pay | Admitting: Surgery

## 2018-08-11 VITALS — BP 194/102 | HR 60 | Temp 97.8°F | Resp 14 | Ht 68.0 in | Wt 233.0 lb

## 2018-08-11 DIAGNOSIS — N186 End stage renal disease: Secondary | ICD-10-CM | POA: Diagnosis present

## 2018-08-11 DIAGNOSIS — Z992 Dependence on renal dialysis: Secondary | ICD-10-CM

## 2018-08-11 NOTE — H&P (View-Only) (Signed)
[facility] [place]  Patient name: Erin Dean MRN: [id number] DOB: January 05, 1966 Sex: female   REASON FOR VISIT:    Follow up ESRD  HISOTRY OF PRESENT ILLNESS:    Erin Dean is a 53 y.o. female who is here today for new access.  She has had the following procedures:  08-16-2017:  R BCF 10-01-2017:  Superficialization of right BCF 02-07-2018:  Right 1st stage BVT 04-07-2018:  2nd stage BVT  She has dialysis on Tuesday Thursday Saturday.  She does not have any history of pacemaker or port placement on the left side.  The patient's renal failure secondary to hypertension.  She does have a history of stroke with no residual symptoms.  She is a non-smoker.  She takes a statin for hypercholesterolemia.  PAST MEDICAL HISTORY:   Past Medical History:  Diagnosis Date  . Chronic kidney disease    see Dr Hollie Salk at [facility]  . Hypertension   . Stroke ([place]) 04/2017   no residual   . SVT (supraventricular tachycardia) ([place])      FAMILY HISTORY:   Family History  Problem Relation Age of Onset  . Heart attack Maternal Uncle   . Heart attack Maternal Grandfather   . Stroke Paternal Grandmother   . Cancer Father   . Hypertension Sister     SOCIAL HISTORY:   Social History   Tobacco Use  . Smoking status: Never Smoker  . Smokeless tobacco: Never Used  Substance Use Topics  . Alcohol use: No     ALLERGIES:   No Known Allergies   CURRENT MEDICATIONS:   Current Outpatient Medications  Medication Sig Dispense Refill  . acetaminophen (TYLENOL) 500 MG tablet Take 2 tablets (1,000 mg total) by mouth daily as needed for moderate pain or headache. 30 tablet 0  . amLODipine (NORVASC) 10 MG tablet Take 1 tablet (10 mg total) by mouth daily. 180 tablet 3  . atorvastatin (LIPITOR) 40 MG tablet Take 1 tablet (40 mg total) by mouth daily at 6 PM. 30 tablet 1  . calcitRIOL (ROCALTROL) 0.25 MCG capsule Take 1  capsule (0.25 mcg total) by mouth daily. 30 capsule 2  . carvedilol (COREG) 25 MG tablet Take 1 tablet (25 mg total) by mouth 2 (two) times daily with a meal. 60 tablet 2  . cloNIDine (CATAPRES) 0.1 MG tablet Take 1 tablet (0.1 mg total) by mouth 3 (three) times daily. 60 tablet 11  . clopidogrel (PLAVIX) 75 MG tablet Take 1 tablet (75 mg total) by mouth daily. 30 tablet 2  . cyanocobalamin 1000 MCG tablet Take 1 tablet (1,000 mcg total) by mouth daily. 30 tablet 2  . escitalopram (LEXAPRO) 10 MG tablet Take 1 tablet (10 mg total) by mouth daily. 30 tablet 2  . ferrous sulfate 325 (65 FE) MG tablet Take 1 tablet (325 mg total) by mouth 3 (three) times daily with meals. 90 tablet 2  . furosemide (LASIX) 40 MG tablet Take 1 tablet (40 mg total) by mouth See admin instructions. Take 40 mg tablet with 80 mg to equal 120 twice daily then take a third 40 mg dose by itself (Patient taking differently: Take 40 mg by mouth 2 (two) times daily. ) 60 tablet 2  . furosemide (LASIX) 80 MG tablet Take 1 tablet (80 mg total) by mouth 2 (two) times daily. Take with 40 mg to equal 120 mg twice daily 60 tablet 2  . hydrALAZINE (APRESOLINE) 100 MG tablet Take  1 tablet (100 mg total) by mouth 3 (three) times daily. 30 tablet 2  . HYDROcodone-acetaminophen (NORCO) 5-325 MG tablet Take 1 tablet by mouth every 6 (six) hours as needed for up to 15 doses for moderate pain. 15 tablet 0  . multivitamin (RENA-VIT) TABS tablet Take 1 tablet by mouth daily.    . pantoprazole (PROTONIX) 40 MG tablet Take 1 tablet (40 mg total) by mouth daily. 30 tablet 2   No current facility-administered medications for this visit.     REVIEW OF SYSTEMS:   [X]  denotes positive finding, [ ]  denotes negative finding Cardiac  Comments:  Chest pain or chest pressure:    Shortness of breath upon exertion:    Short of breath when lying flat:    Irregular heart rhythm:        Vascular    Pain in calf, thigh, or hip brought on by ambulation:     Pain in feet at night that wakes you up from your sleep:     Blood clot in your veins:    Leg swelling:         Pulmonary    Oxygen at home:    Productive cough:     Wheezing:         Neurologic    Sudden weakness in arms or legs:     Sudden numbness in arms or legs:     Sudden onset of difficulty speaking or slurred speech:    Temporary loss of vision in one eye:     Problems with dizziness:         Gastrointestinal    Blood in stool:     Vomited blood:         Genitourinary    Burning when urinating:     Blood in urine:        Psychiatric    Major depression:         Hematologic    Bleeding problems:    Problems with blood clotting too easily:        Skin    Rashes or ulcers:        Constitutional    Fever or chills:      PHYSICAL EXAM:   Vitals:   08/11/18 1205 08/11/18 1208  BP: (!) 200/97 (!) 194/102  Pulse: 60 60  Resp: 14   Temp: 97.8 F (36.6 C)   TempSrc: Oral   SpO2: 98%   Weight: 105.7 kg   Height: 5\' 8"  (1.727 m)     GENERAL: The patient is a well-nourished female, in no acute distress. The vital signs are documented above. CARDIAC: There is a regular rate and rhythm.  VASCULAR: Palpable left brachial pulse PULMONARY: Non-labored respirations  MUSCULOSKELETAL: There are no major deformities or cyanosis. NEUROLOGIC: No focal weakness or paresthesias are detected. SKIN: There are no ulcers or rashes noted. PSYCHIATRIC: The patient has a normal affect.  STUDIES:   I have ordered and reviewed the following studies:  +-----------------+-------------+----------+--------+ Left Cephalic    Diameter (cm)Depth (cm)Findings +-----------------+-------------+----------+--------+ Shoulder             0.33                        +-----------------+-------------+----------+--------+ Prox upper arm       0.41                        +-----------------+-------------+----------+--------+ Mid upper arm  0.47                         +-----------------+-------------+----------+--------+ Dist upper arm       0.42                        +-----------------+-------------+----------+--------+ Antecubital fossa    0.67                        +-----------------+-------------+----------+--------+ Prox forearm         0.27                        +-----------------+-------------+----------+--------+ Mid forearm          0.31                        +-----------------+-------------+----------+--------+ Dist forearm         0.27                        +-----------------+-------------+----------+--------+  +-----------------+-------------+----------+--------+ Left Basilic     Diameter (cm)Depth (cm)Findings +-----------------+-------------+----------+--------+ Prox upper arm       0.56                        +-----------------+-------------+----------+--------+ Mid upper arm        0.32                        +-----------------+-------------+----------+--------+ Dist upper arm       0.32                        +-----------------+-------------+----------+--------+ Antecubital fossa    0.24                        +-----------------+-------------+----------+--------+ Prox forearm         0.27                         MEDICAL ISSUES:   ESRD: We discussed proceeding with left arm fistula creation.  I evaluated her cephalic vein in the forearm and did not feel it was adequate in the office therefore I think her next operation is a left brachiocephalic fistula.  I told her that she would most likely need elevation of her fistula.  Because she has gone through this on the right arm without success, I wonder if she has some underlying hypercoagulable state.  She seems to have tolerated her initial operation but shortly after the elevation procedure her fistula has occluded.  Therefore after elevation of her fistula, I will consider adding an additional form of anticoagulation such  as low-dose Coumadin. I have her scheduled for fistula creation of the left brachiocephalic vein this Friday.    Leia Alf, MD, FACS [facility] [phone] Pager [phone]

## 2018-08-11 NOTE — Progress Notes (Signed)
[facility] [place]  Patient name: Erin Dean MRN: [id number] DOB: 03/29/1966 Sex: female   REASON FOR VISIT:    Follow up ESRD  HISOTRY OF PRESENT ILLNESS:    Erin Dean is a 53 y.o. female who is here today for new access.  She has had the following procedures:  08-16-2017:  R BCF 10-01-2017:  Superficialization of right BCF 02-07-2018:  Right 1st stage BVT 04-07-2018:  2nd stage BVT  She has dialysis on Tuesday Thursday Saturday.  She does not have any history of pacemaker or port placement on the left side.  The patient's renal failure secondary to hypertension.  She does have a history of stroke with no residual symptoms.  She is a non-smoker.  She takes a statin for hypercholesterolemia.  PAST MEDICAL HISTORY:   Past Medical History:  Diagnosis Date  . Chronic kidney disease    see Dr Hollie Salk at [facility]  . Hypertension   . Stroke ([place]) 04/2017   no residual   . SVT (supraventricular tachycardia) ([place])      FAMILY HISTORY:   Family History  Problem Relation Age of Onset  . Heart attack Maternal Uncle   . Heart attack Maternal Grandfather   . Stroke Paternal Grandmother   . Cancer Father   . Hypertension Sister     SOCIAL HISTORY:   Social History   Tobacco Use  . Smoking status: Never Smoker  . Smokeless tobacco: Never Used  Substance Use Topics  . Alcohol use: No     ALLERGIES:   No Known Allergies   CURRENT MEDICATIONS:   Current Outpatient Medications  Medication Sig Dispense Refill  . acetaminophen (TYLENOL) 500 MG tablet Take 2 tablets (1,000 mg total) by mouth daily as needed for moderate pain or headache. 30 tablet 0  . amLODipine (NORVASC) 10 MG tablet Take 1 tablet (10 mg total) by mouth daily. 180 tablet 3  . atorvastatin (LIPITOR) 40 MG tablet Take 1 tablet (40 mg total) by mouth daily at 6 PM. 30 tablet 1  . calcitRIOL (ROCALTROL) 0.25 MCG capsule Take 1  capsule (0.25 mcg total) by mouth daily. 30 capsule 2  . carvedilol (COREG) 25 MG tablet Take 1 tablet (25 mg total) by mouth 2 (two) times daily with a meal. 60 tablet 2  . cloNIDine (CATAPRES) 0.1 MG tablet Take 1 tablet (0.1 mg total) by mouth 3 (three) times daily. 60 tablet 11  . clopidogrel (PLAVIX) 75 MG tablet Take 1 tablet (75 mg total) by mouth daily. 30 tablet 2  . cyanocobalamin 1000 MCG tablet Take 1 tablet (1,000 mcg total) by mouth daily. 30 tablet 2  . escitalopram (LEXAPRO) 10 MG tablet Take 1 tablet (10 mg total) by mouth daily. 30 tablet 2  . ferrous sulfate 325 (65 FE) MG tablet Take 1 tablet (325 mg total) by mouth 3 (three) times daily with meals. 90 tablet 2  . furosemide (LASIX) 40 MG tablet Take 1 tablet (40 mg total) by mouth See admin instructions. Take 40 mg tablet with 80 mg to equal 120 twice daily then take a third 40 mg dose by itself (Patient taking differently: Take 40 mg by mouth 2 (two) times daily. ) 60 tablet 2  . furosemide (LASIX) 80 MG tablet Take 1 tablet (80 mg total) by mouth 2 (two) times daily. Take with 40 mg to equal 120 mg twice daily 60 tablet 2  . hydrALAZINE (APRESOLINE) 100 MG tablet Take  1 tablet (100 mg total) by mouth 3 (three) times daily. 30 tablet 2  . HYDROcodone-acetaminophen (NORCO) 5-325 MG tablet Take 1 tablet by mouth every 6 (six) hours as needed for up to 15 doses for moderate pain. 15 tablet 0  . multivitamin (RENA-VIT) TABS tablet Take 1 tablet by mouth daily.    . pantoprazole (PROTONIX) 40 MG tablet Take 1 tablet (40 mg total) by mouth daily. 30 tablet 2   No current facility-administered medications for this visit.     REVIEW OF SYSTEMS:   [X]  denotes positive finding, [ ]  denotes negative finding Cardiac  Comments:  Chest pain or chest pressure:    Shortness of breath upon exertion:    Short of breath when lying flat:    Irregular heart rhythm:        Vascular    Pain in calf, thigh, or hip brought on by ambulation:     Pain in feet at night that wakes you up from your sleep:     Blood clot in your veins:    Leg swelling:         Pulmonary    Oxygen at home:    Productive cough:     Wheezing:         Neurologic    Sudden weakness in arms or legs:     Sudden numbness in arms or legs:     Sudden onset of difficulty speaking or slurred speech:    Temporary loss of vision in one eye:     Problems with dizziness:         Gastrointestinal    Blood in stool:     Vomited blood:         Genitourinary    Burning when urinating:     Blood in urine:        Psychiatric    Major depression:         Hematologic    Bleeding problems:    Problems with blood clotting too easily:        Skin    Rashes or ulcers:        Constitutional    Fever or chills:      PHYSICAL EXAM:   Vitals:   08/11/18 1205 08/11/18 1208  BP: (!) 200/97 (!) 194/102  Pulse: 60 60  Resp: 14   Temp: 97.8 F (36.6 C)   TempSrc: Oral   SpO2: 98%   Weight: 105.7 kg   Height: 5\' 8"  (1.727 m)     GENERAL: The patient is a well-nourished female, in no acute distress. The vital signs are documented above. CARDIAC: There is a regular rate and rhythm.  VASCULAR: Palpable left brachial pulse PULMONARY: Non-labored respirations  MUSCULOSKELETAL: There are no major deformities or cyanosis. NEUROLOGIC: No focal weakness or paresthesias are detected. SKIN: There are no ulcers or rashes noted. PSYCHIATRIC: The patient has a normal affect.  STUDIES:   I have ordered and reviewed the following studies:  +-----------------+-------------+----------+--------+ Left Cephalic    Diameter (cm)Depth (cm)Findings +-----------------+-------------+----------+--------+ Shoulder             0.33                        +-----------------+-------------+----------+--------+ Prox upper arm       0.41                        +-----------------+-------------+----------+--------+ Mid upper arm  0.47                         +-----------------+-------------+----------+--------+ Dist upper arm       0.42                        +-----------------+-------------+----------+--------+ Antecubital fossa    0.67                        +-----------------+-------------+----------+--------+ Prox forearm         0.27                        +-----------------+-------------+----------+--------+ Mid forearm          0.31                        +-----------------+-------------+----------+--------+ Dist forearm         0.27                        +-----------------+-------------+----------+--------+  +-----------------+-------------+----------+--------+ Left Basilic     Diameter (cm)Depth (cm)Findings +-----------------+-------------+----------+--------+ Prox upper arm       0.56                        +-----------------+-------------+----------+--------+ Mid upper arm        0.32                        +-----------------+-------------+----------+--------+ Dist upper arm       0.32                        +-----------------+-------------+----------+--------+ Antecubital fossa    0.24                        +-----------------+-------------+----------+--------+ Prox forearm         0.27                         MEDICAL ISSUES:   ESRD: We discussed proceeding with left arm fistula creation.  I evaluated her cephalic vein in the forearm and did not feel it was adequate in the office therefore I think her next operation is a left brachiocephalic fistula.  I told her that she would most likely need elevation of her fistula.  Because she has gone through this on the right arm without success, I wonder if she has some underlying hypercoagulable state.  She seems to have tolerated her initial operation but shortly after the elevation procedure her fistula has occluded.  Therefore after elevation of her fistula, I will consider adding an additional form of anticoagulation such  as low-dose Coumadin. I have her scheduled for fistula creation of the left brachiocephalic vein this Friday.    Leia Alf, MD, FACS [facility] [phone] Pager [phone]

## 2018-08-14 ENCOUNTER — Other Ambulatory Visit: Payer: Self-pay

## 2018-08-14 ENCOUNTER — Encounter (HOSPITAL_COMMUNITY): Payer: Self-pay | Admitting: *Deleted

## 2018-08-15 ENCOUNTER — Encounter (HOSPITAL_COMMUNITY): Payer: Self-pay | Admitting: *Deleted

## 2018-08-15 ENCOUNTER — Encounter (HOSPITAL_COMMUNITY)
Admission: RE | Disposition: A | Discharge status: Home / Self Care | Payer: Self-pay | Source: Home / Self Care | Attending: Surgery

## 2018-08-15 ENCOUNTER — Ambulatory Visit (HOSPITAL_COMMUNITY): Payer: Medicare Other | Admitting: Physician Assistant

## 2018-08-15 ENCOUNTER — Ambulatory Visit (HOSPITAL_COMMUNITY)
Admission: RE | Admit: 2018-08-15 | Discharge: 2018-08-15 | Disposition: A | Discharge status: Home / Self Care | Payer: Medicare Other | Attending: Surgery | Admitting: Surgery

## 2018-08-15 ENCOUNTER — Other Ambulatory Visit: Payer: Self-pay

## 2018-08-15 DIAGNOSIS — E785 Hyperlipidemia, unspecified: Secondary | ICD-10-CM | POA: Insufficient documentation

## 2018-08-15 DIAGNOSIS — N186 End stage renal disease: Secondary | ICD-10-CM | POA: Diagnosis not present

## 2018-08-15 DIAGNOSIS — F419 Anxiety disorder, unspecified: Secondary | ICD-10-CM | POA: Diagnosis not present

## 2018-08-15 DIAGNOSIS — E7800 Pure hypercholesterolemia, unspecified: Secondary | ICD-10-CM | POA: Diagnosis not present

## 2018-08-15 DIAGNOSIS — D631 Anemia in chronic kidney disease: Secondary | ICD-10-CM | POA: Insufficient documentation

## 2018-08-15 DIAGNOSIS — I120 Hypertensive chronic kidney disease with stage 5 chronic kidney disease or end stage renal disease: Secondary | ICD-10-CM | POA: Insufficient documentation

## 2018-08-15 DIAGNOSIS — N185 Chronic kidney disease, stage 5: Secondary | ICD-10-CM

## 2018-08-15 DIAGNOSIS — Z992 Dependence on renal dialysis: Secondary | ICD-10-CM | POA: Diagnosis not present

## 2018-08-15 DIAGNOSIS — Z79899 Other long term (current) drug therapy: Secondary | ICD-10-CM | POA: Diagnosis not present

## 2018-08-15 DIAGNOSIS — Z8673 Personal history of transient ischemic attack (TIA), and cerebral infarction without residual deficits: Secondary | ICD-10-CM | POA: Insufficient documentation

## 2018-08-15 DIAGNOSIS — Z7902 Long term (current) use of antithrombotics/antiplatelets: Secondary | ICD-10-CM | POA: Insufficient documentation

## 2018-08-15 HISTORY — PX: AV FISTULA PLACEMENT: SHX1204

## 2018-08-15 HISTORY — DX: End stage renal disease: N18.6

## 2018-08-15 LAB — GLUCOSE, CAPILLARY: Glucose-Capillary: 93 mg/dL (ref 70–99)

## 2018-08-15 LAB — POCT I-STAT 4, (NA,K, GLUC, HGB,HCT)
Glucose, Bld: 89 mg/dL (ref 70–99)
HCT: 40 % (ref 36.0–46.0)
Hemoglobin: 13.6 g/dL (ref 12.0–15.0)
Potassium: 5.1 mmol/L (ref 3.5–5.1)
Sodium: 137 mmol/L (ref 135–145)

## 2018-08-15 SURGERY — ARTERIOVENOUS (AV) FISTULA CREATION
Anesthesia: General | Site: Arm Lower | Laterality: Left

## 2018-08-15 MED ORDER — PROPOFOL 10 MG/ML IV BOLUS
INTRAVENOUS | Status: DC | PRN
  Administered 2018-08-15: 50 mg via INTRAVENOUS
  Administered 2018-08-15 (×2): 100 mg via INTRAVENOUS

## 2018-08-15 MED ORDER — LIDOCAINE HCL (CARDIAC) PF 100 MG/5ML IV SOSY
PREFILLED_SYRINGE | INTRAVENOUS | Status: DC | PRN
  Administered 2018-08-15: 60 mg via INTRATRACHEAL

## 2018-08-15 MED ORDER — DEXAMETHASONE SODIUM PHOSPHATE 10 MG/ML IJ SOLN
INTRAMUSCULAR | Status: AC
  Filled 2018-08-15: qty 1

## 2018-08-15 MED ORDER — PROTAMINE SULFATE 10 MG/ML IV SOLN
INTRAVENOUS | Status: AC
  Filled 2018-08-15: qty 5

## 2018-08-15 MED ORDER — HYDROMORPHONE HCL 1 MG/ML IJ SOLN
0.2500 mg | INTRAMUSCULAR | Status: DC | PRN
  Administered 2018-08-15: 0.5 mg via INTRAVENOUS

## 2018-08-15 MED ORDER — PROPOFOL 10 MG/ML IV BOLUS
INTRAVENOUS | Status: AC
  Filled 2018-08-15: qty 20

## 2018-08-15 MED ORDER — CEFAZOLIN SODIUM-DEXTROSE 2-4 GM/100ML-% IV SOLN
2.0000 g | INTRAVENOUS | Status: AC
  Administered 2018-08-15: 2 g via INTRAVENOUS

## 2018-08-15 MED ORDER — 0.9 % SODIUM CHLORIDE (POUR BTL) OPTIME
TOPICAL | Status: DC | PRN
  Administered 2018-08-15: 1000 mL

## 2018-08-15 MED ORDER — LIDOCAINE-EPINEPHRINE (PF) 1 %-1:200000 IJ SOLN
INTRAMUSCULAR | Status: AC
  Filled 2018-08-15: qty 30

## 2018-08-15 MED ORDER — CEFAZOLIN SODIUM-DEXTROSE 2-4 GM/100ML-% IV SOLN
INTRAVENOUS | Status: AC
  Filled 2018-08-15: qty 100

## 2018-08-15 MED ORDER — OXYCODONE HCL 5 MG/5ML PO SOLN: 5.0000 mg | Freq: Once | ORAL | Status: DC | PRN

## 2018-08-15 MED ORDER — SODIUM CHLORIDE 0.9 % IV SOLN
INTRAVENOUS | Status: AC
  Filled 2018-08-15: qty 1.2

## 2018-08-15 MED ORDER — HYDROCODONE-ACETAMINOPHEN 5-325 MG PO TABS: 1.0000 | ORAL_TABLET | Freq: Four times a day (QID) | ORAL | 0 refills | Status: DC | PRN

## 2018-08-15 MED ORDER — FENTANYL CITRATE (PF) 250 MCG/5ML IJ SOLN
INTRAMUSCULAR | Status: AC
  Filled 2018-08-15: qty 5

## 2018-08-15 MED ORDER — SODIUM CHLORIDE 0.9 % IV SOLN
INTRAVENOUS | Status: DC | PRN
  Administered 2018-08-15: 13:00:00 via INTRAVENOUS

## 2018-08-15 MED ORDER — SODIUM CHLORIDE 0.9 % IV SOLN: INTRAVENOUS | Status: DC

## 2018-08-15 MED ORDER — CARVEDILOL 25 MG PO TABS
25.0000 mg | ORAL_TABLET | ORAL | Status: AC
  Administered 2018-08-15: 13:00:00 25 mg via ORAL

## 2018-08-15 MED ORDER — DEXAMETHASONE SODIUM PHOSPHATE 4 MG/ML IJ SOLN
INTRAMUSCULAR | Status: DC | PRN
  Administered 2018-08-15: 5 mg via INTRAVENOUS

## 2018-08-15 MED ORDER — SODIUM CHLORIDE 0.9 % IV SOLN
INTRAVENOUS | Status: DC | PRN
  Administered 2018-08-15: 25 ug/min via INTRAVENOUS

## 2018-08-15 MED ORDER — CARVEDILOL 12.5 MG PO TABS
ORAL_TABLET | ORAL | Status: AC
  Filled 2018-08-15: qty 2

## 2018-08-15 MED ORDER — LIDOCAINE-EPINEPHRINE (PF) 1 %-1:200000 IJ SOLN
INTRAMUSCULAR | Status: DC | PRN
  Administered 2018-08-15: 30 mL

## 2018-08-15 MED ORDER — OXYCODONE HCL 5 MG PO TABS: 5.0000 mg | ORAL_TABLET | Freq: Once | ORAL | Status: DC | PRN

## 2018-08-15 MED ORDER — PROMETHAZINE HCL 25 MG/ML IJ SOLN
INTRAMUSCULAR | Status: AC
  Filled 2018-08-15: qty 1

## 2018-08-15 MED ORDER — HEPARIN SODIUM (PORCINE) 1000 UNIT/ML IJ SOLN
INTRAMUSCULAR | Status: AC
  Filled 2018-08-15: qty 1

## 2018-08-15 MED ORDER — FENTANYL CITRATE (PF) 100 MCG/2ML IJ SOLN
INTRAMUSCULAR | Status: DC | PRN
  Administered 2018-08-15 (×2): 50 ug via INTRAVENOUS

## 2018-08-15 MED ORDER — MIDAZOLAM HCL 2 MG/2ML IJ SOLN
INTRAMUSCULAR | Status: AC
  Filled 2018-08-15: qty 2

## 2018-08-15 MED ORDER — PROPOFOL 500 MG/50ML IV EMUL: INTRAVENOUS | Status: DC | PRN

## 2018-08-15 MED ORDER — HEMOSTATIC AGENTS (NO CHARGE) OPTIME
TOPICAL | Status: DC | PRN
  Administered 2018-08-15: 1 via TOPICAL

## 2018-08-15 MED ORDER — ONDANSETRON HCL 4 MG/2ML IJ SOLN
INTRAMUSCULAR | Status: DC | PRN
  Administered 2018-08-15: 4 mg via INTRAVENOUS

## 2018-08-15 MED ORDER — SODIUM CHLORIDE 0.9 % IV SOLN
INTRAVENOUS | Status: DC
  Administered 2018-08-15: 13:00:00 via INTRAVENOUS

## 2018-08-15 MED ORDER — MIDAZOLAM HCL 5 MG/5ML IJ SOLN
INTRAMUSCULAR | Status: DC | PRN
  Administered 2018-08-15: 2 mg via INTRAVENOUS

## 2018-08-15 MED ORDER — SODIUM CHLORIDE 0.9 % IV SOLN
INTRAVENOUS | Status: DC | PRN
  Administered 2018-08-15: 500 mL

## 2018-08-15 MED ORDER — EPHEDRINE SULFATE 50 MG/ML IJ SOLN
INTRAMUSCULAR | Status: DC | PRN
  Administered 2018-08-15 (×2): 10 mg via INTRAVENOUS
  Administered 2018-08-15: 15 mg via INTRAVENOUS

## 2018-08-15 MED ORDER — PROMETHAZINE HCL 25 MG/ML IJ SOLN
6.2500 mg | INTRAMUSCULAR | Status: DC | PRN
  Administered 2018-08-15: 6.25 mg via INTRAVENOUS

## 2018-08-15 MED ORDER — HYDROMORPHONE HCL 1 MG/ML IJ SOLN
INTRAMUSCULAR | Status: AC
  Filled 2018-08-15: qty 1

## 2018-08-15 MED ORDER — PHENYLEPHRINE HCL (PRESSORS) 10 MG/ML IV SOLN: INTRAVENOUS | Status: DC | PRN

## 2018-08-15 SURGICAL SUPPLY — 31 items
ARMBAND PINK RESTRICT EXTREMIT (MISCELLANEOUS) ×4 IMPLANT
CANISTER SUCT 3000ML PPV (MISCELLANEOUS) ×2 IMPLANT
CLIP VESOCCLUDE MED 6/CT (CLIP) ×2 IMPLANT
CLIP VESOCCLUDE SM WIDE 6/CT (CLIP) ×2 IMPLANT
COVER PROBE W GEL 5X96 (DRAPES) ×2 IMPLANT
COVER WAND RF STERILE (DRAPES) ×2 IMPLANT
DERMABOND ADVANCED (GAUZE/BANDAGES/DRESSINGS) ×1
DERMABOND ADVANCED .7 DNX12 (GAUZE/BANDAGES/DRESSINGS) ×1 IMPLANT
ELECT REM PT RETURN 9FT ADLT (ELECTROSURGICAL) ×2
ELECTRODE REM PT RTRN 9FT ADLT (ELECTROSURGICAL) ×1 IMPLANT
GLOVE BIOGEL PI IND STRL 7.5 (GLOVE) ×1 IMPLANT
GLOVE BIOGEL PI INDICATOR 7.5 (GLOVE) ×1
GLOVE SURG SS PI 7.5 STRL IVOR (GLOVE) ×2 IMPLANT
GOWN STRL REUS W/ TWL LRG LVL3 (GOWN DISPOSABLE) ×2 IMPLANT
GOWN STRL REUS W/ TWL XL LVL3 (GOWN DISPOSABLE) ×1 IMPLANT
GOWN STRL REUS W/TWL LRG LVL3 (GOWN DISPOSABLE) ×2
GOWN STRL REUS W/TWL XL LVL3 (GOWN DISPOSABLE) ×1
HEMOSTAT SNOW SURGICEL 2X4 (HEMOSTASIS) ×2 IMPLANT
KIT BASIN OR (CUSTOM PROCEDURE TRAY) ×2 IMPLANT
KIT TURNOVER KIT B (KITS) ×2 IMPLANT
NS IRRIG 1000ML POUR BTL (IV SOLUTION) ×2 IMPLANT
PACK CV ACCESS (CUSTOM PROCEDURE TRAY) ×2 IMPLANT
PAD ARMBOARD 7.5X6 YLW CONV (MISCELLANEOUS) ×4 IMPLANT
SUT PROLENE 6 0 BV (SUTURE) ×2 IMPLANT
SUT PROLENE 6 0 CC (SUTURE) ×2 IMPLANT
SUT VIC AB 3-0 SH 27 (SUTURE) ×1
SUT VIC AB 3-0 SH 27X BRD (SUTURE) ×1 IMPLANT
SUT VICRYL 4-0 PS2 18IN ABS (SUTURE) ×2 IMPLANT
TOWEL GREEN STERILE (TOWEL DISPOSABLE) ×2 IMPLANT
UNDERPAD 30X30 (UNDERPADS AND DIAPERS) ×2 IMPLANT
WATER STERILE IRR 1000ML POUR (IV SOLUTION) ×2 IMPLANT

## 2018-08-15 NOTE — Op Note (Signed)
    Patient name: Erin Dean MRN: [id number] DOB: March 30, 1966 Sex: female  08/15/2018 Pre-operative Diagnosis: ESRD Post-operative diagnosis:  Same Surgeon:  [name] Assistants:  Arlee Muslim Procedure:   Left brachiocephalic fistula Anesthesia: General Blood Loss: Minimal Specimens: None  Findings: 3 mm artery, 4 mm vein  Indications: The patient is in need of new dialysis access.  Preoperative imaging revealed she had an adequate cephalic vein.  Procedure:  The patient was identified in the holding area and taken to [address]  The patient was then placed supine on the table. general anesthesia was administered.  The patient was prepped and draped in the usual sterile fashion.  A time out was called and antibiotics were administered.  Ultrasound was used to evaluate the cephalic vein in the upper arm.  This was of adequate caliber for fistula creation.  A transverse incision was then made at the antecubital crease.  I first dissected out the cephalic vein.  This was a healthy appearing greater than 4 mm vein.  Branches were ligated between silk ties.  The vein was marked for orientation.  Next, I dissected out the brachial artery.  This was a disease-free 3 mm artery.  Once adequate exposure was obtained, the cephalic vein was ligated distally.  It distended nicely with heparinized saline to about 5 mm.  The brachial artery was then occluded with vascular clamps.  A #11 blade was used to make an arteriotomy which extended longitudinally Potts scissors.  The vein was cut the presence of the arteriotomy and a running anastomosis was created in a end-to-side fashion.  Just prior to completion the appropriate flushing maneuvers were performed and the anastomosis was completed.  There was an excellent thrill within the fistula.  There were brisk radial and ulnar Doppler signals at the wrist.  I inspected the course of the vein to make sure there were no kinks or further branches.  Hemostasis  was achieved.  The wound was irrigated.  The incision was closed with 2 layers of 3-0 Vicryl followed by Dermabond.  There were no immediate complications.   Disposition: To PACU stable   V. [name], M.D., [facility] [place] Office: [phone] Pager:  [phone]

## 2018-08-15 NOTE — Interval H&P Note (Signed)
History and Physical Interval Note:  08/15/2018 11:35 AM  Erin Dean  has presented today for surgery, with the diagnosis of END STAGE KIDNEY DISEASE.  The various methods of treatment have been discussed with the patient and family. After consideration of risks, benefits and other options for treatment, the patient has consented to  Procedure(s): ARTERIOVENOUS (AV) FISTULA CREATION (Left) as a surgical intervention.  The patient's history has been reviewed, patient examined, no change in status, stable for surgery.  I have reviewed the patient's chart and labs.  Questions were answered to the patient's satisfaction.     [name]

## 2018-08-15 NOTE — Anesthesia Procedure Notes (Signed)
Procedure Name: LMA Insertion Date/Time: 08/15/2018 2:45 PM Performed by: Oletta Lamas, CRNA Pre-anesthesia Checklist: Patient identified, Emergency Drugs available, Suction available and Patient being monitored Patient Re-evaluated:Patient Re-evaluated prior to induction Oxygen Delivery Method: Circle System Utilized Preoxygenation: Pre-oxygenation with 100% oxygen Induction Type: IV induction Ventilation: Mask ventilation without difficulty LMA: LMA inserted LMA Size: 4.0 Number of attempts: 2 Placement Confirmation: positive ETCO2 Tube secured with: Tape Dental Injury: Teeth and Oropharynx as per pre-operative assessment

## 2018-08-15 NOTE — Transfer of Care (Signed)
Immediate Anesthesia Transfer of Care Note  Patient: Erin Dean  Procedure(s) Performed: ARTERIOVENOUS (AV) FISTULA CREATION (Left Arm Lower)  Patient Location: PACU  Anesthesia Type:General  Level of Consciousness: awake, alert , oriented and patient cooperative  Airway & Oxygen Therapy: Patient Spontanous Breathing  Post-op Assessment: Report given to RN and Post -op Vital signs reviewed and stable  Post vital signs: Reviewed and stable  Last Vitals:  Vitals Value Taken Time  BP 174/79 08/15/2018  4:00 PM  Temp    Pulse 66 08/15/2018  4:06 PM  Resp 10 08/15/2018  4:06 PM  SpO2 99 % 08/15/2018  4:06 PM  Vitals shown include unvalidated device data.  Last Pain:  Vitals:   08/15/18 1226  TempSrc:   PainSc: 0-No pain         Complications: No apparent anesthesia complications   C/o nausea.  PACU nurse to treat.

## 2018-08-15 NOTE — Discharge Instructions (Signed)
° °  [facility] [place] ° °Discharge Instructions ° °AV Fistula or Graft Surgery for Dialysis Access ° °Please refer to the following instructions for your post-procedure care. Your surgeon or physician assistant will discuss any changes with you. ° °Activity ° °You may drive the day following your surgery, if you are comfortable and no longer taking prescription pain medication. Resume full activity as the soreness in your incision resolves. ° °Bathing/Showering ° °You may shower after you go home. Keep your incision dry for 48 hours. Do not soak in a bathtub, hot tub, or swim until the incision heals completely. You may not shower if you have a hemodialysis catheter. ° °Incision Care ° °Clean your incision with mild soap and water after 48 hours. Pat the area dry with a clean towel. You do not need a bandage unless otherwise instructed. Do not apply any ointments or creams to your incision. You may have skin glue on your incision. Do not peel it off. It will come off on its own in about one week. Your arm may swell a bit after surgery. To reduce swelling use pillows to elevate your arm so it is above your heart. Your doctor will tell you if you need to lightly wrap your arm with an ACE bandage. ° °Diet ° °Resume your normal diet. There are not special food restrictions following this procedure. In order to heal from your surgery, it is CRITICAL to get adequate nutrition. Your body requires vitamins, minerals, and protein. Vegetables are the best source of vitamins and minerals. Vegetables also provide the perfect balance of protein. Processed food has little nutritional value, so try to avoid this. ° °Medications ° °Resume taking all of your medications. If your incision is causing pain, you may take over-the counter pain relievers such as acetaminophen (Tylenol). If you were prescribed a stronger pain medication, please be aware these medications can cause nausea and constipation. Prevent  nausea by taking the medication with a snack or meal. Avoid constipation by drinking plenty of fluids and eating foods with high amount of fiber, such as fruits, vegetables, and grains. Do not take Tylenol if you are taking prescription pain medications. ° ° ° ° °Follow up °Your surgeon may want to see you in the office following your access surgery. If so, this will be arranged at the time of your surgery. ° °Please call us immediately for any of the following conditions: ° °Increased pain, redness, drainage (pus) from your incision site °Fever of 101 degrees or higher °Severe or worsening pain at your incision site °Hand pain or numbness. ° °Reduce your risk of vascular disease: ° °Stop smoking. If you would like help, call [facility] at 1-800-QUIT-NOW (1-800-784-8669) or [facility] at 336-586-4000 ° °Manage your cholesterol °Maintain a desired weight °Control your diabetes °Keep your blood pressure down ° °Dialysis ° °It will take several weeks to several months for your new dialysis access to be ready for use. Your surgeon will determine when it is OK to use it. Your nephrologist will continue to direct your dialysis. You can continue to use your Permcath until your new access is ready for use. ° °If you have any questions, please call the office at [phone]. ° °

## 2018-08-15 NOTE — Anesthesia Preprocedure Evaluation (Signed)
Anesthesia Evaluation  Patient identified by MRN, date of birth, ID band Patient awake    Reviewed: Allergy & Precautions, NPO status , Patient's Chart, lab work & pertinent test results, reviewed documented beta blocker date and time   Airway Mallampati: III  TM Distance: >3 FB Neck ROM: Full    Dental no notable dental hx. (+) Loose,    Pulmonary neg pulmonary ROS,    Pulmonary exam normal breath sounds clear to auscultation       Cardiovascular hypertension, Pt. on medications and Pt. on home beta blockers Normal cardiovascular exam Rhythm:Regular Rate:Normal  ECG: SB, Rate 53  ECHO: LV EF: 55% -   60%  Loop recorder  Sees cardiologist Gwenlyn Found)   Neuro/Psych Anxiety CVA, No Residual Symptoms    GI/Hepatic negative GI ROS, Neg liver ROS,   Endo/Other    Renal/GU ESRFRenal disease     Musculoskeletal negative musculoskeletal ROS (+)   Abdominal (+) + obese,   Peds  Hematology  (+) anemia , HLD   Anesthesia Other Findings chronic kidney disease  Reproductive/Obstetrics hcg negative                             Anesthesia Physical  Anesthesia Plan  ASA: III  Anesthesia Plan: General   Post-op Pain Management:    Induction: Intravenous  PONV Risk Score and Plan: 3 and Ondansetron, Dexamethasone, Midazolam and Treatment may vary due to age or medical condition  Airway Management Planned: LMA  Additional Equipment:   Intra-op Plan:   Post-operative Plan:   Informed Consent: I have reviewed the patients History and Physical, chart, labs and discussed the procedure including the risks, benefits and alternatives for the proposed anesthesia with the patient or authorized representative who has indicated his/her understanding and acceptance.     Dental advisory given  Plan Discussed with: CRNA, Anesthesiologist and Surgeon  Anesthesia Plan Comments:         Anesthesia  Quick Evaluation

## 2018-08-18 ENCOUNTER — Encounter (HOSPITAL_COMMUNITY): Payer: Self-pay | Admitting: Surgery

## 2018-08-22 ENCOUNTER — Telehealth: Payer: Self-pay | Admitting: Surgery

## 2018-08-22 NOTE — Telephone Encounter (Signed)
sch appt spk to pt mld ltr 09/29/2018 10am Dialysis Duplex 11am p/o MD

## 2018-08-22 NOTE — Telephone Encounter (Signed)
-----   Message from Mena Goes, RN sent at 08/15/2018  4:21 PM EDT ----- Regarding: 6 weeks  ----- Message ----- From: Serafina Mitchell, MD Sent: 08/15/2018   3:52 PM EDT To: [facility] Charge Pool  08/15/2018:  Surgeon:  [name] Assistants:  Arlee Muslim Procedure:   Left brachiocephalic fistula   Follow-up 6 weeks with duplex of fistula to see me

## 2018-09-02 NOTE — Anesthesia Postprocedure Evaluation (Signed)
Anesthesia Post Note  Patient: Erin Dean  Procedure(s) Performed: ARTERIOVENOUS (AV) FISTULA CREATION (Left Arm Lower)     Patient location during evaluation: PACU Anesthesia Type: General Level of consciousness: awake and alert Pain management: pain level controlled Vital Signs Assessment: post-procedure vital signs reviewed and stable Respiratory status: spontaneous breathing, nonlabored ventilation and respiratory function stable Cardiovascular status: blood pressure returned to baseline and stable Postop Assessment: no apparent nausea or vomiting Anesthetic complications: no    Last Vitals:  Vitals:   08/15/18 1630 08/15/18 1645  BP: (!) 144/74 (!) 145/78  Pulse: 74 75  Resp: 15 15  Temp:  (!) 36.3 C  SpO2: 95% 96%    Last Pain:  Vitals:   08/15/18 1645  TempSrc:   PainSc: 0-No pain                 Lynda Rainwater

## 2018-09-17 ENCOUNTER — Other Ambulatory Visit: Payer: Self-pay

## 2018-09-17 DIAGNOSIS — Z992 Dependence on renal dialysis: Secondary | ICD-10-CM

## 2018-09-17 DIAGNOSIS — N186 End stage renal disease: Secondary | ICD-10-CM

## 2018-09-26 ENCOUNTER — Telehealth (HOSPITAL_COMMUNITY): Payer: Self-pay | Admitting: *Deleted

## 2018-09-26 NOTE — Telephone Encounter (Signed)
The above patient or their representative was contacted and gave the following answers to these questions:         Do you have any of the following symptoms? no  Fever                    Cough                   Shortness of breath  Do  you have any of the following other symptoms? no   muscle pain         vomiting,        diarrhea        rash         weakness        red eye        abdominal pain         bruising          bruising or bleeding              joint pain           severe headache    Have you been in contact with someone who was or has been sick in the past 2 weeks? no Yes                 Unsure                         Unable to assess   Does the person that you were in contact with have any of the following symptoms?   Cough         shortness of breath           muscle pain         vomiting,            diarrhea            rash            weakness           fever            red eye           abdominal pain           bruising  or  bleeding                joint pain                severe headache               Have you  or someone you have been in contact with traveled internationally in th last month?       no  If yes, which countries?   Have you  or someone you have been in contact with traveled outside [place] in th last month?         If yes, which state and city? no   COMMENTS OR ACTION PLAN FOR THIS PATIENT:

## 2018-09-29 ENCOUNTER — Other Ambulatory Visit: Payer: Self-pay | Admitting: *Deleted

## 2018-09-29 ENCOUNTER — Encounter: Payer: Self-pay | Admitting: *Deleted

## 2018-09-29 ENCOUNTER — Other Ambulatory Visit: Payer: Self-pay

## 2018-09-29 ENCOUNTER — Ambulatory Visit (INDEPENDENT_AMBULATORY_CARE_PROVIDER_SITE_OTHER): Payer: Self-pay | Admitting: Surgery

## 2018-09-29 ENCOUNTER — Ambulatory Visit (HOSPITAL_COMMUNITY)
Admission: RE | Admit: 2018-09-29 | Discharge: 2018-09-29 | Disposition: A | Discharge status: Home / Self Care | Payer: Medicare Other | Source: Ambulatory Visit | Attending: Surgery | Admitting: Surgery

## 2018-09-29 VITALS — BP 192/91 | HR 65 | Temp 97.7°F | Resp 18 | Ht 68.0 in | Wt 257.0 lb

## 2018-09-29 DIAGNOSIS — Z992 Dependence on renal dialysis: Secondary | ICD-10-CM | POA: Insufficient documentation

## 2018-09-29 DIAGNOSIS — N186 End stage renal disease: Secondary | ICD-10-CM

## 2018-09-29 NOTE — Progress Notes (Signed)
Spoke to [employer] at [address] [facility]. HD will need to be scheduled to get patient out and to [facility] for nasal swab on 10/07/2018 for 10/08/2018 surgery.

## 2018-09-29 NOTE — H&P (View-Only) (Signed)
Patient name: Erin Dean MRN: [id number] DOB: 1965-07-07 Sex: female  REASON FOR VISIT:     post op  HISTORY OF PRESENT ILLNESS:   Erin Dean is a 53 y.o. female who is s/p left BCF on 08-15-2018  CURRENT MEDICATIONS:    Current Outpatient Medications  Medication Sig Dispense Refill  . acetaminophen (TYLENOL) 500 MG tablet Take 2 tablets (1,000 mg total) by mouth daily as needed for moderate pain or headache. 30 tablet 0  . ALPRAZolam (XANAX) 0.5 MG tablet TK 1/2 TO 1 T PO BID    . amLODipine (NORVASC) 10 MG tablet Take 1 tablet (10 mg total) by mouth daily. (Patient taking differently: Take 10 mg by mouth every evening. ) 180 tablet 3  . aspirin EC 81 MG tablet Take 81 mg by mouth every evening.    Marland Kitchen atorvastatin (LIPITOR) 40 MG tablet Take 1 tablet (40 mg total) by mouth daily at 6 PM. 30 tablet 1  . calcitRIOL (ROCALTROL) 0.25 MCG capsule Take 1 capsule (0.25 mcg total) by mouth daily. 30 capsule 2  . carvedilol (COREG) 25 MG tablet Take 1 tablet (25 mg total) by mouth 2 (two) times daily with a meal. 60 tablet 2  . cloNIDine (CATAPRES) 0.1 MG tablet Take 1 tablet (0.1 mg total) by mouth 3 (three) times daily. 60 tablet 11  . clopidogrel (PLAVIX) 75 MG tablet Take 1 tablet (75 mg total) by mouth daily. (Patient taking differently: Take 75 mg by mouth every evening. ) 30 tablet 2  . cyanocobalamin 1000 MCG tablet Take 1 tablet (1,000 mcg total) by mouth daily. 30 tablet 2  . escitalopram (LEXAPRO) 10 MG tablet Take 1 tablet (10 mg total) by mouth daily. (Patient taking differently: Take 10 mg by mouth every evening. ) 30 tablet 2  . ferrous sulfate 325 (65 FE) MG tablet Take 1 tablet (325 mg total) by mouth 3 (three) times daily with meals. 90 tablet 2  . furosemide (LASIX) 40 MG tablet Take 1 tablet (40 mg total) by mouth See admin instructions. Take 40 mg tablet with 80 mg to equal 120 twice daily then take a third 40 mg dose by itself 60  tablet 2  . furosemide (LASIX) 80 MG tablet Take 1 tablet (80 mg total) by mouth 2 (two) times daily. Take with 40 mg to equal 120 mg twice daily 60 tablet 2  . hydrALAZINE (APRESOLINE) 100 MG tablet Take 1 tablet (100 mg total) by mouth 3 (three) times daily. 30 tablet 2  . multivitamin (RENA-VIT) TABS tablet Take 1 tablet by mouth every evening.     . pantoprazole (PROTONIX) 40 MG tablet Take 1 tablet (40 mg total) by mouth daily. (Patient taking differently: Take 40 mg by mouth every evening. ) 30 tablet 2  . RENAGEL 800 MG tablet TAKE 2 TABLETS BY MOUTH THREE TIMES DAILY WITH MEALS     No current facility-administered medications for this visit.     REVIEW OF SYSTEMS:   [X]  denotes positive finding, [ ]  denotes negative finding Cardiac  Comments:  Chest pain or chest pressure:    Shortness of breath upon exertion:    Short of breath when lying flat:    Irregular heart rhythm:    Constitutional    Fever or chills:      PHYSICAL EXAM:   Vitals:   09/29/18 1052  BP: (!) 192/91  Pulse: 65  Resp: 18  Temp: 97.7 F (36.5 C)  SpO2:  96%  Weight: 116.6 kg  Height: 5\' 8"  (1.727 m)    GENERAL: The patient is a well-nourished female, in no acute distress. The vital signs are documented above. CARDIOVASCULAR: There is a regular rate and rhythm. PULMONARY: Non-labored respirations Palpable thrill in left avf  STUDIES:    +------------+----------+-------------+----------+-----------------------+ OUTFLOW VEINPSV (cm/s)Diameter (cm)Depth (cm)       Describe         +------------+----------+-------------+----------+-----------------------+ Shoulder       265        0.57        2.41                           +------------+----------+-------------+----------+-----------------------+ Prox UA        237        0.72        1.42                           +------------+----------+-------------+----------+-----------------------+ Mid UA         215        0.80         0.58                           +------------+----------+-------------+----------+-----------------------+ Dist UA        547        0.50        0.91   competing branch 2.4 cm +------------+----------+-------------+----------+-----------------------+   MEDICAL ISSUES:   plan for AVF elevation on left arm next Wednesday Discussed being evaluated for renal transplant  Leia Alf, MD, FACS [facility] [phone] Pager [phone]

## 2018-09-29 NOTE — Progress Notes (Signed)
Patient name: Erin Dean MRN: [id number] DOB: [birth year] Sex: female  REASON FOR VISIT:     post op  HISTORY OF PRESENT ILLNESS:   Erin Dean is a 53 y.o. female who is s/p left BCF on 08-15-2018  CURRENT MEDICATIONS:    Current Outpatient Medications  Medication Sig Dispense Refill  . acetaminophen (TYLENOL) 500 MG tablet Take 2 tablets (1,000 mg total) by mouth daily as needed for moderate pain or headache. 30 tablet 0  . ALPRAZolam (XANAX) 0.5 MG tablet TK 1/2 TO 1 T PO BID    . amLODipine (NORVASC) 10 MG tablet Take 1 tablet (10 mg total) by mouth daily. (Patient taking differently: Take 10 mg by mouth every evening. ) 180 tablet 3  . aspirin EC 81 MG tablet Take 81 mg by mouth every evening.    Marland Kitchen atorvastatin (LIPITOR) 40 MG tablet Take 1 tablet (40 mg total) by mouth daily at 6 PM. 30 tablet 1  . calcitRIOL (ROCALTROL) 0.25 MCG capsule Take 1 capsule (0.25 mcg total) by mouth daily. 30 capsule 2  . carvedilol (COREG) 25 MG tablet Take 1 tablet (25 mg total) by mouth 2 (two) times daily with a meal. 60 tablet 2  . cloNIDine (CATAPRES) 0.1 MG tablet Take 1 tablet (0.1 mg total) by mouth 3 (three) times daily. 60 tablet 11  . clopidogrel (PLAVIX) 75 MG tablet Take 1 tablet (75 mg total) by mouth daily. (Patient taking differently: Take 75 mg by mouth every evening. ) 30 tablet 2  . cyanocobalamin 1000 MCG tablet Take 1 tablet (1,000 mcg total) by mouth daily. 30 tablet 2  . escitalopram (LEXAPRO) 10 MG tablet Take 1 tablet (10 mg total) by mouth daily. (Patient taking differently: Take 10 mg by mouth every evening. ) 30 tablet 2  . ferrous sulfate 325 (65 FE) MG tablet Take 1 tablet (325 mg total) by mouth 3 (three) times daily with meals. 90 tablet 2  . furosemide (LASIX) 40 MG tablet Take 1 tablet (40 mg total) by mouth See admin instructions. Take 40 mg tablet with 80 mg to equal 120 twice daily then take a third 40 mg dose by itself 60  tablet 2  . furosemide (LASIX) 80 MG tablet Take 1 tablet (80 mg total) by mouth 2 (two) times daily. Take with 40 mg to equal 120 mg twice daily 60 tablet 2  . hydrALAZINE (APRESOLINE) 100 MG tablet Take 1 tablet (100 mg total) by mouth 3 (three) times daily. 30 tablet 2  . multivitamin (RENA-VIT) TABS tablet Take 1 tablet by mouth every evening.     . pantoprazole (PROTONIX) 40 MG tablet Take 1 tablet (40 mg total) by mouth daily. (Patient taking differently: Take 40 mg by mouth every evening. ) 30 tablet 2  . RENAGEL 800 MG tablet TAKE 2 TABLETS BY MOUTH THREE TIMES DAILY WITH MEALS     No current facility-administered medications for this visit.     REVIEW OF SYSTEMS:   [X]  denotes positive finding, [ ]  denotes negative finding Cardiac  Comments:  Chest pain or chest pressure:    Shortness of breath upon exertion:    Short of breath when lying flat:    Irregular heart rhythm:    Constitutional    Fever or chills:      PHYSICAL EXAM:   Vitals:   09/29/18 1052  BP: (!) 192/91  Pulse: 65  Resp: 18  Temp: 97.7 F (36.5 C)  SpO2:  96%  Weight: 116.6 kg  Height: 5\' 8"  (1.727 m)    GENERAL: The patient is a well-nourished female, in no acute distress. The vital signs are documented above. CARDIOVASCULAR: There is a regular rate and rhythm. PULMONARY: Non-labored respirations Palpable thrill in left avf  STUDIES:    +------------+----------+-------------+----------+-----------------------+ OUTFLOW VEINPSV (cm/s)Diameter (cm)Depth (cm)       Describe         +------------+----------+-------------+----------+-----------------------+ Shoulder       265        0.57        2.41                           +------------+----------+-------------+----------+-----------------------+ Prox UA        237        0.72        1.42                           +------------+----------+-------------+----------+-----------------------+ Mid UA         215        0.80         0.58                           +------------+----------+-------------+----------+-----------------------+ Dist UA        547        0.50        0.91   competing branch 2.4 cm +------------+----------+-------------+----------+-----------------------+   MEDICAL ISSUES:   plan for AVF elevation on left arm next Wednesday Discussed being evaluated for renal transplant  Leia Alf, MD, FACS [facility] [phone] Pager [phone]

## 2018-10-06 ENCOUNTER — Other Ambulatory Visit (HOSPITAL_COMMUNITY)
Admission: RE | Admit: 2018-10-06 | Discharge: 2018-10-06 | Disposition: A | Discharge status: Home / Self Care | Payer: Medicare Other | Source: Ambulatory Visit | Attending: Surgery | Admitting: Surgery

## 2018-10-06 DIAGNOSIS — Z01812 Encounter for preprocedural laboratory examination: Secondary | ICD-10-CM | POA: Insufficient documentation

## 2018-10-06 DIAGNOSIS — Z1159 Encounter for screening for other viral diseases: Secondary | ICD-10-CM | POA: Diagnosis not present

## 2018-10-06 LAB — SARS CORONAVIRUS 2 (TAT 6-24 HRS): SARS Coronavirus 2: NEGATIVE

## 2018-10-07 ENCOUNTER — Inpatient Hospital Stay (HOSPITAL_COMMUNITY)
Admission: RE | Admit: 2018-10-07 | Discharge: 2018-10-07 | Disposition: A | Discharge status: Home / Self Care | Payer: Medicare Other | Source: Ambulatory Visit

## 2018-10-07 ENCOUNTER — Encounter (HOSPITAL_COMMUNITY): Payer: Self-pay | Admitting: *Deleted

## 2018-10-07 ENCOUNTER — Other Ambulatory Visit: Payer: Self-pay

## 2018-10-07 MED ORDER — DEXTROSE 5 % IV SOLN
3.0000 g | INTRAVENOUS | Status: AC
  Administered 2018-10-08: 3 g via INTRAVENOUS
  Filled 2018-10-07: qty 3

## 2018-10-07 NOTE — Anesthesia Preprocedure Evaluation (Addendum)
Anesthesia Evaluation  Patient identified by MRN, date of birth, ID band Patient awake    Reviewed: Allergy & Precautions, NPO status , Patient's Chart, lab work & pertinent test results, reviewed documented beta blocker date and time   Airway Mallampati: II  TM Distance: >3 FB Neck ROM: Full    Dental  (+) Teeth Intact, Dental Advisory Given   Pulmonary neg pulmonary ROS,    breath sounds clear to auscultation       Cardiovascular hypertension, Pt. on medications and Pt. on home beta blockers + dysrhythmias Supra Ventricular Tachycardia  Rhythm:Regular Rate:Normal     Neuro/Psych CVA    GI/Hepatic negative GI ROS, Neg liver ROS,   Endo/Other  negative endocrine ROS  Renal/GU ESRF and DialysisRenal disease     Musculoskeletal   Abdominal   Peds  Hematology negative hematology ROS (+)   Anesthesia Other Findings   Reproductive/Obstetrics                           Lab Results  Component Value Date   WBC 8.8 04/23/2018   HGB 11.6 (L) 10/08/2018   HCT 34.0 (L) 10/08/2018   MCV 95.7 04/23/2018   PLT 241 04/23/2018   Lab Results  Component Value Date   CREATININE 6.21 (H) 03/11/2018   BUN 67 (H) 03/11/2018   NA 140 10/08/2018   K 3.8 10/08/2018   CL 109 03/11/2018   CO2 18 (L) 03/11/2018    Anesthesia Physical Anesthesia Plan  ASA: III  Anesthesia Plan: General   Post-op Pain Management:    Induction: Intravenous  PONV Risk Score and Plan: 3 and Ondansetron, Dexamethasone and Midazolam  Airway Management Planned: LMA  Additional Equipment: None  Intra-op Plan:   Post-operative Plan: Extubation in OR  Informed Consent: I have reviewed the patients History and Physical, chart, labs and discussed the procedure including the risks, benefits and alternatives for the proposed anesthesia with the patient or authorized representative who has indicated his/her understanding and  acceptance.     Dental advisory given  Plan Discussed with: Anesthesiologist, CRNA and Surgeon  Anesthesia Plan Comments:       Anesthesia Quick Evaluation

## 2018-10-07 NOTE — Progress Notes (Addendum)
Pre-procedure instructions given to patient Patient verbalized understanding Patient stopped plavix 3 days ago per instructions  Spoke with Jeneen Rinks, patient has a hx of SVT with no cardiology follow-up but the patient has had several surgeries in the last year

## 2018-10-08 ENCOUNTER — Encounter (HOSPITAL_COMMUNITY): Payer: Self-pay | Admitting: Certified Registered"

## 2018-10-08 ENCOUNTER — Encounter (HOSPITAL_COMMUNITY)
Admission: RE | Disposition: A | Discharge status: Home / Self Care | Payer: Self-pay | Source: Home / Self Care | Attending: Surgery

## 2018-10-08 ENCOUNTER — Ambulatory Visit (HOSPITAL_COMMUNITY): Payer: Medicare Other | Admitting: Physician Assistant

## 2018-10-08 ENCOUNTER — Ambulatory Visit (HOSPITAL_COMMUNITY)
Admission: RE | Admit: 2018-10-08 | Discharge: 2018-10-08 | Disposition: A | Discharge status: Home / Self Care | Payer: Medicare Other | Attending: Surgery | Admitting: Surgery

## 2018-10-08 DIAGNOSIS — I472 Ventricular tachycardia: Secondary | ICD-10-CM | POA: Diagnosis not present

## 2018-10-08 DIAGNOSIS — Z79899 Other long term (current) drug therapy: Secondary | ICD-10-CM | POA: Insufficient documentation

## 2018-10-08 DIAGNOSIS — Z7902 Long term (current) use of antithrombotics/antiplatelets: Secondary | ICD-10-CM | POA: Diagnosis not present

## 2018-10-08 DIAGNOSIS — I120 Hypertensive chronic kidney disease with stage 5 chronic kidney disease or end stage renal disease: Secondary | ICD-10-CM | POA: Insufficient documentation

## 2018-10-08 DIAGNOSIS — N186 End stage renal disease: Secondary | ICD-10-CM | POA: Insufficient documentation

## 2018-10-08 DIAGNOSIS — Z8673 Personal history of transient ischemic attack (TIA), and cerebral infarction without residual deficits: Secondary | ICD-10-CM | POA: Diagnosis not present

## 2018-10-08 DIAGNOSIS — Z992 Dependence on renal dialysis: Secondary | ICD-10-CM | POA: Insufficient documentation

## 2018-10-08 DIAGNOSIS — Z7982 Long term (current) use of aspirin: Secondary | ICD-10-CM | POA: Diagnosis not present

## 2018-10-08 DIAGNOSIS — X58XXXA Exposure to other specified factors, initial encounter: Secondary | ICD-10-CM | POA: Diagnosis not present

## 2018-10-08 DIAGNOSIS — T82898A Other specified complication of vascular prosthetic devices, implants and grafts, initial encounter: Secondary | ICD-10-CM | POA: Diagnosis present

## 2018-10-08 DIAGNOSIS — N185 Chronic kidney disease, stage 5: Secondary | ICD-10-CM

## 2018-10-08 HISTORY — PX: FISTULA SUPERFICIALIZATION: SHX6341

## 2018-10-08 LAB — POCT I-STAT 4, (NA,K, GLUC, HGB,HCT)
Glucose, Bld: 100 mg/dL — ABNORMAL HIGH (ref 70–99)
HCT: 34 % — ABNORMAL LOW (ref 36.0–46.0)
Hemoglobin: 11.6 g/dL — ABNORMAL LOW (ref 12.0–15.0)
Potassium: 3.8 mmol/L (ref 3.5–5.1)
Sodium: 140 mmol/L (ref 135–145)

## 2018-10-08 SURGERY — FISTULA SUPERFICIALIZATION
Anesthesia: General | Site: Arm Upper | Laterality: Left

## 2018-10-08 MED ORDER — HEMOSTATIC AGENTS (NO CHARGE) OPTIME
TOPICAL | Status: DC | PRN
  Administered 2018-10-08: 1 via TOPICAL

## 2018-10-08 MED ORDER — SODIUM CHLORIDE 0.9 % IV SOLN
INTRAVENOUS | Status: DC | PRN
  Administered 2018-10-08: 500 mL

## 2018-10-08 MED ORDER — MIDAZOLAM HCL 2 MG/2ML IJ SOLN
INTRAMUSCULAR | Status: AC
  Filled 2018-10-08: qty 2

## 2018-10-08 MED ORDER — SODIUM CHLORIDE 0.9 % IV SOLN
INTRAVENOUS | Status: AC
  Filled 2018-10-08: qty 1.2

## 2018-10-08 MED ORDER — LIDOCAINE 2% (20 MG/ML) 5 ML SYRINGE
INTRAMUSCULAR | Status: AC
  Filled 2018-10-08: qty 5

## 2018-10-08 MED ORDER — ONDANSETRON HCL 4 MG/2ML IJ SOLN
INTRAMUSCULAR | Status: AC
  Filled 2018-10-08: qty 2

## 2018-10-08 MED ORDER — METOPROLOL TARTRATE 5 MG/5ML IV SOLN
INTRAVENOUS | Status: DC | PRN
  Administered 2018-10-08: 1 mg via INTRAVENOUS
  Administered 2018-10-08 (×2): 2 mg via INTRAVENOUS

## 2018-10-08 MED ORDER — MIDAZOLAM HCL 5 MG/5ML IJ SOLN
INTRAMUSCULAR | Status: DC | PRN
  Administered 2018-10-08: 2 mg via INTRAVENOUS

## 2018-10-08 MED ORDER — CHLORHEXIDINE GLUCONATE 4 % EX LIQD: 60.0000 mL | Freq: Once | CUTANEOUS | Status: DC

## 2018-10-08 MED ORDER — HYDROCODONE-ACETAMINOPHEN 5-325 MG PO TABS
1.0000 | ORAL_TABLET | Freq: Once | ORAL | Status: AC
  Administered 2018-10-08: 1 via ORAL

## 2018-10-08 MED ORDER — FENTANYL CITRATE (PF) 250 MCG/5ML IJ SOLN
INTRAMUSCULAR | Status: AC
  Filled 2018-10-08: qty 5

## 2018-10-08 MED ORDER — PHENYLEPHRINE 40 MCG/ML (10ML) SYRINGE FOR IV PUSH (FOR BLOOD PRESSURE SUPPORT)
PREFILLED_SYRINGE | INTRAVENOUS | Status: DC | PRN
  Administered 2018-10-08: 80 ug via INTRAVENOUS

## 2018-10-08 MED ORDER — FENTANYL CITRATE (PF) 100 MCG/2ML IJ SOLN: 25.0000 ug | INTRAMUSCULAR | Status: DC | PRN

## 2018-10-08 MED ORDER — DEXAMETHASONE SODIUM PHOSPHATE 10 MG/ML IJ SOLN
INTRAMUSCULAR | Status: AC
  Filled 2018-10-08: qty 1

## 2018-10-08 MED ORDER — HEPARIN SODIUM (PORCINE) 1000 UNIT/ML IJ SOLN
1600.0000 [IU] | Freq: Once | INTRAMUSCULAR | Status: AC
  Administered 2018-10-08: 1600 [IU] via INTRAVENOUS

## 2018-10-08 MED ORDER — PROPOFOL 10 MG/ML IV BOLUS
INTRAVENOUS | Status: AC
  Filled 2018-10-08: qty 20

## 2018-10-08 MED ORDER — FENTANYL CITRATE (PF) 100 MCG/2ML IJ SOLN
INTRAMUSCULAR | Status: DC | PRN
  Administered 2018-10-08 (×3): 50 ug via INTRAVENOUS

## 2018-10-08 MED ORDER — HYDROCODONE-ACETAMINOPHEN 5-325 MG PO TABS: 1.0000 | ORAL_TABLET | ORAL | 0 refills | Status: DC | PRN

## 2018-10-08 MED ORDER — PROPOFOL 10 MG/ML IV BOLUS
INTRAVENOUS | Status: DC | PRN
  Administered 2018-10-08: 200 mg via INTRAVENOUS

## 2018-10-08 MED ORDER — HYDROCODONE-ACETAMINOPHEN 5-325 MG PO TABS
ORAL_TABLET | ORAL | Status: AC
  Filled 2018-10-08: qty 1

## 2018-10-08 MED ORDER — ONDANSETRON HCL 4 MG/2ML IJ SOLN: 4.0000 mg | Freq: Once | INTRAMUSCULAR | Status: DC | PRN

## 2018-10-08 MED ORDER — LIDOCAINE-EPINEPHRINE (PF) 1 %-1:200000 IJ SOLN
INTRAMUSCULAR | Status: AC
  Filled 2018-10-08: qty 30

## 2018-10-08 MED ORDER — ONDANSETRON HCL 4 MG/2ML IJ SOLN
INTRAMUSCULAR | Status: DC | PRN
  Administered 2018-10-08: 4 mg via INTRAVENOUS

## 2018-10-08 MED ORDER — DEXAMETHASONE SODIUM PHOSPHATE 10 MG/ML IJ SOLN
INTRAMUSCULAR | Status: DC | PRN
  Administered 2018-10-08: 5 mg via INTRAVENOUS

## 2018-10-08 MED ORDER — SODIUM CHLORIDE 0.9 % IV SOLN
INTRAVENOUS | Status: DC
  Administered 2018-10-08: 08:00:00 via INTRAVENOUS

## 2018-10-08 SURGICAL SUPPLY — 32 items
ARMBAND PINK RESTRICT EXTREMIT (MISCELLANEOUS) ×2 IMPLANT
CANISTER SUCT 3000ML PPV (MISCELLANEOUS) ×2 IMPLANT
CLIP VESOCCLUDE MED 6/CT (CLIP) ×2 IMPLANT
CLIP VESOCCLUDE SM WIDE 6/CT (CLIP) ×2 IMPLANT
COVER PROBE W GEL 5X96 (DRAPES) ×2 IMPLANT
COVER WAND RF STERILE (DRAPES) ×2 IMPLANT
DERMABOND ADVANCED (GAUZE/BANDAGES/DRESSINGS) ×1
DERMABOND ADVANCED .7 DNX12 (GAUZE/BANDAGES/DRESSINGS) ×1 IMPLANT
ELECT REM PT RETURN 9FT ADLT (ELECTROSURGICAL) ×2
ELECTRODE REM PT RTRN 9FT ADLT (ELECTROSURGICAL) ×1 IMPLANT
GLOVE BIOGEL PI IND STRL 7.5 (GLOVE) ×1 IMPLANT
GLOVE BIOGEL PI INDICATOR 7.5 (GLOVE) ×1
GLOVE SURG SS PI 7.5 STRL IVOR (GLOVE) ×2 IMPLANT
GOWN STRL REUS W/ TWL LRG LVL3 (GOWN DISPOSABLE) ×2 IMPLANT
GOWN STRL REUS W/ TWL XL LVL3 (GOWN DISPOSABLE) ×1 IMPLANT
GOWN STRL REUS W/TWL LRG LVL3 (GOWN DISPOSABLE) ×2
GOWN STRL REUS W/TWL XL LVL3 (GOWN DISPOSABLE) ×1
HEMOSTAT SNOW SURGICEL 2X4 (HEMOSTASIS) ×2 IMPLANT
KIT BASIN OR (CUSTOM PROCEDURE TRAY) ×2 IMPLANT
KIT TURNOVER KIT B (KITS) ×2 IMPLANT
NS IRRIG 1000ML POUR BTL (IV SOLUTION) ×2 IMPLANT
PACK CV ACCESS (CUSTOM PROCEDURE TRAY) ×2 IMPLANT
PAD ARMBOARD 7.5X6 YLW CONV (MISCELLANEOUS) ×4 IMPLANT
SUT PROLENE 6 0 CC (SUTURE) ×2 IMPLANT
SUT SILK 2 0 (SUTURE) ×1
SUT SILK 2-0 18XBRD TIE 12 (SUTURE) ×1 IMPLANT
SUT VIC AB 3-0 SH 27 (SUTURE) ×4
SUT VIC AB 3-0 SH 27X BRD (SUTURE) ×4 IMPLANT
SUT VICRYL 4-0 PS2 18IN ABS (SUTURE) ×6 IMPLANT
TOWEL GREEN STERILE (TOWEL DISPOSABLE) ×2 IMPLANT
UNDERPAD 30X30 (UNDERPADS AND DIAPERS) ×2 IMPLANT
WATER STERILE IRR 1000ML POUR (IV SOLUTION) ×2 IMPLANT

## 2018-10-08 NOTE — Op Note (Signed)
    Patient name: Erin Dean MRN: [id number] DOB: [birth year] Sex: female  10/08/2018 Pre-operative Diagnosis: ESRD Post-operative diagnosis:  Same Surgeon:  [name] Assistants:  Arlee Muslim Procedure:   Elevation of left brachiocephalic fistula Anesthesia: General Blood Loss: Minimal Specimens: None  Findings: Excellent caliber vein  Indications: The patient has previously undergone a left brachiocephalic fistula.  This is matured nicely however it is too deep.  She comes in today for elevation.  Procedure:  The patient was identified in the holding area and taken to [facility]  The patient was then placed supine on the table. general anesthesia was administered.  The patient was prepped and draped in the usual sterile fashion.  A time out was called and antibiotics were administered.  2 longitudinal incisions were made anterior to the fistula.  The cephalic vein was circumferentially mobilized from the antecubital crease up to the axilla.  All side branches were ligated.  Once the vein was fully mobilized, and the subcutaneous tissue was closed posterior to the vein with interrupted 3-0 Vicryl.  The skin was closed directly over top of the fistula with running 4-0 Vicryl.  Dermabond was applied to both incisions.  There were no immediate complications.   Disposition: To PACU stable.   Theotis Burrow, M.D., [facility] [place] Office: [phone] Pager:  [phone]

## 2018-10-08 NOTE — Anesthesia Postprocedure Evaluation (Signed)
Anesthesia Post Note  Patient: Erin Dean  Procedure(s) Performed: FISTULA SUPERFICIALIZATION LEFT ARM (Left Arm Upper)     Patient location during evaluation: PACU Anesthesia Type: General Level of consciousness: awake and alert Pain management: pain level controlled Vital Signs Assessment: post-procedure vital signs reviewed and stable Respiratory status: spontaneous breathing, nonlabored ventilation, respiratory function stable and patient connected to nasal cannula oxygen Cardiovascular status: blood pressure returned to baseline and stable Postop Assessment: no apparent nausea or vomiting Anesthetic complications: no    Last Vitals:  Vitals:   10/08/18 1030 10/08/18 1045  BP: (!) 145/76 138/85  Pulse: (!) 58 (!) 59  Resp: 11 11  Temp: (!) 36.1 C   SpO2: 92% 94%    Last Pain:  Vitals:   10/08/18 1030  TempSrc:   PainSc: Tyler Deis

## 2018-10-08 NOTE — Anesthesia Procedure Notes (Signed)
Procedure Name: LMA Insertion Date/Time: 10/08/2018 8:34 AM Performed by: Moshe Salisbury, CRNA Pre-anesthesia Checklist: Patient identified, Emergency Drugs available, Suction available and Patient being monitored Patient Re-evaluated:Patient Re-evaluated prior to induction Oxygen Delivery Method: Circle System Utilized Preoxygenation: Pre-oxygenation with 100% oxygen Induction Type: IV induction Ventilation: Mask ventilation without difficulty LMA: LMA inserted LMA Size: 5.0 Number of attempts: 1 Placement Confirmation: positive ETCO2 Tube secured with: Tape Dental Injury: Teeth and Oropharynx as per pre-operative assessment

## 2018-10-08 NOTE — Transfer of Care (Signed)
Immediate Anesthesia Transfer of Care Note  Patient: Erin Dean  Procedure(s) Performed: FISTULA SUPERFICIALIZATION LEFT ARM (Left Arm Upper)  Patient Location: PACU  Anesthesia Type:General  Level of Consciousness: drowsy and patient cooperative  Airway & Oxygen Therapy: Patient Spontanous Breathing and Patient connected to nasal cannula oxygen  Post-op Assessment: Report given to RN, Post -op Vital signs reviewed and stable and Patient moving all extremities  Post vital signs: Reviewed and stable  Last Vitals:  Vitals Value Taken Time  BP 148/86 10/08/18 0944  Temp    Pulse 63 10/08/18 0945  Resp 13 10/08/18 0945  SpO2 100 % 10/08/18 0945  Vitals shown include unvalidated device data.  Last Pain:  Vitals:   10/08/18 0719  TempSrc: Oral  PainSc:          Complications: No apparent anesthesia complications

## 2018-10-08 NOTE — Discharge Instructions (Addendum)
° °  [facility] [place] ° °Discharge Instructions ° °AV Fistula or Graft Surgery for Dialysis Access ° °Please refer to the following instructions for your post-procedure care. Your surgeon or physician assistant will discuss any changes with you. ° °Activity ° °You may drive the day following your surgery, if you are comfortable and no longer taking prescription pain medication. Resume full activity as the soreness in your incision resolves. ° °Bathing/Showering ° °You may shower after you go home. Keep your incision dry for 48 hours. Do not soak in a bathtub, hot tub, or swim until the incision heals completely. You may not shower if you have a hemodialysis catheter. ° °Incision Care ° °Clean your incision with mild soap and water after 48 hours. Pat the area dry with a clean towel. You do not need a bandage unless otherwise instructed. Do not apply any ointments or creams to your incision. You may have skin glue on your incision. Do not peel it off. It will come off on its own in about one week. Your arm may swell a bit after surgery. To reduce swelling use pillows to elevate your arm so it is above your heart. Your doctor will tell you if you need to lightly wrap your arm with an ACE bandage. ° °Diet ° °Resume your normal diet. There are not special food restrictions following this procedure. In order to heal from your surgery, it is CRITICAL to get adequate nutrition. Your body requires vitamins, minerals, and protein. Vegetables are the best source of vitamins and minerals. Vegetables also provide the perfect balance of protein. Processed food has little nutritional value, so try to avoid this. ° °Medications ° °Resume taking all of your medications. If your incision is causing pain, you may take over-the counter pain relievers such as acetaminophen (Tylenol). If you were prescribed a stronger pain medication, please be aware these medications can cause nausea and constipation. Prevent  nausea by taking the medication with a snack or meal. Avoid constipation by drinking plenty of fluids and eating foods with high amount of fiber, such as fruits, vegetables, and grains. Do not take Tylenol if you are taking prescription pain medications. ° ° ° ° °Follow up °Your surgeon may want to see you in the office following your access surgery. If so, this will be arranged at the time of your surgery. ° °Please call us immediately for any of the following conditions: ° °Increased pain, redness, drainage (pus) from your incision site °Fever of 101 degrees or higher °Severe or worsening pain at your incision site °Hand pain or numbness. ° °Reduce your risk of vascular disease: ° °Stop smoking. If you would like help, call [facility] at 1-800-QUIT-NOW (1-800-784-8669) or [place] at 336-586-4000 ° °Manage your cholesterol °Maintain a desired weight °Control your diabetes °Keep your blood pressure down ° °Dialysis ° °It will take several weeks to several months for your new dialysis access to be ready for use. Your surgeon will determine when it is OK to use it. Your nephrologist will continue to direct your dialysis. You can continue to use your Permcath until your new access is ready for use. ° °If you have any questions, please call the office at [phone]. ° °

## 2018-10-08 NOTE — Interval H&P Note (Signed)
History and Physical Interval Note:  10/08/2018 7:32 AM  Erin Dean  has presented today for surgery, with the diagnosis of COMPLICATION OF ARTERIOVENOUS FISTULA LEFT ARM.  The various methods of treatment have been discussed with the patient and family. After consideration of risks, benefits and other options for treatment, the patient has consented to  Procedure(s): FISTULA SUPERFICIALIZATION LEFT ARM (Left) as a surgical intervention.  The patient's history has been reviewed, patient examined, no change in status, stable for surgery.  I have reviewed the patient's chart and labs.  Questions were answered to the patient's satisfaction.     [name]

## 2018-10-08 NOTE — Progress Notes (Signed)
Orthopedic Tech Progress Note Patient Details:  Erin Dean [birth year] [id number] PACU RN called requesting a large arm sling for patient.once I got there patient was getting dressed and PACU tech she would assist the patient. Ortho Devices Type of Ortho Device: Arm sling Ortho Device/Splint Interventions: Other (comment)   Post Interventions Patient Tolerated: Other (comment) Instructions Provided: Other (comment)   Janit Pagan 10/08/2018, 11:56 AM

## 2018-10-09 ENCOUNTER — Encounter (HOSPITAL_COMMUNITY): Payer: Self-pay | Admitting: Surgery

## 2018-11-03 ENCOUNTER — Ambulatory Visit (INDEPENDENT_AMBULATORY_CARE_PROVIDER_SITE_OTHER): Payer: Self-pay | Admitting: Family

## 2018-11-03 ENCOUNTER — Encounter: Payer: Self-pay | Admitting: Family

## 2018-11-03 ENCOUNTER — Other Ambulatory Visit: Payer: Self-pay

## 2018-11-03 VITALS — BP 158/89 | HR 68 | Temp 97.0°F | Resp 20 | Ht 68.0 in | Wt 260.0 lb

## 2018-11-03 DIAGNOSIS — Z48812 Encounter for surgical aftercare following surgery on the circulatory system: Secondary | ICD-10-CM

## 2018-11-03 DIAGNOSIS — N186 End stage renal disease: Secondary | ICD-10-CM

## 2018-11-03 DIAGNOSIS — Z992 Dependence on renal dialysis: Secondary | ICD-10-CM

## 2018-11-03 DIAGNOSIS — I770 Arteriovenous fistula, acquired: Secondary | ICD-10-CM

## 2018-11-03 NOTE — Progress Notes (Signed)
CC: 2-3 week post op check of elevation left brachiocephalic AVF  History of Present Illness  Erin Dean is a 53 y.o. (Nov 16, 1965) female who is s/p elevation of left brachiocephalic fistula on 12-11-48 by Dr. Trula Slade.  Findings: Excellent caliber vein The patient has previously undergone a left brachiocephalic fistula creation.  This matured nicely however it was too deep.  She dialyzes TTS via right IJ TDC at [facility]   She denies any steal type symptoms in he left upper extremity. She is left hand dominant.  She denies fever or chills.    Past Medical History:  Diagnosis Date  . ESRD (end stage renal disease) ([facility])    [facility]   . Hypertension   . Stroke ([place]) 04/2017   no residual . Limp left side  . SVT (supraventricular tachycardia) ([facility])     Social History Social History   Tobacco Use  . Smoking status: Never Smoker  . Smokeless tobacco: Never Used  Substance Use Topics  . Alcohol use: No  . Drug use: No    Family History Family History  Problem Relation Age of Onset  . Heart attack Maternal Uncle   . Heart attack Maternal Grandfather   . Stroke Paternal Grandmother   . Cancer Father   . Hypertension Sister     Surgical History Past Surgical History:  Procedure Laterality Date  . AV FISTULA PLACEMENT Right 08/16/2017   Procedure: CREATION OF RADIOCEPHALIC VERSUS BRACHIOCEPHALIC ARTERIOVENOUS FISTULA RIGHT ARM;  Surgeon: Conrad Alameda, MD;  Location: [facility];  Service: Vascular;  Laterality: Right;  . AV FISTULA PLACEMENT Right 02/07/2018   Procedure: Creation Right arm Brachiocephalic Fistula;  Surgeon: Marty Heck, MD;  Location: [facility];  Service: Vascular;  Laterality: Right;  . AV FISTULA PLACEMENT Left 08/15/2018   Procedure: ARTERIOVENOUS (AV) FISTULA CREATION;  Surgeon: Serafina Mitchell, MD;  Location: [facility];  Service: Vascular;  Laterality: Left;  . BASCILIC VEIN TRANSPOSITION Right 04/07/2018   Procedure: SECOND STAGE  BASILIC VEIN TRANSPOSITION RIGHT ARM;  Surgeon: Marty Heck, MD;  Location: [facility];  Service: Vascular;  Laterality: Right;  . CESAREAN SECTION    . ESOPHAGOGASTRODUODENOSCOPY N/A 04/19/2017   Procedure: ESOPHAGOGASTRODUODENOSCOPY (EGD);  Surgeon: Carol Ada, MD;  Location: [facility];  Service: Endoscopy;  Laterality: N/A;  . FISTULA SUPERFICIALIZATION Right 10/01/2017   Procedure: FISTULA SUPERFICIALIZATION RIGHT ARM;  Surgeon: Conrad Milan, MD;  Location: [facility];  Service: Vascular;  Laterality: Right;  . FISTULA SUPERFICIALIZATION Left 10/08/2018   Procedure: FISTULA SUPERFICIALIZATION LEFT ARM;  Surgeon: Serafina Mitchell, MD;  Location: [facility];  Service: Vascular;  Laterality: Left;  . IR FLUORO GUIDE CV LINE RIGHT  04/23/2018  . IR US GUIDE VASC ACCESS RIGHT  04/23/2018  . LOOP RECORDER INSERTION N/A 04/22/2017   Procedure: LOOP RECORDER INSERTION;  Surgeon: Constance Haw, MD;  Location: [facility];  Service: Cardiovascular;  Laterality: N/A;  . TEE WITHOUT CARDIOVERSION N/A 04/19/2017   Procedure: TRANSESOPHAGEAL ECHOCARDIOGRAM (TEE);  Surgeon: Carol Ada, MD;  Location: [facility];  Service: Endoscopy;  Laterality: N/A;    No Known Allergies  Current Outpatient Medications  Medication Sig Dispense Refill  . acetaminophen (TYLENOL) 500 MG tablet Take 2 tablets (1,000 mg total) by mouth daily as needed for moderate pain or headache. 30 tablet 0  . ALPRAZolam (XANAX) 0.5 MG tablet Take 0.5 mg by mouth daily as needed for anxiety.     Marland Kitchen  amLODipine (NORVASC) 10 MG tablet Take 1 tablet (10 mg total) by mouth daily. 180 tablet 3  . aspirin EC 81 MG tablet Take 81 mg by mouth daily.     Marland Kitchen atorvastatin (LIPITOR) 40 MG tablet Take 1 tablet (40 mg total) by mouth daily at 6 PM. 30 tablet 1  . calcitRIOL (ROCALTROL) 0.25 MCG capsule Take 1 capsule (0.25 mcg total) by mouth daily. 30 capsule 2  . carvedilol (COREG) 25 MG tablet Take 1 tablet (25 mg total) by mouth 2 (two)  times daily with a meal. 60 tablet 2  . cloNIDine (CATAPRES) 0.1 MG tablet Take 1 tablet (0.1 mg total) by mouth 3 (three) times daily. 60 tablet 11  . clopidogrel (PLAVIX) 75 MG tablet Take 1 tablet (75 mg total) by mouth daily. 30 tablet 2  . cyanocobalamin 1000 MCG tablet Take 1 tablet (1,000 mcg total) by mouth daily. 30 tablet 2  . escitalopram (LEXAPRO) 10 MG tablet Take 1 tablet (10 mg total) by mouth daily. 30 tablet 2  . ferrous sulfate 325 (65 FE) MG tablet Take 1 tablet (325 mg total) by mouth 3 (three) times daily with meals. 90 tablet 2  . furosemide (LASIX) 40 MG tablet Take 1 tablet (40 mg total) by mouth See admin instructions. Take 40 mg tablet with 80 mg to equal 120 twice daily then take a third 40 mg dose by itself 60 tablet 2  . furosemide (LASIX) 80 MG tablet Take 1 tablet (80 mg total) by mouth 2 (two) times daily. Take with 40 mg to equal 120 mg twice daily 60 tablet 2  . hydrALAZINE (APRESOLINE) 100 MG tablet Take 1 tablet (100 mg total) by mouth 3 (three) times daily. 30 tablet 2  . HYDROcodone-acetaminophen (NORCO) 5-325 MG tablet Take 1 tablet by mouth every 4 (four) hours as needed for moderate pain. 12 tablet 0  . multivitamin (RENA-VIT) TABS tablet Take 1 tablet by mouth daily.     . pantoprazole (PROTONIX) 40 MG tablet Take 1 tablet (40 mg total) by mouth daily. 30 tablet 2  . RENAGEL 800 MG tablet Take 3,200 mg by mouth See admin instructions. Take 3200 mg with each meal and snack     No current facility-administered medications for this visit.      REVIEW OF SYSTEMS: see HPI for pertinent positives and negatives    PHYSICAL EXAMINATION:  Vitals:   11/03/18 1318 11/03/18 1323  BP: (!) 173/87 (!) 158/89  Pulse: 68   Resp: 20   Temp: (!) 97 F (36.1 C)   TempSrc: Oral   SpO2: 94%   Weight: 260 lb (117.9 kg)   Height: 5\' 8"  (1.727 m)    Body mass index is 39.53 kg/m.  General: Obese female in NAD   HEENT:  No gross abnormalities Pulmonary:  Respirations are non-labored Abdomen: Soft and non-tender  Musculoskeletal: There are no major deformities.   Neurologic: No focal weakness or paresthesias are detected, bilateral hand grip strength is 5/5  Skin: There are no ulcer or rashes noted. Incision at left upper arm is well healed save for one small scab.  Psychiatric: The patient has normal affect. Cardiovascular: There is a regular rate and rhythm without significant murmur appreciated.  Left upper arm AVF with palpable thrill and audible bruit.  Left radial pulse is palpable.   Medical Decision Making  Erin Dean is a 53 y.o. female who is s/p elevation of left brachiocephalic fistula on 09-08-93 by Dr.  Brabham.  The patient has previously undergone a left brachiocephalic fistula creation.  This matured nicely however it was too deep.  She reports also having more than one previous accesses in her right arm.   Left upper arm incision is well healed save for one small scab, no signs of infection, no steal sx's, palpable left radial pulse  Follow up in 3-4 weeks with left arm AVF duplex, see me or PA or Dr. Freeman Caldron Erin Molina, RN, MSN, FNP-C [facility] Office: [phone]  11/03/2018, 1:28 PM  Clinic MD: Oneida Alar on call

## 2018-11-20 ENCOUNTER — Other Ambulatory Visit: Payer: Self-pay

## 2018-11-20 DIAGNOSIS — N186 End stage renal disease: Secondary | ICD-10-CM

## 2018-11-20 DIAGNOSIS — Z992 Dependence on renal dialysis: Secondary | ICD-10-CM

## 2018-12-01 ENCOUNTER — Ambulatory Visit (INDEPENDENT_AMBULATORY_CARE_PROVIDER_SITE_OTHER): Payer: Self-pay | Admitting: Physician Assistant

## 2018-12-01 ENCOUNTER — Other Ambulatory Visit: Payer: Self-pay

## 2018-12-01 ENCOUNTER — Ambulatory Visit (HOSPITAL_COMMUNITY)
Admission: RE | Admit: 2018-12-01 | Discharge: 2018-12-01 | Disposition: A | Discharge status: Home / Self Care | Payer: Medicare Other | Source: Ambulatory Visit | Attending: Surgery | Admitting: Surgery

## 2018-12-01 VITALS — BP 112/69 | HR 52 | Temp 97.2°F | Resp 14 | Ht 68.0 in | Wt 255.0 lb

## 2018-12-01 DIAGNOSIS — Z992 Dependence on renal dialysis: Secondary | ICD-10-CM

## 2018-12-01 DIAGNOSIS — N186 End stage renal disease: Secondary | ICD-10-CM | POA: Insufficient documentation

## 2018-12-01 NOTE — Progress Notes (Signed)
    Postoperative Access Visit   History of Present Illness   Erin Dean is a 53 y.o. year old female who presents for postoperative follow-up for: left superficialization of brachiocephalic fistula (Date: [id number]) by Dr. Trula Slade.  The patient's wounds are healed.  The patient denies steal symptoms.  The patient is able to complete their activities of daily living.  She is currently dialyzing on a TTS schedule via R IJ TDC.  She had a brachiocephalic and brachiobasilic fistula in R arm that occluded before being used for HD.  Physical Examination   Vitals:   12/01/18 1328  BP: 112/69  Pulse: (!) 52  Resp: 14  Temp: (!) 97.2 F (36.2 C)  TempSrc: Temporal  SpO2: 97%  Weight: 255 lb (115.7 kg)  Height: 5\' 8"  (1.727 m)   Body mass index is 38.77 kg/m.  left arm Incisions are healed, hand grip is 5/5, sensation in digits is intact, palpable thrill, bruit can be auscultated, palpable L radial pulse     Medical Decision Making   Erin Dean is a 53 y.o. year old female who presents s/p left superficialization of brachiocephalic fistula   Patent fistula without signs or symptoms of steal syndrome  The patient's access will be ready for use tomorrow 12/02/18  The patient's tunneled dialysis catheter can be removed when Nephrology is comfortable with the performance of the fistula  The patient may follow up on a prn basis   Dagoberto Ligas PA-C [facility] Office: [phone]  Clinic MD: Dr. Trula Slade

## 2019-08-30 IMAGING — US IR FLUORO GUIDE CV LINE*R*
1 series · 2 of 2 positions shown · non-contrast
Comparison: none

CLINICAL DATA: End-stage renal disease and need for hemodialysis.
Status post right arm AV fistula placement with basilic
transposition. The patient requires a tunneled hemodialysis catheter
well the fistula mature.

[Series 1: ir fluoro guide cv line*right* · 2 of 2 slices shown]
[im 1/2]
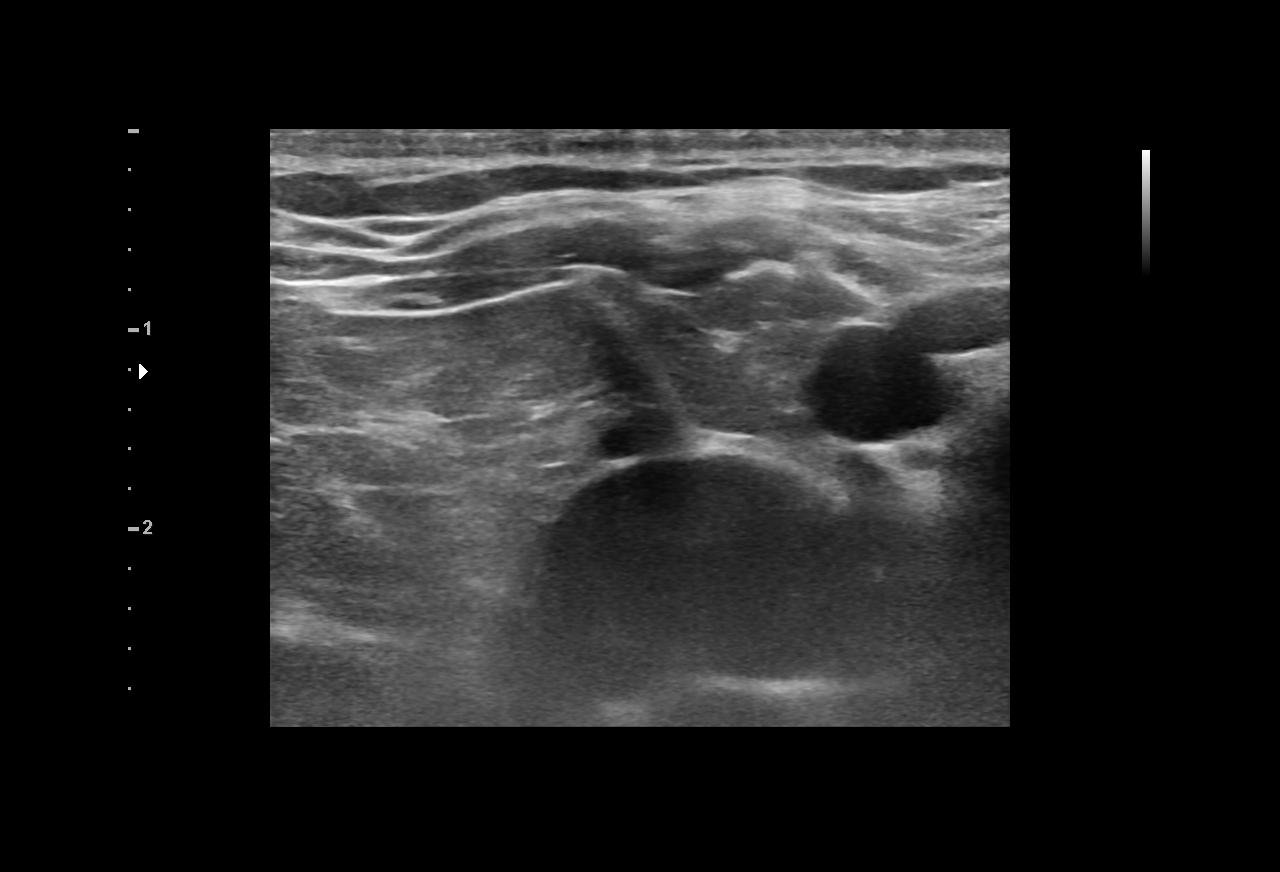
[im 2/2]
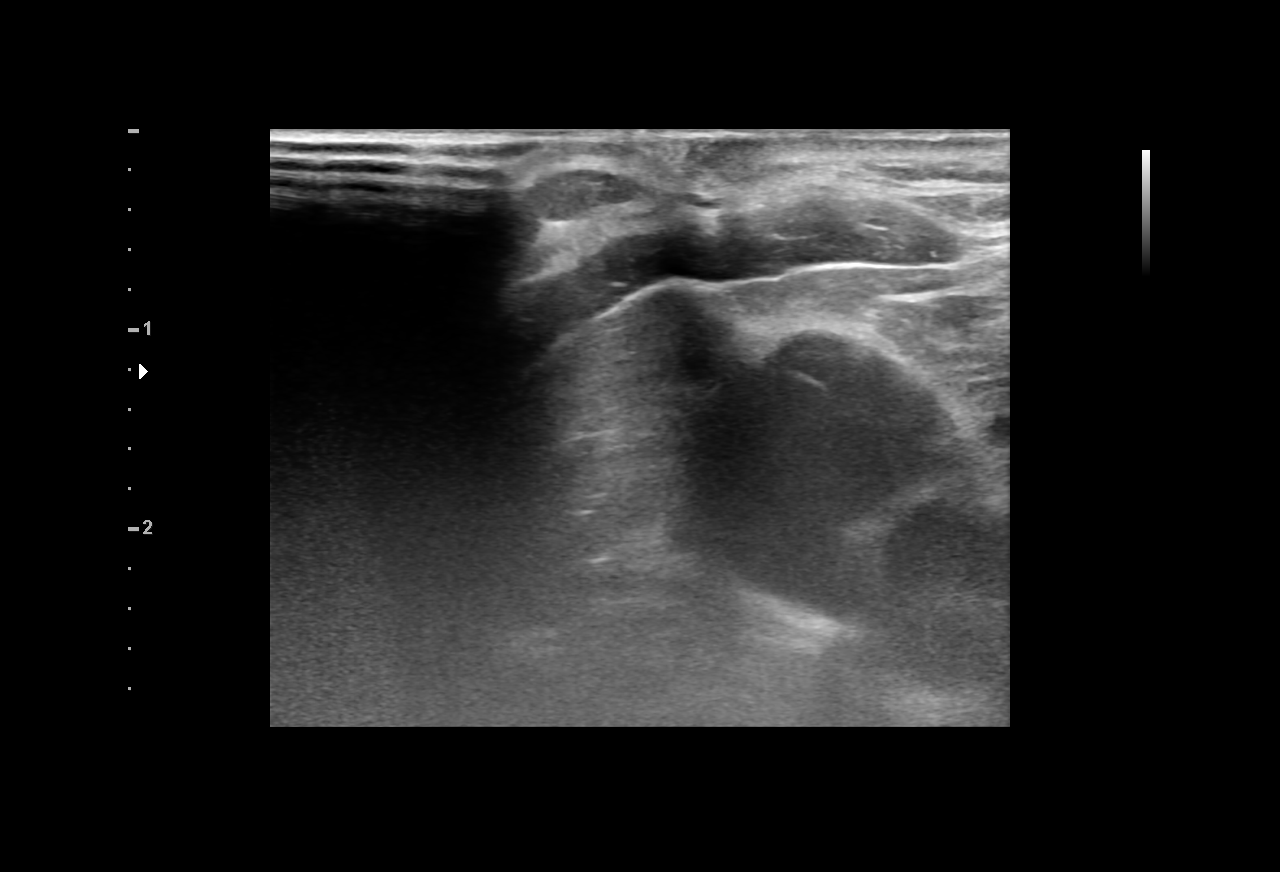

[2 of 2 positions shown; findings below may reference images not displayed]

EXAM:
TUNNELED CENTRAL VENOUS HEMODIALYSIS CATHETER PLACEMENT WITH
ULTRASOUND AND FLUOROSCOPIC GUIDANCE

ANESTHESIA/SEDATION:
1.0 mg IV Versed; 100 mcg IV Fentanyl.

Total Moderate Sedation Time:   17 minutes.

The patient's level of consciousness and physiologic status were
continuously monitored during the procedure by Radiology nursing.

MEDICATIONS:
2 g IV Ancef. As antibiotic prophylaxis, Ancef was ordered
pre-procedure and administered intravenously within one hour of
incision.

FLUOROSCOPY TIME:  18 seconds.  3.2 mGy.

PROCEDURE:
The procedure, risks, benefits, and alternatives were explained to
the patient. Questions regarding the procedure were encouraged and
answered. The patient understands and consents to the procedure. A
timeout was performed prior to initiating the procedure.

Ultrasound was used to confirm patency of the right internal jugular
vein. The right neck and chest were prepped with chlorhexidine in a
sterile fashion, and a sterile drape was applied covering the
operative field. Maximum barrier sterile technique with sterile
gowns and gloves were used for the procedure. Local anesthesia was
provided with 1% lidocaine.

After creating a small venotomy incision, a 21 gauge needle was
advanced into the right internal jugular vein under direct,
real-time ultrasound guidance. Ultrasound image documentation was
performed. After securing guidewire access, an 8 Fr dilator was
placed. A J-wire was kinked to measure appropriate catheter length.

A Palindrome tunneled hemodialysis catheter measuring 19 cm from tip
to cuff was chosen for placement. This was tunneled in a retrograde
fashion from the chest wall to the venotomy incision.

At the venotomy, serial dilatation was performed and a 16 Fr
peel-away sheath was placed over a guidewire. The catheter was then
placed through the sheath and the sheath removed. Final catheter
positioning was confirmed and documented with a fluoroscopic spot
image. The catheter was aspirated, flushed with saline, and injected
with appropriate volume heparin dwells.

The venotomy incision was closed with subcutaneous subcuticular 4-0
Vicryl. Dermabond was applied to the incision. The catheter exit
site was secured with 0-Prolene retention sutures.

COMPLICATIONS:
None.  No pneumothorax.
FINDINGS: After catheter placement, the tip lies in the right atrium. The
catheter aspirates normally and is ready for immediate use.
IMPRESSION: Placement of tunneled hemodialysis catheter via the right internal
jugular vein. The catheter tip lies in the right atrium. The
catheter is ready for immediate use.

## 2020-03-31 ENCOUNTER — Emergency Department (HOSPITAL_COMMUNITY): Payer: Medicare Other

## 2020-03-31 ENCOUNTER — Inpatient Hospital Stay (HOSPITAL_COMMUNITY)
Admission: EM | Admit: 2020-03-31 | Discharge: 2020-04-03 | DRG: 064 | Disposition: A | Discharge status: Home / Self Care | Payer: Medicare Other | Attending: Internal Medicine | Admitting: Internal Medicine

## 2020-03-31 ENCOUNTER — Other Ambulatory Visit: Payer: Self-pay

## 2020-03-31 ENCOUNTER — Encounter (HOSPITAL_COMMUNITY): Payer: Self-pay | Admitting: *Deleted

## 2020-03-31 DIAGNOSIS — I63412 Cerebral infarction due to embolism of left middle cerebral artery: Principal | ICD-10-CM | POA: Diagnosis present

## 2020-03-31 DIAGNOSIS — I6502 Occlusion and stenosis of left vertebral artery: Secondary | ICD-10-CM | POA: Diagnosis present

## 2020-03-31 DIAGNOSIS — F40240 Claustrophobia: Secondary | ICD-10-CM | POA: Diagnosis present

## 2020-03-31 DIAGNOSIS — Z23 Encounter for immunization: Secondary | ICD-10-CM | POA: Diagnosis not present

## 2020-03-31 DIAGNOSIS — Z888 Allergy status to other drugs, medicaments and biological substances status: Secondary | ICD-10-CM

## 2020-03-31 DIAGNOSIS — E8889 Other specified metabolic disorders: Secondary | ICD-10-CM | POA: Diagnosis present

## 2020-03-31 DIAGNOSIS — G253 Myoclonus: Secondary | ICD-10-CM | POA: Diagnosis present

## 2020-03-31 DIAGNOSIS — U071 COVID-19: Secondary | ICD-10-CM | POA: Diagnosis present

## 2020-03-31 DIAGNOSIS — G9349 Other encephalopathy: Secondary | ICD-10-CM | POA: Diagnosis present

## 2020-03-31 DIAGNOSIS — E785 Hyperlipidemia, unspecified: Secondary | ICD-10-CM | POA: Diagnosis present

## 2020-03-31 DIAGNOSIS — E875 Hyperkalemia: Secondary | ICD-10-CM | POA: Diagnosis present

## 2020-03-31 DIAGNOSIS — R4701 Aphasia: Secondary | ICD-10-CM | POA: Diagnosis present

## 2020-03-31 DIAGNOSIS — I69954 Hemiplegia and hemiparesis following unspecified cerebrovascular disease affecting left non-dominant side: Secondary | ICD-10-CM | POA: Diagnosis not present

## 2020-03-31 DIAGNOSIS — I351 Nonrheumatic aortic (valve) insufficiency: Secondary | ICD-10-CM | POA: Diagnosis not present

## 2020-03-31 DIAGNOSIS — E1122 Type 2 diabetes mellitus with diabetic chronic kidney disease: Secondary | ICD-10-CM | POA: Diagnosis present

## 2020-03-31 DIAGNOSIS — Z823 Family history of stroke: Secondary | ICD-10-CM

## 2020-03-31 DIAGNOSIS — Z992 Dependence on renal dialysis: Secondary | ICD-10-CM | POA: Diagnosis not present

## 2020-03-31 DIAGNOSIS — Z7982 Long term (current) use of aspirin: Secondary | ICD-10-CM

## 2020-03-31 DIAGNOSIS — R269 Unspecified abnormalities of gait and mobility: Secondary | ICD-10-CM | POA: Diagnosis present

## 2020-03-31 DIAGNOSIS — I639 Cerebral infarction, unspecified: Secondary | ICD-10-CM | POA: Diagnosis present

## 2020-03-31 DIAGNOSIS — R2990 Unspecified symptoms and signs involving the nervous system: Secondary | ICD-10-CM

## 2020-03-31 DIAGNOSIS — G9340 Encephalopathy, unspecified: Secondary | ICD-10-CM | POA: Diagnosis not present

## 2020-03-31 DIAGNOSIS — D631 Anemia in chronic kidney disease: Secondary | ICD-10-CM | POA: Diagnosis present

## 2020-03-31 DIAGNOSIS — Z0184 Encounter for antibody response examination: Secondary | ICD-10-CM

## 2020-03-31 DIAGNOSIS — R29707 NIHSS score 7: Secondary | ICD-10-CM | POA: Diagnosis present

## 2020-03-31 DIAGNOSIS — Z8249 Family history of ischemic heart disease and other diseases of the circulatory system: Secondary | ICD-10-CM

## 2020-03-31 DIAGNOSIS — I5032 Chronic diastolic (congestive) heart failure: Secondary | ICD-10-CM | POA: Diagnosis present

## 2020-03-31 DIAGNOSIS — Z7902 Long term (current) use of antithrombotics/antiplatelets: Secondary | ICD-10-CM

## 2020-03-31 DIAGNOSIS — N186 End stage renal disease: Secondary | ICD-10-CM | POA: Diagnosis present

## 2020-03-31 DIAGNOSIS — E669 Obesity, unspecified: Secondary | ICD-10-CM | POA: Diagnosis present

## 2020-03-31 DIAGNOSIS — R471 Dysarthria and anarthria: Secondary | ICD-10-CM | POA: Diagnosis present

## 2020-03-31 DIAGNOSIS — I6389 Other cerebral infarction: Secondary | ICD-10-CM | POA: Diagnosis not present

## 2020-03-31 DIAGNOSIS — F32A Depression, unspecified: Secondary | ICD-10-CM | POA: Diagnosis present

## 2020-03-31 DIAGNOSIS — I10 Essential (primary) hypertension: Secondary | ICD-10-CM | POA: Diagnosis present

## 2020-03-31 DIAGNOSIS — Z79899 Other long term (current) drug therapy: Secondary | ICD-10-CM

## 2020-03-31 DIAGNOSIS — F419 Anxiety disorder, unspecified: Secondary | ICD-10-CM | POA: Diagnosis present

## 2020-03-31 DIAGNOSIS — I132 Hypertensive heart and chronic kidney disease with heart failure and with stage 5 chronic kidney disease, or end stage renal disease: Secondary | ICD-10-CM | POA: Diagnosis present

## 2020-03-31 DIAGNOSIS — Z6838 Body mass index (BMI) 38.0-38.9, adult: Secondary | ICD-10-CM

## 2020-03-31 DIAGNOSIS — R29810 Facial weakness: Secondary | ICD-10-CM | POA: Diagnosis present

## 2020-03-31 DIAGNOSIS — I672 Cerebral atherosclerosis: Secondary | ICD-10-CM | POA: Diagnosis present

## 2020-03-31 LAB — COMPREHENSIVE METABOLIC PANEL
ALT: 12 U/L (ref 0–44)
AST: 16 U/L (ref 15–41)
Albumin: 3.1 g/dL — ABNORMAL LOW (ref 3.5–5.0)
Alkaline Phosphatase: 66 U/L (ref 38–126)
Anion gap: 18 — ABNORMAL HIGH (ref 5–15)
BUN: 68 mg/dL — ABNORMAL HIGH (ref 6–20)
CO2: 25 mmol/L (ref 22–32)
Calcium: 7.5 mg/dL — ABNORMAL LOW (ref 8.9–10.3)
Chloride: 94 mmol/L — ABNORMAL LOW (ref 98–111)
Creatinine, Ser: 12.72 mg/dL — ABNORMAL HIGH (ref 0.44–1.00)
GFR, Estimated: 3 mL/min — ABNORMAL LOW (ref >60)
Glucose, Bld: 94 mg/dL (ref 70–99)
Potassium: 5.6 mmol/L — ABNORMAL HIGH (ref 3.5–5.1)
Sodium: 137 mmol/L (ref 135–145)
Total Bilirubin: 0.6 mg/dL (ref 0.3–1.2)
Total Protein: 8.3 g/dL — ABNORMAL HIGH (ref 6.5–8.1)

## 2020-03-31 LAB — I-STAT CHEM 8, ED
BUN: 69 mg/dL — ABNORMAL HIGH (ref 6–20)
Calcium, Ion: 0.87 mmol/L — CL (ref 1.15–1.40)
Chloride: 96 mmol/L — ABNORMAL LOW (ref 98–111)
Creatinine, Ser: 13 mg/dL — ABNORMAL HIGH (ref 0.44–1.00)
Glucose, Bld: 88 mg/dL (ref 70–99)
HCT: 34 % — ABNORMAL LOW (ref 36.0–46.0)
Hemoglobin: 11.6 g/dL — ABNORMAL LOW (ref 12.0–15.0)
Potassium: 5.5 mmol/L — ABNORMAL HIGH (ref 3.5–5.1)
Sodium: 138 mmol/L (ref 135–145)
TCO2: 27 mmol/L (ref 22–32)

## 2020-03-31 LAB — DIFFERENTIAL
Abs Immature Granulocytes: 0.11 10*3/uL — ABNORMAL HIGH (ref 0.00–0.07)
Basophils Absolute: 0.1 10*3/uL (ref 0.0–0.1)
Basophils Relative: 1 %
Eosinophils Absolute: 0.3 10*3/uL (ref 0.0–0.5)
Eosinophils Relative: 3 %
Immature Granulocytes: 1 %
Lymphocytes Relative: 21 %
Lymphs Abs: 1.8 10*3/uL (ref 0.7–4.0)
Monocytes Absolute: 1.5 10*3/uL — ABNORMAL HIGH (ref 0.1–1.0)
Monocytes Relative: 17 %
Neutro Abs: 4.8 10*3/uL (ref 1.7–7.7)
Neutrophils Relative %: 57 %

## 2020-03-31 LAB — RESP PANEL BY RT-PCR (FLU A&B, COVID) ARPGX2
Influenza A by PCR: NEGATIVE
Influenza A by PCR: NEGATIVE
Influenza B by PCR: NEGATIVE
Influenza B by PCR: NEGATIVE
SARS Coronavirus 2 by RT PCR: POSITIVE — AB
SARS Coronavirus 2 by RT PCR: POSITIVE — AB

## 2020-03-31 LAB — PROTIME-INR
INR: 1.1 (ref 0.8–1.2)
Prothrombin Time: 13.7 seconds (ref 11.4–15.2)

## 2020-03-31 LAB — CBC
HCT: 34.2 % — ABNORMAL LOW (ref 36.0–46.0)
Hemoglobin: 11 g/dL — ABNORMAL LOW (ref 12.0–15.0)
MCH: 31.2 pg (ref 26.0–34.0)
MCHC: 32.2 g/dL (ref 30.0–36.0)
MCV: 96.9 fL (ref 80.0–100.0)
Platelets: 198 10*3/uL (ref 150–400)
RBC: 3.53 MIL/uL — ABNORMAL LOW (ref 3.87–5.11)
RDW: 14.8 % (ref 11.5–15.5)
WBC: 8.6 10*3/uL (ref 4.0–10.5)
nRBC: 0 % (ref 0.0–0.2)

## 2020-03-31 LAB — FIBRINOGEN: Fibrinogen: 617 mg/dL — ABNORMAL HIGH (ref 210–475)

## 2020-03-31 LAB — TSH: TSH: 1.974 u[IU]/mL (ref 0.350–4.500)

## 2020-03-31 LAB — HIV ANTIBODY (ROUTINE TESTING W REFLEX): HIV Screen 4th Generation wRfx: NONREACTIVE

## 2020-03-31 LAB — CBG MONITORING, ED: Glucose-Capillary: 100 mg/dL — ABNORMAL HIGH (ref 70–99)

## 2020-03-31 LAB — C-REACTIVE PROTEIN: CRP: 2.2 mg/dL — ABNORMAL HIGH (ref <1.0)

## 2020-03-31 LAB — HEMOGLOBIN A1C
Hgb A1c MFr Bld: 5.3 % (ref 4.8–5.6)
Mean Plasma Glucose: 105.41 mg/dL

## 2020-03-31 LAB — APTT: aPTT: 27 seconds (ref 24–36)

## 2020-03-31 LAB — GLUCOSE, CAPILLARY
Glucose-Capillary: 104 mg/dL — ABNORMAL HIGH (ref 70–99)
Glucose-Capillary: 126 mg/dL — ABNORMAL HIGH (ref 70–99)

## 2020-03-31 LAB — LACTATE DEHYDROGENASE: LDH: 193 U/L — ABNORMAL HIGH (ref 98–192)

## 2020-03-31 LAB — PROCALCITONIN: Procalcitonin: 0.27 ng/mL

## 2020-03-31 LAB — D-DIMER, QUANTITATIVE: D-Dimer, Quant: 1.16 ug/mL-FEU — ABNORMAL HIGH (ref 0.00–0.50)

## 2020-03-31 LAB — FERRITIN: Ferritin: 1298 ng/mL — ABNORMAL HIGH (ref 11–307)

## 2020-03-31 MED ORDER — PANTOPRAZOLE SODIUM 40 MG PO TBEC: 40.0000 mg | DELAYED_RELEASE_TABLET | Freq: Every day | ORAL | Status: DC

## 2020-03-31 MED ORDER — SORBITOL 70 % SOLN
30.0000 mL | Status: DC | PRN
  Filled 2020-03-31: qty 30

## 2020-03-31 MED ORDER — CALCITRIOL 0.5 MCG PO CAPS
3.7500 ug | ORAL_CAPSULE | ORAL | Status: DC
  Filled 2020-03-31: qty 1

## 2020-03-31 MED ORDER — STROKE: EARLY STAGES OF RECOVERY BOOK: Freq: Once | Status: DC

## 2020-03-31 MED ORDER — ESCITALOPRAM OXALATE 10 MG PO TABS
10.0000 mg | ORAL_TABLET | Freq: Every day | ORAL | Status: DC
  Administered 2020-03-31 – 2020-04-03 (×4): 10 mg via ORAL
  Filled 2020-03-31 (×5): qty 1

## 2020-03-31 MED ORDER — SEVELAMER CARBONATE 800 MG PO TABS
3200.0000 mg | ORAL_TABLET | Freq: Three times a day (TID) | ORAL | Status: DC
  Administered 2020-03-31 – 2020-04-03 (×8): 3200 mg via ORAL
  Filled 2020-03-31 (×10): qty 4

## 2020-03-31 MED ORDER — CINACALCET HCL 30 MG PO TABS
30.0000 mg | ORAL_TABLET | Freq: Every day | ORAL | Status: DC
  Administered 2020-03-31: 30 mg via ORAL
  Filled 2020-03-31: qty 1

## 2020-03-31 MED ORDER — ONDANSETRON HCL 4 MG PO TABS: 4.0000 mg | ORAL_TABLET | Freq: Four times a day (QID) | ORAL | Status: DC | PRN

## 2020-03-31 MED ORDER — CALCIUM CARBONATE ANTACID 1250 MG/5ML PO SUSP
500.0000 mg | Freq: Four times a day (QID) | ORAL | Status: DC | PRN
  Filled 2020-03-31: qty 5

## 2020-03-31 MED ORDER — ATORVASTATIN CALCIUM 80 MG PO TABS
80.0000 mg | ORAL_TABLET | Freq: Every day | ORAL | Status: DC
  Administered 2020-03-31 – 2020-04-02 (×3): 80 mg via ORAL
  Filled 2020-03-31 (×4): qty 1

## 2020-03-31 MED ORDER — ONDANSETRON HCL 4 MG/2ML IJ SOLN
4.0000 mg | Freq: Four times a day (QID) | INTRAMUSCULAR | Status: DC | PRN
  Administered 2020-04-01: 4 mg via INTRAVENOUS

## 2020-03-31 MED ORDER — HYDROXYZINE HCL 25 MG PO TABS: 25.0000 mg | ORAL_TABLET | Freq: Three times a day (TID) | ORAL | Status: DC | PRN

## 2020-03-31 MED ORDER — ZOLPIDEM TARTRATE 5 MG PO TABS: 5.0000 mg | ORAL_TABLET | Freq: Every evening | ORAL | Status: DC | PRN

## 2020-03-31 MED ORDER — ACETAMINOPHEN 500 MG PO TABS
1000.0000 mg | ORAL_TABLET | Freq: Once | ORAL | Status: AC
  Administered 2020-03-31: 1000 mg via ORAL
  Filled 2020-03-31: qty 2

## 2020-03-31 MED ORDER — HEPARIN SODIUM (PORCINE) 5000 UNIT/ML IJ SOLN
5000.0000 [IU] | Freq: Three times a day (TID) | INTRAMUSCULAR | Status: DC
  Administered 2020-03-31 – 2020-04-03 (×9): 5000 [IU] via SUBCUTANEOUS
  Filled 2020-03-31 (×10): qty 1

## 2020-03-31 MED ORDER — LIDOCAINE HCL (PF) 1 % IJ SOLN
5.0000 mL | INTRAMUSCULAR | Status: DC | PRN
Start: 2020-03-31 — End: 2020-04-03
  Filled 2020-03-31: qty 5

## 2020-03-31 MED ORDER — CLOPIDOGREL BISULFATE 75 MG PO TABS
75.0000 mg | ORAL_TABLET | Freq: Every day | ORAL | Status: DC
Start: 2020-03-31 — End: 2020-04-02
  Administered 2020-03-31 – 2020-04-02 (×3): 75 mg via ORAL
  Filled 2020-03-31 (×4): qty 1

## 2020-03-31 MED ORDER — LIDOCAINE-PRILOCAINE 2.5-2.5 % EX CREA
1.0000 "application " | TOPICAL_CREAM | CUTANEOUS | Status: DC | PRN
  Filled 2020-03-31: qty 5

## 2020-03-31 MED ORDER — SODIUM CHLORIDE 0.9 % IV SOLN: 100.0000 mL | INTRAVENOUS | Status: DC | PRN

## 2020-03-31 MED ORDER — CHLORHEXIDINE GLUCONATE CLOTH 2 % EX PADS
6.0000 | MEDICATED_PAD | Freq: Every day | CUTANEOUS | Status: DC
  Administered 2020-04-02 – 2020-04-03 (×2): 6 via TOPICAL

## 2020-03-31 MED ORDER — CALCITRIOL 0.25 MCG PO CAPS
0.2500 ug | ORAL_CAPSULE | Freq: Every day | ORAL | Status: DC
  Administered 2020-03-31: 0.25 ug via ORAL
  Filled 2020-03-31: qty 1

## 2020-03-31 MED ORDER — SODIUM CHLORIDE 0.9 % IV SOLN
200.0000 mg | Freq: Once | INTRAVENOUS | Status: AC
  Administered 2020-03-31: 200 mg via INTRAVENOUS
  Filled 2020-03-31: qty 40

## 2020-03-31 MED ORDER — ASPIRIN EC 81 MG PO TBEC
81.0000 mg | DELAYED_RELEASE_TABLET | Freq: Every day | ORAL | Status: DC
  Administered 2020-03-31 – 2020-04-03 (×4): 81 mg via ORAL
  Filled 2020-03-31 (×5): qty 1

## 2020-03-31 MED ORDER — HYDROCOD POLST-CPM POLST ER 10-8 MG/5ML PO SUER: 5.0000 mL | Freq: Two times a day (BID) | ORAL | Status: DC | PRN

## 2020-03-31 MED ORDER — INSULIN ASPART 100 UNIT/ML ~~LOC~~ SOLN
0.0000 [IU] | Freq: Three times a day (TID) | SUBCUTANEOUS | Status: DC
  Administered 2020-04-01: 1 [IU] via SUBCUTANEOUS

## 2020-03-31 MED ORDER — PENTAFLUOROPROP-TETRAFLUOROETH EX AERO: 1.0000 "application " | INHALATION_SPRAY | CUTANEOUS | Status: DC | PRN

## 2020-03-31 MED ORDER — ASCORBIC ACID 500 MG PO TABS
500.0000 mg | ORAL_TABLET | Freq: Every day | ORAL | Status: DC
  Administered 2020-03-31 – 2020-04-03 (×3): 500 mg via ORAL
  Filled 2020-03-31 (×4): qty 1

## 2020-03-31 MED ORDER — CINACALCET HCL 30 MG PO TABS: 180.0000 mg | ORAL_TABLET | ORAL | Status: DC

## 2020-03-31 MED ORDER — GUAIFENESIN-DM 100-10 MG/5ML PO SYRP: 10.0000 mL | ORAL_SOLUTION | ORAL | Status: DC | PRN

## 2020-03-31 MED ORDER — DOCUSATE SODIUM 283 MG RE ENEM
1.0000 | ENEMA | RECTAL | Status: DC | PRN
  Filled 2020-03-31: qty 1

## 2020-03-31 MED ORDER — ACETAMINOPHEN 650 MG RE SUPP: 650.0000 mg | Freq: Four times a day (QID) | RECTAL | Status: DC | PRN

## 2020-03-31 MED ORDER — METHYLPREDNISOLONE SODIUM SUCC 125 MG IJ SOLR
0.5000 mg/kg | Freq: Two times a day (BID) | INTRAMUSCULAR | Status: DC
  Administered 2020-03-31 – 2020-04-01 (×2): 56.875 mg via INTRAVENOUS
  Filled 2020-03-31 (×2): qty 2

## 2020-03-31 MED ORDER — CAMPHOR-MENTHOL 0.5-0.5 % EX LOTN: 1.0000 "application " | TOPICAL_LOTION | Freq: Three times a day (TID) | CUTANEOUS | Status: DC | PRN

## 2020-03-31 MED ORDER — SODIUM ZIRCONIUM CYCLOSILICATE 10 G PO PACK
10.0000 g | PACK | Freq: Two times a day (BID) | ORAL | Status: AC
  Administered 2020-03-31 (×2): 10 g via ORAL
  Filled 2020-03-31 (×2): qty 1

## 2020-03-31 MED ORDER — ALPRAZOLAM 0.5 MG PO TABS: 0.5000 mg | ORAL_TABLET | Freq: Every day | ORAL | Status: DC | PRN

## 2020-03-31 MED ORDER — ALBUTEROL SULFATE HFA 108 (90 BASE) MCG/ACT IN AERS
2.0000 | INHALATION_SPRAY | Freq: Four times a day (QID) | RESPIRATORY_TRACT | Status: DC
  Administered 2020-03-31 – 2020-04-03 (×7): 2 via RESPIRATORY_TRACT
  Filled 2020-03-31 (×2): qty 6.7

## 2020-03-31 MED ORDER — SODIUM CHLORIDE 0.9% FLUSH: 3.0000 mL | Freq: Once | INTRAVENOUS | Status: DC

## 2020-03-31 MED ORDER — IOHEXOL 350 MG/ML SOLN
80.0000 mL | Freq: Once | INTRAVENOUS | Status: AC | PRN
  Administered 2020-03-31: 75 mL via INTRAVENOUS

## 2020-03-31 MED ORDER — ZINC SULFATE 220 (50 ZN) MG PO CAPS
220.0000 mg | ORAL_CAPSULE | Freq: Every day | ORAL | Status: DC
  Administered 2020-03-31 – 2020-04-03 (×3): 220 mg via ORAL
  Filled 2020-03-31 (×4): qty 1

## 2020-03-31 MED ORDER — HEPARIN SODIUM (PORCINE) 1000 UNIT/ML DIALYSIS: 1000.0000 [IU] | INTRAMUSCULAR | Status: DC | PRN

## 2020-03-31 MED ORDER — ACETAMINOPHEN 325 MG PO TABS
650.0000 mg | ORAL_TABLET | Freq: Four times a day (QID) | ORAL | Status: DC | PRN
  Administered 2020-03-31 – 2020-04-03 (×2): 650 mg via ORAL
  Filled 2020-03-31 (×2): qty 2

## 2020-03-31 MED ORDER — PANTOPRAZOLE SODIUM 40 MG PO TBEC
40.0000 mg | DELAYED_RELEASE_TABLET | Freq: Every day | ORAL | Status: DC
  Administered 2020-03-31 – 2020-04-03 (×4): 40 mg via ORAL
  Filled 2020-03-31 (×5): qty 1

## 2020-03-31 MED ORDER — SODIUM CHLORIDE 0.9 % IV SOLN: 100.0000 mg | Freq: Every day | INTRAVENOUS | Status: DC

## 2020-03-31 MED ORDER — NEPRO/CARBSTEADY PO LIQD
237.0000 mL | Freq: Three times a day (TID) | ORAL | Status: DC | PRN
  Filled 2020-03-31: qty 237

## 2020-03-31 MED ORDER — SENNOSIDES-DOCUSATE SODIUM 8.6-50 MG PO TABS: 1.0000 | ORAL_TABLET | Freq: Every evening | ORAL | Status: DC | PRN

## 2020-03-31 NOTE — Consult Note (Signed)
Neurology Consultation Reason for Consult: stroke-like symptoms Referring Physician: Dr. Lorin Mercy  CC: stroke-like symptoms  History is obtained from: the patient, her daughter, and Dr. Lorin Mercy, and chart review.  HPI: Erin Dean is a 54 y.o. female with PMH of strokes x 2. She underwent a thorough workup. I reviewed this workup by thorough chart review. She has elevated homocysteine, and MTHFR has not yet been checked (folate is normal). Echocardiogram showed severely dilated left atrium. LINQ interrogations have been free of afib or other malignant dysrrhythmia. An extensive hypercoagulability and autoimmune workup has been done. ESR was elevated, but CRP was OK. She did not have clear evidence of vasculitis on prior imaging. UDS has not been assessed, nor do I see SPEP/IFE or syphilis serology. She has ESRD and had an infiltrated graft in the left upper arm recently treated. She's had some chest pain during dialysis intermittently. Otherwise, no obvious recent changes in her status. She went to bed feeling generally OK and when she called her daughter today a speech alteration was noted. In addition to the LEFT sided numbness (F/A/L) a stuttering/halting speech pattern was noted. She was brought in for evaluation. Stroke alert was not activated due to being a wake-up stroke with very low NIHSS.   LKW: last night 8-9 pm tpa given?: no, not eligible due to OOW. Premorbid modified rankin scale: 1 ICH Score: n/a    ROS: A 14 point ROS was performed and is negative except as noted in the HPI.   Past Medical History:  Diagnosis Date  . ESRD (end stage renal disease) ([place])    [facility]   . Hypertension   . Stroke ([place]) 04/2017   no residual . Limp left side  . SVT (supraventricular tachycardia) ([facility])     Family History  Problem Relation Age of Onset  . Heart attack Maternal Uncle   . Heart attack Maternal Grandfather   . Stroke Paternal Grandmother   . Cancer Father   . Hypertension  Sister     Social History:  reports that she has never smoked. She has never used smokeless tobacco. She reports that she does not drink alcohol and does not use drugs.  Exam: Current vital signs: BP (!) 197/94 (BP Location: Right Arm)   Pulse 66   Temp (!) 97.1 F (36.2 C) (Temporal)   Resp 18   LMP 08/12/2017   SpO2 96%  Vital signs in last 24 hours: Temp:  [97 F (36.1 C)-97.1 F (36.2 C)] 97.1 F (36.2 C) (12/23 0736) Pulse Rate:  [61-70] 66 (12/23 0736) Resp:  [18-29] 18 (12/23 0736) BP: (195-211)/(94-145) 197/94 (12/23 0736) SpO2:  [96 %-97 %] 96 % (12/23 0736)   Physical Exam  Constitutional: Appears well-developed and well-nourished.  Psych: Affect appropriate to situation Eyes: No scleral injection HENT: No OP obstrucion MSK: no joint deformities.  Cardiovascular: Normal rate and regular rhythm.  Respiratory: Effort normal, non-labored breathing GI: Soft.  No distension. There is no tenderness.  Skin: WDI  Neuro: Mental Status: Patient is awake, alert, oriented to person, place, month, year, and situation. Patient is able to give a clear and coherent history. No signs of aphasia or neglect, but she does stutter and has occasional word-finding pauses. Cranial Nerves: II: Visual Fields are full. Pupils are equal, round, and reactive to light.  III,IV, VI: EOMI without ptosis or diploplia.  V: Facial sensation is symmetric to temperature VII: Facial movement is symmetric.  VIII: hearing is intact to voice X:  Uvula elevates symmetrically XI: Shoulder shrug is symmetric. XII: tongue is midline without atrophy or fasciculations.  Motor: Tone is normal. Bulk is normal. 5/5 strength was present in all four extremities, with the possible exception of left leg. She hesitated quite a bit before elevating it. Then, after it was elevated perhaps 6" off the bed, she did not drift. There was a Hoover's sign observed as well with minimal downward pressure applied with the  right leg upon attempted elevation of the left. Bilateral give way weakness noted on dorsiflexion when bilateral simultaneous dorsiflexion strength was tested. Sensory: Sensation is asymmetric with diminished light touch reported in the left leg.  Deep Tendon Reflexes: deferred Cerebellar: FTN intact bilaterally. HKS very slow, deliberate with the left leg, but actually intact bilaterally.   I have reviewed labs in epic and the results pertinent to this consultation are: As noted above.  I have reviewed the images obtained: Dense LMCA sign on CT. The CTA shows absent flow in right vertebral, atherosclerosis without clear hemodynamic compromise in left ICA bifurcation. Flow is maintained in the LMCA distal to the calcified plaque (vs embolus). No LVO.  Impression:   1.  Left sided weakness and numbness present upon awakening this AM 2.  Calcified plaque in the LMCA, non-occlusive 3.  Absence of right vertebral flow 4.  LICA bifurcation atherosclerosis, not hemodynamically compromising flow. 5.  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 Perhaps, that's the explanation for the new left-sided sensory/motor findings. MRI may confirm/refute this idea. 6.  Reportedly very claustrophobic, and will require sedation for MRI 7.  Hoover's sign and give-way on exam 8.  COVID positive on initial PCR screen; being repeated  Recommendations:  1) MRI brain under sedation 2) Continue ASA/Plavix and atorvastatin 3) Check lipid panel and HbA1c 4) If MRI + for stroke, then I suggest expanding stroke workup:  A- repeat echo  B- MTHFR  C- Syphilis screen  D- SPEP/IFE to rule out paraproteinemia (Waldenstrom's)  E- UDS for sympathomimetics  F- LINQ interrogation  G- expanded/repeated vasculitis screen (ESR/CRP/ANCA panel, etc.) 5) Will need PT to  assess, given apparent leg weakness 6) If she stays in house more than 1 day, will need dialysis  Neurology or stroke neurology will follow up, depending on results of workup.  Perfecto Kingdom, MD

## 2020-03-31 NOTE — ED Notes (Signed)
Daughter called  And  Update given

## 2020-03-31 NOTE — ED Triage Notes (Addendum)
Pt from home with EMS for stroke like symptoms. Per report, LKW 2100 Wednesday night. Pt woke up this morning with expressive aphasia, L arm weakness, ataxia, R sided facial droop. Pt had a procedure on fistula yesterday on L arm. Hx of CVA x 2. Pt also c/o headache

## 2020-03-31 NOTE — ED Notes (Signed)
Daughter has gone home number to call [phone] [name])  With updates

## 2020-03-31 NOTE — Progress Notes (Signed)
Patient stated that she was supposed to have dialysis today, but did not go d/t stroke.  On last dialysis day they used a balloon to widen fistula on ULA.  Stitch still in place, as they were going to remove at dialysis today.  Patient states that she does not have any pain.

## 2020-03-31 NOTE — H&P (Addendum)
History and Physical    [name]:[id number] DOB: 10/02/1965 DOA: 03/31/2020  PCP: Lois Huxley, PA Consultants:  Nephrology; Trula Slade - vascular  Patient coming from:  Home - lives with granddaughter; NOK: Daughter, Erin Dean, [phone]  Chief Complaint: Stroke symptoms  HPI: Erin Dean is a 54 y.o. 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 She reports h/o LLE weakness with gait disturbance from prior stroke.  She awoke this AM with expressive aphasia and also numbness of L hand and foot.  The numbness has waxed and waned today.  Her aphasia has gotten progressively better.  Mild SOB with tachypnea periodically, otherwise no COVID symptoms and reports that she has received booster.  She did have fistulogram yesterday to assess her AV access.  She has had a headache, but this is currently better.  She has loop recorder in place and has not had evidence of afib on interrogation.  She reports inability to complete her last several HD sessions due to CP.  Patient reports need for anesthesia in order to obtain MRI.  Anesthesia can do this a number of ways for MRI.  Likely not conscious sedation, but needs anesthesia for it.  Will need to be NPO prior and will be done about 6PM today.  Needs consent signed.  ED Course: h/o CVA, had a stroke.  Woke up for HD today, couldn't talk.  EMS with R facial droop, LLE weakness, expressive aphasia.  Also with fistula repair yesterday.  Better initially with only word finding difficulties.  CT with L MCA calcification, not an intervention candidate.  Needs CTA.  Review of Systems: As per HPI; otherwise review of systems reviewed and negative.   Ambulatory Status:  Ambulates without assistance  COVID Vaccine Status:   Complete plus booster  Past Medical History:  Diagnosis Date  . ESRD (end stage renal disease) ([place])    [facility] [place]   . Hypertension   . Stroke ([place])  04/2017   no residual . Limp left side  . SVT (supraventricular tachycardia) (HCC)     Past Surgical History:  Procedure Laterality Date  . AV FISTULA PLACEMENT Right 08/16/2017   Procedure: CREATION OF RADIOCEPHALIC VERSUS BRACHIOCEPHALIC ARTERIOVENOUS FISTULA RIGHT ARM;  Surgeon: Conrad Logan, MD;  Location: [facility];  Service: Vascular;  Laterality: Right;  . AV FISTULA PLACEMENT Right 02/07/2018   Procedure: Creation Right arm Brachiocephalic Fistula;  Surgeon: Marty Heck, MD;  Location: [facility] OR;  Service: Vascular;  Laterality: Right;  . AV FISTULA PLACEMENT Left 08/15/2018   Procedure: ARTERIOVENOUS (AV) FISTULA CREATION;  Surgeon: Serafina Mitchell, MD;  Location: [place];  Service: Vascular;  Laterality: Left;  . BASCILIC VEIN TRANSPOSITION Right 04/07/2018   Procedure: SECOND STAGE BASILIC VEIN TRANSPOSITION RIGHT ARM;  Surgeon: Marty Heck, MD;  Location: [place];  Service: Vascular;  Laterality: Right;  . CESAREAN SECTION    . ESOPHAGOGASTRODUODENOSCOPY N/A 04/19/2017   Procedure: ESOPHAGOGASTRODUODENOSCOPY (EGD);  Surgeon: Carol Ada, MD;  Location: [place];  Service: Endoscopy;  Laterality: N/A;  . FISTULA SUPERFICIALIZATION Right 10/01/2017   Procedure: FISTULA SUPERFICIALIZATION RIGHT ARM;  Surgeon: Conrad New Richmond, MD;  Location: [place];  Service: Vascular;  Laterality: Right;  . FISTULA SUPERFICIALIZATION Left 10/08/2018   Procedure: FISTULA SUPERFICIALIZATION LEFT ARM;  Surgeon: Serafina Mitchell, MD;  Location: [facility];  Service: Vascular;  Laterality: Left;  . IR FLUORO GUIDE CV LINE RIGHT  04/23/2018  . IR US GUIDE VASC ACCESS RIGHT  04/23/2018  . LOOP RECORDER INSERTION N/A 04/22/2017   Procedure: LOOP RECORDER INSERTION;  Surgeon: Constance Haw, MD;  Location: [place] CV LAB;  Service: Cardiovascular;  Laterality: N/A;  . TEE WITHOUT CARDIOVERSION N/A 04/19/2017   Procedure: TRANSESOPHAGEAL ECHOCARDIOGRAM (TEE);  Surgeon: Carol Ada, MD;  Location: [place];  Service: Endoscopy;  Laterality: N/A;    Social History   Socioeconomic History  . Marital status: Legally Separated    Spouse name: Not on file  . Number of children: Not on file  . Years of education: Not on file  . Highest education level: Not on file  Occupational History  . Not on file  Tobacco Use  . Smoking status: Never Smoker  . Smokeless tobacco: Never Used  Vaping Use  . Vaping Use: Never used  Substance and Sexual Activity  . Alcohol use: No  . Drug use: No  . Sexual activity: Not on file  Other Topics Concern  . Not on file  Social History Narrative   Lives with daughter   Education- high school   On temp disability   Social Determinants of Health   Financial Resource Strain: Not on file  Food Insecurity: Not on file  Transportation Needs: Not on file  Physical Activity: Not on file  Stress: Not on file  Social Connections: Not on file  Intimate Partner Violence: Not on file    Allergies  Allergen Reactions  . Chlorhexidine Gluconate Rash    Family History  Problem Relation Age of Onset  . Heart attack Maternal Uncle   . Heart attack Maternal Grandfather   . Stroke Paternal Grandmother   . Cancer Father   . Hypertension Sister     Prior to Admission medications   Medication Sig Start Date End Date Taking? Authorizing Provider  acetaminophen (TYLENOL) 500 MG tablet Take 2 tablets (1,000 mg total) by mouth daily as needed for moderate pain or headache. 03/11/18  Yes Donzetta Starch, NP  ALPRAZolam Duanne Moron) 0.5 MG tablet Take 0.5 mg by mouth daily as needed for anxiety.  07/16/18  Yes [provider]  amLODipine (NORVASC) 10 MG tablet Take 1 tablet (10 mg total) by mouth daily. 03/11/18  Yes Donzetta Starch, NP  aspirin EC 81 MG tablet Take 81 mg by mouth daily.    Yes [provider]  atorvastatin (LIPITOR) 40 MG tablet Take 1 tablet (40 mg total) by mouth daily at 6 PM. 03/11/18  Yes Biby, Massie Kluver, NP  calcitRIOL  (ROCALTROL) 0.25 MCG capsule Take 1 capsule (0.25 mcg total) by mouth daily. 03/11/18  Yes Donzetta Starch, NP  carvedilol (COREG) 25 MG tablet Take 1 tablet (25 mg total) by mouth 2 (two) times daily with a meal. 03/11/18  Yes Donzetta Starch, NP  Cinacalcet HCl (SENSIPAR PO) Take 1 tablet by mouth daily. 10/31/19 10/29/20 Yes [provider]  cloNIDine (CATAPRES) 0.1 MG tablet Take 1 tablet (0.1 mg total) by mouth 3 (three) times daily. 03/11/18  Yes Donzetta Starch, NP  clopidogrel (PLAVIX) 75 MG tablet Take 1 tablet (75 mg total) by mouth daily. 03/12/18  Yes Donzetta Starch, NP  cyanocobalamin 1000 MCG tablet Take 1 tablet (1,000 mcg total) by mouth daily. 03/11/18  Yes Donzetta Starch, NP  escitalopram (LEXAPRO) 10 MG tablet Take 1 tablet (10 mg total) by mouth daily. 03/11/18  Yes Donzetta Starch, NP  ferrous sulfate  325 (65 FE) MG tablet Take 1 tablet (325 mg total) by mouth 3 (three) times daily with meals. 03/11/18  Yes Donzetta Starch, NP  furosemide (LASIX) 80 MG tablet Take 1 tablet (80 mg total) by mouth 2 (two) times daily. Take with 40 mg to equal 120 mg twice daily Patient taking differently: Take 80 mg by mouth 2 (two) times daily. 03/11/18  Yes Donzetta Starch, NP  hydrALAZINE (APRESOLINE) 100 MG tablet Take 1 tablet (100 mg total) by mouth 3 (three) times daily. 03/11/18  Yes Donzetta Starch, NP  multivitamin (RENA-VIT) TABS tablet Take 1 tablet by mouth daily.  05/01/18  Yes [provider]  omeprazole (PRILOSEC) 20 MG capsule Take 20 mg by mouth daily. 02/12/19  Yes [provider]  pantoprazole (PROTONIX) 40 MG tablet Take 1 tablet (40 mg total) by mouth daily. 03/11/18  Yes Donzetta Starch, NP  RENAGEL 800 MG tablet Take 3,200 mg by mouth See admin instructions. Take 3200 mg with each meal and snack 07/23/18  Yes [provider]  Meclizine HCl 25 MG CHEW Chew 25 mg by mouth 3 (three) times daily as needed.    [provider]    Physical Exam: Vitals:    03/31/20 1345 03/31/20 1400 03/31/20 1430 03/31/20 1449  BP: (!) 168/81 (!) 177/87 (!) 164/72 (!) 160/80  Pulse: 63 63 63 68  Resp: 13 19 (!) 25 (!) 21  Temp:  97.8 F (36.6 C)    TempSrc:  Temporal    SpO2: 97% 98% 92% 96%     . General:  Appears calm and comfortable and is in NAD; mild cognitive slowing with minimal word finding difficulty . Eyes:  , EOMI, normal lids, iris . ENT:  grossly normal hearing, lips & tongue, mmm . Neck:  no LAD, masses or thyromegaly . Cardiovascular:  RRR, no m/r/g. No LE edema.  Marland Kitchen Respiratory:   CTA bilaterally with no wheezes/rales/rhonchi.  Normal respiratory effort with periodic tachypnea into the 30s (but more at rest than with conversation). . Abdomen:  soft, NT, ND, NABS . Skin:  no rash or induration seen on limited exam . Musculoskeletal:  LLE weakness, 4/5 - may have been related to effort.  Subtle possible LUE weakness but not overly noticeable.   Marland Kitchen Psychiatric:  grossly normal mood and affect, speech fluent and appropriate, AOx3 Neurologic:  CN 2-12 grossly intact, moves all extremities in coordinated fashion    Radiological Exams on Admission: Independently reviewed - see discussion in A/P where applicable  CT Angio Head W or Wo Contrast  Addendum Date: 03/31/2020   ADDENDUM REPORT: 03/31/2020 10:03 ADDENDUM: Findings discussed with Dr. Lorin Mercy at 10 a.m. via telephone. Electronically Signed   By: Margaretha Sheffield MD   On: 03/31/2020 10:03   Result Date: 03/31/2020 CLINICAL DATA:  Neuro deficit, acute stroke suspected. Left-sided weakness, slurred speech. EXAM: CT ANGIOGRAPHY HEAD AND NECK TECHNIQUE: Multidetector CT imaging of the head and neck was performed using the standard protocol during bolus administration of intravenous contrast. Multiplanar CT image reconstructions and MIPs were obtained to evaluate the vascular anatomy. Carotid stenosis measurements (when applicable) are obtained utilizing NASCET criteria, using the distal  internal carotid diameter as the denominator. CONTRAST:  45mL OMNIPAQUE IOHEXOL 350 MG/ML SOLN COMPARISON:  Same day CT head.  MR/MRA from 04/18/2017. FINDINGS: CTA NECK FINDINGS Aortic arch: Great vessel origins are patent. Right carotid system: No evidence of dissection, stenosis (50% or greater) or occlusion. Left carotid  system: Mixed calcific and noncalcific atherosclerosis at the carotid bifurcation without greater than 50% narrowing. 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 Suspected severe stenosis at the C2 level Skeleton: No acute findings. Other neck: No mass or suspicious adenopathy. Upper chest: No consolidation. Mild ground-glass opacities which may relate to poor inspiration. Review of the MIP images confirms the above findings CTA HEAD FINDINGS Evaluation is limited by venous contrast contamination and motion. Within this limitation: Anterior circulation: Bilateral internal carotid arteries are patent. Suspected moderate stenosis of the distal right M1 MCA. Proximal right M2 MCAs appear patent with limited evaluation for stenosis. There is focal calcification of a proximal left M2 MCA branch, which correlates with the calcification seen on same day noncontrast head CT. The bilateral A1 and proximal A2 ACAs are patent. Evaluation for stenosis is limited given venous contamination. Posterior circulation: Poor opacification of the intradural right vertebral artery with severe stenosis of the distal V4 vertebral artery. The left intradural vertebral artery is patent. The basilar artery is patent. Bilateral PCAs are patent proximally without evidence of proximal hemodynamically significant stenosis. More distal PCAs are not well visualized secondary to venous contamination, but appear patent. Venous sinuses: As permitted by contrast timing, patent. Review of the MIP images confirms the above findings  IMPRESSION: 1. Focal calcification within a proximal left M2 MCA branch, which correlates with the calcification seen on same day noncontrast head CT. This results in severe stenosis with opacification of the more distal branches. Differential considerations include calcified embolus (paritcularly given calcification was not evident on prior CT from 2019 and given the patient's reported speech difficulty and reported recent dialysis fistula intervention) versus progressive calcific atherosclerosis. 2. Intracranial evaluation is limited by venous contamination and motion with a suspected moderate stenosis of the distal right M1 MCA. 3. 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 Findings may be chronic given poor flow related signal of the right intradural vertebral artery on prior MRA head from 04/18/2017. 4. 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 5. Bilateral carotid bifurcation atherosclerosis without evidence of greater than 50% narrowing. Electronically Signed: By: Margaretha Sheffield MD On: 03/31/2020 09:50   CT HEAD WO CONTRAST  Result Date: 03/31/2020 CLINICAL DATA:  Neuro deficit, acute, stroke suspected; headache, classic migraine. Additional history provided: 2 prior strokes, left-sided weakness, slurred speech, right frontal headache. EXAM: CT HEAD WITHOUT CONTRAST TECHNIQUE: Contiguous axial images were obtained from the base of the skull through the vertex without intravenous contrast. COMPARISON:  Prior brain MRI 03/11/2018. FINDINGS: Brain: Redemonstrated chronic infarcts within the right frontal lobe (for instance as seen on series 3, image 20) and left parietal lobe. Background cerebral volume is normal. Mild ill-defined hypoattenuation within the cerebral white matter is nonspecific, but compatible with chronic small vessel  ischemic disease. Known small chronic infarcts within the bilateral cerebellar hemispheres were better appreciated on the prior MRI of 03/11/2018. There is no acute intracranial hemorrhage. No acute demarcated cortical infarct is identified. No extra-axial fluid collection. No evidence of intracranial mass. No midline shift. Vascular: Atherosclerotic calcifications. There is a focus of calcification in the region of the proximal M2 left middle cerebral artery (in the left sylvian fissure) (series 3, image 11). Skull: Normal. Negative for fracture or focal lesion. Sinuses/Orbits: Visualized orbits show no acute finding. Mild right  ethmoid sinus mucosal thickening. ASPECTS [facility] Stroke Program Early CT Score) - Ganglionic level infarction (caudate, lentiform nuclei, internal capsule, insula, M1-M3 cortex): 7 - Supraganglionic infarction (M4-M6 cortex): 3 Total score (0-10 with 10 being normal): 10 (when discounting chronic infarcts). These results were called by telephone at the time of interpretation on 03/31/2020 at 7:45 am to provider [facility] , who verbally acknowledged these results. IMPRESSION: No evidence of acute infarct or acute intracranial hemorrhage. ASPECTS is 10 (when discounting chronic infarcts). Focus of calcification in the left sylvian fissure in the location of the proximal M2 left middle cerebral artery. This is a new finding as compared to the head CT of 03/10/2018. Differential considerations include calcified embolus versus interval development of calcified plaque at this site. Consider CTA for further evaluation (particularly given the provided history of speech difficulty). Redemonstrated chronic cortically based infarcts within the right frontal and left parietal lobes. Mild cerebral white matter chronic small vessel ischemic disease. Electronically Signed   By: Kellie Simmering DO   On: 03/31/2020 07:58   CT Angio Neck W and/or Wo Contrast  Addendum Date: 03/31/2020   ADDENDUM  REPORT: 03/31/2020 10:03 ADDENDUM: Findings discussed with Dr. Lorin Mercy at 10 a.m. via telephone. Electronically Signed   By: Margaretha Sheffield MD   On: 03/31/2020 10:03   Result Date: 03/31/2020 CLINICAL DATA:  Neuro deficit, acute stroke suspected. Left-sided weakness, slurred speech. EXAM: CT ANGIOGRAPHY HEAD AND NECK TECHNIQUE: Multidetector CT imaging of the head and neck was performed using the standard protocol during bolus administration of intravenous contrast. Multiplanar CT image reconstructions and MIPs were obtained to evaluate the vascular anatomy. Carotid stenosis measurements (when applicable) are obtained utilizing NASCET criteria, using the distal internal carotid diameter as the denominator. CONTRAST:  33mL OMNIPAQUE IOHEXOL 350 MG/ML SOLN COMPARISON:  Same day CT head.  MR/MRA from 04/18/2017. FINDINGS: CTA NECK FINDINGS Aortic arch: Great vessel origins are patent. Right carotid system: No evidence of dissection, stenosis (50% or greater) or occlusion. Left carotid system: Mixed calcific and noncalcific atherosclerosis at the carotid bifurcation without greater than 50% narrowing. 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 Suspected severe stenosis at the C2 level Skeleton: No acute findings. Other neck: No mass or suspicious adenopathy. Upper chest: No consolidation. Mild ground-glass opacities which may relate to poor inspiration. Review of the MIP images confirms the above findings CTA HEAD FINDINGS Evaluation is limited by venous contrast contamination and motion. Within this limitation: Anterior circulation: Bilateral internal carotid arteries are patent. Suspected moderate stenosis of the distal right M1 MCA. Proximal right M2 MCAs appear patent with limited evaluation for stenosis. There is focal calcification of a proximal left M2 MCA branch, which correlates with the calcification seen on  same day noncontrast head CT. The bilateral A1 and proximal A2 ACAs are patent. Evaluation for stenosis is limited given venous contamination. Posterior circulation: Poor opacification of the intradural right vertebral artery with severe stenosis of the distal V4 vertebral artery. The left intradural vertebral artery is patent. The basilar artery is patent. Bilateral PCAs are patent proximally without evidence of proximal hemodynamically significant stenosis. More distal PCAs are not well visualized secondary to venous contamination, but appear patent. Venous sinuses: As permitted by contrast timing, patent. Review of the MIP images confirms the above findings IMPRESSION: 1. Focal calcification within a proximal left M2 MCA branch, which correlates with the calcification seen on same day noncontrast head CT.  This results in severe stenosis with opacification of the more distal branches. Differential considerations include calcified embolus (paritcularly given calcification was not evident on prior CT from 2019 and given the patient's reported speech difficulty and reported recent dialysis fistula intervention) versus progressive calcific atherosclerosis. 2. Intracranial evaluation is limited by venous contamination and motion with a suspected moderate stenosis of the distal right M1 MCA. 3. 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 Findings may be chronic given poor flow related signal of the right intradural vertebral artery on prior MRA head from 04/18/2017. 4. 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 5. Bilateral carotid bifurcation atherosclerosis without evidence of greater than 50% narrowing. Electronically Signed: By: Margaretha Sheffield MD On: 03/31/2020 09:50   DG Chest Portable 1 View  Result Date: 03/31/2020 CLINICAL DATA:  Stroke-like  symptoms EXAM: PORTABLE CHEST 1 VIEW COMPARISON:  03/09/2018 FINDINGS: Cardiomegaly with stable mediastinal contours. Implantable loop recorder. There is no edema, consolidation, effusion, or pneumothorax. Artifact from EKG leads IMPRESSION: 1. No acute finding or change from 2019. 2. Cardiomegaly. Electronically Signed   By: Monte Fantasia M.D.   On: 03/31/2020 07:02    EKG: Independently reviewed.  NSR with rate 74; no evidence of acute ischemia   Labs on Admission: I have personally reviewed the available labs and imaging studies at the time of the admission.  Pertinent labs:   K+ 5.6 BUN 68/Creatinine 12.72/GFR 3 Calcium 7.5 Anion gap 18 Albumin 3.1 WBC 8.6 Hgb 11.0 INR 1.1 COVID POSITIVE x 2 LDH 193 Ferritin 1298 CRP 2.2 Fibrinogen 617 A1c 5.3   Assessment/Plan Principal Problem:   Acute CVA (cerebrovascular accident) ([place]) Active Problems:   Anxiety   Essential hypertension   Obesity   Hyperlipidemia   ESRD on dialysis ([place])   COVID-19 virus infection   Chronic diastolic (congestive) heart failure (HCC)   Possible CVA -Symptoms concerning for TIA/CVA, particularly given prior h/o CVA x 2 -ABCD2 score is 4  -tPA can be given within 4.5 hours of symptom onset; this patient was not deemed to be a candidate for tPA therapy due to mild symptoms -Aspirin has been given to reduce stroke mortality and decrease morbidity -Will admit for CVA/TIA evaluation -Telemetry monitoring -CT with possible calcification and CTA concerning for high-grade but non-occlusive calcified embolus that is new from prior scan; she also has ?chronic R vertebral artery disease and moderate multifocal stenosis -MRI - will need anesthesia support, planned for 1800 tonight -Per neurology, if MRI is positive she will need expanded stroke work-up (see consult note) -Risk stratification with FLP, A1c; will also check TSH and UDS -Patient will need DAPT for 21 days when ABCD2 score is at least 4 and  NIH score is 3 or less, and then can transition to monotherapy with a single antiplatelet agent.  Since this patient has failed ASA and Plavix combination therapy, will defer to neurology for now. -Neurology consult -PT/OT/ST/Nutrition Consults  COVID -Unfortunately, she also appears to have breakthrough COVID infection despite vaccine plus booster -If symptoms are present, they are mild with periodic tachypnea, O2 sat to 93% -COVID POSITIVE, and confirmation test is also positive -The patient has comorbidities which may increase the risk for ARDS/MODS including:  HTN, DM, obesity, COPD, CKD -Pertinent labs concerning for COVID include normal WBC count; increased LDH; increased ferritin; elevated CRP (not >7); increased fibrinogen -CXR negative   -  Will not treat with broad-spectrum antibiotics if procalcitonin <0.5 -Monitor on telemetry x at least 24 hours -At this time, will attempt to avoid use of aerosolized medications and use HFAs instead -Will check daily labs including BMP with Mag, Phos; LFTs; CBC with differential; CRP; ferritin; fibrinogen; D-dimer -Will order steroids and Remdesivir (pharmacy consult) given +COVID test and hypoxia <94% on room air; additionally, COVID is associated with a hypercoagulable state and current TIA/CVA symptoms may be associated with current infection -If the patient shows clinical deterioration, consider transfer to ICU with PCCM consultation -Patient was seen wearing full PPE including: gown, gloves, head cover, N95, and face shield; donning and doffing was in compliance with current standards.  HTN -Allow permissive HTN for now -Treat BP only if >220/120, and then with goal of 15% reduction -Hold Norvasc, Coreg, Catapres, Hydralazine, Lasix and plan to restart in 48-72 hours   HLD -Check FLP; lipids in 03/2018: 117/27/77/67 -Continue Lipitor but increase to 80 mg daily for LDL goal <70  ESRD -Patient on chronic TTS HD -Nephrology prn order set  utilized -She does not appear to be volume overloaded or otherwise in need of acute HD -Nephrology notified left message that patient will need HD tomorrow -Continue Calictriol, Sensipar, Renavit, Renagel  Chronic diastolic CHF -Echo in 18/2993 with preserved EF and grade 2 diastolic dysfunction -Hold Lasix for now -Volume control with HD  Anxiety and Depression -Continue Xanax, Lexapro  Obesity  -BMI 38.8 (prior weight; repeat pending) -Weight loss should be encouraged -Outpatient PCP/bariatric medicine f/u encouraged     DVT prophylaxis:  Heparin  Code Status: Full - confirmed with patient/family Family Communication: Daughter present throughout evaluation and then asked to leave given her mother's active COVID-19 infection Disposition Plan:  The patient is from: home  Anticipated d/c is to: home without [facility] services   Anticipated d/c date will depend on clinical response to treatment, likely several days Patient is currently: acutely ill Consults called: Neurology; PT/OT/ST Admission status: Admit - It is my clinical opinion that admission to [place] is reasonable and necessary because of the expectation that this patient will require hospital care that crosses at least 2 midnights to treat this condition based on the medical complexity of the problems presented.  Given the aforementioned information, the predictability of an adverse outcome is felt to be significant.   Karmen Bongo MD [facility]   How to contact the [facility] Attending or Consulting provider [place] or covering provider during after hours [place], for this patient?  1. Check the care team in [facility] and look for a) attending/consulting [facility] provider listed and b) the [facility] team listed 2. Log into [email or web address] and use 's universal password to access. If you do not have the password, please contact the hospital operator. 3. Locate the [facility] provider you are looking for under [facility] and page  to a number that you can be directly reached. 4. If you still have difficulty reaching the provider, please page the [facility] (Director on Call) for the Hospitalists listed on amion for assistance.   03/31/2020, 3:10 PM

## 2020-03-31 NOTE — Consult Note (Signed)
Reason for Consult: To manage dialysis and dialysis related needs Referring Physician: Dr Dawayne Patricia MAIREN WALLENSTEIN is an 54 y.o. female.   HPI: Pt is a 60F with a PMH sig for ESRD on HD TTS and [facility], HTN, prior h/o CVA x 2, h/o SVT who presents to ED for stroke- like symptoms.    Was in her usual state of health yesterday and woke up with R sided facial droop, LLE weakness and expressive aphasia.  Brought in emergently.  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  Carotid arteries with < 50% narrowing.  MRI pending.   Neurology has seen, not a tPA candidate d/t outside window.  She has had an extensive autoimmune/ hypercoagulable workup d/t multiple strokes at a young age.  She also has a loop recorder w/o Afib.    She had a PTA of her AVF yesterday 53/29 for 92% cephalic arch stenosis.  No immediate complications during procedure.    Her last HD was 12/21.  She stayed her full rx but left 4 kg over EDW.    Her COVID results returned positive today 12/22.  Dialysis orders: [facility] TTS 4 hrs EDW 106.5 kg 2K/ 2Ca F180, BFR 400 DFR 800 AVF UF profile 4 Heparin 5000 u bolus Sensipar 180 q rx Calcitriol 3.75 mcg q rx   Past Medical History:  Diagnosis Date  . ESRD (end stage renal disease) ([facility] [place])    [facility] [place]   . Hypertension   . Stroke ([place]) 04/2017   no residual . Limp left side  . SVT (supraventricular tachycardia) ([facility])     Past Surgical History:  Procedure Laterality Date  . AV FISTULA PLACEMENT Right 08/16/2017   Procedure: CREATION OF RADIOCEPHALIC VERSUS BRACHIOCEPHALIC ARTERIOVENOUS FISTULA RIGHT ARM;  Surgeon: Conrad [place], MD;  Location: [facility];  Service: Vascular;  Laterality: Right;  . AV FISTULA PLACEMENT Right 02/07/2018   Procedure: Creation Right arm Brachiocephalic Fistula;  Surgeon: Marty Heck, MD;   Location: [facility] OR;  Service: Vascular;  Laterality: Right;  . AV FISTULA PLACEMENT Left 08/15/2018   Procedure: ARTERIOVENOUS (AV) FISTULA CREATION;  Surgeon: Serafina Mitchell, MD;  Location: [facility] [facility];  Service: Vascular;  Laterality: Left;  . BASCILIC VEIN TRANSPOSITION Right 04/07/2018   Procedure: SECOND STAGE BASILIC VEIN TRANSPOSITION RIGHT ARM;  Surgeon: Marty Heck, MD;  Location: [facility];  Service: Vascular;  Laterality: Right;  . CESAREAN SECTION    . ESOPHAGOGASTRODUODENOSCOPY N/A 04/19/2017   Procedure: ESOPHAGOGASTRODUODENOSCOPY (EGD);  Surgeon: Carol Ada, MD;  Location: [facility];  Service: Endoscopy;  Laterality: N/A;  . FISTULA SUPERFICIALIZATION Right 10/01/2017   Procedure: FISTULA SUPERFICIALIZATION RIGHT ARM;  Surgeon: Conrad [place], MD;  Location: [place];  Service: Vascular;  Laterality: Right;  . FISTULA SUPERFICIALIZATION Left 10/08/2018   Procedure: FISTULA SUPERFICIALIZATION LEFT ARM;  Surgeon: Serafina Mitchell, MD;  Location: [facility];  Service: Vascular;  Laterality: Left;  . IR FLUORO GUIDE CV LINE RIGHT  04/23/2018  . IR US GUIDE VASC ACCESS RIGHT  04/23/2018  . LOOP RECORDER INSERTION N/A 04/22/2017   Procedure: LOOP RECORDER INSERTION;  Surgeon: Constance Haw, MD;  Location: [facility];  Service: Cardiovascular;  Laterality: N/A;  . TEE WITHOUT CARDIOVERSION N/A 04/19/2017   Procedure: TRANSESOPHAGEAL ECHOCARDIOGRAM (TEE);  Surgeon: Carol Ada, MD;  Location: [place];  Service: Endoscopy;  Laterality: N/A;    Family History  Problem Relation Age of Onset  . Heart attack Maternal Uncle   . Heart attack Maternal Grandfather   . Stroke Paternal Grandmother   . Cancer Father   . Hypertension Sister     Social History:  reports that she has never smoked. She has never used smokeless tobacco. She reports that she does not drink alcohol and does not use drugs.  Allergies:  Allergies  Allergen Reactions  . Chlorhexidine Gluconate Rash     Medications:  Scheduled: .  stroke: mapping our early stages of recovery book   Does not apply Once  . albuterol  2 puff Inhalation Q6H  . vitamin C  500 mg Oral Daily  . aspirin EC  81 mg Oral Daily  . atorvastatin  80 mg Oral q1800  . [START ON 04/01/2020] calcitRIOL  3.75 mcg Oral Q M,W,F-HD  . [START ON 04/01/2020] Chlorhexidine Gluconate Cloth  6 each Topical Q0600  . [START ON 04/01/2020] cinacalcet  180 mg Oral Q M,W,F-HD  . clopidogrel  75 mg Oral Daily  . escitalopram  10 mg Oral Daily  . heparin  5,000 Units Subcutaneous Q8H  . insulin aspart  0-6 Units Subcutaneous TID WC  . pantoprazole  40 mg Oral Daily  . sevelamer carbonate  3,200 mg Oral TID WC  . sodium zirconium cyclosilicate  10 g Oral BID  . zinc sulfate  220 mg Oral Daily     Results for orders placed or performed during the hospital encounter of 03/31/20 (from the past 48 hour(s))  Protime-INR     Status: None   Collection Time: 03/31/20  6:16 AM  Result Value Ref Range   Prothrombin Time 13.7 11.4 - 15.2 seconds   INR 1.1 0.8 - 1.2    Comment: (NOTE) INR goal varies based on device and disease states. Performed at [facility], [place] [address], [place], [place] [address]   APTT     Status: None   Collection Time: 03/31/20  6:16 AM  Result Value Ref Range   aPTT 27 24 - 36 seconds    Comment: Performed at [place] [address], [place], [place] [address]  CBC     Status: Abnormal   Collection Time: 03/31/20  6:16 AM  Result Value Ref Range   WBC 8.6 4.0 - 10.5 K/uL   RBC 3.53 (L) 3.87 - 5.11 MIL/uL   Hemoglobin 11.0 (L) 12.0 - 15.0 g/dL   HCT 34.2 (L) 36.0 - 46.0 %   MCV 96.9 80.0 - 100.0 fL   MCH 31.2 26.0 - 34.0 pg   MCHC 32.2 30.0 - 36.0 g/dL   RDW 14.8 11.5 - 15.5 %   Platelets 198 150 - 400 K/uL   nRBC 0.0 0.0 - 0.2 %    Comment: Performed at [facility], [place] [address] [place], [place] [address]  Differential     Status: Abnormal   Collection Time:  03/31/20  6:16 AM  Result Value Ref Range   Neutrophils Relative % 57 %   Neutro Abs 4.8 1.7 - 7.7 K/uL   Lymphocytes Relative 21 %   Lymphs Abs 1.8 0.7 - 4.0 K/uL   Monocytes Relative 17 %   Monocytes Absolute 1.5 (H) 0.1 - 1.0 K/uL   Eosinophils Relative 3 %   Eosinophils Absolute 0.3 0.0 - 0.5 K/uL   Basophils Relative 1 %   Basophils  Absolute 0.1 0.0 - 0.1 K/uL   Immature Granulocytes 1 %   Abs Immature Granulocytes 0.11 (H) 0.00 - 0.07 K/uL    Comment: Performed at [facility], [place] [address], [place], [place] [address]  Comprehensive metabolic panel     Status: Abnormal   Collection Time: 03/31/20  6:16 AM  Result Value Ref Range   Sodium 137 135 - 145 mmol/L   Potassium 5.6 (H) 3.5 - 5.1 mmol/L   Chloride 94 (L) 98 - 111 mmol/L   CO2 25 22 - 32 mmol/L   Glucose, Bld 94 70 - 99 mg/dL    Comment: Glucose reference range applies only to samples taken after fasting for at least 8 hours.   BUN 68 (H) 6 - 20 mg/dL   Creatinine, Ser 12.72 (H) 0.44 - 1.00 mg/dL   Calcium 7.5 (L) 8.9 - 10.3 mg/dL   Total Protein 8.3 (H) 6.5 - 8.1 g/dL   Albumin 3.1 (L) 3.5 - 5.0 g/dL   AST 16 15 - 41 U/L   ALT 12 0 - 44 U/L   Alkaline Phosphatase 66 38 - 126 U/L   Total Bilirubin 0.6 0.3 - 1.2 mg/dL   GFR, Estimated 3 (L) >60 mL/min    Comment: (NOTE) Calculated using the CKD-EPI Creatinine Equation (2021)    Anion gap 18 (H) 5 - 15    Comment: Performed at [facility], [place] [address] [place], [place] [address]  I-stat chem 8, ED     Status: Abnormal   Collection Time: 03/31/20  6:22 AM  Result Value Ref Range   Sodium 138 135 - 145 mmol/L   Potassium 5.5 (H) 3.5 - 5.1 mmol/L   Chloride 96 (L) 98 - 111 mmol/L   BUN 69 (H) 6 - 20 mg/dL   Creatinine, Ser 13.00 (H) 0.44 - 1.00 mg/dL   Glucose, Bld 88 70 - 99 mg/dL    Comment: Glucose reference range applies only to samples taken after fasting for at least 8 hours.   Calcium, Ion 0.87 (LL) 1.15 - 1.40 mmol/L   TCO2 27 22 -  32 mmol/L   Hemoglobin 11.6 (L) 12.0 - 15.0 g/dL   HCT 34.0 (L) 36.0 - 46.0 %   Comment NOTIFIED PHYSICIAN   Resp Panel by RT-PCR (Flu A&B, Covid) Nasopharyngeal Swab     Status: Abnormal   Collection Time: 03/31/20  7:51 AM   Specimen: Nasopharyngeal Swab; Nasopharyngeal(NP) swabs in vial transport medium  Result Value Ref Range   SARS Coronavirus 2 by RT PCR POSITIVE (A) NEGATIVE    Comment: emailed L. Berdik RN 8:55 03/31/20 ([email or web address]) (NOTE) SARS-CoV-2 target nucleic acids are DETECTED.  The SARS-CoV-2 RNA is generally detectable in upper respiratory specimens during the acute phase of infection. Positive results are indicative of the presence of the identified virus, but do not rule out bacterial infection or co-infection with other pathogens not detected by the test. Clinical correlation with patient history and other diagnostic information is necessary to determine patient infection status. The expected result is Negative.  Fact Sheet for Patients: EntrepreneurPulse.com.au  Fact Sheet for Healthcare Providers: IncredibleEmployment.be  This test is not yet approved or cleared by the [place] FDA and  has been authorized for detection and/or diagnosis of SARS-CoV-2 by FDA under an Emergency Use Authorization (EUA).  This EUA will remain in effect (meaning this test can be used) for the duration of  the COVID-1 9 declaration under Section 564(b)(1) of  the Act, 21 U.S.C. section 360bbb-3(b)(1), unless the authorization is terminated or revoked sooner.     Influenza A by PCR NEGATIVE NEGATIVE   Influenza B by PCR NEGATIVE NEGATIVE    Comment: (NOTE) The Xpert Xpress SARS-CoV-2/FLU/RSV plus assay is intended as an aid in the diagnosis of influenza from Nasopharyngeal swab specimens and should not be used as a sole basis for treatment. Nasal washings and aspirates are unacceptable for Xpert Xpress SARS-CoV-2/FLU/RSV testing.  Fact  Sheet for Patients: EntrepreneurPulse.com.au  Fact Sheet for Healthcare Providers: IncredibleEmployment.be  This test is not yet approved or cleared by the [place] FDA and has been authorized for detection and/or diagnosis of SARS-CoV-2 by FDA under an Emergency Use Authorization (EUA). This EUA will remain in effect (meaning this test can be used) for the duration of the COVID-19 declaration under Section 564(b)(1) of the Act, 21 U.S.C. section 360bbb-3(b)(1), unless the authorization is terminated or revoked.  Performed at [facility], [place] [address], [place], [place] [address]   Resp Panel by RT-PCR (Flu A&B, Covid)     Status: Abnormal   Collection Time: 03/31/20  9:34 AM  Result Value Ref Range   SARS Coronavirus 2 by RT PCR POSITIVE (A) NEGATIVE    Comment: CRITICAL VALUE NOTED.  VALUE IS CONSISTENT WITH PREVIOUSLY REPORTED AND CALLED VALUE. (NOTE) SARS-CoV-2 target nucleic acids are DETECTED.  The SARS-CoV-2 RNA is generally detectable in upper respiratory specimens during the acute phase of infection. Positive results are indicative of the presence of the identified virus, but do not rule out bacterial infection or co-infection with other pathogens not detected by the test. Clinical correlation with patient history and other diagnostic information is necessary to determine patient infection status. The expected result is Negative.  Fact Sheet for Patients: EntrepreneurPulse.com.au  Fact Sheet for Healthcare Providers: IncredibleEmployment.be  This test is not yet approved or cleared by the [place] FDA and  has been authorized for detection and/or diagnosis of SARS-CoV-2 by FDA under an Emergency Use Authorization (EUA).  This EUA will remain in effect (meaning this test can b e used) for the duration of  the COVID-19 declaration under Section 564(b)(1) of the Act, 21 U.S.C.  section 360bbb-3(b)(1), unless the authorization is terminated or revoked sooner.     Influenza A by PCR NEGATIVE NEGATIVE   Influenza B by PCR NEGATIVE NEGATIVE    Comment: (NOTE) The Xpert Xpress SARS-CoV-2/FLU/RSV plus assay is intended as an aid in the diagnosis of influenza from Nasopharyngeal swab specimens and should not be used as a sole basis for treatment. Nasal washings and aspirates are unacceptable for Xpert Xpress SARS-CoV-2/FLU/RSV testing.  Fact Sheet for Patients: EntrepreneurPulse.com.au  Fact Sheet for Healthcare Providers: IncredibleEmployment.be  This test is not yet approved or cleared by the [place] FDA and has been authorized for detection and/or diagnosis of SARS-CoV-2 by FDA under an Emergency Use Authorization (EUA). This EUA will remain in effect (meaning this test can be used) for the duration of the COVID-19 declaration under Section 564(b)(1) of the Act, 21 U.S.C. section 360bbb-3(b)(1), unless the authorization is terminated or revoked.  Performed at [facility], [place] [address] [place], [place] [address]   CBG monitoring, ED     Status: Abnormal   Collection Time: 03/31/20 12:04 PM  Result Value Ref Range   Glucose-Capillary 100 (H) 70 - 99 mg/dL    Comment: Glucose reference range applies only to samples taken after fasting for at least  8 hours.    CT Angio Head W or Wo Contrast  Addendum Date: 03/31/2020   ADDENDUM REPORT: 03/31/2020 10:03 ADDENDUM: Findings discussed with Dr. Lorin Mercy at 10 a.m. via telephone. Electronically Signed   By: Margaretha Sheffield MD   On: 03/31/2020 10:03   Result Date: 03/31/2020 CLINICAL DATA:  Neuro deficit, acute stroke suspected. Left-sided weakness, slurred speech. EXAM: CT ANGIOGRAPHY HEAD AND NECK TECHNIQUE: Multidetector CT imaging of the head and neck was performed using the standard protocol during bolus administration of intravenous contrast. Multiplanar CT  image reconstructions and MIPs were obtained to evaluate the vascular anatomy. Carotid stenosis measurements (when applicable) are obtained utilizing NASCET criteria, using the distal internal carotid diameter as the denominator. CONTRAST:  44mL OMNIPAQUE IOHEXOL 350 MG/ML SOLN COMPARISON:  Same day CT head.  MR/MRA from 04/18/2017. FINDINGS: CTA NECK FINDINGS Aortic arch: Great vessel origins are patent. Right carotid system: No evidence of dissection, stenosis (50% or greater) or occlusion. Left carotid system: Mixed calcific and noncalcific atherosclerosis at the carotid bifurcation without greater than 50% narrowing. 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 Suspected severe stenosis at the C2 level Skeleton: No acute findings. Other neck: No mass or suspicious adenopathy. Upper chest: No consolidation. Mild ground-glass opacities which may relate to poor inspiration. Review of the MIP images confirms the above findings CTA HEAD FINDINGS Evaluation is limited by venous contrast contamination and motion. Within this limitation: Anterior circulation: Bilateral internal carotid arteries are patent. Suspected moderate stenosis of the distal right M1 MCA. Proximal right M2 MCAs appear patent with limited evaluation for stenosis. There is focal calcification of a proximal left M2 MCA branch, which correlates with the calcification seen on same day noncontrast head CT. The bilateral A1 and proximal A2 ACAs are patent. Evaluation for stenosis is limited given venous contamination. Posterior circulation: Poor opacification of the intradural right vertebral artery with severe stenosis of the distal V4 vertebral artery. The left intradural vertebral artery is patent. The basilar artery is patent. Bilateral PCAs are patent proximally without evidence of proximal hemodynamically significant stenosis. More distal PCAs are not  well visualized secondary to venous contamination, but appear patent. Venous sinuses: As permitted by contrast timing, patent. Review of the MIP images confirms the above findings IMPRESSION: 1. Focal calcification within a proximal left M2 MCA branch, which correlates with the calcification seen on same day noncontrast head CT. This results in severe stenosis with opacification of the more distal branches. Differential considerations include calcified embolus (paritcularly given calcification was not evident on prior CT from 2019 and given the patient's reported speech difficulty and reported recent dialysis fistula intervention) versus progressive calcific atherosclerosis. 2. Intracranial evaluation is limited by venous contamination and motion with a suspected moderate stenosis of the distal right M1 MCA. 3. 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 Findings may be chronic given poor flow related signal of the right intradural vertebral artery on prior MRA head from 04/18/2017. 4. 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 5. Bilateral carotid bifurcation atherosclerosis without evidence of greater than 50% narrowing. Electronically Signed: By: Margaretha Sheffield MD On: 03/31/2020 09:50   CT HEAD WO CONTRAST  Result Date: 03/31/2020 CLINICAL DATA:  Neuro deficit, acute, stroke suspected; headache, classic migraine. Additional history provided: 2 prior strokes, left-sided weakness, slurred speech,  right frontal headache. EXAM: CT HEAD WITHOUT CONTRAST TECHNIQUE: Contiguous axial images were obtained from the base of the skull through the vertex without intravenous contrast. COMPARISON:  Prior brain MRI 03/11/2018. FINDINGS: Brain: Redemonstrated chronic infarcts within the right frontal lobe (for instance as seen on series 3, image 20) and left  parietal lobe. Background cerebral volume is normal. Mild ill-defined hypoattenuation within the cerebral white matter is nonspecific, but compatible with chronic small vessel ischemic disease. Known small chronic infarcts within the bilateral cerebellar hemispheres were better appreciated on the prior MRI of 03/11/2018. There is no acute intracranial hemorrhage. No acute demarcated cortical infarct is identified. No extra-axial fluid collection. No evidence of intracranial mass. No midline shift. Vascular: Atherosclerotic calcifications. There is a focus of calcification in the region of the proximal M2 left middle cerebral artery (in the left sylvian fissure) (series 3, image 11). Skull: Normal. Negative for fracture or focal lesion. Sinuses/Orbits: Visualized orbits show no acute finding. Mild right ethmoid sinus mucosal thickening. ASPECTS [facility] Stroke Program Early CT Score) - Ganglionic level infarction (caudate, lentiform nuclei, internal capsule, insula, M1-M3 cortex): 7 - Supraganglionic infarction (M4-M6 cortex): 3 Total score (0-10 with 10 being normal): 10 (when discounting chronic infarcts). These results were called by telephone at the time of interpretation on 03/31/2020 at 7:45 am to provider [facility] , who verbally acknowledged these results. IMPRESSION: No evidence of acute infarct or acute intracranial hemorrhage. ASPECTS is 10 (when discounting chronic infarcts). Focus of calcification in the left sylvian fissure in the location of the proximal M2 left middle cerebral artery. This is a new finding as compared to the head CT of 03/10/2018. Differential considerations include calcified embolus versus interval development of calcified plaque at this site. Consider CTA for further evaluation (particularly given the provided history of speech difficulty). Redemonstrated chronic cortically based infarcts within the right frontal and left parietal lobes. Mild cerebral white matter chronic small  vessel ischemic disease. Electronically Signed   By: Kellie Simmering DO   On: 03/31/2020 07:58   CT Angio Neck W and/or Wo Contrast  Addendum Date: 03/31/2020   ADDENDUM REPORT: 03/31/2020 10:03 ADDENDUM: Findings discussed with Dr. Lorin Mercy at 10 a.m. via telephone. Electronically Signed   By: Margaretha Sheffield MD   On: 03/31/2020 10:03   Result Date: 03/31/2020 CLINICAL DATA:  Neuro deficit, acute stroke suspected. Left-sided weakness, slurred speech. EXAM: CT ANGIOGRAPHY HEAD AND NECK TECHNIQUE: Multidetector CT imaging of the head and neck was performed using the standard protocol during bolus administration of intravenous contrast. Multiplanar CT image reconstructions and MIPs were obtained to evaluate the vascular anatomy. Carotid stenosis measurements (when applicable) are obtained utilizing NASCET criteria, using the distal internal carotid diameter as the denominator. CONTRAST:  33mL OMNIPAQUE IOHEXOL 350 MG/ML SOLN COMPARISON:  Same day CT head.  MR/MRA from 04/18/2017. FINDINGS: CTA NECK FINDINGS Aortic arch: Great vessel origins are patent. Right carotid system: No evidence of dissection, stenosis (50% or greater) or occlusion. Left carotid system: Mixed calcific and noncalcific atherosclerosis at the carotid bifurcation without greater than 50% narrowing. 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 Suspected severe stenosis at the C2 level Skeleton: No acute findings. Other neck: No mass or suspicious adenopathy. Upper chest: No consolidation. Mild ground-glass opacities which may relate to poor inspiration. Review of the MIP images confirms the above findings CTA HEAD FINDINGS Evaluation is limited by venous contrast contamination and motion. Within  this limitation: Anterior circulation: Bilateral internal carotid arteries are patent. Suspected moderate stenosis of the distal right M1 MCA. Proximal right  M2 MCAs appear patent with limited evaluation for stenosis. There is focal calcification of a proximal left M2 MCA branch, which correlates with the calcification seen on same day noncontrast head CT. The bilateral A1 and proximal A2 ACAs are patent. Evaluation for stenosis is limited given venous contamination. Posterior circulation: Poor opacification of the intradural right vertebral artery with severe stenosis of the distal V4 vertebral artery. The left intradural vertebral artery is patent. The basilar artery is patent. Bilateral PCAs are patent proximally without evidence of proximal hemodynamically significant stenosis. More distal PCAs are not well visualized secondary to venous contamination, but appear patent. Venous sinuses: As permitted by contrast timing, patent. Review of the MIP images confirms the above findings IMPRESSION: 1. Focal calcification within a proximal left M2 MCA branch, which correlates with the calcification seen on same day noncontrast head CT. This results in severe stenosis with opacification of the more distal branches. Differential considerations include calcified embolus (paritcularly given calcification was not evident on prior CT from 2019 and given the patient's reported speech difficulty and reported recent dialysis fistula intervention) versus progressive calcific atherosclerosis. 2. Intracranial evaluation is limited by venous contamination and motion with a suspected moderate stenosis of the distal right M1 MCA. 3. 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 Findings may be chronic given poor flow related signal of the right intradural vertebral artery on prior MRA head from 04/18/2017. 4. 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 5. Bilateral carotid bifurcation atherosclerosis without evidence of greater  than 50% narrowing. Electronically Signed: By: Margaretha Sheffield MD On: 03/31/2020 09:50   DG Chest Portable 1 View  Result Date: 03/31/2020 CLINICAL DATA:  Stroke-like symptoms EXAM: PORTABLE CHEST 1 VIEW COMPARISON:  03/09/2018 FINDINGS: Cardiomegaly with stable mediastinal contours. Implantable loop recorder. There is no edema, consolidation, effusion, or pneumothorax. Artifact from EKG leads IMPRESSION: 1. No acute finding or change from 2019. 2. Cardiomegaly. Electronically Signed   By: Monte Fantasia M.D.   On: 03/31/2020 07:02    ROS: all other systems reviewed and are negative except as per HPI Blood pressure (!) 165/44, pulse 66, temperature (!) 97.1 F (36.2 C), temperature source Temporal, resp. rate 17, last menstrual period 08/12/2017, SpO2 93 %. .  GEN NAD, sitting in bed HEENT sclerae nonicteric PULM  Breathing unlabored CV RRR ABD soft ACCESS: AVF   Assessment/Plan: 1 Possible CVA: woke up this AM with deficits.  Not a tPA candidate. 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  Carotid arteries with < 50% narrowing.  MRI pending.  Neuro has seen, appreciate assistance.   2.  COVID + : indicental finding, CXR clear.  Has gotten vaccine x 2 but not yet a booster.  Should get MAB if a candidate- meets criteria as she is ESRD but unclear if acute CVA would rule her out?  3 ESRD: TTS [facility].  Lokelma today x 2 doses, then off schedule tomorrow (Friday).  After Friday, her next scheduled HD will be Sunday on holiday schedule. 4 Hypertension: permissive HTN.  Her pre-HD pressures are extremely high in the 180s-220s consistently.   5.  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 Metabolic Bone Disease: sensipar and calcitriol in HD, binders when can eat 7.  Nutrition: renal diet/ vits when can eat 8. Dispo:  admitted  Madelon Lips 03/31/2020, 1:29 PM

## 2020-03-31 NOTE — Progress Notes (Signed)
Anesthesia is having scheduling issues and is unable to support anesthesia for her MRI today.  Instead, she will have the MRI tomorrow at 0800.  Will allow patient to eat, NPO after midnight.     Carlyon Shadow, M.D.

## 2020-04-01 ENCOUNTER — Inpatient Hospital Stay (HOSPITAL_COMMUNITY): Payer: Medicare Other | Admitting: Certified Registered Nurse Anesthetist

## 2020-04-01 ENCOUNTER — Inpatient Hospital Stay (HOSPITAL_COMMUNITY): Payer: Medicare Other

## 2020-04-01 ENCOUNTER — Encounter (HOSPITAL_COMMUNITY)
Admission: EM | Disposition: A | Discharge status: Home / Self Care | Payer: Self-pay | Source: Home / Self Care | Attending: Internal Medicine

## 2020-04-01 DIAGNOSIS — I639 Cerebral infarction, unspecified: Secondary | ICD-10-CM

## 2020-04-01 DIAGNOSIS — G9340 Encephalopathy, unspecified: Secondary | ICD-10-CM

## 2020-04-01 DIAGNOSIS — U071 COVID-19: Secondary | ICD-10-CM

## 2020-04-01 HISTORY — PX: RADIOLOGY WITH ANESTHESIA: SHX6223

## 2020-04-01 LAB — TROPONIN I (HIGH SENSITIVITY)
Troponin I (High Sensitivity): 64 ng/L — ABNORMAL HIGH (ref <18)
Troponin I (High Sensitivity): 81 ng/L — ABNORMAL HIGH (ref <18)

## 2020-04-01 LAB — COMPREHENSIVE METABOLIC PANEL
ALT: 12 U/L (ref 0–44)
AST: 15 U/L (ref 15–41)
Albumin: 3 g/dL — ABNORMAL LOW (ref 3.5–5.0)
Alkaline Phosphatase: 65 U/L (ref 38–126)
Anion gap: 19 — ABNORMAL HIGH (ref 5–15)
BUN: 77 mg/dL — ABNORMAL HIGH (ref 6–20)
CO2: 23 mmol/L (ref 22–32)
Calcium: 7.8 mg/dL — ABNORMAL LOW (ref 8.9–10.3)
Chloride: 93 mmol/L — ABNORMAL LOW (ref 98–111)
Creatinine, Ser: 14.01 mg/dL — ABNORMAL HIGH (ref 0.44–1.00)
GFR, Estimated: 3 mL/min — ABNORMAL LOW (ref >60)
Glucose, Bld: 123 mg/dL — ABNORMAL HIGH (ref 70–99)
Potassium: 6.4 mmol/L (ref 3.5–5.1)
Sodium: 135 mmol/L (ref 135–145)
Total Bilirubin: 0.6 mg/dL (ref 0.3–1.2)
Total Protein: 8 g/dL (ref 6.5–8.1)

## 2020-04-01 LAB — CBC WITH DIFFERENTIAL/PLATELET
Abs Immature Granulocytes: 0.07 10*3/uL (ref 0.00–0.07)
Basophils Absolute: 0 10*3/uL (ref 0.0–0.1)
Basophils Relative: 0 %
Eosinophils Absolute: 0 10*3/uL (ref 0.0–0.5)
Eosinophils Relative: 0 %
HCT: 31.2 % — ABNORMAL LOW (ref 36.0–46.0)
Hemoglobin: 10.4 g/dL — ABNORMAL LOW (ref 12.0–15.0)
Immature Granulocytes: 1 %
Lymphocytes Relative: 9 %
Lymphs Abs: 0.7 10*3/uL (ref 0.7–4.0)
MCH: 31 pg (ref 26.0–34.0)
MCHC: 33.3 g/dL (ref 30.0–36.0)
MCV: 93.1 fL (ref 80.0–100.0)
Monocytes Absolute: 0.2 10*3/uL (ref 0.1–1.0)
Monocytes Relative: 2 %
Neutro Abs: 6.7 10*3/uL (ref 1.7–7.7)
Neutrophils Relative %: 88 %
Platelets: 210 10*3/uL (ref 150–400)
RBC: 3.35 MIL/uL — ABNORMAL LOW (ref 3.87–5.11)
RDW: 14.6 % (ref 11.5–15.5)
WBC: 7.6 10*3/uL (ref 4.0–10.5)
nRBC: 0 % (ref 0.0–0.2)

## 2020-04-01 LAB — POTASSIUM
Potassium: 4.9 mmol/L (ref 3.5–5.1)
Potassium: 4.9 mmol/L (ref 3.5–5.1)
Potassium: 5.4 mmol/L — ABNORMAL HIGH (ref 3.5–5.1)
Potassium: 6.7 mmol/L (ref 3.5–5.1)
Potassium: 5.9 mmol/L — ABNORMAL HIGH (ref 3.5–5.1)

## 2020-04-01 LAB — GLUCOSE, CAPILLARY
Glucose-Capillary: 145 mg/dL — ABNORMAL HIGH (ref 70–99)
Glucose-Capillary: 167 mg/dL — ABNORMAL HIGH (ref 70–99)
Glucose-Capillary: 184 mg/dL — ABNORMAL HIGH (ref 70–99)
Glucose-Capillary: 115 mg/dL — ABNORMAL HIGH (ref 70–99)

## 2020-04-01 LAB — PHOSPHORUS: Phosphorus: 9.8 mg/dL — ABNORMAL HIGH (ref 2.5–4.6)

## 2020-04-01 LAB — PLATELET FUNCTION ASSAY
Collagen / ADP: 176 seconds — ABNORMAL HIGH (ref 0–118)
Collagen / Epinephrine: 219 seconds — ABNORMAL HIGH (ref 0–193)

## 2020-04-01 LAB — LIPID PANEL
Cholesterol: 144 mg/dL (ref 0–200)
HDL: 33 mg/dL — ABNORMAL LOW (ref >40)
LDL Cholesterol: 97 mg/dL (ref 0–99)
Total CHOL/HDL Ratio: 4.4 RATIO
Triglycerides: 72 mg/dL (ref <150)
VLDL: 14 mg/dL (ref 0–40)

## 2020-04-01 LAB — C-REACTIVE PROTEIN: CRP: 2.6 mg/dL — ABNORMAL HIGH (ref <1.0)

## 2020-04-01 LAB — D-DIMER, QUANTITATIVE: D-Dimer, Quant: 1.33 ug/mL-FEU — ABNORMAL HIGH (ref 0.00–0.50)

## 2020-04-01 LAB — FERRITIN: Ferritin: 1420 ng/mL — ABNORMAL HIGH (ref 11–307)

## 2020-04-01 SURGERY — MRI WITH ANESTHESIA
Anesthesia: General

## 2020-04-01 MED ORDER — PHENYLEPHRINE HCL-NACL 10-0.9 MG/250ML-% IV SOLN
INTRAVENOUS | Status: AC
  Filled 2020-04-01: qty 250

## 2020-04-01 MED ORDER — SODIUM ZIRCONIUM CYCLOSILICATE 10 G PO PACK
10.0000 g | PACK | ORAL | Status: AC
  Administered 2020-04-01: 10 g via ORAL
  Filled 2020-04-01: qty 1

## 2020-04-01 MED ORDER — NALOXONE HCL 0.4 MG/ML IJ SOLN
INTRAMUSCULAR | Status: AC
  Filled 2020-04-01: qty 2

## 2020-04-01 MED ORDER — SUCCINYLCHOLINE CHLORIDE 200 MG/10ML IV SOSY
PREFILLED_SYRINGE | INTRAVENOUS | Status: DC | PRN
  Administered 2020-04-01: 140 mg via INTRAVENOUS

## 2020-04-01 MED ORDER — ALBUTEROL SULFATE (2.5 MG/3ML) 0.083% IN NEBU
10.0000 mg | INHALATION_SOLUTION | Freq: Once | RESPIRATORY_TRACT | Status: AC
  Administered 2020-04-01: 10 mg via RESPIRATORY_TRACT
  Filled 2020-04-01: qty 12

## 2020-04-01 MED ORDER — DIPHENHYDRAMINE HCL 50 MG/ML IJ SOLN: 50.0000 mg | Freq: Once | INTRAMUSCULAR | Status: DC | PRN

## 2020-04-01 MED ORDER — EPINEPHRINE 0.3 MG/0.3ML IJ SOAJ
0.3000 mg | Freq: Once | INTRAMUSCULAR | Status: DC | PRN
  Filled 2020-04-01: qty 0.6

## 2020-04-01 MED ORDER — METHYLPREDNISOLONE SODIUM SUCC 125 MG IJ SOLR: 125.0000 mg | Freq: Once | INTRAMUSCULAR | Status: DC | PRN

## 2020-04-01 MED ORDER — PHENYLEPHRINE 40 MCG/ML (10ML) SYRINGE FOR IV PUSH (FOR BLOOD PRESSURE SUPPORT): PREFILLED_SYRINGE | INTRAVENOUS | Status: DC | PRN

## 2020-04-01 MED ORDER — PROPOFOL 10 MG/ML IV BOLUS
INTRAVENOUS | Status: DC | PRN
  Administered 2020-04-01: 200 mg via INTRAVENOUS

## 2020-04-01 MED ORDER — NALOXONE HCL 0.4 MG/ML IJ SOLN
INTRAMUSCULAR | Status: AC
  Administered 2020-04-01: 0.8 mg
  Filled 2020-04-01: qty 1

## 2020-04-01 MED ORDER — LABETALOL HCL 5 MG/ML IV SOLN
INTRAVENOUS | Status: DC | PRN
  Administered 2020-04-01 (×2): 5 mg via INTRAVENOUS

## 2020-04-01 MED ORDER — SODIUM CHLORIDE 0.9 % IV SOLN: INTRAVENOUS | Status: DC | PRN

## 2020-04-01 MED ORDER — MORPHINE SULFATE (PF) 2 MG/ML IV SOLN
2.0000 mg | Freq: Once | INTRAVENOUS | Status: AC
  Administered 2020-04-01: 2 mg via INTRAVENOUS
  Filled 2020-04-01: qty 1

## 2020-04-01 MED ORDER — ALBUTEROL SULFATE HFA 108 (90 BASE) MCG/ACT IN AERS
2.0000 | INHALATION_SPRAY | Freq: Once | RESPIRATORY_TRACT | Status: DC | PRN
  Filled 2020-04-01: qty 6.7

## 2020-04-01 MED ORDER — FENTANYL CITRATE (PF) 100 MCG/2ML IJ SOLN
INTRAMUSCULAR | Status: AC
  Filled 2020-04-01: qty 2

## 2020-04-01 MED ORDER — FENTANYL CITRATE (PF) 100 MCG/2ML IJ SOLN
INTRAMUSCULAR | Status: DC | PRN
  Administered 2020-04-01: 100 ug via INTRAVENOUS

## 2020-04-01 MED ORDER — LIDOCAINE 2% (20 MG/ML) 5 ML SYRINGE
INTRAMUSCULAR | Status: DC | PRN
  Administered 2020-04-01: 60 mg via INTRAVENOUS

## 2020-04-01 MED ORDER — FAMOTIDINE IN NACL 20-0.9 MG/50ML-% IV SOLN
20.0000 mg | Freq: Once | INTRAVENOUS | Status: DC | PRN
  Filled 2020-04-01: qty 50

## 2020-04-01 MED ORDER — NALOXONE HCL 0.4 MG/ML IJ SOLN
INTRAMUSCULAR | Status: AC
  Filled 2020-04-01: qty 1

## 2020-04-01 MED ORDER — PHENYLEPHRINE HCL-NACL 10-0.9 MG/250ML-% IV SOLN
INTRAVENOUS | Status: DC | PRN
  Administered 2020-04-01: 30 ug/min via INTRAVENOUS

## 2020-04-01 MED ORDER — INSULIN ASPART 100 UNIT/ML IV SOLN
5.0000 [IU] | Freq: Once | INTRAVENOUS | Status: AC
  Administered 2020-04-01: 5 [IU] via INTRAVENOUS

## 2020-04-01 MED ORDER — ESMOLOL HCL 100 MG/10ML IV SOLN
INTRAVENOUS | Status: DC | PRN
  Administered 2020-04-01: 30 mg via INTRAVENOUS

## 2020-04-01 MED ORDER — SODIUM ZIRCONIUM CYCLOSILICATE 10 G PO PACK
10.0000 g | PACK | Freq: Once | ORAL | Status: AC
  Administered 2020-04-01: 10 g via ORAL
  Filled 2020-04-01: qty 1

## 2020-04-01 MED ORDER — DEXAMETHASONE SODIUM PHOSPHATE 10 MG/ML IJ SOLN
INTRAMUSCULAR | Status: DC | PRN
  Administered 2020-04-01: 5 mg via INTRAVENOUS

## 2020-04-01 MED ORDER — DEXTROSE 50 % IV SOLN
1.0000 | Freq: Once | INTRAVENOUS | Status: AC
  Administered 2020-04-01: 50 mL via INTRAVENOUS
  Filled 2020-04-01: qty 50

## 2020-04-01 MED ORDER — SODIUM CHLORIDE 0.9 % IV SOLN
Freq: Once | INTRAVENOUS | Status: AC
  Filled 2020-04-01: qty 20

## 2020-04-01 MED ORDER — HYDRALAZINE HCL 20 MG/ML IJ SOLN
INTRAMUSCULAR | Status: DC | PRN
  Administered 2020-04-01: 10 mg via INTRAVENOUS

## 2020-04-01 MED ORDER — NALOXONE HCL 4 MG/10ML IJ SOLN
1.0000 mg/h | INTRAVENOUS | Status: DC
  Filled 2020-04-01: qty 10

## 2020-04-01 NOTE — Anesthesia Postprocedure Evaluation (Signed)
Anesthesia Post Note  Patient: Erin Dean  Procedure(s) Performed: MRI W/O CONSTRAST  WITH ANESTHESIA (N/A )     Patient location during evaluation: SICU Anesthesia Type: General Level of consciousness: lethargic Pain management: pain level controlled Vital Signs Assessment: post-procedure vital signs reviewed and stable Respiratory status: nonlabored ventilation Cardiovascular status: stable Postop Assessment: no apparent nausea or vomiting Anesthetic complications: yes Comments: Patient with hypertensive episode post emergence and altered mentation in MRI bay with results consistent with acute CVA. Patient initially combative then lethargic but moving all extremities and responsive to commands. Patient transported to unit on stable monitors.      No complications documented.  Last Vitals:  Vitals:   04/01/20 1413 04/01/20 1432  BP: (!) 176/78 (!) 177/76  Pulse: 67 64  Resp: 18 14  Temp: 36.7 C 36.5 C  SpO2: 92% 96%    Last Pain:  Vitals:   04/01/20 1432  TempSrc: Oral  PainSc:                  March Rummage Khalaya Mcgurn

## 2020-04-01 NOTE — Significant Event (Signed)
Rapid Response Event Note  Reason for Call :  Decreased LOC, diaphoretic, pale. Pt just returned from MRI where she was intubated and sedated to tolerate the imaging.   Initial Focused Assessment:  Pt sitting up in bed, leaning on her right arm. She is very drowsy and unable to sustain eye contact. PERRLA, 74mm, sluggish. She is unable to speak at this time, but appears to be understanding commands and attempting to vocalize. She will attempt to follow commands, but in the setting of drowsiness she does not consistently follow through. Narcan administered.   VS: BP 123/81, HR 77, RR 20, SpO2 96% on 4LNC  As she is becoming more alert, she is noted to be disoriented and to have a right facial weakness, right sided visual neglect, right arm and right leg weakness, and severe aphasia. NIHSS 13, see documentation for details and code stroke times. The following imaging was completed: CT. Pt is not a candidate for tPA per Dr. Charlsie Merles.   Interventions:  -Narcan 0.4mg  IV x5 per MD -EKG -CBG 167  Plan of Care:  -q30 min VS and NIHSS x2 hours, then per order -Avoid narcotics  Call rapid response for additional needs  Event Summary:  MD Notified: Dr. Waldron Labs Call Time: 0940 Arrival Time: 0981 End Time: [place], RN

## 2020-04-01 NOTE — Progress Notes (Signed)
Patient arrived back on floor escorted by [facility].  Patient found to be nonresponsive with head and part of upper body hanging off of stretcher.  Patient still had intubation block in place, was very diaphoretic and was not able to follow commands.  Called Charge RN Primitivo Gauze, Dr. Waldron Labs and Rapid Response.  Patient was then taken to CT with Charge RN Primitivo Gauze.

## 2020-04-01 NOTE — Progress Notes (Signed)
Patient resting in bed near end of shift, no newer complaints of chest pain since morphine given. Triponin to be drawn at 7am. Patient continues with [place] at 2L for comfort. Patient continues with minimal shaking. No further vomiting noted. Vitals stable at this time. Patient is able to ambulate to the bathroom. No BM reported during shift. Patient has been NPO since midnight outside of medication administration. Plan is for MRI in the morning. No changes in neuro status. Left sided weakness with previous hx. Safety measures remain in place and will SBARR to day shift nurse at change of shift.

## 2020-04-01 NOTE — Transfer of Care (Signed)
Immediate Anesthesia Transfer of Care Note  Patient: Erin Dean  Procedure(s) Performed: MRI W/O CONSTRAST  WITH ANESTHESIA (N/A )  Patient Location: PACU  Anesthesia Type:General  Level of Consciousness: awake  Airway & Oxygen Therapy: Patient Spontanous Breathing and Patient connected to face mask oxygen  Post-op Assessment: Report given to RN and Post -op Vital signs reviewed and stable  Post vital signs: Reviewed and stable  Last Vitals:  Vitals Value Taken Time  BP 108/51 04/01/20 1010  Temp    Pulse 74 04/01/20 1011  Resp 24 04/01/20 1011  SpO2 98 % 04/01/20 1011  Vitals shown include unvalidated device data.  Last Pain:  Vitals:   04/01/20 0739  TempSrc: Oral  PainSc:          Complications: No complications documented.

## 2020-04-01 NOTE — Progress Notes (Signed)
OT Cancellation Note  Patient Details Name: Erin Dean MRN: [id number] DOB: April 12, 1965   Cancelled Treatment:    Reason Eval/Treat Not Completed: Patient at procedure or test/ unavailable;Patient not medically ready. Pt sedated for MRI this am. Pt with medical decline after MRI. Will attempt later when able.   Ramond Dial, OT/L   Acute OT Clinical Specialist Acute Rehabilitation Services Pager [phone] Office [phone]  04/01/2020, 10:53 AM

## 2020-04-01 NOTE — Progress Notes (Signed)
Patient pressed call bell complaining of chest pain. HR in 150's patient shaking and stating she was going to throw up. [place] given to patient and patient started vomiting. EKG performed at bedside by nurse and nurse tech. EKG abnormal. Provider made aware and morphine given post EKG. Patient continues to shake at this time, current HR 97. O2 placed on patient at 2L. troponin order placed to be drawn. Will continue to monitor at this time.

## 2020-04-01 NOTE — Progress Notes (Addendum)
PROGRESS NOTE                                                                             PROGRESS NOTE                                                                                                                                                                                                             Patient Demographics:    Erin Dean, is a 54 y.o. female, DOB - 1966-02-09, LKG:[id number]  Outpatient Primary MD for the patient is Lois Huxley, [place]    LOS - 1  Admit date - 03/31/2020    Chief Complaint  Patient presents with  . Cerebrovascular Accident       Brief Narrative    Erin Dean is a 54 y.o. 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 She reports h/o LLE weakness with gait disturbance from prior stroke.  She awoke this AM with expressive aphasia and also numbness of L hand and foot.  As well her work-up was significant for COVID-19 positive, she had no symptoms, she is vaccinated but not boosted, he had MRI under anesthesia this morning, which was significant for acute CVA, upon returning to the floor she was significantly altered, where she had a rapid response, she had some improvement with Narcan, noted to have right-sided weakness, which rapid response was called, this has significantly improved currently.    Subjective:    Erin Dean today had rapid response this morning given significant encephalopathy, as well had a code stroke, where her weakness has resolved.     Assessment  & Plan :    Principal Problem:   Acute CVA (cerebrovascular accident) [facility]) Active Problems:   Anxiety   Essential hypertension   Obesity   Hyperlipidemia   ESRD on dialysis ([place])   COVID-19 virus infection   Chronic diastolic (congestive) heart failure ([place])  Acute CVA -Patient with left-sided weakness and numbness on presentation, acute right-sided hemiparesis this  morning after MRI with sedation. -Management per neurology.  Weakness secondary to sedation, versus acute left frontal lobe infarct by MRI, weakness improved rapidly with increased alertness. -She is currently on aspirin, Plavix and atorvastatin -PT/OT/SLP consulted  Acute encephalopathy/unspecified -Ischemic versus metabolic -Patient had an episode of altered mental status, minimally responsive upon coming back from MRI with anesthesia where she was intubated, she did receive propofol, fentanyl she did receive Narcan x5, with improvement of mental status, for which she was noted after to have some right side weakness, this has totally resolved. -Resolved, mentation back at baseline  COVID 19 infection -Unfortunately, she also appears to have breakthrough COVID infection despite vaccine plus booster -If symptoms are present, they are mild with periodic tachypnea, O2 sat to 93% -COVID POSITIVE, and confirmation test is also positive -The patient has comorbidities which may increase the risk for ARDS/MODS including:  HTN, DM, obesity,  -Patient is with no hypoxia, no evidence of pneumonia on chest x-ray, no indication for steroids or remdesivir, she does qualify for monoclonal antibody, I have discussed with her, explained risks and benefits, she is agreeable, will proceed with monoclonal antibody.  HTN -Allow permissive HTNfor now -Treat BP only if >220/120, and then with goal of 15% reduction -HoldNorvasc, Coreg, Catapres, Hydralazine, Lasixand plan to restart in 48-72 hours  HLD -Check FLP; lipids in 03/2018: 117/27/77/67 -Continue Lipitor but increase to 80 mg daily for LDL goal <70  ESRD -Patient on chronic TTS HD -Nephrology consulted -Continue Calictriol, Sensipar, Renavit, Renagel  Chronic diastolic CHF -Echo in 45/8099 with preserved EF and grade 2 diastolic dysfunction -Hold Lasix for now -Volume control with HD  Anxiety and Depression -Continue Xanax,  Lexapro  Obesity -BMI 38.8 (prior weight; repeat pending) -Weight loss should be encouraged -Outpatient PCP/bariatric medicine f/u encouraged     SpO2: 98 % O2 Flow Rate (L/min): 4 L/min  Recent Labs  Lab 03/31/20 0616 03/31/20 0751 03/31/20 0934 03/31/20 1309 04/01/20 0115  WBC 8.6  --   --   --  7.6  PLT 198  --   --   --  210  CRP  --   --   --  2.2* 2.6*  DDIMER  --   --   --  1.16* 1.33*  PROCALCITON  --   --   --  0.27  --   AST 16  --   --   --  15  ALT 12  --   --   --  12  ALKPHOS 66  --   --   --  65  BILITOT 0.6  --   --   --  0.6  ALBUMIN 3.1*  --   --   --  3.0*  INR 1.1  --   --   --   --   SARSCOV2NAA  --  POSITIVE* POSITIVE*  --   --        ABG     Component Value Date/Time   TCO2 27 03/31/2020 0622           Condition - Extremely Guarded  Family Communication  : Unable to leave daughter voicemail on current available phone numbers on records.  Code Status :  Full  Consults  :  Neurology  Procedures  :  none  Disposition Plan  :    Status is: Inpatient  Remains inpatient appropriate because:Ongoing diagnostic testing needed not appropriate for outpatient work up and IV treatments appropriate due to intensity of illness  or inability to take PO   Dispo: The patient is from: Home              Anticipated d/c is to: Home              Anticipated d/c date is: 3 days              Patient currently is not medically stable to d/c.      DVT Prophylaxis  :  [place] heparin  Lab Results  Component Value Date   PLT 210 04/01/2020    Diet :  Diet Order            DIET DYS 2 Room service appropriate? Yes; Fluid consistency: Nectar Thick  Diet effective now                  Inpatient Medications  Scheduled Meds: .  stroke: mapping our early stages of recovery book   Does not apply Once  . albuterol  2 puff Inhalation Q6H  . vitamin C  500 mg Oral Daily  . aspirin EC  81 mg Oral Daily  . atorvastatin  80 mg Oral q1800   . calcitRIOL  3.75 mcg Oral Q M,W,F-HD  . Chlorhexidine Gluconate Cloth  6 each Topical Q0600  . cinacalcet  180 mg Oral Q M,W,F-HD  . clopidogrel  75 mg Oral Daily  . escitalopram  10 mg Oral Daily  . heparin  5,000 Units Subcutaneous Q8H  . insulin aspart  0-6 Units Subcutaneous TID WC  . pantoprazole  40 mg Oral Daily  . sevelamer carbonate  3,200 mg Oral TID WC  . zinc sulfate  220 mg Oral Daily   Continuous Infusions: . sodium chloride    . sodium chloride    . naLOXone (NARCAN) adult infusion for OVERDOSE     PRN Meds:.sodium chloride, sodium chloride, acetaminophen **OR** acetaminophen, ALPRAZolam, calcium carbonate (dosed in mg elemental calcium), camphor-menthol **AND** hydrOXYzine, chlorpheniramine-HYDROcodone, docusate sodium, feeding supplement (NEPRO CARB STEADY), guaiFENesin-dextromethorphan, heparin, lidocaine (PF), lidocaine-prilocaine, ondansetron **OR** ondansetron (ZOFRAN) IV, pentafluoroprop-tetrafluoroeth, senna-docusate, sorbitol, zolpidem  Antibiotics  :    Anti-infectives (From admission, onward)   Start     Dose/Rate Route Frequency Ordered Stop   04/01/20 1000  remdesivir 100 mg in sodium chloride 0.9 % 100 mL IVPB  Status:  Discontinued       "Followed by" Linked Group Details   100 mg 200 mL/hr over 30 Minutes Intravenous Daily 03/31/20 1515 04/01/20 0812   03/31/20 1600  remdesivir 200 mg in sodium chloride 0.9% 250 mL IVPB       "Followed by" Linked Group Details   200 mg 580 mL/hr over 30 Minutes Intravenous Once 03/31/20 1515 03/31/20 1733        Camrie Stock M.D on 04/01/2020 at 1:26 PM    [facility] -  Office  [phone]      Objective:   Vitals:   04/01/20 1025 04/01/20 1030 04/01/20 1035 04/01/20 1208  BP: (!) 160/81 (!) 163/76 (!) 161/78 (!) 178/96  Pulse: 74 89 78 71  Resp: (!) 25 16 (!) 24 15  Temp:    98.1 F (36.7 C)  TempSrc:    Oral  SpO2: 94% 91% 100% 98%  Weight:        Wt Readings from Last 3  Encounters:  04/01/20 112.6 kg  12/01/18 115.7 kg  11/03/18 117.9 kg     Intake/Output Summary (Last 24 hours) at 04/01/2020 1326 Last data  filed at 04/01/2020 1013 Gross per 24 hour  Intake 300 ml  Output 400 ml  Net -100 ml     Physical Exam   Is currently more awake, alert and appropriate, earlier during the day she was drowsy, minimally responsive and altered after she came back from MRI under anesthesia . Symmetrical Chest wall movement, Good air movement bilaterally, CTAB RRR,No Gallops,Rubs or new Murmurs, No Parasternal Heave +ve B.Sounds, Abd Soft, No tenderness No Cyanosis, Clubbing or edema, No new Rash or bruise      Data Review:    CBC Recent Labs  Lab 03/31/20 0616 03/31/20 0622 04/01/20 0115  WBC 8.6  --  7.6  HGB 11.0* 11.6* 10.4*  HCT 34.2* 34.0* 31.2*  PLT 198  --  210  MCV 96.9  --  93.1  MCH 31.2  --  31.0  MCHC 32.2  --  33.3  RDW 14.8  --  14.6  LYMPHSABS 1.8  --  0.7  MONOABS 1.5*  --  0.2  EOSABS 0.3  --  0.0  BASOSABS 0.1  --  0.0    Recent Labs  Lab 03/31/20 0616 03/31/20 0622 03/31/20 1309 04/01/20 0115 04/01/20 0418 04/01/20 0718 04/01/20 1120  NA 137 138  --  135  --   --   --   K 5.6* 5.5*  --  6.4* 4.9 4.9 5.4*  CL 94* 96*  --  93*  --   --   --   CO2 25  --   --  23  --   --   --   GLUCOSE 94 88  --  123*  --   --   --   BUN 68* 69*  --  77*  --   --   --   CREATININE 12.72* 13.00*  --  14.01*  --   --   --   CALCIUM 7.5*  --   --  7.8*  --   --   --   AST 16  --   --  15  --   --   --   ALT 12  --   --  12  --   --   --   ALKPHOS 66  --   --  65  --   --   --   BILITOT 0.6  --   --  0.6  --   --   --   ALBUMIN 3.1*  --   --  3.0*  --   --   --   CRP  --   --  2.2* 2.6*  --   --   --   DDIMER  --   --  1.16* 1.33*  --   --   --   PROCALCITON  --   --  0.27  --   --   --   --   INR 1.1  --   --   --   --   --   --   TSH  --   --  1.974  --   --   --   --   HGBA1C  --   --  5.3  --   --   --   --      ------------------------------------------------------------------------------------------------------------------ Recent Labs    04/01/20 0115  CHOL 144  HDL 33*  LDLCALC 97  TRIG 72  CHOLHDL 4.4    Lab Results  Component Value Date   HGBA1C  5.3 03/31/2020   ------------------------------------------------------------------------------------------------------------------ Recent Labs    03/31/20 1309  TSH 1.974    Cardiac Enzymes No results for input(s): CKMB, TROPONINI, MYOGLOBIN in the last 168 hours.  Invalid input(s): CK ------------------------------------------------------------------------------------------------------------------    Component Value Date/Time   BNP 1,262.2 (H) 04/18/2017 0132    Micro Results Recent Results (from the past 240 hour(s))  Resp Panel by RT-PCR (Flu A&B, Covid) Nasopharyngeal Swab     Status: Abnormal   Collection Time: 03/31/20  7:51 AM   Specimen: Nasopharyngeal Swab; Nasopharyngeal(NP) swabs in vial transport medium  Result Value Ref Range Status   SARS Coronavirus 2 by RT PCR POSITIVE (A) NEGATIVE Final    Comment: emailed L. Berdik RN 8:55 03/31/20 ([email or web address]) (NOTE) SARS-CoV-2 target nucleic acids are DETECTED.  The SARS-CoV-2 RNA is generally detectable in upper respiratory specimens during the acute phase of infection. Positive results are indicative of the presence of the identified virus, but do not rule out bacterial infection or co-infection with other pathogens not detected by the test. Clinical correlation with patient history and other diagnostic information is necessary to determine patient infection status. The expected result is Negative.  Fact Sheet for Patients: EntrepreneurPulse.com.au  Fact Sheet for Healthcare Providers: IncredibleEmployment.be  This test is not yet approved or cleared by the [place] FDA and  has been authorized for detection and/or  diagnosis of SARS-CoV-2 by FDA under an Emergency Use Authorization (EUA).  This EUA will remain in effect (meaning this test can be used) for the duration of  the COVID-1 9 declaration under Section 564(b)(1) of the Act, 21 U.S.C. section 360bbb-3(b)(1), unless the authorization is terminated or revoked sooner.     Influenza A by PCR NEGATIVE NEGATIVE Final   Influenza B by PCR NEGATIVE NEGATIVE Final    Comment: (NOTE) The Xpert Xpress SARS-CoV-2/FLU/RSV plus assay is intended as an aid in the diagnosis of influenza from Nasopharyngeal swab specimens and should not be used as a sole basis for treatment. Nasal washings and aspirates are unacceptable for Xpert Xpress SARS-CoV-2/FLU/RSV testing.  Fact Sheet for Patients: EntrepreneurPulse.com.au  Fact Sheet for Healthcare Providers: IncredibleEmployment.be  This test is not yet approved or cleared by the [place] FDA and has been authorized for detection and/or diagnosis of SARS-CoV-2 by FDA under an Emergency Use Authorization (EUA). This EUA will remain in effect (meaning this test can be used) for the duration of the COVID-19 declaration under Section 564(b)(1) of the Act, 21 U.S.C. section 360bbb-3(b)(1), unless the authorization is terminated or revoked.  Performed at [facility], [place] [address], [place], [place] [address]   Resp Panel by RT-PCR (Flu A&B, Covid)     Status: Abnormal   Collection Time: 03/31/20  9:34 AM  Result Value Ref Range Status   SARS Coronavirus 2 by RT PCR POSITIVE (A) NEGATIVE Final    Comment: CRITICAL VALUE NOTED.  VALUE IS CONSISTENT WITH PREVIOUSLY REPORTED AND CALLED VALUE. (NOTE) SARS-CoV-2 target nucleic acids are DETECTED.  The SARS-CoV-2 RNA is generally detectable in upper respiratory specimens during the acute phase of infection. Positive results are indicative of the presence of the identified virus, but do not rule out bacterial  infection or co-infection with other pathogens not detected by the test. Clinical correlation with patient history and other diagnostic information is necessary to determine patient infection status. The expected result is Negative.  Fact Sheet for Patients: EntrepreneurPulse.com.au  Fact Sheet for Healthcare Providers: IncredibleEmployment.be  This test is not yet approved  or cleared by the [place] and  has been authorized for detection and/or diagnosis of SARS-CoV-2 by FDA under an Emergency Use Authorization (EUA).  This EUA will remain in effect (meaning this test can b e used) for the duration of  the COVID-19 declaration under Section 564(b)(1) of the Act, 21 U.S.C. section 360bbb-3(b)(1), unless the authorization is terminated or revoked sooner.     Influenza A by PCR NEGATIVE NEGATIVE Final   Influenza B by PCR NEGATIVE NEGATIVE Final    Comment: (NOTE) The Xpert Xpress SARS-CoV-2/FLU/RSV plus assay is intended as an aid in the diagnosis of influenza from Nasopharyngeal swab specimens and should not be used as a sole basis for treatment. Nasal washings and aspirates are unacceptable for Xpert Xpress SARS-CoV-2/FLU/RSV testing.  Fact Sheet for Patients: EntrepreneurPulse.com.au  Fact Sheet for Healthcare Providers: IncredibleEmployment.be  This test is not yet approved or cleared by the [place] FDA and has been authorized for detection and/or diagnosis of SARS-CoV-2 by FDA under an Emergency Use Authorization (EUA). This EUA will remain in effect (meaning this test can be used) for the duration of the COVID-19 declaration under Section 564(b)(1) of the Act, 21 U.S.C. section 360bbb-3(b)(1), unless the authorization is terminated or revoked.  Performed at [facility], [place] [address] [place], [place] [address]     Radiology Reports CT Angio Head W or [place]  Contrast  Addendum Date: 03/31/2020   ADDENDUM REPORT: 03/31/2020 10:03 ADDENDUM: Findings discussed with Dr. Lorin Mercy at 10 a.m. via telephone. Electronically Signed   By: Margaretha Sheffield MD   On: 03/31/2020 10:03   Result Date: 03/31/2020 CLINICAL DATA:  Neuro deficit, acute stroke suspected. Left-sided weakness, slurred speech. EXAM: CT ANGIOGRAPHY HEAD AND NECK TECHNIQUE: Multidetector CT imaging of the head and neck was performed using the standard protocol during bolus administration of intravenous contrast. Multiplanar CT image reconstructions and MIPs were obtained to evaluate the vascular anatomy. Carotid stenosis measurements (when applicable) are obtained utilizing NASCET criteria, using the distal internal carotid diameter as the denominator. CONTRAST:  49mL OMNIPAQUE IOHEXOL 350 MG/ML SOLN COMPARISON:  Same day CT head.  MR/MRA from 04/18/2017. FINDINGS: CTA NECK FINDINGS Aortic arch: Great vessel origins are patent. Right carotid system: No evidence of dissection, stenosis (50% or greater) or occlusion. Left carotid system: Mixed calcific and noncalcific atherosclerosis at the carotid bifurcation without greater than 50% narrowing. 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 Suspected severe stenosis at the C2 level Skeleton: No acute findings. Other neck: No mass or suspicious adenopathy. Upper chest: No consolidation. Mild ground-glass opacities which may relate to poor inspiration. Review of the MIP images confirms the above findings CTA HEAD FINDINGS Evaluation is limited by venous contrast contamination and motion. Within this limitation: Anterior circulation: Bilateral internal carotid arteries are patent. Suspected moderate stenosis of the distal right M1 MCA. Proximal right M2 MCAs appear patent with limited evaluation for stenosis. There is focal calcification of a proximal left M2 MCA branch,  which correlates with the calcification seen on same day noncontrast head CT. The bilateral A1 and proximal A2 ACAs are patent. Evaluation for stenosis is limited given venous contamination. Posterior circulation: Poor opacification of the intradural right vertebral artery with severe stenosis of the distal V4 vertebral artery. The left intradural vertebral artery is patent. The basilar artery is patent. Bilateral PCAs are patent proximally without evidence of proximal hemodynamically significant stenosis. More distal  PCAs are not well visualized secondary to venous contamination, but appear patent. Venous sinuses: As permitted by contrast timing, patent. Review of the MIP images confirms the above findings IMPRESSION: 1. Focal calcification within a proximal left M2 MCA branch, which correlates with the calcification seen on same day noncontrast head CT. This results in severe stenosis with opacification of the more distal branches. Differential considerations include calcified embolus (paritcularly given calcification was not evident on prior CT from 2019 and given the patient's reported speech difficulty and reported recent dialysis fistula intervention) versus progressive calcific atherosclerosis. 2. Intracranial evaluation is limited by venous contamination and motion with a suspected moderate stenosis of the distal right M1 MCA. 3. 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 Findings may be chronic given poor flow related signal of the right intradural vertebral artery on prior MRA head from 04/18/2017. 4. 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 5. Bilateral carotid bifurcation atherosclerosis without evidence of greater than 50% narrowing. Electronically Signed: By: Margaretha Sheffield MD On: 03/31/2020 09:50   CT HEAD WO CONTRAST  Result  Date: 03/31/2020 CLINICAL DATA:  Neuro deficit, acute, stroke suspected; headache, classic migraine. Additional history provided: 2 prior strokes, left-sided weakness, slurred speech, right frontal headache. EXAM: CT HEAD WITHOUT CONTRAST TECHNIQUE: Contiguous axial images were obtained from the base of the skull through the vertex without intravenous contrast. COMPARISON:  Prior brain MRI 03/11/2018. FINDINGS: Brain: Redemonstrated chronic infarcts within the right frontal lobe (for instance as seen on series 3, image 20) and left parietal lobe. Background cerebral volume is normal. Mild ill-defined hypoattenuation within the cerebral white matter is nonspecific, but compatible with chronic small vessel ischemic disease. Known small chronic infarcts within the bilateral cerebellar hemispheres were better appreciated on the prior MRI of 03/11/2018. There is no acute intracranial hemorrhage. No acute demarcated cortical infarct is identified. No extra-axial fluid collection. No evidence of intracranial mass. No midline shift. Vascular: Atherosclerotic calcifications. There is a focus of calcification in the region of the proximal M2 left middle cerebral artery (in the left sylvian fissure) (series 3, image 11). Skull: Normal. Negative for fracture or focal lesion. Sinuses/Orbits: Visualized orbits show no acute finding. Mild right ethmoid sinus mucosal thickening. ASPECTS [facility] Stroke Program Early CT Score) - Ganglionic level infarction (caudate, lentiform nuclei, internal capsule, insula, M1-M3 cortex): 7 - Supraganglionic infarction (M4-M6 cortex): 3 Total score (0-10 with 10 being normal): 10 (when discounting chronic infarcts). These results were called by telephone at the time of interpretation on 03/31/2020 at 7:45 am to provider [facility] , who verbally acknowledged these results. IMPRESSION: No evidence of acute infarct or acute intracranial hemorrhage. ASPECTS is 10 (when discounting chronic  infarcts). Focus of calcification in the left sylvian fissure in the location of the proximal M2 left middle cerebral artery. This is a new finding as compared to the head CT of 03/10/2018. Differential considerations include calcified embolus versus interval development of calcified plaque at this site. Consider CTA for further evaluation (particularly given the provided history of speech difficulty). Redemonstrated chronic cortically based infarcts within the right frontal and left parietal lobes. Mild cerebral white matter chronic small vessel ischemic disease. Electronically Signed   By: Kellie Simmering DO   On: 03/31/2020 07:58   CT Angio Neck W and/or [place] Contrast  Addendum Date: 03/31/2020   ADDENDUM REPORT: 03/31/2020 10:03 ADDENDUM: Findings discussed  with Dr. Lorin Mercy at 10 a.m. via telephone. Electronically Signed   By: Margaretha Sheffield MD   On: 03/31/2020 10:03   Result Date: 03/31/2020 CLINICAL DATA:  Neuro deficit, acute stroke suspected. Left-sided weakness, slurred speech. EXAM: CT ANGIOGRAPHY HEAD AND NECK TECHNIQUE: Multidetector CT imaging of the head and neck was performed using the standard protocol during bolus administration of intravenous contrast. Multiplanar CT image reconstructions and MIPs were obtained to evaluate the vascular anatomy. Carotid stenosis measurements (when applicable) are obtained utilizing NASCET criteria, using the distal internal carotid diameter as the denominator. CONTRAST:  41mL OMNIPAQUE IOHEXOL 350 MG/ML SOLN COMPARISON:  Same day CT head.  MR/MRA from 04/18/2017. FINDINGS: CTA NECK FINDINGS Aortic arch: Great vessel origins are patent. Right carotid system: No evidence of dissection, stenosis (50% or greater) or occlusion. Left carotid system: Mixed calcific and noncalcific atherosclerosis at the carotid bifurcation without greater than 50% narrowing. 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 Suspected severe stenosis at the C2 level Skeleton: No acute findings. Other neck: No mass or suspicious adenopathy. Upper chest: No consolidation. Mild ground-glass opacities which may relate to poor inspiration. Review of the MIP images confirms the above findings CTA HEAD FINDINGS Evaluation is limited by venous contrast contamination and motion. Within this limitation: Anterior circulation: Bilateral internal carotid arteries are patent. Suspected moderate stenosis of the distal right M1 MCA. Proximal right M2 MCAs appear patent with limited evaluation for stenosis. There is focal calcification of a proximal left M2 MCA branch, which correlates with the calcification seen on same day noncontrast head CT. The bilateral A1 and proximal A2 ACAs are patent. Evaluation for stenosis is limited given venous contamination. Posterior circulation: Poor opacification of the intradural right vertebral artery with severe stenosis of the distal V4 vertebral artery. The left intradural vertebral artery is patent. The basilar artery is patent. Bilateral PCAs are patent proximally without evidence of proximal hemodynamically significant stenosis. More distal PCAs are not well visualized secondary to venous contamination, but appear patent. Venous sinuses: As permitted by contrast timing, patent. Review of the MIP images confirms the above findings IMPRESSION: 1. Focal calcification within a proximal left M2 MCA branch, which correlates with the calcification seen on same day noncontrast head CT. This results in severe stenosis with opacification of the more distal branches. Differential considerations include calcified embolus (paritcularly given calcification was not evident on prior CT from 2019 and given the patient's reported speech difficulty and reported recent dialysis fistula intervention) versus progressive calcific atherosclerosis. 2. Intracranial evaluation is limited by venous  contamination and motion with a suspected moderate stenosis of the distal right M1 MCA. 3. 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 Findings may be chronic given poor flow related signal of the right intradural vertebral artery on prior MRA head from 04/18/2017. 4. 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 5. Bilateral carotid bifurcation atherosclerosis without evidence of greater than 50% narrowing. Electronically Signed: By: Margaretha Sheffield MD On: 03/31/2020 09:50   MR BRAIN WO CONTRAST  Result Date: 04/01/2020 CLINICAL DATA:  Acute neuro deficit with stroke suspected EXAM: MRI HEAD WITHOUT CONTRAST TECHNIQUE: Multiplanar, multiecho pulse sequences of the brain and surrounding structures were obtained without intravenous contrast. COMPARISON:  Head CT and CTA from yesterday.  Brain MRI 03/11/2018 FINDINGS: Brain: Small acute  cortical infarct along the lateral left frontal lobe, MCA distribution. Moderate to large remote left parietal infarct which is cortically based. Small remote superior right frontal infarct. No acute hemorrhage, hydrocephalus, or masslike finding. Chronic blood products at sites of prior infarction. Susceptibility along the low left sylvian fissure correlating with calcification on prior CTA. Vascular: Normal flow voids Skull and upper cervical spine: Normal marrow signal Sinuses/Orbits: Negative IMPRESSION: 1. Small acute infarct along the left frontal cortex. 2. 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 Electronically Signed   By: Monte Fantasia M.D.   On: 04/01/2020 09:49   DG Chest Portable 1 View  Result Date: 03/31/2020 CLINICAL DATA:  Stroke-like symptoms EXAM: PORTABLE CHEST 1 VIEW COMPARISON:  03/09/2018 FINDINGS: Cardiomegaly with stable mediastinal contours. Implantable loop recorder.  There is no edema, consolidation, effusion, or pneumothorax. Artifact from EKG leads IMPRESSION: 1. No acute finding or change from 2019. 2. Cardiomegaly. Electronically Signed   By: Monte Fantasia M.D.   On: 03/31/2020 07:02   CT HEAD CODE STROKE WO CONTRAST  Result Date: 04/01/2020 CLINICAL DATA:  Code stroke. Right-sided weakness and inability to speak EXAM: CT HEAD WITHOUT CONTRAST TECHNIQUE: Contiguous axial images were obtained from the base of the skull through the vertex without intravenous contrast. COMPARISON:  Brain MRI from earlier today FINDINGS: Brain: Small acute left frontal cortex infarct by preceding MRI. Remote left parietal and superior right frontal infarcts. No hemorrhage, hydrocephalus, or collection. Vascular: High-density vessels from recent CTA. Presumed calcified embolism is again seen at the low left sylvian fissure. Skull: Negative Sinuses/Orbits: Negative ASPECTS ([place] Stroke Program Early CT Score) Not scored in this setting IMPRESSION: 1. Small acute infarct in the left frontal lobe by brain MRI 2 hours prior. No acute hemorrhage. 2. Remote left parietal and right frontal infarcts. 3. Presumed calcified embolism in the low left sylvian fissure. Electronically Signed   By: Monte Fantasia M.D.   On: 04/01/2020 10:57

## 2020-04-01 NOTE — Anesthesia Procedure Notes (Signed)
Procedure Name: Intubation Performed by: Cleda Daub, CRNA Pre-anesthesia Checklist: Patient identified, Emergency Drugs available, Suction available and Patient being monitored Patient Re-evaluated:Patient Re-evaluated prior to induction Oxygen Delivery Method: Circle system utilized Preoxygenation: Pre-oxygenation with 100% oxygen Induction Type: IV induction Ventilation: Mask ventilation without difficulty Laryngoscope Size: Glidescope and 4 Grade View: Grade I Tube type: Oral Tube size: 7.5 mm Number of attempts: 1 Airway Equipment and Method: Stylet and Oral airway Placement Confirmation: ETT inserted through vocal cords under direct vision,  positive ETCO2 and breath sounds checked- equal and bilateral Secured at: 22 cm Tube secured with: Tape Dental Injury: Teeth and Oropharynx as per pre-operative assessment

## 2020-04-01 NOTE — Evaluation (Signed)
Clinical/Bedside Swallow Evaluation Patient Details  Name: GENAVIVE KUBICKI MRN: [id number] Date of Birth: 1965/06/08  Today's Date: 04/01/2020 Time: SLP Start Time (ACUTE ONLY): 1751 SLP Stop Time (ACUTE ONLY): 1412 SLP Time Calculation (min) (ACUTE ONLY): 8 min  Past Medical History:  Past Medical History:  Diagnosis Date  . ESRD (end stage renal disease) ([place])    [facility] [place]   . Hypertension   . Stroke ([place]) 04/2017   no residual . Limp left side  . SVT (supraventricular tachycardia) ([facility])    Past Surgical History:  Past Surgical History:  Procedure Laterality Date  . AV FISTULA PLACEMENT Right 08/16/2017   Procedure: CREATION OF RADIOCEPHALIC VERSUS BRACHIOCEPHALIC ARTERIOVENOUS FISTULA RIGHT ARM;  Surgeon: Conrad Social Circle, MD;  Location: [place];  Service: Vascular;  Laterality: Right;  . AV FISTULA PLACEMENT Right 02/07/2018   Procedure: Creation Right arm Brachiocephalic Fistula;  Surgeon: Marty Heck, MD;  [facility];  Service: Vascular;  Laterality: Right;  . AV FISTULA PLACEMENT Left 08/15/2018   Procedure: ARTERIOVENOUS (AV) FISTULA CREATION;  Surgeon: Serafina Mitchell, MD;  Location: [place];  Service: Vascular;  Laterality: Left;  . BASCILIC VEIN TRANSPOSITION Right 04/07/2018   Procedure: SECOND STAGE BASILIC VEIN TRANSPOSITION RIGHT ARM;  Surgeon: Marty Heck, MD;  Location: [place];  Service: Vascular;  Laterality: Right;  . CESAREAN SECTION    . ESOPHAGOGASTRODUODENOSCOPY N/A 04/19/2017   Procedure: ESOPHAGOGASTRODUODENOSCOPY (EGD);  Surgeon: Carol Ada, MD;  Location: [place];  Service: Endoscopy;  Laterality: N/A;  . FISTULA SUPERFICIALIZATION Right 10/01/2017   Procedure: FISTULA SUPERFICIALIZATION RIGHT ARM;  Surgeon: Conrad Curtis, MD;  Location: [place];  Service: Vascular;  Laterality: Right;  . FISTULA SUPERFICIALIZATION Left 10/08/2018   Procedure: FISTULA SUPERFICIALIZATION LEFT ARM;  Surgeon: Serafina Mitchell, MD;  Location: [place];  Service:  Vascular;  Laterality: Left;  . IR FLUORO GUIDE CV LINE RIGHT  04/23/2018  . IR US GUIDE VASC ACCESS RIGHT  04/23/2018  . LOOP RECORDER INSERTION N/A 04/22/2017   Procedure: LOOP RECORDER INSERTION;  Surgeon: Constance Haw, MD;  Location: [place] CV LAB;  Service: Cardiovascular;  Laterality: N/A;  . TEE WITHOUT CARDIOVERSION N/A 04/19/2017   Procedure: TRANSESOPHAGEAL ECHOCARDIOGRAM (TEE);  Surgeon: Carol Ada, MD;  Location: [place];  Service: Endoscopy;  Laterality: N/A;   HPI:  ILIYANA CONVEY is a 54 y.o. female with a history significant for ESRD on HD Tu, Th, Sat, strokes x 2, obesity, current COVID+, HTN, and a left upper arm fistula repair for an infiltrated graft on 03/30/2020 who presented to [facility] 12/23 for stroke-like symptoms. She woke up 12/23 with expressive aphasia, stuttering/halting speech, and intermittent numbness of the LEFT face, arm, leg, right facial droop. She also has been unable to complete her HD sessions lately related to intermittent chest pain.   MRI done under sedation; shows small acute infarct along the LEFT frontal cortex with remote left parietal and right frontal cortex infarcts (inconsistent with presenting left-sided symptoms). Stroke alert was activated due to patient right-sided hemipareis following sedation for MRI.   Assessment / Plan / Recommendation Clinical Impression  Pt demonstrates no signs of dysphagia, there is no acute oral weakness noted. Pt able to masticate well and passed 3 oz water swallow with SLP. Will resume regular diet/thin liquids.   Pt also noted to have mild expressive aphasia with paraphasias, hesitations for word finding. Pt is able to express wants and needs and follow complex commands.  Recommend f/u with OP SLP. Will defer full acute work up at this time as all needs can be addressed at next level of care.  SLP Visit Diagnosis: Dysphagia, oropharyngeal phase (R13.12)    Aspiration Risk  Mild aspiration risk     Diet Recommendation Regular;Thin liquid   Liquid Administration via: Cup;Straw Medication Administration: Whole meds with liquid Supervision: Patient able to self feed Postural Changes: Seated upright at 90 degrees    Other  Recommendations Oral Care Recommendations: Oral care BID   Follow up Recommendations Outpatient SLP      Frequency and Duration            Prognosis        Swallow Study   General HPI: NATHALIE CAVENDISH is a 54 y.o. female with a history significant for ESRD on HD Tu, Th, Sat, strokes x 2, obesity, current COVID+, HTN, and a left upper arm fistula repair for an infiltrated graft on 03/30/2020 who presented to [facility] 12/23 for stroke-like symptoms. She woke up 12/23 with expressive aphasia, stuttering/halting speech, and intermittent numbness of the LEFT face, arm, leg, right facial droop. She also has been unable to complete her HD sessions lately related to intermittent chest pain.   MRI done under sedation; shows small acute infarct along the LEFT frontal cortex with remote left parietal and right frontal cortex infarcts (inconsistent with presenting left-sided symptoms). Stroke alert was activated due to patient right-sided hemipareis following sedation for MRI. Type of Study: Bedside Swallow Evaluation Diet Prior to this Study: Dysphagia 2 (chopped);Nectar-thick liquids Temperature Spikes Noted: No Respiratory Status: Room air Behavior/Cognition: Alert;Cooperative;Pleasant mood Oral Cavity Assessment: Within Functional Limits Oral Care Completed by SLP: No Oral Cavity - Dentition: Adequate natural dentition Vision: Functional for self-feeding Self-Feeding Abilities: Able to feed self Patient Positioning: Upright in chair Baseline Vocal Quality: Normal Volitional Cough: Strong Volitional Swallow: Able to elicit    Oral/Motor/Sensory Function     Ice Chips     Thin Liquid Thin Liquid: Within functional limits Presentation: Cup;Self Fed    Nectar Thick  Nectar Thick Liquid: Not tested   Honey Thick Honey Thick Liquid: Not tested   Puree Puree: Not tested   Solid     Solid: Within functional limits     Herbie Baltimore, MA CCC-SLP  Acute Rehabilitation Services Pager [phone] Office [phone]  Lynann Beaver 04/01/2020,2:20 PM

## 2020-04-01 NOTE — Progress Notes (Signed)
Reported Critical Lab Value of K at 6.7 to Dr. Waldron Labs.  He requested I call dialysis.  Per dialysis the patient can not been dialized until 10pm.

## 2020-04-01 NOTE — Progress Notes (Addendum)
Neurology Progress Note  CC: Right sided weakness following MRI with sedation   History is obtained from: chart, patient, [facility] staff   HPI: Erin Dean is a 54 y.o. female with a history significant for ESRD on HD Tu, Th, Sat, strokes x 2, obesity, current COVID+, HTN, and a left upper arm fistula repair for an infiltrated graft on 03/30/2020 who presented to [facility] 12/23 for stroke-like symptoms. She woke up 12/23 with expressive aphasia, stuttering/halting speech, and intermittent numbness of the LEFT face, arm, leg, right facial droop. She also has been unable to complete her HD sessions lately related to intermittent chest pain. She initially was not activated as a code stroke due to wake-up symptoms and a very low NIHSS/low suspicion for LVO. At baseline, she has residual LLE weakness with gait disturbance from previous CVA and has had extensive stroke work-ups in the past.   12/23: Stroke work-up found Erin Dean has elevated homocysteine levels but with normal folate levels (no MTHFR checked) and her echocardiogram revealed a severely dilated left atrium. Loop recorder without evidence of malignant dysrhythmia. An extensive hypercoagulability and autoimmune workup has been done. ESR was elevated, but CRP was OK. No clear evidence of vasculitis has been identified on previous imaging.   12/24:  -09:49 MRI done under sedation; shows small acute infarct along the LEFT frontal cortex with remote left parietal and right frontal cortex infarcts (inconsistent with presenting left-sided symptoms)  -10:25 Stroke alert was activated due to patient right-sided hemipareis following sedation for MRI.   LKW: 12/22 8-9 pm (fluctuating symptoms since hospitalization and reason for hospitalization) tpa given?: No, not eligible with symptoms starting and fluctuating since 12/23 (LKW 12/22 8-9 pm) IR Thrombectomy? No, recent images with no LVO, during CT head non-contrast, all symptoms resolved and unable to  obtain CTA head/neck and CTP due to patient severe clausterphobia.  Modified Rankin Scale: 1-No significant post stroke disability and can perform usual duties with stroke symptoms ICH Score: n/a  NIHSS: initial assessment on arrival to CT at 10:28 1a Level of Conscious.: 1 1b LOC Questions: 2 1c LOC Commands: 0 2 Best Gaze: 1 3 Visual: 1 4 Facial Palsy: 2 right sided 5a Motor Arm - left: 0 5b Motor Arm - Right: 1 6a Motor Leg - Left: 0 6b Motor Leg - Right: 2 7 Limb Ataxia: 0 8 Sensory: 0 9 Best Language: 3 10 Dysarthria: 2 11 Extinct. and Inatten.: 0 TOTAL: 15  NIHSS after non-contrast CT 10:35 NIHSS components Score: Comment  1a Level of Conscious 0'[x]'  1'[]'  2'[]'  3'[]'         1b LOC Questions 0'[x]'  1'[]'  2'[]'           1c LOC Commands 0'[x]'  1'[]'  2'[]'           2 Best Gaze 0'[x]'  1'[]'  2'[]'           3 Visual 0'[x]'  1'[]'  2'[]'  3'[]'         4 Facial Palsy 0'[]'  1'[x]'  2'[]'  3'[]'       right sided  5a Motor Arm - left 0'[x]'  1'[]'  2'[]'  3'[]'  4'[]'  UN'[]'     5b Motor Arm - Right 0'[x]'  1'[]'  2'[]'  3'[]'  4'[]'  UN'[]'     6a Motor Leg - Left 0'[x]'  1'[]'  2'[]'  3'[]'  4'[]'  UN'[]'     6b Motor Leg - Right 0'[x]'  1'[]'  2'[]'  3'[]'  4'[]'  UN'[]'     7 Limb Ataxia 0'[x]'  1'[]'  2'[]'  3'[]'  UN'[]'       8 Sensory 0'[x]'  1'[]'  2'[]'  UN'[]'   When tested, sensation is reported as equal bilaterally to touch, vibration, and temperature though she reports numbness of the left leg without supporting physical exam findings.   9 Best Language 0'[]'  1'[x]'  2'[]'  3'[]'      With normal conversation initiated by patient, there is mild loss of fluency. When asked to repeat phrases, all communication is fragmentary   10 Dysarthria 0'[]'  1'[x]'  2'[]'  UN'[]'         11 Extinct. and Inattention 0'[x]'  1'[]'  2'[]'           TOTAL: 3        ROS: A complete ROS was performed and is negative except as noted in the HPI.   Past Medical History:  Diagnosis Date  . ESRD (end stage renal disease) ([place])    TTHS [place]   . Hypertension   . Stroke ([place]) 04/2017   no residual . Limp left side  . SVT (supraventricular tachycardia)  (HCC)    Family History  Problem Relation Age of Onset  . Heart attack Maternal Uncle   . Heart attack Maternal Grandfather   . Stroke Paternal Grandmother   . Cancer Father   . Hypertension Sister    Social History:  reports that she has never smoked. She has never used smokeless tobacco. She reports that she does not drink alcohol and does not use drugs.  Current Outpatient Medications  Medication Instructions  . acetaminophen (TYLENOL) 1,000 mg, Oral, Daily PRN  . ALPRAZolam (XANAX) 0.5 mg, Oral, Daily PRN  . amLODipine (NORVASC) 10 mg, Oral, Daily  . aspirin EC 81 mg, Oral, Daily  . atorvastatin (LIPITOR) 40 mg, Oral, Daily-1800  . calcitRIOL (ROCALTROL) 0.25 mcg, Oral, Daily  . carvedilol (COREG) 25 mg, Oral, 2 times daily with meals  . Cinacalcet HCl (SENSIPAR PO) 1 tablet, Oral, Daily  . cloNIDine (CATAPRES) 0.1 mg, Oral, 3 times daily  . clopidogrel (PLAVIX) 75 mg, Oral, Daily  . cyanocobalamin 1,000 mcg, Oral, Daily  . escitalopram (LEXAPRO) 10 mg, Oral, Daily  . ferrous sulfate 325 mg, Oral, 3 times daily with meals  . furosemide (LASIX) 80 mg, Oral, 2 times daily, Take with 40 mg to equal 120 mg twice daily  . hydrALAZINE (APRESOLINE) 100 mg, Oral, 3 times daily  . Meclizine HCl 25 mg, Oral, 3 times daily PRN  . multivitamin (RENA-VIT) TABS tablet 1 tablet, Oral, Daily  . omeprazole (PRILOSEC) 20 mg, Oral, Daily  . pantoprazole (PROTONIX) 40 mg, Oral, Daily  . [place] 3,200 mg, Oral, See admin instructions, Take 3200 mg with each meal and snack   Assessment/Exam: Current vital signs: BP (!) 163/84 (BP Location: Right Arm)   Pulse 90   Temp 97.9 F (36.6 C) (Oral)   Resp 20   Wt 112.6 kg   LMP 08/12/2017   SpO2 99%   BMI 37.75 kg/m   Physical Exam  Constitutional: Appears obese, well-developed and well-nourished.  Psych: Affect appropriate to situation Eyes: No scleral injection HENT: No OP obstruction. Normocephalic/atraumatic. Dry mm.  Cardiovascular:  Normal rate on cardiac monitor, extremities warm.  Respiratory: Effort normal, non-labored breathing.  GI: Soft.  Non-tender. Skin: WDI  Neuro: Mental Status: Initially, patient laying on CT table slightly drowsy from MRI sedation, appears aphasic/mute. On arrival back to inpatient room, she is awake and oriented to self, year, place but amnestic to recent events.  Patient is unable to give a clear and coherent history; repeatedly asks "what is going on?" Intermittently aphasic without neglect. Patient able to initiate  conversation and state clearly and coherently "will someone call my daughter, what is going on?", recall and recite name, age, month, year but is unable to repeat simple phrases when asked. She occasionally has trouble with word finding. Cranial Nerves: II: Visual Fields are full. Pupils are equal, round, and reactive to light 24m brisk. III,IV, VI: EOMI without ptosis or diploplia.  V: Facial sensation is symmetric to light touch and temperature.  VII: Facial movement is asymmetric with minor mouth droop  VIII: Hearing is intact to voice X: Uvula elevates symmetrically XI: Shoulder shrug is symmetric. XII: Tongue is idline without atrophy or fasciculations.   Motor: Tone is normal. Bulk is normal. 5/5 strength was present in all four extremities. Right upper extremity is slightly weaker than left upper extremity with grip, push/pull maneuver. Significant myoclonus noted on bilateral lower extremities right > left. Sensory: Sensation is symmetric to light touch, temperature, and vibration in the bilateral arms and legs.  Deep Tendon Reflexes: 2+ and symmetric in the biceps and 3+ patellae. Plantars: Toes are downgoing bilaterally.  Cerebellar: FNF and HKS are intact bilaterally  I have reviewed labs in epic and the pertinent results are: BUN 77, Cr 14.01 (ESRD) Phosphorous 9.8 (last HD 12/23) Troponin 81 Folate 10.3   I have reviewed the images obtained: 12/23 CTH  non-contrast IMPRESSION: No evidence of acute infarct or acute intracranial hemorrhage. ASPECTS is 10 (when discounting chronic infarcts).  Focus of calcification in the left sylvian fissure in the location of the proximal M2 left middle cerebral artery. This is a new finding as compared to the head CT of 03/10/2018. Differential considerations include calcified embolus versus interval development of calcified plaque at this site. Consider CTA for further evaluation (particularly given the provided history of speech difficulty).  Redemonstrated chronic cortically based infarcts within the right frontal and left parietal lobes.  Mild cerebral white matter chronic small vessel ischemic disease.  12/23 CT angio IMPRESSION: 1. Focal calcification within a proximal left M2 MCA branch, which correlates with the calcification seen on same day noncontrast head CT. This results in severe stenosis with opacification of the more distal branches. Differential considerations include calcified embolus (paritcularly given calcification was not evident on prior CT from 2019 and given the patient's reported speech difficulty and reported recent dialysis fistula intervention) versus progressive calcific atherosclerosis. 2. Intracranial evaluation is limited by venous contamination and motion with a suspected moderate stenosis of the distal right M1 MCA. 3. 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 Findings may be chronic given poor flow related signal of the right intradural vertebral artery on prior MRA head from 04/18/2017. 4. 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 5. Bilateral carotid bifurcation atherosclerosis without evidence of greater than 50% narrowing.  12/24 MRI brain IMPRESSION: 1. Small acute infarct along the  left frontal cortex. 2. 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  12/24: CT Head non-contrast IMPRESSION: 1. Small acute infarct in the left frontal lobe by brain MRI 2 hours prior. No acute hemorrhage. 2. Remote left parietal and right frontal infarcts. 3. Presumed calcified embolism in the low left sylvian fissure.  Primary Diagnosis:  Cerebral infarction due to embolism of  left middle cerebral artery.   Secondary Diagnosis: Essential (primary) hypertension, Morbid Obesity(BMI > 40), ESRD and Hyperphosphatemia  Impression:  1.  Left sided weakness and numbness on presentation 12/23  2. Acute right-sided hemiparesis following MRI with sedation, resolved.    - Weakness secondary to sedation vs. acute left frontal lobe infarct by MRI; deficit not present before MRI   - Weakness improved rapidly with increased alertness.   - Unable to obtain CTA head/neck or CT perfusion due to patient severe claustrophobia; with resolution of symptoms, decided to defer these images at this time 3.  Calcified plaque in the LMCA, non-occlusive 4.  Absence of right vertebral flow 5.  LICA bifurcation atherosclerosis, not hemodynamically compromising flow. 6.  I note that the LEFT ICA and MCA findings do not explain the new LEFT-sided sensory/motor changes; The MRA from 2019 suggested distal right vertebral stenosis, which now appears to have occluded without evidence of right-sided acute or chronic injury.  7.  Hoover's sign and give-way on exam; many intermittent findings that disappear with distraction (eye movements, speech quality, extremity strength, vision) 8.  COVID positive on initial PCR screen; being repeated  Recommendations: 1. Platelet function testing to determine response to clopidogrel + aspirin  - May need to transition to [place] with evidence of non-response to clopidogrel 2. Continue ASA/Plavix and atorvastatin 3. MRI positive for acute stroke:              A- repeat  echo             B- MTHFR             C- Syphilis screen             D- SPEP/IFE to rule out paraproteinemia (Waldenstrom's)             E- UDS for sympathomimetics             F- LINQ interrogation             G- expanded/repeated vasculitis screen (ESR/CRP/ANCA panel, etc.)  H- Frequent neuro checks q77mn x 2h; then q2h  4. Will need PT to assess, given apparent leg weakness  Assessment and plan discussed with attending provider and they are in agreement. SAnibal Henderson AGAC-NP [facility] [phone] Attestation:  I saw this patient with the APP on 04/01/20, obtained pertinent aspects of the history, and performed relevant physical and neurological examination as documented. Also, I reviewed the available laboratory data and neuroimages, and other relevant tests/notes/procedures.  My examination findings include rapidly improving status. Initially she was gazing to the left and was unable to keep her right arm up for the full 10 seconds. She could be coaxed to look past midline to the right. Her exam improved after the initial head CT so that she was able to hold her arms up without drift, was looking left/right without difficulty.  Impression: 1.  Recent left MCA territory stroke, small, occurring in the same territory as the MCA calcified plaque vs embolus seen on recent imaging. 2.  She is not eligible for tPA due to having symptoms for ~2 days, and having acute stroke on this morning's MRI. 3.  She was initially considered for mechanical thrombectomy, after developing worsening right hemiparesis, gaze paresis to the left, all suggesting completion of the LMCA syndrome. However, now that she has improved substantially to near-baseline, she is no longer a thrombectomy candidate. In my view, her management should be based on the SAMMPRIS trial, with 90 days of DAPT following stroke in the territory of a sub-occlusive intracranial stenosis.  Recommendations: 1.  Check  platelet function to make sure she is responding adequately to ASA and  clopidogrel 2.  Lab workup as outlined. 3.  Other recommendations as noted previously when seen on day of admission are still appropriate. 4.  If ASA/plavix platelet function is adequate (showing effective anti-platelet activity), then continue DAPT x 90 days followed by ASA 325 mg/d monotherapy. 5.  If plavix function is inadequate, suggest switching to ASA/brilinta dual anti-platelet therapy.   Thank you.  Perfecto Kingdom, MD

## 2020-04-01 NOTE — Progress Notes (Signed)
HD and MRI consent signed and placed in chart at this time. All metal removed from patient, face piecing's placed in plastic baggie and given to patient. Troponin drawn at this time. Will place consents I patient chart.

## 2020-04-01 NOTE — Progress Notes (Signed)
Occupational Therapy Evaluation  PTA pt independent with mobility and ADL. Daughter assists with medication management and drives her to dialysis T Th Sat. Pt requires min A with mobility and ADL @ RW level due to below listed deficits. Recommend DC home with assistance for mobility and ADL by family with follow up with OT at the neuro outpt center to facilitate return to independence. VSS during session on RA. Will follow acutely.    04/01/20 1523  OT Visit Information  Last OT Received On 04/01/20  Assistance Needed +1  History of Present Illness Pt is a 54 y.o. female admitted 03/31/20 with expressive aphasia and L-side numbness; pt also (+) COVID-19. Not a tPA candidate. CT with possible calcification and CTA concerning for high-grade but non-occlusive calcified embolus. MRI with anesthesia 12/24 whoed small acute infarct in L frontal cortex; remote L parietal and R frontal cortex infarcts. Code stroke activated due to R-side hemiparesis following sedation; CT negative for new infarct and symptoms improved. PMH includes prior strokes (residual L-side weakness), ESRD (HD TTS), HTN, has loop recorder.  Precautions  Precautions Fall;Other (comment)  Precaution Comments Permissive BP up to 220/110  Home Living  Family/patient expects to be discharged to: Private residence  Living Arrangements Other relatives  Available Help at Discharge Family;Available 24 hours/day  Type of Home Apartment  Home Access Level entry  Home Layout One level  Bathroom Shower/Tub Tub/shower unit;Curtain  Corporate treasurer Yes  How Accessible Accessible via walker  Home Equipment None  Prior Function  Level of Independence Independent  Comments Does not work. Reports she does drive, but daughter typically drives to HD. Daughter assists as needed, including medication management  Communication  Communication Expressive difficulties  Pain Assessment  Pain Assessment No/denies pain   Cognition  Arousal/Alertness Awake/alert  Behavior During Therapy WFL for tasks assessed/performed  Overall Cognitive Status No family/caregiver present to determine baseline cognitive functioning  Current Attention Level Selective  General Comments will further assess; unable to recall medications - unsure if this is baseline given prior strokes  Upper Extremity Assessment  Upper Extremity Assessment Overall WFL for tasks assessed  Lower Extremity Assessment  Lower Extremity Assessment Defer to PT evaluation (LLE weakness)  Cervical / Trunk Assessment  Cervical / Trunk Assessment Normal  ADL  Overall ADL's  Needs assistance/impaired  Eating/Feeding Details (indicate cue type and reason) diet being assessed by ST; able to open containers and feed self  Grooming Set up;Sitting  Upper Body Bathing Set up;Sitting  Lower Body Bathing Minimal assistance;Sit to/from stand  Upper Body Dressing  Set up;Sitting  Lower Body Dressing Moderate assistance;Sit to/from Retail buyer Minimal assistance;Ambulation;BSC  Toileting- Clothing Manipulation and Hygiene Minimal assistance;Sit to/from stand  Functional mobility during ADLs Minimal assistance;Rolling walker  Vision- History  Baseline Vision/History Wears glasses  Wears Glasses Reading only  Patient Visual Report No change from baseline  Vision- Assessment  Vision Assessment? No apparent visual deficits  Perception  Comments no apparent visual deficits  Praxis  Praxis tested? WFL  Bed Mobility  General bed mobility comments OOB in chair  Transfers  Overall transfer level Needs assistance  Equipment used Rolling walker (2 wheeled)  Transfers Sit to/from Stand  Sit to Stand Min assist  General transfer comment poorly controlled descent; most likely "falls" into chair at baseline  Balance  Sitting balance-Leahy Scale Good  Standing balance-Leahy Scale Poor  General Comments  General comments (skin integrity, edema, etc.)  pt will have 24/7  S  OT - End of Session  Equipment Utilized During Treatment Gait belt;Rolling walker  Activity Tolerance Patient tolerated treatment well  Patient left in chair;with call bell/phone within reach;with chair alarm set  Nurse Communication Mobility status  OT Assessment  OT Recommendation/Assessment Patient needs continued OT Services  OT Visit Diagnosis Unsteadiness on feet (R26.81);Other abnormalities of gait and mobility (R26.89);Muscle weakness (generalized) (M62.81);Other symptoms and signs involving cognitive function  OT Problem List Decreased strength;Decreased range of motion;Impaired balance (sitting and/or standing);Decreased knowledge of use of DME or AE;Decreased safety awareness;Obesity  OT Plan  OT Frequency (ACUTE ONLY) Min 2X/week  OT Treatment/Interventions (ACUTE ONLY) Self-care/ADL training;Therapeutic exercise;Neuromuscular education;DME and/or AE instruction;Therapeutic activities;Patient/family education;Balance training  AM-PAC OT "6 Clicks" Daily Activity Outcome Measure (Version 2)  Help from another person eating meals? 4  Help from another person taking care of personal grooming? 3  Help from another person toileting, which includes using toliet, bedpan, or urinal? 3  Help from another person bathing (including washing, rinsing, drying)? 3  Help from another person to put on and taking off regular upper body clothing? 3  Help from another person to put on and taking off regular lower body clothing? 2  6 Click Score 18  OT Recommendation  Follow Up Recommendations Outpatient OT;Supervision/Assistance - 24 hour (initiallyl neuro outpt)  OT Equipment 3 in 1 bedside commode;Tub/shower bench  Individuals Consulted  Consulted and Agree with Results and Recommendations Patient  Acute Rehab OT Goals  Patient Stated Goal "I want to go home"  OT Goal Formulation With patient  Time For Goal Achievement 04/15/20  Potential to Achieve Goals Good  OT Time  Calculation  OT Start Time (ACUTE ONLY) 1345  OT Stop Time (ACUTE ONLY) 1406  OT Time Calculation (min) 21 min  OT General Charges  $OT Visit 1 Visit  OT Evaluation  $OT Eval Moderate Complexity 1 Mod  Written Expression  Dominant Hand Right  Maurie Boettcher, OT/L   Acute OT Clinical Specialist Acute Rehabilitation Services Pager [phone] Office [phone]

## 2020-04-01 NOTE — Progress Notes (Signed)
PT Cancellation Note  Patient Details Name: Erin Dean MRN: [id number] DOB: [birth year]   Cancelled Treatment:    Reason Eval/Treat Not Completed: Medical issues which prohibited therapy. Pt back from MRI with anesthesia, not medically ready to participate in PT Evaluation. Will follow-up as schedule permits.  Mabeline Caras, PT, DPT Acute Rehabilitation Services  Pager [phone] Office [place] 04/01/2020, 10:03 AM

## 2020-04-01 NOTE — TOC Initial Note (Signed)
Transition of Care [facility]) - Initial/Assessment Note    Patient Details  Name: Erin Dean MRN: [id number] Date of Birth: Jul 31, 1965  Transition [facility] Care [facility]) CM/SW Contact:    Verdell Carmine, RN Phone Number: 04/01/2020, 10:23 AM  Clinical Narrative:                  54 year old in with possible stroke, out of TPA window, tested positive for OCVID incidentally. Has had previous stroke and has had workup for coagulation deficits. ESRD on dialysis. Had MRI sedated with anesthesia revealing acute stroke. Currently on ASA plavix, heparin, will need to add statin on dc per recommendations. Placement could potentially be SNF bridge to home then with home health. PT consulted. CM will follow  Expected Discharge Plan: [place] Barriers to Discharge: Continued Medical Work up   Patient Goals and CMS Choice        Expected Discharge Plan and Services Expected Discharge Plan: [place] In-house Referral: Clinical Social Work Discharge Planning Services: CM Consult   Living arrangements for the past 2 months: Apartment                                      Prior Living Arrangements/Services Living arrangements for the past 2 months: Apartment Lives with:: Self Patient language and need for interpreter reviewed:: Yes        Need for Family Participation in Patient Care: Yes (Comment) Care giver support system in place?: Yes (comment)      Activities of Daily Living      Permission Sought/Granted                  Emotional Assessment       Orientation: : Oriented to Situation,Oriented to  Time,Oriented to Place,Oriented to Self   Psych Involvement: No (comment)  Admission diagnosis:  Acute CVA (cerebrovascular accident) [facility]) [I63.9] Patient Active Problem List   Diagnosis Date Noted  . Acute CVA (cerebrovascular accident) ([place]) 03/31/2020  . COVID-19 virus infection 03/31/2020  . Chronic diastolic (congestive)  heart failure ([place]) 03/31/2020  . ESRD on dialysis ([place]) 05/06/2018  . Hyperlipidemia 03/11/2018  . Family hx-stroke 03/11/2018  . CKD (chronic kidney disease) stage 5, GFR less than 15 ml/min (HCC) 07/31/2017  . Cryptogenic stroke ([place])   . Hypertensive emergency   . Demand ischemia ([place])   . Stroke-like episode ([place]) s/p tPA 04/17/2017  . Essential hypertension 12/01/2015  . Obesity 12/01/2015  . Lower extremity edema 12/01/2015  . Heart murmur 12/01/2015  . Hypertensive urgency 09/22/2012  . Anxiety 09/22/2012   PCP:  Lois Huxley, PA Pharmacy:   [facility] [place], [place] AT [facility] [address] [place] [address] Phone: [phone] Fax: [phone]     Social Determinants of Health (SDOH) Interventions    Readmission Risk Interventions No flowsheet data found.

## 2020-04-01 NOTE — Progress Notes (Signed)
Patient ID: Erin Dean, female   DOB: Apr 20, 1965, 54 y.o.   MRN: [id number] [facility] Progress Note   Assessment/ Plan:   1.  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  Upon performing a noncontrast CT scan of the head, the patient had spontaneous neurological improvement and did not get additional imaging or intervention.  She is ongoing frequent neurological checks. 2.  Covid infection: She has been vaccinated (but has not received a booster).  Does not appear to be symptomatic from a respiratory standpoint and is currently getting remdesivir. 3. ESRD: She is usually on a TTS dialysis schedule and is currently off schedule with plans to undertake dialysis later today with Covid isolation protocol. 4. Anemia: Without overt blood loss and currently hemoglobin and hematocrit within acceptable range. 5. CKD-MBD: Significantly elevated phosphorus level noted, continue Sensipar and calcitriol for PTH control and resume binders when appropriate able to eat. 6. Nutrition: Currently n.p.o. for aspiration precautions given variable neurological symptoms. 7. Hypertension: Blood pressure elevated, resume antihypertensive therapy and monitor with hemodialysis.  Subjective:   Events from overnight noted with initial improvement of neurological symptoms followed by worsening this morning.   Objective:   BP (!) 178/96 (BP Location: Right Arm)   Pulse 71   Temp 98.1 F (36.7 C) (Oral)   Resp 15   Wt 112.6 kg   LMP 08/12/2017   SpO2 98%   BMI 37.75 kg/m   Physical Exam: Gen: Appears to be comfortable resting in bed CVS: Pulse regular rhythm, normal rate, S1 and S2 normal Resp: Anteriorly clear to auscultation, no  distinct rales or rhonchi Abd: Soft, obese, nontender Ext: No lower extremity edema.  Right brachiocephalic fistula.  Labs: BMET Recent Labs  Lab 03/31/20 0616 03/31/20 0622 04/01/20 0115 04/01/20 0418 04/01/20 0718 04/01/20 1120  NA 137 138 135  --   --   --   K 5.6* 5.5* 6.4* 4.9 4.9 5.4*  CL 94* 96* 93*  --   --   --   CO2 25  --  23  --   --   --   GLUCOSE 94 88 123*  --   --   --   BUN 68* 69* 77*  --   --   --   CREATININE 12.72* 13.00* 14.01*  --   --   --   CALCIUM 7.5*  --  7.8*  --   --   --   PHOS  --   --  9.8*  --   --   --    CBC Recent Labs  Lab 03/31/20 0616 03/31/20 0622 04/01/20 0115  WBC 8.6  --  7.6  NEUTROABS 4.8  --  6.7  HGB 11.0* 11.6* 10.4*  HCT 34.2* 34.0* 31.2*  MCV 96.9  --  93.1  PLT 198  --  210     Medications:    .  stroke: mapping our early stages of recovery book   Does not apply Once  . albuterol  2 puff Inhalation Q6H  . vitamin C  500 mg Oral Daily  . aspirin EC  81 mg Oral Daily  . atorvastatin  80 mg Oral q1800  . calcitRIOL  3.75 mcg Oral Q M,W,F-HD  . Chlorhexidine Gluconate  Cloth  6 each Topical [id number]  . cinacalcet  180 mg Oral Q M,W,F-HD  . clopidogrel  75 mg Oral Daily  . escitalopram  10 mg Oral Daily  . heparin  5,000 Units Subcutaneous Q8H  . insulin aspart  0-6 Units Subcutaneous TID WC  . pantoprazole  40 mg Oral Daily  . sevelamer carbonate  3,200 mg Oral TID WC  . zinc sulfate  220 mg Oral Daily   Elmarie Shiley, MD 04/01/2020, 1:42 PM

## 2020-04-01 NOTE — Progress Notes (Signed)
SLP Cancellation Note  Patient Details Name: Erin Dean MRN: [id number] DOB: October 16, 1965   Cancelled treatment:       Reason Eval/Treat Not Completed: Medical issues which prohibited therapy. Pt in testing this am with complication with CVA. Will f/u for cognitive linguistic testing at a later date.    Shade Kaley, Katherene Ponto 04/01/2020, 12:29 PM

## 2020-04-01 NOTE — Anesthesia Preprocedure Evaluation (Signed)
Anesthesia Evaluation  Patient identified by MRN, date of birth, ID band Patient awake    Reviewed: Allergy & Precautions, NPO status , Patient's Chart, lab work & pertinent test results  Airway Mallampati: III  TM Distance: >3 FB Neck ROM: Full    Dental  (+) Teeth Intact   Pulmonary neg pulmonary ROS,    Pulmonary exam normal        Cardiovascular hypertension, Pt. on medications and Pt. on home beta blockers + dysrhythmias Supra Ventricular Tachycardia  Rhythm:Regular Rate:Normal     Neuro/Psych Anxiety CVA, No Residual Symptoms    GI/Hepatic Neg liver ROS, GERD  Medicated and Controlled,  Endo/Other  negative endocrine ROS  Renal/GU ESRF and DialysisRenal diseaseT/Thr/Sa  negative genitourinary   Musculoskeletal negative musculoskeletal ROS (+)   Abdominal (+)  Abdomen: soft. Bowel sounds: normal.  Peds  Hematology negative hematology ROS (+)   Anesthesia Other Findings   Reproductive/Obstetrics                             Anesthesia Physical Anesthesia Plan  ASA: IV  Anesthesia Plan: General   Post-op Pain Management:    Induction:   PONV Risk Score and Plan: 3 and Ondansetron, Dexamethasone and Treatment may vary due to age or medical condition  Airway Management Planned: Mask and Oral ETT  Additional Equipment: None  Intra-op Plan:   Post-operative Plan: Extubation in OR  Informed Consent: I have reviewed the patients History and Physical, chart, labs and discussed the procedure including the risks, benefits and alternatives for the proposed anesthesia with the patient or authorized representative who has indicated his/her understanding and acceptance.     Dental advisory given  Plan Discussed with: CRNA  Anesthesia Plan Comments: (Lab Results      Component                Value               Date                      WBC                      7.6                  04/01/2020                HGB                      10.4 (L)            04/01/2020                HCT                      31.2 (L)            04/01/2020                MCV                      93.1                04/01/2020                PLT  210                 04/01/2020           Lab Results      Component                Value               Date                      NA                       135                 04/01/2020                K                        4.9                 04/01/2020                CO2                      23                  04/01/2020                GLUCOSE                  123 (H)             04/01/2020                BUN                      77 (H)              04/01/2020                CREATININE               14.01 (H)           04/01/2020                CALCIUM                  7.8 (L)             04/01/2020                GFRNONAA                 3 (L)               04/01/2020                GFRAA                    8 (L)               03/11/2018           Covid-19 Nucleic Acid Test Results Lab Results      Component                Value               Date  SARSCOV2NAA              POSITIVE (A)        03/31/2020                SARSCOV2NAA              POSITIVE (A)        03/31/2020                [place]              NEGATIVE            10/06/2018          )        Anesthesia Quick Evaluation

## 2020-04-01 NOTE — Progress Notes (Signed)
Contacted on call provider at this time, to notify of critical lab value K+ 6.4. Will await orders at this time.

## 2020-04-01 NOTE — ED Provider Notes (Signed)
[facility] Provider Note   CSN: [id number] Arrival date & time: 03/31/20  0547     History Chief Complaint  Patient presents with  . Cerebrovascular Accident    Erin Dean is a 54 y.o. female.  Patient went to bed normal last night and woke up this morning to go to dialysis and noticed that she could not speak well and her left-sided did not work very well.  Her daughter called her and said that she is only speaking gibberish and she could not understand anything that she said is she has significant dysarthria it sounds like.  EMS was called on their arrival she has a very significant expressive aphasia, left-sided leg weakness.  No code stroke as her last known normal was around 2100.  Brought here for further evaluation.  Patient seems to have some improvement now.  She is able to speak to me and give me a partial history and states that she is feeling better than she did earlier.  Of note patient had some type of fistula manipulation yesterday but is unclear what that was or where that was.   Cerebrovascular Accident This is a new problem. The current episode started 6 to 12 hours ago. The problem occurs constantly. The problem has been gradually improving. Nothing aggravates the symptoms. Nothing relieves the symptoms.       Past Medical History:  Diagnosis Date  . ESRD (end stage renal disease) ([place])    [facility] [place]   . Hypertension   . Stroke ([place]) 04/2017   no residual . Limp left side  . SVT (supraventricular tachycardia) [facility])     Patient Active Problem List   Diagnosis Date Noted  . Acute CVA (cerebrovascular accident) ([place]) 03/31/2020  . COVID-19 virus infection 03/31/2020  . Chronic diastolic (congestive) heart failure ([place]) 03/31/2020  . ESRD on dialysis ([place]) 05/06/2018  . Hyperlipidemia 03/11/2018  . Family hx-stroke 03/11/2018  . CKD (chronic kidney disease) stage 5, GFR less than 15 ml/min (HCC) 07/31/2017  . Cryptogenic  stroke ([place])   . Hypertensive emergency   . Demand ischemia ([place])   . Stroke-like episode ([place]) s/p tPA 04/17/2017  . Essential hypertension 12/01/2015  . Obesity 12/01/2015  . Lower extremity edema 12/01/2015  . Heart murmur 12/01/2015  . Hypertensive urgency 09/22/2012  . Anxiety 09/22/2012    Past Surgical History:  Procedure Laterality Date  . AV FISTULA PLACEMENT Right 08/16/2017   Procedure: CREATION OF RADIOCEPHALIC VERSUS BRACHIOCEPHALIC ARTERIOVENOUS FISTULA RIGHT ARM;  Surgeon: Conrad Renville, MD;  Location: [place];  Service: Vascular;  Laterality: Right;  . AV FISTULA PLACEMENT Right 02/07/2018   Procedure: Creation Right arm Brachiocephalic Fistula;  Surgeon: Marty Heck, MD;  Location: [facility] OR;  Service: Vascular;  Laterality: Right;  . AV FISTULA PLACEMENT Left 08/15/2018   Procedure: ARTERIOVENOUS (AV) FISTULA CREATION;  Surgeon: Serafina Mitchell, MD;  Location: [place];  Service: Vascular;  Laterality: Left;  . BASCILIC VEIN TRANSPOSITION Right 04/07/2018   Procedure: SECOND STAGE BASILIC VEIN TRANSPOSITION RIGHT ARM;  Surgeon: Marty Heck, MD;  Location: [place];  Service: Vascular;  Laterality: Right;  . CESAREAN SECTION    . ESOPHAGOGASTRODUODENOSCOPY N/A 04/19/2017   Procedure: ESOPHAGOGASTRODUODENOSCOPY (EGD);  Surgeon: Carol Ada, MD;  Location: [place];  Service: Endoscopy;  Laterality: N/A;  . FISTULA SUPERFICIALIZATION Right 10/01/2017   Procedure: FISTULA SUPERFICIALIZATION RIGHT ARM;  Surgeon: Conrad McRae, MD;  Location: [place];  Service: Vascular;  Laterality: Right;  . FISTULA SUPERFICIALIZATION Left 10/08/2018   Procedure: FISTULA SUPERFICIALIZATION LEFT ARM;  Surgeon: Serafina Mitchell, MD;  Location: [place];  Service: Vascular;  Laterality: Left;  . IR FLUORO GUIDE CV LINE RIGHT  04/23/2018  . IR US GUIDE VASC ACCESS RIGHT  04/23/2018  . LOOP RECORDER INSERTION N/A 04/22/2017   Procedure: LOOP RECORDER INSERTION;  Surgeon: Constance Haw,  MD;  Location: [place] CV LAB;  Service: Cardiovascular;  Laterality: N/A;  . TEE WITHOUT CARDIOVERSION N/A 04/19/2017   Procedure: TRANSESOPHAGEAL ECHOCARDIOGRAM (TEE);  Surgeon: Carol Ada, MD;  Location: [place];  Service: Endoscopy;  Laterality: N/A;     OB History   No obstetric history on file.     Family History  Problem Relation Age of Onset  . Heart attack Maternal Uncle   . Heart attack Maternal Grandfather   . Stroke Paternal Grandmother   . Cancer Father   . Hypertension Sister     Social History   Tobacco Use  . Smoking status: Never Smoker  . Smokeless tobacco: Never Used  Vaping Use  . Vaping Use: Never used  Substance Use Topics  . Alcohol use: No  . Drug use: No    Home Medications Prior to Admission medications   Medication Sig Start Date End Date Taking? Authorizing Provider  acetaminophen (TYLENOL) 500 MG tablet Take 2 tablets (1,000 mg total) by mouth daily as needed for moderate pain or headache. 03/11/18  Yes Donzetta Starch, NP  ALPRAZolam Duanne Moron) 0.5 MG tablet Take 0.5 mg by mouth daily as needed for anxiety.  07/16/18  Yes [provider]  amLODipine (NORVASC) 10 MG tablet Take 1 tablet (10 mg total) by mouth daily. 03/11/18  Yes Donzetta Starch, NP  aspirin EC 81 MG tablet Take 81 mg by mouth daily.    Yes [provider]  atorvastatin (LIPITOR) 40 MG tablet Take 1 tablet (40 mg total) by mouth daily at 6 PM. 03/11/18  Yes Biby, Massie Kluver, NP  calcitRIOL (ROCALTROL) 0.25 MCG capsule Take 1 capsule (0.25 mcg total) by mouth daily. 03/11/18  Yes Donzetta Starch, NP  carvedilol (COREG) 25 MG tablet Take 1 tablet (25 mg total) by mouth 2 (two) times daily with a meal. 03/11/18  Yes Donzetta Starch, NP  Cinacalcet HCl (SENSIPAR PO) Take 1 tablet by mouth daily. 10/31/19 10/29/20 Yes [provider]  cloNIDine (CATAPRES) 0.1 MG tablet Take 1 tablet (0.1 mg total) by mouth 3 (three) times daily. 03/11/18  Yes Donzetta Starch, NP   clopidogrel (PLAVIX) 75 MG tablet Take 1 tablet (75 mg total) by mouth daily. 03/12/18  Yes Donzetta Starch, NP  cyanocobalamin 1000 MCG tablet Take 1 tablet (1,000 mcg total) by mouth daily. 03/11/18  Yes Donzetta Starch, NP  escitalopram (LEXAPRO) 10 MG tablet Take 1 tablet (10 mg total) by mouth daily. 03/11/18  Yes Donzetta Starch, NP  ferrous sulfate 325 (65 FE) MG tablet Take 1 tablet (325 mg total) by mouth 3 (three) times daily with meals. 03/11/18  Yes Donzetta Starch, NP  furosemide (LASIX) 80 MG tablet Take 1 tablet (80 mg total) by mouth 2 (two) times daily. Take with 40 mg to equal 120 mg twice daily Patient taking differently: Take 80 mg by mouth 2 (two) times daily. 03/11/18  Yes Donzetta Starch, NP  hydrALAZINE (APRESOLINE) 100 MG tablet Take 1 tablet (100 mg total) by mouth 3 (three) times daily.  03/11/18  Yes Donzetta Starch, NP  multivitamin (RENA-VIT) TABS tablet Take 1 tablet by mouth daily.  05/01/18  Yes [provider]  omeprazole (PRILOSEC) 20 MG capsule Take 20 mg by mouth daily. 02/12/19  Yes [provider]  pantoprazole (PROTONIX) 40 MG tablet Take 1 tablet (40 mg total) by mouth daily. 03/11/18  Yes Donzetta Starch, NP  RENAGEL 800 MG tablet Take 3,200 mg by mouth See admin instructions. Take 3200 mg with each meal and snack 07/23/18  Yes [provider]  Meclizine HCl 25 MG CHEW Chew 25 mg by mouth 3 (three) times daily as needed.    [provider]    Allergies    Chlorhexidine gluconate  Review of Systems   Review of Systems  All other systems reviewed and are negative.   Physical Exam Updated Vital Signs BP (!) 154/76 (BP Location: Right Wrist)   Pulse 62   Temp 98.6 F (37 C) (Oral)   Resp (!) 23   Wt 113.9 kg Comment: Patient reported  LMP 08/12/2017   SpO2 92%   BMI 38.16 kg/m   Physical Exam Vitals and nursing note reviewed.  Constitutional:      Appearance: She is well-developed and well-nourished.  HENT:     Head:  Normocephalic and atraumatic.     Nose: Nose normal. No congestion or rhinorrhea.     Mouth/Throat:     Mouth: Mucous membranes are moist.  Eyes:     Pupils: Pupils are equal, round, and reactive to light.  Cardiovascular:     Rate and Rhythm: Normal rate and regular rhythm.  Pulmonary:     Effort: No respiratory distress.     Breath sounds: No stridor.  Abdominal:     General: Abdomen is flat. There is no distension.  Musculoskeletal:     Cervical back: Normal range of motion.  Skin:    General: Skin is warm and dry.  Neurological:     Mental Status: She is alert.     Comments: Initially patient with difficulty with word finding.  But no demonstrable weakness in arms or legs.  She had difficulty lifting her toes with her head on the left side but this seemed to be difficulty following directions rather than weakness.     ED Results / Procedures / Treatments   Labs (all labs ordered are listed, but only abnormal results are displayed) Labs Reviewed  RESP PANEL BY RT-PCR (FLU A&B, COVID) ARPGX2 - Abnormal; Notable for the following components:      Result Value   SARS Coronavirus 2 by RT PCR POSITIVE (*)    All other components within normal limits  RESP PANEL BY RT-PCR (FLU A&B, COVID) ARPGX2 - Abnormal; Notable for the following components:   SARS Coronavirus 2 by RT PCR POSITIVE (*)    All other components within normal limits  CBC - Abnormal; Notable for the following components:   RBC 3.53 (*)    Hemoglobin 11.0 (*)    HCT 34.2 (*)    All other components within normal limits  DIFFERENTIAL - Abnormal; Notable for the following components:   Monocytes Absolute 1.5 (*)    Abs Immature Granulocytes 0.11 (*)    All other components within normal limits  COMPREHENSIVE METABOLIC PANEL - Abnormal; Notable for the following components:   Potassium 5.6 (*)    Chloride 94 (*)    BUN 68 (*)    Creatinine, Ser 12.72 (*)    Calcium  7.5 (*)    Total Protein 8.3 (*)    Albumin  3.1 (*)    GFR, Estimated 3 (*)    Anion gap 18 (*)    All other components within normal limits  C-REACTIVE PROTEIN - Abnormal; Notable for the following components:   CRP 2.2 (*)    All other components within normal limits  D-DIMER, QUANTITATIVE (NOT AT [facility]) - Abnormal; Notable for the following components:   D-Dimer, Quant 1.16 (*)    All other components within normal limits  FERRITIN - Abnormal; Notable for the following components:   Ferritin 1,298 (*)    All other components within normal limits  FIBRINOGEN - Abnormal; Notable for the following components:   Fibrinogen 617 (*)    All other components within normal limits  LACTATE DEHYDROGENASE - Abnormal; Notable for the following components:   LDH 193 (*)    All other components within normal limits  LIPID PANEL - Abnormal; Notable for the following components:   HDL 33 (*)    All other components within normal limits  CBC WITH DIFFERENTIAL/PLATELET - Abnormal; Notable for the following components:   RBC 3.35 (*)    Hemoglobin 10.4 (*)    HCT 31.2 (*)    All other components within normal limits  COMPREHENSIVE METABOLIC PANEL - Abnormal; Notable for the following components:   Potassium 6.4 (*)    Chloride 93 (*)    Glucose, Bld 123 (*)    BUN 77 (*)    Creatinine, Ser 14.01 (*)    Calcium 7.8 (*)    Albumin 3.0 (*)    GFR, Estimated 3 (*)    Anion gap 19 (*)    All other components within normal limits  C-REACTIVE PROTEIN - Abnormal; Notable for the following components:   CRP 2.6 (*)    All other components within normal limits  D-DIMER, QUANTITATIVE (NOT AT [facility]) - Abnormal; Notable for the following components:   D-Dimer, Quant 1.33 (*)    All other components within normal limits  FERRITIN - Abnormal; Notable for the following components:   Ferritin 1,420 (*)    All other components within normal limits  PHOSPHORUS - Abnormal; Notable for the following components:   Phosphorus 9.8 (*)    All other  components within normal limits  GLUCOSE, CAPILLARY - Abnormal; Notable for the following components:   Glucose-Capillary 104 (*)    All other components within normal limits  GLUCOSE, CAPILLARY - Abnormal; Notable for the following components:   Glucose-Capillary 126 (*)    All other components within normal limits  I-STAT CHEM 8, ED - Abnormal; Notable for the following components:   Potassium 5.5 (*)    Chloride 96 (*)    BUN 69 (*)    Creatinine, Ser 13.00 (*)    Calcium, Ion 0.87 (*)    Hemoglobin 11.6 (*)    HCT 34.0 (*)    All other components within normal limits  CBG MONITORING, ED - Abnormal; Notable for the following components:   Glucose-Capillary 100 (*)    All other components within normal limits  PROTIME-INR  APTT  HIV ANTIBODY (ROUTINE TESTING W REFLEX)  PROCALCITONIN  HEMOGLOBIN A1C  TSH  RAPID URINE DRUG SCREEN, HOSP PERFORMED  POTASSIUM  POTASSIUM  POTASSIUM  POTASSIUM  POTASSIUM  POTASSIUM    EKG None  Radiology CT Angio Head W or Wo Contrast  Addendum Date: 03/31/2020   ADDENDUM REPORT: 03/31/2020 10:03 ADDENDUM: Findings discussed with  Dr. Lorin Mercy at 10 a.m. via telephone. Electronically Signed   By: Margaretha Sheffield MD   On: 03/31/2020 10:03   Result Date: 03/31/2020 CLINICAL DATA:  Neuro deficit, acute stroke suspected. Left-sided weakness, slurred speech. EXAM: CT ANGIOGRAPHY HEAD AND NECK TECHNIQUE: Multidetector CT imaging of the head and neck was performed using the standard protocol during bolus administration of intravenous contrast. Multiplanar CT image reconstructions and MIPs were obtained to evaluate the vascular anatomy. Carotid stenosis measurements (when applicable) are obtained utilizing NASCET criteria, using the distal internal carotid diameter as the denominator. CONTRAST:  24mL OMNIPAQUE IOHEXOL 350 MG/ML SOLN COMPARISON:  Same day CT head.  MR/MRA from 04/18/2017. FINDINGS: CTA NECK FINDINGS Aortic arch: Great vessel origins are  patent. Right carotid system: No evidence of dissection, stenosis (50% or greater) or occlusion. Left carotid system: Mixed calcific and noncalcific atherosclerosis at the carotid bifurcation without greater than 50% narrowing. 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 Suspected severe stenosis at the C2 level Skeleton: No acute findings. Other neck: No mass or suspicious adenopathy. Upper chest: No consolidation. Mild ground-glass opacities which may relate to poor inspiration. Review of the MIP images confirms the above findings CTA HEAD FINDINGS Evaluation is limited by venous contrast contamination and motion. Within this limitation: Anterior circulation: Bilateral internal carotid arteries are patent. Suspected moderate stenosis of the distal right M1 MCA. Proximal right M2 MCAs appear patent with limited evaluation for stenosis. There is focal calcification of a proximal left M2 MCA branch, which correlates with the calcification seen on same day noncontrast head CT. The bilateral A1 and proximal A2 ACAs are patent. Evaluation for stenosis is limited given venous contamination. Posterior circulation: Poor opacification of the intradural right vertebral artery with severe stenosis of the distal V4 vertebral artery. The left intradural vertebral artery is patent. The basilar artery is patent. Bilateral PCAs are patent proximally without evidence of proximal hemodynamically significant stenosis. More distal PCAs are not well visualized secondary to venous contamination, but appear patent. Venous sinuses: As permitted by contrast timing, patent. Review of the MIP images confirms the above findings IMPRESSION: 1. Focal calcification within a proximal left M2 MCA branch, which correlates with the calcification seen on same day noncontrast head CT. This results in severe stenosis with opacification of the more distal  branches. Differential considerations include calcified embolus (paritcularly given calcification was not evident on prior CT from 2019 and given the patient's reported speech difficulty and reported recent dialysis fistula intervention) versus progressive calcific atherosclerosis. 2. Intracranial evaluation is limited by venous contamination and motion with a suspected moderate stenosis of the distal right M1 MCA. 3. 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 Findings may be chronic given poor flow related signal of the right intradural vertebral artery on prior MRA head from 04/18/2017. 4. 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 5. Bilateral carotid bifurcation atherosclerosis without evidence of greater than 50% narrowing. Electronically Signed: By: Margaretha Sheffield MD On: 03/31/2020 09:50   CT HEAD WO CONTRAST  Result Date: 03/31/2020 CLINICAL DATA:  Neuro deficit, acute, stroke suspected; headache, classic migraine. Additional history provided: 2 prior strokes, left-sided weakness, slurred speech, right frontal headache. EXAM: CT HEAD WITHOUT CONTRAST TECHNIQUE: Contiguous axial images were obtained from the base of the skull through the vertex without intravenous contrast.  COMPARISON:  Prior brain MRI 03/11/2018. FINDINGS: Brain: Redemonstrated chronic infarcts within the right frontal lobe (for instance as seen on series 3, image 20) and left parietal lobe. Background cerebral volume is normal. Mild ill-defined hypoattenuation within the cerebral white matter is nonspecific, but compatible with chronic small vessel ischemic disease. Known small chronic infarcts within the bilateral cerebellar hemispheres were better appreciated on the prior MRI of 03/11/2018. There is no acute intracranial hemorrhage. No acute demarcated cortical infarct  is identified. No extra-axial fluid collection. No evidence of intracranial mass. No midline shift. Vascular: Atherosclerotic calcifications. There is a focus of calcification in the region of the proximal M2 left middle cerebral artery (in the left sylvian fissure) (series 3, image 11). Skull: Normal. Negative for fracture or focal lesion. Sinuses/Orbits: Visualized orbits show no acute finding. Mild right ethmoid sinus mucosal thickening. ASPECTS [facility] Stroke Program Early CT Score) - Ganglionic level infarction (caudate, lentiform nuclei, internal capsule, insula, M1-M3 cortex): 7 - Supraganglionic infarction (M4-M6 cortex): 3 Total score (0-10 with 10 being normal): 10 (when discounting chronic infarcts). These results were called by telephone at the time of interpretation on 03/31/2020 at 7:45 am to provider [facility] , who verbally acknowledged these results. IMPRESSION: No evidence of acute infarct or acute intracranial hemorrhage. ASPECTS is 10 (when discounting chronic infarcts). Focus of calcification in the left sylvian fissure in the location of the proximal M2 left middle cerebral artery. This is a new finding as compared to the head CT of 03/10/2018. Differential considerations include calcified embolus versus interval development of calcified plaque at this site. Consider CTA for further evaluation (particularly given the provided history of speech difficulty). Redemonstrated chronic cortically based infarcts within the right frontal and left parietal lobes. Mild cerebral white matter chronic small vessel ischemic disease. Electronically Signed   By: Kellie Simmering DO   On: 03/31/2020 07:58   CT Angio Neck W and/or Wo Contrast  Addendum Date: 03/31/2020   ADDENDUM REPORT: 03/31/2020 10:03 ADDENDUM: Findings discussed with Dr. Lorin Mercy at 10 a.m. via telephone. Electronically Signed   By: Margaretha Sheffield MD   On: 03/31/2020 10:03   Result Date: 03/31/2020 CLINICAL DATA:  Neuro deficit, acute  stroke suspected. Left-sided weakness, slurred speech. EXAM: CT ANGIOGRAPHY HEAD AND NECK TECHNIQUE: Multidetector CT imaging of the head and neck was performed using the standard protocol during bolus administration of intravenous contrast. Multiplanar CT image reconstructions and MIPs were obtained to evaluate the vascular anatomy. Carotid stenosis measurements (when applicable) are obtained utilizing NASCET criteria, using the distal internal carotid diameter as the denominator. CONTRAST:  30mL OMNIPAQUE IOHEXOL 350 MG/ML SOLN COMPARISON:  Same day CT head.  MR/MRA from 04/18/2017. FINDINGS: CTA NECK FINDINGS Aortic arch: Great vessel origins are patent. Right carotid system: No evidence of dissection, stenosis (50% or greater) or occlusion. Left carotid system: Mixed calcific and noncalcific atherosclerosis at the carotid bifurcation without greater than 50% narrowing. 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 Suspected severe stenosis at the C2 level Skeleton: No acute findings. Other neck: No mass or suspicious adenopathy. Upper chest: No consolidation. Mild ground-glass opacities which may relate to poor inspiration. Review of the MIP images confirms the above findings CTA HEAD FINDINGS Evaluation is limited by venous contrast contamination and motion. Within this limitation: Anterior circulation: Bilateral internal carotid arteries are patent. Suspected moderate stenosis of the distal right M1 MCA. Proximal right M2 MCAs appear patent with  limited evaluation for stenosis. There is focal calcification of a proximal left M2 MCA branch, which correlates with the calcification seen on same day noncontrast head CT. The bilateral A1 and proximal A2 ACAs are patent. Evaluation for stenosis is limited given venous contamination. Posterior circulation: Poor opacification of the intradural right vertebral artery with  severe stenosis of the distal V4 vertebral artery. The left intradural vertebral artery is patent. The basilar artery is patent. Bilateral PCAs are patent proximally without evidence of proximal hemodynamically significant stenosis. More distal PCAs are not well visualized secondary to venous contamination, but appear patent. Venous sinuses: As permitted by contrast timing, patent. Review of the MIP images confirms the above findings IMPRESSION: 1. Focal calcification within a proximal left M2 MCA branch, which correlates with the calcification seen on same day noncontrast head CT. This results in severe stenosis with opacification of the more distal branches. Differential considerations include calcified embolus (paritcularly given calcification was not evident on prior CT from 2019 and given the patient's reported speech difficulty and reported recent dialysis fistula intervention) versus progressive calcific atherosclerosis. 2. Intracranial evaluation is limited by venous contamination and motion with a suspected moderate stenosis of the distal right M1 MCA. 3. 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 Findings may be chronic given poor flow related signal of the right intradural vertebral artery on prior MRA head from 04/18/2017. 4. 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 5. Bilateral carotid bifurcation atherosclerosis without evidence of greater than 50% narrowing. Electronically Signed: By: Margaretha Sheffield MD On: 03/31/2020 09:50   DG Chest Portable 1 View  Result Date: 03/31/2020 CLINICAL DATA:  Stroke-like symptoms EXAM: PORTABLE CHEST 1 VIEW COMPARISON:  03/09/2018 FINDINGS: Cardiomegaly with stable mediastinal contours. Implantable loop recorder. There is no edema, consolidation, effusion, or pneumothorax. Artifact from EKG leads  IMPRESSION: 1. No acute finding or change from 2019. 2. Cardiomegaly. Electronically Signed   By: Monte Fantasia M.D.   On: 03/31/2020 07:02    Procedures .Critical Care Performed by: Merrily Pew, MD Authorized by: Merrily Pew, MD   Critical care provider statement:    Critical care time (minutes):  45   Critical care was necessary to treat or prevent imminent or life-threatening deterioration of the following conditions:  CNS failure or compromise   Critical care was time spent personally by me on the following activities:  Discussions with consultants, evaluation of patient's response to treatment, examination of patient, ordering and performing treatments and interventions, ordering and review of laboratory studies, ordering and review of radiographic studies, pulse oximetry, re-evaluation of patient's condition, obtaining history from patient or surrogate and review of old charts   (including critical care time)  Medications Ordered in ED Medications  aspirin EC tablet 81 mg (81 mg Oral Given 03/31/20 1247)  atorvastatin (LIPITOR) tablet 80 mg (80 mg Oral Given 03/31/20 1856)  ALPRAZolam (XANAX) tablet 0.5 mg (has no administration in time range)  escitalopram (LEXAPRO) tablet 10 mg (10 mg Oral Given 03/31/20 1258)  pantoprazole (PROTONIX) EC tablet 40 mg (40 mg Oral Given 03/31/20 1246)  sevelamer carbonate (RENVELA) tablet 3,200 mg (3,200 mg Oral Given 03/31/20 1854)  clopidogrel (PLAVIX) tablet 75 mg (75 mg Oral Given 03/31/20 1247)   stroke: mapping our early stages of recovery book (has no administration in time range)  senna-docusate (Senokot-S) tablet 1 tablet (has no administration in time  range)  acetaminophen (TYLENOL) tablet 650 mg (650 mg Oral Given 03/31/20 2327)    Or  acetaminophen (TYLENOL) suppository 650 mg ( Rectal See Alternative 03/31/20 2327)  zolpidem (AMBIEN) tablet 5 mg (has no administration in time range)  sorbitol 70 % solution 30 mL (has no  administration in time range)  docusate sodium (ENEMEEZ) enema 283 mg (has no administration in time range)  ondansetron (ZOFRAN) tablet 4 mg (has no administration in time range)    Or  ondansetron (ZOFRAN) injection 4 mg (has no administration in time range)  camphor-menthol (SARNA) lotion 1 application (has no administration in time range)    And  hydrOXYzine (ATARAX/VISTARIL) tablet 25 mg (has no administration in time range)  calcium carbonate (dosed in mg elemental calcium) suspension 500 mg of elemental calcium (has no administration in time range)  feeding supplement (NEPRO CARB STEADY) liquid 237 mL (has no administration in time range)  heparin injection 5,000 Units (5,000 Units Subcutaneous Given 03/31/20 2118)  insulin aspart (novoLOG) injection 0-6 Units (0 Units Subcutaneous Not Given 03/31/20 1848)  ascorbic acid (VITAMIN C) tablet 500 mg (500 mg Oral Given 03/31/20 1246)  zinc sulfate capsule 220 mg (220 mg Oral Given 03/31/20 1246)  guaiFENesin-dextromethorphan (ROBITUSSIN DM) 100-10 MG/5ML syrup 10 mL (has no administration in time range)  chlorpheniramine-HYDROcodone (TUSSIONEX) 10-8 MG/5ML suspension 5 mL (has no administration in time range)  albuterol (VENTOLIN HFA) 108 (90 Base) MCG/ACT inhaler 2 puff (2 puffs Inhalation Not Given 04/01/20 0201)  Chlorhexidine Gluconate Cloth 2 % PADS 6 each (has no administration in time range)  pentafluoroprop-tetrafluoroeth (GEBAUERS) aerosol 1 application (has no administration in time range)  lidocaine (PF) (XYLOCAINE) 1 % injection 5 mL (has no administration in time range)  lidocaine-prilocaine (EMLA) cream 1 application (has no administration in time range)  0.9 %  sodium chloride infusion (has no administration in time range)  0.9 %  sodium chloride infusion (has no administration in time range)  heparin injection 1,000 Units (has no administration in time range)  cinacalcet (SENSIPAR) tablet 180 mg (has no administration in  time range)  calcitRIOL (ROCALTROL) capsule 3.75 mcg (has no administration in time range)  methylPREDNISolone sodium succinate (SOLU-MEDROL) 125 mg/2 mL injection 56.875 mg (56.875 mg Intravenous Given 04/01/20 0325)  remdesivir 200 mg in sodium chloride 0.9% 250 mL IVPB (0 mg Intravenous Stopped 03/31/20 1733)    Followed by  remdesivir 100 mg in sodium chloride 0.9 % 100 mL IVPB (has no administration in time range)  acetaminophen (TYLENOL) tablet 1,000 mg (1,000 mg Oral Given 03/31/20 0747)  iohexol (OMNIPAQUE) 350 MG/ML injection 80 mL (75 mLs Intravenous Contrast Given 03/31/20 0908)  sodium zirconium cyclosilicate (LOKELMA) packet 10 g (10 g Oral Given 03/31/20 2118)  albuterol (PROVENTIL) (2.5 MG/3ML) 0.083% nebulizer solution 10 mg (10 mg Nebulization Given 04/01/20 0328)  insulin aspart (novoLOG) injection 5 Units (5 Units Intravenous Given 04/01/20 0325)    And  dextrose 50 % solution 50 mL (50 mLs Intravenous Given 04/01/20 0325)  sodium zirconium cyclosilicate (LOKELMA) packet 10 g (10 g Oral Given 04/01/20 0326)    ED Course  I have reviewed the triage vital signs and the nursing notes.  Pertinent labs & imaging results that were available during my care of the patient were reviewed by me and considered in my medical decision making (see chart for details).    MDM Rules/Calculators/A&P  Initially seemed to have improving symptoms CT head was initially read as negative by radiologist I discussed with neurology and they stated that would require TIA work-up.  The patient symptoms started to worsen in and around the same time radiology called back and said he saw some calcification in the left MCA that was not there previously and would very likely be related to her symptoms.  Reengage neurology who stated still not at candidate for intervention with such a low NIH stroke scale.  Suggested a CTA and admission to hospitalist.  Discussed with Dr. Lorin Mercy who  will admit to the hospitalist.  Final Clinical Impression(s) / ED Diagnoses Final diagnoses:  Cerebrovascular accident (CVA), unspecified mechanism [facility])    Rx / Deputy Orders ED Discharge Orders    None       Khaleed Holan, Corene Cornea, MD 04/01/20 [phone]

## 2020-04-01 NOTE — Plan of Care (Signed)

## 2020-04-01 NOTE — Evaluation (Addendum)
Physical Therapy Evaluation Patient Details Name: Erin Dean MRN: [id number] DOB: 1965-09-19 Today's Date: 04/01/2020   History of Present Illness  Pt is a 54 y.o. female admitted 03/31/20 with expressive aphasia and L-side numbness; pt also (+) COVID-19. Not a tPA candidate. CT with possible calcification and CTA concerning for high-grade but non-occlusive calcified embolus. MRI with anesthesia 12/24 showed small acute infarct in L frontal cortex; remote L parietal and R frontal cortex infarcts. Code stroke activated due to R-side hemiparesis following sedation; CT negative for new infarct and symptoms improved. PMH includes prior strokes (residual L-side weakness), ESRD (HD TTS), HTN, has loop recorder.    Clinical Impression  Pt presents with an overall decrease in functional mobility secondary to above. PTA, pt independent, lives with granddaughter, daughter assists with driving and medication management; pt has multiple supportive family members nearby available to assist. Today, pt initially with expressive aphasia and difficulty word finding with slurred speech; this significantly improved by end of session, although still some delay noted. LLE <3/5 throughout, otherwise strength WFL. Pt able to initiate transfer and gait training; pt with L knee buckling, stability much improved with RW. Pt would benefit from continued acute PT services to maximize functional mobility and independence prior to d/c with outpatient neuro PT/OT/SLP services.   BP 181/76 once cuff switched to appropriate size SpO2 >/88% on RA, no significant DOE noted    Follow Up Recommendations Outpatient PT;Supervision/Assistance - 24 hour (neuro)    Equipment Recommendations  Rolling walker with 5" wheels;3in1 (PT)    Recommendations for Other Services       Precautions / Restrictions Precautions Precautions: Fall;Other (comment) Precaution Comments: Permissive BP up to 220/110 Restrictions Weight Bearing  Restrictions: No      Mobility  Bed Mobility Overal bed mobility: Modified Independent             General bed mobility comments: HOB elevated    Transfers Overall transfer level: Needs assistance Equipment used: None;Rolling walker (2 wheeled) Transfers: Sit to/from Stand Sit to Stand: Min assist;Min guard         General transfer comment: Initial stand without DME, pt requiring minA for HHA to maintain balance; additional standing trial with RW and min guard, stability improved  Ambulation/Gait Ambulation/Gait assistance: Min assist;Min guard Gait Distance (Feet): 18 Feet Assistive device: 1 person hand held assist;Rolling walker (2 wheeled) Gait Pattern/deviations: Step-to pattern;Step-through pattern;Trunk flexed;Decreased dorsiflexion - left     General Gait Details: Initial steps to recliner without DME, pt reliant on minA for HHA to maintain balance, dragging L foot and difficulty accepting weight without L knee buckling; additional gait trial with RW, still with L foot drop and hip weakness, but able to flex hip/knee enough to take complete step, min guard for balance  Stairs            Wheelchair Mobility    Modified Rankin (Stroke Patients Only) Modified Rankin (Stroke Patients Only) Pre-Morbid Rankin Score: No symptoms Modified Rankin: Moderately severe disability     Balance Overall balance assessment: Needs assistance   Sitting balance-Leahy Scale: Good       Standing balance-Leahy Scale: Poor Standing balance comment: Reliant on UE support with L knee instability                             Pertinent Vitals/Pain Pain Assessment: Faces Faces Pain Scale: Hurts little more Pain Location: head Pain Descriptors / Indicators: Headache Pain  Intervention(s): Monitored during session;Patient requesting pain meds-RN notified    Home Living Family/patient expects to be discharged to:: Private residence Living Arrangements: Other  relatives (granddaughter) Available Help at Discharge: Family;Available 24 hours/day Type of Home: Apartment Home Access: Level entry     Home Layout: One level Home Equipment: None      Prior Function Level of Independence: Independent         Comments: Does not work. Reports she does drive, but daughter typically drives to HD. Daughter assists as needed, including medication management     Hand Dominance        Extremity/Trunk Assessment   Upper Extremity Assessment Upper Extremity Assessment: Overall WFL for tasks assessed    Lower Extremity Assessment Lower Extremity Assessment: LLE deficits/detail LLE Deficits / Details: Hip, knee and ankle <3/5 throughout; reports sensation in tact       Communication   Communication: Expressive difficulties (delayed word finding)  Cognition Arousal/Alertness: Awake/alert Behavior During Therapy: WFL for tasks assessed/performed Overall Cognitive Status: Impaired/Different from baseline Area of Impairment: Attention;Problem solving                   Current Attention Level: Selective         Problem Solving: Requires verbal cues General Comments: Pt's expressive difficulty and apparent cognitive impairment improving significantly by end of session; pt initially with difficulty word finding and slightly slurred speech, improving by end of session but still with some delayed word finding. Initially having trouble remembering details of home set-up (son on phone to help clarify), but answering questions appropriately by end of session. Following multistep commands      General Comments General comments (skin integrity, edema, etc.): Spoke with son and daughter on phone; son verifies that pt has 24/7 support available. Pt reports daughter offerred to let her stay there but pt wants to return home    Exercises     Assessment/Plan    PT Assessment Patient needs continued PT services  PT Problem List Decreased  strength;Decreased activity tolerance;Decreased balance;Decreased mobility;Decreased cognition;Decreased knowledge of use of DME       PT Treatment Interventions DME instruction;Gait training;Stair training;Functional mobility training;Therapeutic activities;Therapeutic exercise;Balance training;Patient/family education;Neuromuscular re-education    PT Goals (Current goals can be found in the Care Plan section)  Acute Rehab PT Goals Patient Stated Goal: "I want to go home" PT Goal Formulation: With patient Time For Goal Achievement: 04/15/20 Potential to Achieve Goals: Good    Frequency Min 4X/week   Barriers to discharge        Co-evaluation               AM-PAC PT "6 Clicks" Mobility  Outcome Measure Help needed turning from your back to your side while in a flat bed without using bedrails?: None Help needed moving from lying on your back to sitting on the side of a flat bed without using bedrails?: None Help needed moving to and from a bed to a chair (including a wheelchair)?: A Little Help needed standing up from a chair using your arms (e.g., wheelchair or bedside chair)?: A Little Help needed to walk in hospital room?: A Little Help needed climbing 3-5 steps with a railing? : A Little 6 Click Score: 20    End of Session   Activity Tolerance: Patient tolerated treatment well Patient left: in chair;with call bell/phone within reach (chair alarm not set because OT present to start evaluation) Nurse Communication: Mobility status PT Visit Diagnosis: Other abnormalities  of gait and mobility (R26.89);Other symptoms and signs involving the nervous system (R29.898)    Time: 6468-0321 PT Time Calculation (min) (ACUTE ONLY): 40 min   Charges:   PT Evaluation $PT Eval Moderate Complexity: 1 Mod PT Treatments $Gait Training: 8-22 mins $Therapeutic Activity: 8-22 mins   Mabeline Caras, PT, DPT Acute Rehabilitation Services  Pager [phone] Office  [place] 04/01/2020, 3:23 PM

## 2020-04-01 NOTE — Consult Note (Signed)
NAME:  Erin Dean, MRN:  [id number], DOB:  28-Aug-1965, LOS: 1 ADMISSION DATE:  03/31/2020, CONSULTATION DATE:  04/01/20 REFERRING MD:  Dr Beverly Gust, CHIEF COMPLAINT:  Acute encephalopathy   Brief History:  See below  History of Present Illness:-Obtained talking to bedside nursing and also Dr. Phillips Climes at the bedside  54 year old obese female with history of end-stage renal disease on dialysis.  Admitted 03/31/2020 for acute right-sided deficits consistent with recurrent stroke.  At the time of transfer to 5 W. progressive unit her deficits had resolved.  On the morning of 04/01/2020 she was ambulating and talking and stroke scale was 0 according to bedside nursing.  She went to MRI under general anesthesia with intubation on the morning of 04/01/2020 receiving induction with fentanyl, lidocaine, dipper Van and succinylcholine along with dexamethasone and Zofran between 8:08 AM and 8:15 AM.  She was extubated around 8:55 AM and found to be combative for which she got another dipper Lucianne Lei.  Subsequently around 9 AM was found to be hypertensive and got labetalol and esmolol and hydralazine.  After which she was transferred to the floor.  In the floor she was initially very somnolent required repeated Narcan at this time pulmonary consultation was called.  At the time of CCM evaluation new right-sided recurrent deficits along with aphasia was noted but patient was protecting her airway and stable.  Blood pressure was around 086 systolic.  The MRI done during intubation showed new acute left frontal stroke.  CTA head/neck at admission showed: CTA head/neck showed calcification in the proximal left M2 MCA branch along with age-indeterminate occlusion of nondominant R vert artery origin and L multifocal moderate stenosis of the L vert artery.  Carotid arteries with < 50% narrowing  Of note: -  Patient is fully vaccinated against COVID and was found to be incidentally Covid positive -Last  hemodialysis 12/21 and then repeated 03/31/2020  Past Medical History:     has a past medical history of ESRD (end stage renal disease) ([place]), Hypertension, Stroke ([place]) (04/2017), and SVT (supraventricular tachycardia) ([place]).   reports that she has never smoked. She has never used smokeless tobacco.  Past Surgical History:  Procedure Laterality Date  . AV FISTULA PLACEMENT Right 08/16/2017   Procedure: CREATION OF RADIOCEPHALIC VERSUS BRACHIOCEPHALIC ARTERIOVENOUS FISTULA RIGHT ARM;  Surgeon: Conrad Riverside, MD;  Location: [place];  Service: Vascular;  Laterality: Right;  . AV FISTULA PLACEMENT Right 02/07/2018   Procedure: Creation Right arm Brachiocephalic Fistula;  Surgeon: Marty Heck, MD;  Location: [facility];  Service: Vascular;  Laterality: Right;  . AV FISTULA PLACEMENT Left 08/15/2018   Procedure: ARTERIOVENOUS (AV) FISTULA CREATION;  Surgeon: Serafina Mitchell, MD;  Location: [place];  Service: Vascular;  Laterality: Left;  . BASCILIC VEIN TRANSPOSITION Right 04/07/2018   Procedure: SECOND STAGE BASILIC VEIN TRANSPOSITION RIGHT ARM;  Surgeon: Marty Heck, MD;  Location: [place];  Service: Vascular;  Laterality: Right;  . CESAREAN SECTION    . ESOPHAGOGASTRODUODENOSCOPY N/A 04/19/2017   Procedure: ESOPHAGOGASTRODUODENOSCOPY (EGD);  Surgeon: Carol Ada, MD;  Location: [place];  Service: Endoscopy;  Laterality: N/A;  . FISTULA SUPERFICIALIZATION Right 10/01/2017   Procedure: FISTULA SUPERFICIALIZATION RIGHT ARM;  Surgeon: Conrad Hickory Hill, MD;  Location: [place];  Service: Vascular;  Laterality: Right;  . FISTULA SUPERFICIALIZATION Left 10/08/2018   Procedure: FISTULA SUPERFICIALIZATION LEFT ARM;  Surgeon: Serafina Mitchell, MD;  Location: [place];  Service: Vascular;  Laterality: Left;  . IR  FLUORO GUIDE CV LINE RIGHT  04/23/2018  . IR US GUIDE VASC ACCESS RIGHT  04/23/2018  . LOOP RECORDER INSERTION N/A 04/22/2017   Procedure: LOOP RECORDER INSERTION;  Surgeon: Constance Haw,  MD;  Location: [facility];  Service: Cardiovascular;  Laterality: N/A;  . TEE WITHOUT CARDIOVERSION N/A 04/19/2017   Procedure: TRANSESOPHAGEAL ECHOCARDIOGRAM (TEE);  Surgeon: Carol Ada, MD;  Location: [place];  Service: Endoscopy;  Laterality: N/A;    Allergies  Allergen Reactions  . Chlorhexidine Gluconate Rash    There is no immunization history for the selected administration types on file for this patient.  Family History  Problem Relation Age of Onset  . Heart attack Maternal Uncle   . Heart attack Maternal Grandfather   . Stroke Paternal Grandmother   . Cancer Father   . Hypertension Sister      Current Facility-Administered Medications:  .   stroke: mapping our early stages of recovery book, , Does not apply, Once, Karmen Bongo, MD .  0.9 %  sodium chloride infusion, 100 mL, Intravenous, PRN, Madelon Lips, MD, New Bag at 04/01/20 [phone] .  0.9 %  sodium chloride infusion, 100 mL, Intravenous, PRN, Madelon Lips, MD .  acetaminophen (TYLENOL) tablet 650 mg, 650 mg, Oral, Q6H PRN, 650 mg at 03/31/20 2327 **OR** acetaminophen (TYLENOL) suppository 650 mg, 650 mg, Rectal, Q6H PRN, Karmen Bongo, MD .  albuterol (VENTOLIN HFA) 108 (90 Base) MCG/ACT inhaler 2 puff, 2 puff, Inhalation, Q6H, Karmen Bongo, MD, 2 puff at 03/31/20 1300 .  ALPRAZolam Duanne Moron) tablet 0.5 mg, 0.5 mg, Oral, Daily PRN, Karmen Bongo, MD .  ascorbic acid (VITAMIN C) tablet 500 mg, 500 mg, Oral, Daily, Karmen Bongo, MD, 500 mg at 03/31/20 1246 .  aspirin EC tablet 81 mg, 81 mg, Oral, Daily, Karmen Bongo, MD, 81 mg at 03/31/20 1247 .  atorvastatin (LIPITOR) tablet 80 mg, 80 mg, Oral, q1800, Karmen Bongo, MD, 80 mg at 03/31/20 1856 .  calcitRIOL (ROCALTROL) capsule 3.75 mcg, 3.75 mcg, Oral, Q M,W,F-HD, Madelon Lips, MD .  calcium carbonate (dosed in mg elemental calcium) suspension 500 mg of elemental calcium, 500 mg of elemental calcium, Oral, Q6H PRN, Karmen Bongo,  MD .  camphor-menthol [facility]) lotion 1 application, 1 application, Topical, Y6R PRN **AND** hydrOXYzine (ATARAX/VISTARIL) tablet 25 mg, 25 mg, Oral, Q8H PRN, Karmen Bongo, MD .  Chlorhexidine Gluconate Cloth 2 % PADS 6 each, 6 each, Topical, Q0600, Madelon Lips, MD .  chlorpheniramine-HYDROcodone (TUSSIONEX) 10-8 MG/5ML suspension 5 mL, 5 mL, Oral, Q12H PRN, Karmen Bongo, MD .  cinacalcet [facility]) tablet 180 mg, 180 mg, Oral, Q M,W,F-HD, Madelon Lips, MD .  clopidogrel (PLAVIX) tablet 75 mg, 75 mg, Oral, Daily, Karmen Bongo, MD, 75 mg at 03/31/20 1247 .  docusate sodium (ENEMEEZ) enema 283 mg, 1 enema, Rectal, PRN, Karmen Bongo, MD .  escitalopram (LEXAPRO) tablet 10 mg, 10 mg, Oral, Daily, Karmen Bongo, MD, 10 mg at 03/31/20 1258 .  feeding supplement (NEPRO CARB STEADY) liquid 237 mL, 237 mL, Oral, TID PRN, Karmen Bongo, MD .  guaiFENesin-dextromethorphan (ROBITUSSIN DM) 100-10 MG/5ML syrup 10 mL, 10 mL, Oral, Q4H PRN, Karmen Bongo, MD .  heparin injection 1,000 Units, 1,000 Units, Dialysis, PRN, Madelon Lips, MD .  heparin injection 5,000 Units, 5,000 Units, Subcutaneous, Q8H, Karmen Bongo, MD, 5,000 Units at 04/01/20 0559 .  insulin aspart (novoLOG) injection 0-6 Units, 0-6 Units, Subcutaneous, TID WC, Karmen Bongo, MD .  lidocaine (PF) (XYLOCAINE) 1 % injection 5 mL,  5 mL, Intradermal, PRN, Madelon Lips, MD .  lidocaine-prilocaine (EMLA) cream 1 application, 1 application, Topical, PRN, Madelon Lips, MD .  naloxone [facility]) 0.4 MG/ML injection, , , ,  .  naloxone HCl (NARCAN) 4 mg in dextrose 5 % 250 mL infusion, 1 mg/hr, Intravenous, Continuous, [place], Silver Huguenin, MD .  ondansetron (ZOFRAN) tablet 4 mg, 4 mg, Oral, Q6H PRN **OR** ondansetron (ZOFRAN) injection 4 mg, 4 mg, Intravenous, Q6H PRN, Karmen Bongo, MD, 4 mg at 04/01/20 0813 .  pantoprazole (PROTONIX) EC tablet 40 mg, 40 mg, Oral, Daily, Karmen Bongo, MD, 40 mg at 03/31/20  1246 .  pentafluoroprop-tetrafluoroeth (GEBAUERS) aerosol 1 application, 1 application, Topical, PRN, Madelon Lips, MD .  senna-docusate (Senokot-S) tablet 1 tablet, 1 tablet, Oral, QHS PRN, Karmen Bongo, MD .  sevelamer carbonate (RENVELA) tablet 3,200 mg, 3,200 mg, Oral, TID WC, Karmen Bongo, MD, 3,200 mg at 03/31/20 1854 .  sorbitol 70 % solution 30 mL, 30 mL, Oral, PRN, Karmen Bongo, MD .  zinc sulfate capsule 220 mg, 220 mg, Oral, Daily, Karmen Bongo, MD, 220 mg at 03/31/20 1246 .  zolpidem (AMBIEN) tablet 5 mg, 5 mg, Oral, QHS PRN, Karmen Bongo, MD   Significant Hospital Events:  03/31/2020 - admit   Consults:  03/31/2020: Renal consult 03/31/2020: Neurology/stroke service consult 04/01/2020: Anesthesia  Procedures:  04/01/2020-04/01/2020: Intubation and ventilation for MRI under anesthesia  Significant Diagnostic Tests:  03/31/2020: CTA 03/31/2020: MRI  Micro Data:  03/31/2020: Covid positive PCR  Antimicrobials:    03/31/2020: Remdesivir  Interim History / Subjective:   04/01/2020: Seen in 5 W. 18.  Appears to be suffering from recurrence of stroke with right-sided deficit and aphasia post blood pressure reduction and MRI  Objective   Blood pressure (!) 163/84, pulse 90, temperature 97.9 F (36.6 C), temperature source Oral, resp. rate 20, weight 112.6 kg, last menstrual period 08/12/2017, SpO2 99 %.        Intake/Output Summary (Last 24 hours) at 04/01/2020 1028 Last data filed at 04/01/2020 1013 Gross per 24 hour  Intake 300 ml  Output 400 ml  Net -100 ml   Filed Weights   03/31/20 1500 04/01/20 0500  Weight: 113.9 kg 112.6 kg    Examination: General: Obese female with a nasal cannula HENT: No elevated JVP but appears aphasic Lungs: No respiratory distress.  Protect airway.  Clear to auscultation bilaterally Cardiovascular: Regular rate and rhythm Abdomen: Obese Extremities: Intact Neuro: Aphasic with right-sided weakness 4  out of 5 and pronator drift GU: Not examined   Resolved Hospital Problem list   X   Assessment & Plan:  ASSESSMENT / PLAN:  PULMONARY  A:  Protecting airway post intubation for MRI    P:   Monitor for intubation   NEUROLOGIC A:   Admitted with R sided weakness per RN  Left frontal stroke on MRI 12/24 Post MRI hypertensive and Rx -> resulted in recurrent R sided weakness and aphasia  Acute enceophalopathy due to aboive  P:   Call code stroke No further need for narcan    VASCULAR A:   Hypertensive intermittently  P:  BP goal per neuro/stroke service '  CARDIAC STRUCTURAL A: Ef 60% in 2019  P: Cycle enzyme and echo per triad  CARDIAC ELECTRICAL A: NSR on ekg 04/01/20  P: Monitor  INFECTIOUS A:   covid-19 incidental despite full vaccination in ESRD patient  P:   Check Covid Igg Rx per triad  RENAL A:  ESRD - 12/21, 12/23  P:  Per renal  ELECTROLYTES A:  Hyperkalemia upon admit - resolved P: Per renal   GASTROINTESTINAL A:   Needs to be npo  P:   npo  HEMATOLOGIC   - HEME A:  Anemia of chronic disease   P:  - PRBC for hgb </= 6.9gm%    - exceptions are   -  if ACS susepcted/confirmed then transfuse for hgb </= 8.0gm%,  or    -  active bleeding with hemodynamic instability, then transfuse regardless of hemoglobin value   At at all times try to transfuse 1 unit prbc as possible with exception of active hemorrhage    ENDOCRINE A:   dm   P:   ssi       Best practice (evaluated daily)  Per [facility]  Goals of Care:  Per [facility]ad  Ccm available if needed   ATTESTATION & SIGNATURE   The patient ALEIRA DEITER is critically ill with multiple organ systems failure and requires high complexity decision making for assessment and support, frequent evaluation and titration of therapies, application of advanced monitoring technologies and extensive interpretation of multiple databases.   Critical Care Time devoted to  patient care services described in this note is  45  Minutes. This time reflects time of care of this signee Dr Brand Males. This critical care time does not reflect procedure time, or teaching time or supervisory time of PA/NP/Med student/Med Resident etc but could involve care discussion time     Dr. Brand Males, M.D., [facility].C.P Pulmonary and Critical Care Medicine Staff Physician [place] [facility] Pager: [phone], If no answer or between  15:00h - 7:00h: call [phone]  04/01/2020 10:29 AM     LABS    PULMONARY Recent Labs  Lab 03/31/20 0622  TCO2 27    CBC Recent Labs  Lab 03/31/20 0616 03/31/20 0622 04/01/20 0115  HGB 11.0* 11.6* 10.4*  HCT 34.2* 34.0* 31.2*  WBC 8.6  --  7.6  PLT 198  --  210    COAGULATION Recent Labs  Lab 03/31/20 0616  INR 1.1    CARDIAC  No results for input(s): TROPONINI in the last 168 hours. No results for input(s): PROBNP in the last 168 hours.   CHEMISTRY Recent Labs  Lab 03/31/20 0616 03/31/20 0622 04/01/20 0115 04/01/20 0418 04/01/20 0718  NA 137 138 135  --   --   K 5.6* 5.5* 6.4* 4.9 4.9  CL 94* 96* 93*  --   --   CO2 25  --  23  --   --   GLUCOSE 94 88 123*  --   --   BUN 68* 69* 77*  --   --   CREATININE 12.72* 13.00* 14.01*  --   --   CALCIUM 7.5*  --  7.8*  --   --   PHOS  --   --  9.8*  --   --    CrCl cannot be calculated (Unknown ideal weight.).   LIVER Recent Labs  Lab 03/31/20 0616 04/01/20 0115  AST 16 15  ALT 12 12  ALKPHOS 66 65  BILITOT 0.6 0.6  PROT 8.3* 8.0  ALBUMIN 3.1* 3.0*  INR 1.1  --      INFECTIOUS Recent Labs  Lab 03/31/20 1309  PROCALCITON 0.27     ENDOCRINE CBG (last 3)  Recent Labs    03/31/20 2047 04/01/20 0742 04/01/20 0943  GLUCAP  126* 145* 167*         IMAGING x48h  - image(s) personally visualized  -   highlighted in bold CT Angio Head W or Wo Contrast  Addendum Date: 03/31/2020    ADDENDUM REPORT: 03/31/2020 10:03 ADDENDUM: Findings discussed with Dr. Lorin Mercy at 10 a.m. via telephone. Electronically Signed   By: Margaretha Sheffield MD   On: 03/31/2020 10:03   Result Date: 03/31/2020 CLINICAL DATA:  Neuro deficit, acute stroke suspected. Left-sided weakness, slurred speech. EXAM: CT ANGIOGRAPHY HEAD AND NECK TECHNIQUE: Multidetector CT imaging of the head and neck was performed using the standard protocol during bolus administration of intravenous contrast. Multiplanar CT image reconstructions and MIPs were obtained to evaluate the vascular anatomy. Carotid stenosis measurements (when applicable) are obtained utilizing NASCET criteria, using the distal internal carotid diameter as the denominator. CONTRAST:  66mL OMNIPAQUE IOHEXOL 350 MG/ML SOLN COMPARISON:  Same day CT head.  MR/MRA from 04/18/2017. FINDINGS: CTA NECK FINDINGS Aortic arch: Great vessel origins are patent. Right carotid system: No evidence of dissection, stenosis (50% or greater) or occlusion. Left carotid system: Mixed calcific and noncalcific atherosclerosis at the carotid bifurcation without greater than 50% narrowing. 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 Suspected severe stenosis at the C2 level Skeleton: No acute findings. Other neck: No mass or suspicious adenopathy. Upper chest: No consolidation. Mild ground-glass opacities which may relate to poor inspiration. Review of the MIP images confirms the above findings CTA HEAD FINDINGS Evaluation is limited by venous contrast contamination and motion. Within this limitation: Anterior circulation: Bilateral internal carotid arteries are patent. Suspected moderate stenosis of the distal right M1 MCA. Proximal right M2 MCAs appear patent with limited evaluation for stenosis. There is focal calcification of a proximal left M2 MCA branch, which correlates with the  calcification seen on same day noncontrast head CT. The bilateral A1 and proximal A2 ACAs are patent. Evaluation for stenosis is limited given venous contamination. Posterior circulation: Poor opacification of the intradural right vertebral artery with severe stenosis of the distal V4 vertebral artery. The left intradural vertebral artery is patent. The basilar artery is patent. Bilateral PCAs are patent proximally without evidence of proximal hemodynamically significant stenosis. More distal PCAs are not well visualized secondary to venous contamination, but appear patent. Venous sinuses: As permitted by contrast timing, patent. Review of the MIP images confirms the above findings IMPRESSION: 1. Focal calcification within a proximal left M2 MCA branch, which correlates with the calcification seen on same day noncontrast head CT. This results in severe stenosis with opacification of the more distal branches. Differential considerations include calcified embolus (paritcularly given calcification was not evident on prior CT from 2019 and given the patient's reported speech difficulty and reported recent dialysis fistula intervention) versus progressive calcific atherosclerosis. 2. Intracranial evaluation is limited by venous contamination and motion with a suspected moderate stenosis of the distal right M1 MCA. 3. 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 Findings may be chronic given poor flow related signal of the right intradural vertebral artery on prior MRA head from 04/18/2017. 4. 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 5. Bilateral carotid bifurcation atherosclerosis without evidence of greater than 50% narrowing. Electronically Signed: By: Margaretha Sheffield MD On: 03/31/2020 09:50   CT HEAD WO CONTRAST  Result Date: 03/31/2020 CLINICAL  DATA:  Neuro deficit, acute, stroke suspected; headache, classic migraine. Additional history provided: 2 prior strokes, left-sided weakness, slurred speech, right frontal headache. EXAM: CT HEAD WITHOUT CONTRAST TECHNIQUE: Contiguous axial images were obtained from the base of the skull through the vertex without intravenous contrast. COMPARISON:  Prior brain MRI 03/11/2018. FINDINGS: Brain: Redemonstrated chronic infarcts within the right frontal lobe (for instance as seen on series 3, image 20) and left parietal lobe. Background cerebral volume is normal. Mild ill-defined hypoattenuation within the cerebral white matter is nonspecific, but compatible with chronic small vessel ischemic disease. Known small chronic infarcts within the bilateral cerebellar hemispheres were better appreciated on the prior MRI of 03/11/2018. There is no acute intracranial hemorrhage. No acute demarcated cortical infarct is identified. No extra-axial fluid collection. No evidence of intracranial mass. No midline shift. Vascular: Atherosclerotic calcifications. There is a focus of calcification in the region of the proximal M2 left middle cerebral artery (in the left sylvian fissure) (series 3, image 11). Skull: Normal. Negative for fracture or focal lesion. Sinuses/Orbits: Visualized orbits show no acute finding. Mild right ethmoid sinus mucosal thickening. ASPECTS [facility] Stroke Program Early CT Score) - Ganglionic level infarction (caudate, lentiform nuclei, internal capsule, insula, M1-M3 cortex): 7 - Supraganglionic infarction (M4-M6 cortex): 3 Total score (0-10 with 10 being normal): 10 (when discounting chronic infarcts). These results were called by telephone at the time of interpretation on 03/31/2020 at 7:45 am to provider [facility] , who verbally acknowledged these results. IMPRESSION: No evidence of acute infarct or acute intracranial hemorrhage. ASPECTS is 10 (when discounting chronic infarcts). Focus of calcification  in the left sylvian fissure in the location of the proximal M2 left middle cerebral artery. This is a new finding as compared to the head CT of 03/10/2018. Differential considerations include calcified embolus versus interval development of calcified plaque at this site. Consider CTA for further evaluation (particularly given the provided history of speech difficulty). Redemonstrated chronic cortically based infarcts within the right frontal and left parietal lobes. Mild cerebral white matter chronic small vessel ischemic disease. Electronically Signed   By: Kellie Simmering DO   On: 03/31/2020 07:58   CT Angio Neck W and/or Wo Contrast  Addendum Date: 03/31/2020   ADDENDUM REPORT: 03/31/2020 10:03 ADDENDUM: Findings discussed with Dr. Lorin Mercy at 10 a.m. via telephone. Electronically Signed   By: Margaretha Sheffield MD   On: 03/31/2020 10:03   Result Date: 03/31/2020 CLINICAL DATA:  Neuro deficit, acute stroke suspected. Left-sided weakness, slurred speech. EXAM: CT ANGIOGRAPHY HEAD AND NECK TECHNIQUE: Multidetector CT imaging of the head and neck was performed using the standard protocol during bolus administration of intravenous contrast. Multiplanar CT image reconstructions and MIPs were obtained to evaluate the vascular anatomy. Carotid stenosis measurements (when applicable) are obtained utilizing NASCET criteria, using the distal internal carotid diameter as the denominator. CONTRAST:  70mL OMNIPAQUE IOHEXOL 350 MG/ML SOLN COMPARISON:  Same day CT head.  MR/MRA from 04/18/2017. FINDINGS: CTA NECK FINDINGS Aortic arch: Great vessel origins are patent. Right carotid system: No evidence of dissection, stenosis (50% or greater) or occlusion. Left carotid system: Mixed calcific and noncalcific atherosclerosis at the carotid bifurcation without greater than 50% narrowing. 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  Suspected severe stenosis at the C2 level Skeleton: No acute findings. Other neck: No mass or suspicious adenopathy. Upper chest: No consolidation. Mild ground-glass opacities which may relate to poor inspiration. Review of  the MIP images confirms the above findings CTA HEAD FINDINGS Evaluation is limited by venous contrast contamination and motion. Within this limitation: Anterior circulation: Bilateral internal carotid arteries are patent. Suspected moderate stenosis of the distal right M1 MCA. Proximal right M2 MCAs appear patent with limited evaluation for stenosis. There is focal calcification of a proximal left M2 MCA branch, which correlates with the calcification seen on same day noncontrast head CT. The bilateral A1 and proximal A2 ACAs are patent. Evaluation for stenosis is limited given venous contamination. Posterior circulation: Poor opacification of the intradural right vertebral artery with severe stenosis of the distal V4 vertebral artery. The left intradural vertebral artery is patent. The basilar artery is patent. Bilateral PCAs are patent proximally without evidence of proximal hemodynamically significant stenosis. More distal PCAs are not well visualized secondary to venous contamination, but appear patent. Venous sinuses: As permitted by contrast timing, patent. Review of the MIP images confirms the above findings IMPRESSION: 1. Focal calcification within a proximal left M2 MCA branch, which correlates with the calcification seen on same day noncontrast head CT. This results in severe stenosis with opacification of the more distal branches. Differential considerations include calcified embolus (paritcularly given calcification was not evident on prior CT from 2019 and given the patient's reported speech difficulty and reported recent dialysis fistula intervention) versus progressive calcific atherosclerosis. 2. Intracranial evaluation is limited by venous contamination and motion with a  suspected moderate stenosis of the distal right M1 MCA. 3. 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 Findings may be chronic given poor flow related signal of the right intradural vertebral artery on prior MRA head from 04/18/2017. 4. 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 5. Bilateral carotid bifurcation atherosclerosis without evidence of greater than 50% narrowing. Electronically Signed: By: Margaretha Sheffield MD On: 03/31/2020 09:50   MR BRAIN WO CONTRAST  Result Date: 04/01/2020 CLINICAL DATA:  Acute neuro deficit with stroke suspected EXAM: MRI HEAD WITHOUT CONTRAST TECHNIQUE: Multiplanar, multiecho pulse sequences of the brain and surrounding structures were obtained without intravenous contrast. COMPARISON:  Head CT and CTA from yesterday.  Brain MRI 03/11/2018 FINDINGS: Brain: Small acute cortical infarct along the lateral left frontal lobe, MCA distribution. Moderate to large remote left parietal infarct which is cortically based. Small remote superior right frontal infarct. No acute hemorrhage, hydrocephalus, or masslike finding. Chronic blood products at sites of prior infarction. Susceptibility along the low left sylvian fissure correlating with calcification on prior CTA. Vascular: Normal flow voids Skull and upper cervical spine: Normal marrow signal Sinuses/Orbits: Negative IMPRESSION: 1. Small acute infarct along the left frontal cortex. 2. 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 Electronically Signed   By: Monte Fantasia M.D.   On: 04/01/2020 09:49   DG Chest Portable 1 View  Result Date: 03/31/2020 CLINICAL DATA:  Stroke-like symptoms EXAM: PORTABLE CHEST 1 VIEW COMPARISON:  03/09/2018 FINDINGS: Cardiomegaly with stable mediastinal contours. Implantable loop recorder. There is no edema, consolidation,  effusion, or pneumothorax. Artifact from EKG leads IMPRESSION: 1. No acute finding or change from 2019. 2. Cardiomegaly. Electronically Signed   By: Monte Fantasia M.D.   On: 03/31/2020 07:02

## 2020-04-01 NOTE — Progress Notes (Signed)
Pharmacy COVID-19 Monoclonal Antibody Screening  Erin Dean was identified as being not hospitalized with symptoms from Covid-19 on admission but an incidental positive PCR has been documented.  The patient may qualify for the use of monoclonal antibodies (mAB) for COVID-19 viral infection to prevent worsening symptoms stemming from Covid-19 infection.  The patient was identified based on a positive COVID-19 PCR and not requiring the use of supplemental oxygen at this time.  This patient meets the FDA criteria for Emergency Use Authorization of casirivimab/imdevimab or bamlanivimab/etesevimab.  Has a (+) direct SARS-CoV-2 viral test result  Is NOT hospitalized due to COVID-19  Is within 10 days of symptom onset  Has at least one of the high risk factor(s) for progression to severe COVID-19 and/or hospitalization as defined in EUA.  Specific high risk criteria : BMI > 25, Chronic Kidney Disease (CKD) and Cardiovascular disease or hypertension  Additionally: The patient has not had a positive COVID-19 PCR in the last 90 days.  The patient is fully vaccinated against COVID-19.  Since the patient is partially or fully vaccinated for COVID-19, has mild symptoms, and meets high risk criteria, the patient is eligible for mAB administration.   This eligibility and indication for treatment was discussed with the patient's physician: Dr. Waldron Labs   Plan: Based on the above discussion, it was decided that the patient will receive one dose of the available COVID-19 mAB combination. Pharmacy will coordinate administration timing with patient's nurse. Recommended infusion monitoring parameters communicated to the nursing team.  Dr. Waldron Labs to consent patient and order  Jimmy Footman, PharmD, BCPS, [facility] Clinical Pharmacist Phone: [phone] 04/01/2020  2:14 PM

## 2020-04-01 NOTE — Code Documentation (Addendum)
Pt is 54 yr old female Covid + admitted to [facility] [address] for stroke like symptoms (left weakness and loss of speech fluency) yesterday. She had a resolution of those symptoms. She was back to baseline NIHSS of 0 at 0745 this morning, then she was sedated for MRI. When pt returned from MRI, she was unable to speak and weak on the right side. A code stroke was called at that time. Vitals and CBG unremarkable.The stroke RN and Neurologist met the pt and RR in CT at 1025. A non-contrast CT was performed. After CT, the pt became markedly better, able to speak and no weakness. She still did have dysarthria and some loss of fluency of speech. At that time Dr Charlsie Merles stated pt no longer meets standard of LVO and plans for  CTA and CTP were cancelled. Pt was returned to [facility] [address] [facility] at 1100. She will need q 30 min vs and neuro checks for 2 hr then return to routine. She was not given TPA as her last known well was actually 03/30/2020. Pt not a candidate for NIR as her exam is LVO negative. Bedside handoff with staff RN complete.

## 2020-04-02 ENCOUNTER — Other Ambulatory Visit (HOSPITAL_COMMUNITY): Payer: Medicare Other

## 2020-04-02 ENCOUNTER — Other Ambulatory Visit: Payer: Self-pay

## 2020-04-02 ENCOUNTER — Inpatient Hospital Stay (HOSPITAL_COMMUNITY): Payer: Medicare Other

## 2020-04-02 DIAGNOSIS — Z992 Dependence on renal dialysis: Secondary | ICD-10-CM

## 2020-04-02 DIAGNOSIS — I6389 Other cerebral infarction: Secondary | ICD-10-CM

## 2020-04-02 DIAGNOSIS — N186 End stage renal disease: Secondary | ICD-10-CM

## 2020-04-02 DIAGNOSIS — I5032 Chronic diastolic (congestive) heart failure: Secondary | ICD-10-CM

## 2020-04-02 DIAGNOSIS — I351 Nonrheumatic aortic (valve) insufficiency: Secondary | ICD-10-CM

## 2020-04-02 LAB — COMPREHENSIVE METABOLIC PANEL
ALT: 11 U/L (ref 0–44)
AST: 15 U/L (ref 15–41)
Albumin: 3 g/dL — ABNORMAL LOW (ref 3.5–5.0)
Alkaline Phosphatase: 58 U/L (ref 38–126)
Anion gap: 21 — ABNORMAL HIGH (ref 5–15)
BUN: 99 mg/dL — ABNORMAL HIGH (ref 6–20)
CO2: 24 mmol/L (ref 22–32)
Calcium: 7.9 mg/dL — ABNORMAL LOW (ref 8.9–10.3)
Chloride: 90 mmol/L — ABNORMAL LOW (ref 98–111)
Creatinine, Ser: 15.82 mg/dL — ABNORMAL HIGH (ref 0.44–1.00)
GFR, Estimated: 2 mL/min — ABNORMAL LOW (ref >60)
Glucose, Bld: 112 mg/dL — ABNORMAL HIGH (ref 70–99)
Potassium: 6.2 mmol/L — ABNORMAL HIGH (ref 3.5–5.1)
Sodium: 135 mmol/L (ref 135–145)
Total Bilirubin: 0.6 mg/dL (ref 0.3–1.2)
Total Protein: 7.5 g/dL (ref 6.5–8.1)

## 2020-04-02 LAB — CBC WITH DIFFERENTIAL/PLATELET
Abs Immature Granulocytes: 0.17 10*3/uL — ABNORMAL HIGH (ref 0.00–0.07)
Basophils Absolute: 0 10*3/uL (ref 0.0–0.1)
Basophils Relative: 0 %
Eosinophils Absolute: 0 10*3/uL (ref 0.0–0.5)
Eosinophils Relative: 0 %
HCT: 28.8 % — ABNORMAL LOW (ref 36.0–46.0)
Hemoglobin: 9.5 g/dL — ABNORMAL LOW (ref 12.0–15.0)
Immature Granulocytes: 1 %
Lymphocytes Relative: 6 %
Lymphs Abs: 1.1 10*3/uL (ref 0.7–4.0)
MCH: 31.1 pg (ref 26.0–34.0)
MCHC: 33 g/dL (ref 30.0–36.0)
MCV: 94.4 fL (ref 80.0–100.0)
Monocytes Absolute: 1.4 10*3/uL — ABNORMAL HIGH (ref 0.1–1.0)
Monocytes Relative: 8 %
Neutro Abs: 14.4 10*3/uL — ABNORMAL HIGH (ref 1.7–7.7)
Neutrophils Relative %: 85 %
Platelets: 215 10*3/uL (ref 150–400)
RBC: 3.05 MIL/uL — ABNORMAL LOW (ref 3.87–5.11)
RDW: 14.6 % (ref 11.5–15.5)
WBC: 17.1 10*3/uL — ABNORMAL HIGH (ref 4.0–10.5)
nRBC: 0 % (ref 0.0–0.2)

## 2020-04-02 LAB — ECHOCARDIOGRAM COMPLETE
AR max vel: 1.65 cm2
AV Area VTI: 1.82 cm2
AV Area mean vel: 1.63 cm2
AV Mean grad: 18 mmHg
AV Peak grad: 33.2 mmHg
Ao pk vel: 2.88 m/s
S' Lateral: 3.5 cm
Single Plane A4C EF: 74.3 %
Weight: 3972.87 oz

## 2020-04-02 LAB — GLUCOSE, CAPILLARY
Glucose-Capillary: 84 mg/dL (ref 70–99)
Glucose-Capillary: 110 mg/dL — ABNORMAL HIGH (ref 70–99)
Glucose-Capillary: 119 mg/dL — ABNORMAL HIGH (ref 70–99)
Glucose-Capillary: 117 mg/dL — ABNORMAL HIGH (ref 70–99)

## 2020-04-02 LAB — RPR: RPR Ser Ql: NONREACTIVE

## 2020-04-02 LAB — C-REACTIVE PROTEIN: CRP: 1.9 mg/dL — ABNORMAL HIGH (ref <1.0)

## 2020-04-02 LAB — PLATELET INHIBITION P2Y12: Platelet Function  P2Y12: 301 [PRU] (ref 182–335)

## 2020-04-02 LAB — POTASSIUM
Potassium: 3.9 mmol/L (ref 3.5–5.1)
Potassium: 3.8 mmol/L (ref 3.5–5.1)

## 2020-04-02 LAB — FERRITIN: Ferritin: 1496 ng/mL — ABNORMAL HIGH (ref 11–307)

## 2020-04-02 MED ORDER — HEPARIN SODIUM (PORCINE) 1000 UNIT/ML DIALYSIS
40.0000 [IU]/kg | INTRAMUSCULAR | Status: DC | PRN
  Filled 2020-04-02: qty 5

## 2020-04-02 MED ORDER — HEPARIN SODIUM (PORCINE) 1000 UNIT/ML DIALYSIS: 1000.0000 [IU] | INTRAMUSCULAR | Status: DC | PRN

## 2020-04-02 MED ORDER — SODIUM CHLORIDE 0.9 % IV SOLN: 100.0000 mL | INTRAVENOUS | Status: DC | PRN

## 2020-04-02 MED ORDER — PENTAFLUOROPROP-TETRAFLUOROETH EX AERO: 1.0000 "application " | INHALATION_SPRAY | CUTANEOUS | Status: DC | PRN

## 2020-04-02 MED ORDER — TICAGRELOR 90 MG PO TABS
90.0000 mg | ORAL_TABLET | Freq: Two times a day (BID) | ORAL | Status: DC
  Administered 2020-04-03: 90 mg via ORAL
  Filled 2020-04-02: qty 1

## 2020-04-02 MED ORDER — LIDOCAINE HCL (PF) 1 % IJ SOLN: 5.0000 mL | INTRAMUSCULAR | Status: DC | PRN

## 2020-04-02 MED ORDER — LIDOCAINE-PRILOCAINE 2.5-2.5 % EX CREA: 1.0000 "application " | TOPICAL_CREAM | CUTANEOUS | Status: DC | PRN

## 2020-04-02 MED ORDER — HEPARIN SODIUM (PORCINE) 1000 UNIT/ML IJ SOLN
INTRAMUSCULAR | Status: AC
  Filled 2020-04-02: qty 5

## 2020-04-02 MED ORDER — ALTEPLASE 2 MG IJ SOLR: 2.0000 mg | Freq: Once | INTRAMUSCULAR | Status: DC | PRN

## 2020-04-02 NOTE — Progress Notes (Signed)
PROGRESS NOTE                                                                             PROGRESS NOTE                                                                                                                                                                                                             Patient Demographics:    Erin Dean, is a 54 y.o. female, DOB - [birth year], TXH:[id number]  Outpatient Primary MD for the patient is Erin Dean    LOS - 2  Admit date - 03/31/2020    Chief Complaint  Patient presents with  . Cerebrovascular Accident       Brief Narrative    Erin Dean is a 54 y.o. 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 She reports h/o LLE weakness with gait disturbance from prior stroke.  She awoke this AM with expressive aphasia and also numbness of L hand and foot.  As well her work-up was significant for COVID-19 positive, she had no symptoms, she is vaccinated but not boosted, he had MRI under anesthesia this morning, which was significant for acute CVA, upon returning to the floor she was significantly altered, where she had a rapid response, she had some improvement with Narcan, noted to have right-sided weakness, which rapid response was called, this has significantly improved currently.    Subjective:    Erin Dean today she denies any complains, no new focal deficits, denies any dyspnea or cough.    Assessment  & Plan :    Principal Problem:   Acute CVA (cerebrovascular accident) [facility]) Active Problems:   Anxiety   Essential hypertension   Obesity   Hyperlipidemia   ESRD on dialysis ([place])   COVID-19 virus infection   Chronic diastolic (congestive) heart failure ([place])  Acute CVA -Patient with left-sided weakness and numbness on presentation, acute right-sided hemiparesis this morning after MRI with sedation. -Management per  neurology.  Weakness secondary to sedation, versus acute left frontal lobe infarct by MRI, weakness improved rapidly with increased alertness. -She is  on aspirin, Plavix and atorvastatin, transition to aspirin and Brilinta x30 days, then Monotherapy(preferably Brilinta if she can afford it.  TOC consulted regarding cost) -2D echo is pending -PT/OT/SLP consulted  Acute encephalopathy/unspecified -Ischemic versus metabolic -Patient had an episode of altered mental status, minimally responsive upon coming back from MRI with anesthesia where she was intubated, she did receive propofol, fentanyl she did receive Narcan x5, with improvement of mental status, for which she was noted after to have some right side weakness, this has totally resolved. -Resolved, mentation back at baseline  COVID 19 infection -Unfortunately, she also appears to have breakthrough COVID infection despite vaccine plus booster -COVID POSITIVE, and confirmation test is also positive -The patient has comorbidities which may increase the risk for ARDS/MODS including:  HTN, DM, obesity,  -Patient is with no hypoxia, no evidence of pneumonia on chest x-ray, no indication for steroids or remdesivir, -received monoclonal antibody 12/24  HTN -Allow permissive HTNfor now -Treat BP only if >220/120, and then with goal of 15% reduction -HoldNorvasc, Coreg, Catapres, Hydralazine, Lasix -Actually blood pressure overall acceptable without her meds.  HLD -Check FLP; lipids in 03/2018: 117/27/77/67 -Continue Lipitor but increase to 80 mg daily for LDL goal <70  ESRD -Patient on chronic TTS HD -Nephrology consulted -Likely will need to switch to Monday Wednesday Friday schedule given she is COVID-19 as an outpatient, renal to arrange.  Chronic diastolic CHF -Echo in 36/6294 with preserved EF and grade 2 diastolic dysfunction -Hold Lasix for now -Volume control with HD  Anxiety and Depression -Continue Xanax,  Lexapro  Obesity -BMI 38.8 (prior weight; repeat pending) -Weight loss should be encouraged -Outpatient PCP/bariatric medicine f/u encouraged     SpO2: 92 % O2 Flow Rate (L/min): 2 L/min  Recent Labs  Lab 03/31/20 0616 03/31/20 0751 03/31/20 0934 03/31/20 1309 04/01/20 0115 04/02/20 0148  WBC 8.6  --   --   --  7.6 17.1*  PLT 198  --   --   --  210 215  CRP  --   --   --  2.2* 2.6* 1.9*  DDIMER  --   --   --  1.16* 1.33*  --   PROCALCITON  --   --   --  0.27  --   --   AST 16  --   --   --  15 15  ALT 12  --   --   --  12 11  ALKPHOS 66  --   --   --  65 58  BILITOT 0.6  --   --   --  0.6 0.6  ALBUMIN 3.1*  --   --   --  3.0* 3.0*  INR 1.1  --   --   --   --   --   SARSCOV2NAA  --  POSITIVE* POSITIVE*  --   --   --        ABG     Component Value Date/Time   TCO2 27 03/31/2020 0622           Condition - Extremely Guarded  Family Communication  : I have discussed with daughter by FaceTime via patient's phone upon patient's request.    Code Status :  Full  Consults  :  Neurology, Renal   Procedures  :  none  Disposition Plan  :  Status is: Inpatient  Remains inpatient appropriate because:Ongoing diagnostic testing needed not appropriate for outpatient work up and IV treatments appropriate due to intensity of illness or inability to take PO   Dispo: The patient is from: Home              Anticipated d/c is to: Home              Anticipated d/c date is: 1 day              Patient currently is not medically stable to d/c.      DVT Prophylaxis  :  [place] heparin  Lab Results  Component Value Date   PLT 215 04/02/2020    Diet :  Diet Order            Diet regular Room service appropriate? Yes; Fluid consistency: Thin  Diet effective now                  Inpatient Medications  Scheduled Meds: .  stroke: mapping our early stages of recovery book   Does not apply Once  . albuterol  2 puff Inhalation Q6H  . vitamin C  500 mg Oral  Daily  . aspirin EC  81 mg Oral Daily  . atorvastatin  80 mg Oral q1800  . calcitRIOL  3.75 mcg Oral Q M,W,F-HD  . Chlorhexidine Gluconate Cloth  6 each Topical Q0600  . cinacalcet  180 mg Oral Q M,W,F-HD  . escitalopram  10 mg Oral Daily  . heparin  5,000 Units Subcutaneous Q8H  . heparin sodium (porcine)      . insulin aspart  0-6 Units Subcutaneous TID WC  . pantoprazole  40 mg Oral Daily  . sevelamer carbonate  3,200 mg Oral TID WC  . [START ON 04/03/2020] ticagrelor  90 mg Oral BID  . zinc sulfate  220 mg Oral Daily   Continuous Infusions: . sodium chloride    . sodium chloride    . sodium chloride    . famotidine (PEPCID) IV    . naLOXone (NARCAN) adult infusion for OVERDOSE     PRN Meds:.sodium chloride, sodium chloride, sodium chloride, acetaminophen **OR** acetaminophen, albuterol, ALPRAZolam, calcium carbonate (dosed in mg elemental calcium), camphor-menthol **AND** hydrOXYzine, chlorpheniramine-HYDROcodone, diphenhydrAMINE, docusate sodium, EPINEPHrine, famotidine (PEPCID) IV, feeding supplement (NEPRO CARB STEADY), guaiFENesin-dextromethorphan, heparin, lidocaine (PF), lidocaine-prilocaine, methylPREDNISolone (SOLU-MEDROL) injection, ondansetron **OR** ondansetron (ZOFRAN) IV, pentafluoroprop-tetrafluoroeth, senna-docusate, sorbitol, zolpidem  Antibiotics  :    Anti-infectives (From admission, onward)   Start     Dose/Rate Route Frequency Ordered Stop   04/01/20 1000  remdesivir 100 mg in sodium chloride 0.9 % 100 mL IVPB  Status:  Discontinued       "Followed by" Linked Group Details   100 mg 200 mL/hr over 30 Minutes Intravenous Daily 03/31/20 1515 04/01/20 0812   03/31/20 1600  remdesivir 200 mg in sodium chloride 0.9% 250 mL IVPB       "Followed by" Linked Group Details   200 mg 580 mL/hr over 30 Minutes Intravenous Once 03/31/20 1515 03/31/20 1733        Raygen Linquist M.D on 04/02/2020 at 12:30 PM    [facility] -  Office  [phone]       Objective:   Vitals:   04/02/20 0642 04/02/20 0824 04/02/20 0911 04/02/20 1137  BP: (!) 167/78 (!) 184/81 (!) 160/77 (!) 156/75  Pulse: 61 64 75 67  Resp: 20 16 19 20   Temp: 97.9 F (  36.6 C) 98.1 F (36.7 C)  98.1 F (36.7 C)  TempSrc: Oral Oral  Axillary  SpO2: 97% 97% 94% 92%  Weight:        Wt Readings from Last 3 Encounters:  04/01/20 112.6 kg  12/01/18 115.7 kg  11/03/18 117.9 kg     Intake/Output Summary (Last 24 hours) at 04/02/2020 1230 Last data filed at 04/02/2020 0830 Gross per 24 hour  Intake 300 ml  Output 2000 ml  Net -1700 ml     Physical Exam   Awake Alert, Oriented X 3, No new F.N deficits, Normal affect Symmetrical Chest wall movement, Good air movement bilaterally, CTAB RRR,No Gallops,Rubs or new Murmurs, No Parasternal Heave +ve B.Sounds, Abd Soft, No tenderness, No rebound - guarding or rigidity. No Cyanosis, Clubbing or edema, No new Rash or bruise      Data Review:    CBC Recent Labs  Lab 03/31/20 0616 03/31/20 0622 04/01/20 0115 04/02/20 0148  WBC 8.6  --  7.6 17.1*  HGB 11.0* 11.6* 10.4* 9.5*  HCT 34.2* 34.0* 31.2* 28.8*  PLT 198  --  210 215  MCV 96.9  --  93.1 94.4  MCH 31.2  --  31.0 31.1  MCHC 32.2  --  33.3 33.0  RDW 14.8  --  14.6 14.6  LYMPHSABS 1.8  --  0.7 1.1  MONOABS 1.5*  --  0.2 1.4*  EOSABS 0.3  --  0.0 0.0  BASOSABS 0.1  --  0.0 0.0    Recent Labs  Lab 03/31/20 0616 03/31/20 0622 03/31/20 1309 04/01/20 0115 04/01/20 0418 04/01/20 1519 04/01/20 1837 04/02/20 0148 04/02/20 0747 04/02/20 0959  NA 137 138  --  135  --   --   --  135  --   --   K 5.6* 5.5*  --  6.4*   < > 6.7* 5.9* 6.2* 3.9 3.8  CL 94* 96*  --  93*  --   --   --  90*  --   --   CO2 25  --   --  23  --   --   --  24  --   --   GLUCOSE 94 88  --  123*  --   --   --  112*  --   --   BUN 68* 69*  --  77*  --   --   --  99*  --   --   CREATININE 12.72* 13.00*  --  14.01*  --   --   --  15.82*  --   --   CALCIUM 7.5*  --   --  7.8*  --    --   --  7.9*  --   --   AST 16  --   --  15  --   --   --  15  --   --   ALT 12  --   --  12  --   --   --  11  --   --   ALKPHOS 66  --   --  65  --   --   --  58  --   --   BILITOT 0.6  --   --  0.6  --   --   --  0.6  --   --   ALBUMIN 3.1*  --   --  3.0*  --   --   --  3.0*  --   --  CRP  --   --  2.2* 2.6*  --   --   --  1.9*  --   --   DDIMER  --   --  1.16* 1.33*  --   --   --   --   --   --   PROCALCITON  --   --  0.27  --   --   --   --   --   --   --   INR 1.1  --   --   --   --   --   --   --   --   --   TSH  --   --  1.974  --   --   --   --   --   --   --   HGBA1C  --   --  5.3  --   --   --   --   --   --   --    < > = values in this interval not displayed.    ------------------------------------------------------------------------------------------------------------------ Recent Labs    04/01/20 0115  CHOL 144  HDL 33*  LDLCALC 97  TRIG 72  CHOLHDL 4.4    Lab Results  Component Value Date   HGBA1C 5.3 03/31/2020   ------------------------------------------------------------------------------------------------------------------ Recent Labs    03/31/20 1309  TSH 1.974    Cardiac Enzymes No results for input(s): CKMB, TROPONINI, MYOGLOBIN in the last 168 hours.  Invalid input(s): CK ------------------------------------------------------------------------------------------------------------------    Component Value Date/Time   BNP 1,262.2 (H) 04/18/2017 0132    Micro Results Recent Results (from the past 240 hour(s))  Resp Panel by RT-PCR (Flu A&B, Covid) Nasopharyngeal Swab     Status: Abnormal   Collection Time: 03/31/20  7:51 AM   Specimen: Nasopharyngeal Swab; Nasopharyngeal(NP) swabs in vial transport medium  Result Value Ref Range Status   SARS Coronavirus 2 by RT PCR POSITIVE (A) NEGATIVE Final    Comment: emailed L. Berdik RN 8:55 03/31/20 ([email or web address]) (NOTE) SARS-CoV-2 target nucleic acids are DETECTED.  The SARS-CoV-2 RNA is generally  detectable in upper respiratory specimens during the acute phase of infection. Positive results are indicative of the presence of the identified virus, but do not rule out bacterial infection or co-infection with other pathogens not detected by the test. Clinical correlation with patient history and other diagnostic information is necessary to determine patient infection status. The expected result is Negative.  Fact Sheet for Patients: EntrepreneurPulse.com.au  Fact Sheet for Healthcare Providers: IncredibleEmployment.be  This test is not yet approved or cleared by the [place] FDA and  has been authorized for detection and/or diagnosis of SARS-CoV-2 by FDA under an Emergency Use Authorization (EUA).  This EUA will remain in effect (meaning this test can be used) for the duration of  the COVID-1 9 declaration under Section 564(b)(1) of the Act, 21 U.S.C. section 360bbb-3(b)(1), unless the authorization is terminated or revoked sooner.     Influenza A by PCR NEGATIVE NEGATIVE Final   Influenza B by PCR NEGATIVE NEGATIVE Final    Comment: (NOTE) The Xpert Xpress SARS-CoV-2/FLU/RSV plus assay is intended as an aid in the diagnosis of influenza from Nasopharyngeal swab specimens and should not be used as a sole basis for treatment. Nasal washings and aspirates are unacceptable for Xpert Xpress SARS-CoV-2/FLU/RSV testing.  Fact Sheet for Patients: EntrepreneurPulse.com.au  Fact Sheet for Healthcare Providers: IncredibleEmployment.be  This test is not yet approved or cleared by the [place]  States FDA and has been authorized for detection and/or diagnosis of SARS-CoV-2 by FDA under an Emergency Use Authorization (EUA). This EUA will remain in effect (meaning this test can be used) for the duration of the COVID-19 declaration under Section 564(b)(1) of the Act, 21 U.S.C. section 360bbb-3(b)(1), unless the  authorization is terminated or revoked.  Performed at [facility], [place] [address], [place], [place] [address]   Resp Panel by RT-PCR (Flu A&B, Covid)     Status: Abnormal   Collection Time: 03/31/20  9:34 AM  Result Value Ref Range Status   SARS Coronavirus 2 by RT PCR POSITIVE (A) NEGATIVE Final    Comment: CRITICAL VALUE NOTED.  VALUE IS CONSISTENT WITH PREVIOUSLY REPORTED AND CALLED VALUE. (NOTE) SARS-CoV-2 target nucleic acids are DETECTED.  The SARS-CoV-2 RNA is generally detectable in upper respiratory specimens during the acute phase of infection. Positive results are indicative of the presence of the identified virus, but do not rule out bacterial infection or co-infection with other pathogens not detected by the test. Clinical correlation with patient history and other diagnostic information is necessary to determine patient infection status. The expected result is Negative.  Fact Sheet for Patients: EntrepreneurPulse.com.au  Fact Sheet for Healthcare Providers: IncredibleEmployment.be  This test is not yet approved or cleared by the [place] FDA and  has been authorized for detection and/or diagnosis of SARS-CoV-2 by FDA under an Emergency Use Authorization (EUA).  This EUA will remain in effect (meaning this test can b e used) for the duration of  the COVID-19 declaration under Section 564(b)(1) of the Act, 21 U.S.C. section 360bbb-3(b)(1), unless the authorization is terminated or revoked sooner.     Influenza A by PCR NEGATIVE NEGATIVE Final   Influenza B by PCR NEGATIVE NEGATIVE Final    Comment: (NOTE) The Xpert Xpress SARS-CoV-2/FLU/RSV plus assay is intended as an aid in the diagnosis of influenza from Nasopharyngeal swab specimens and should not be used as a sole basis for treatment. Nasal washings and aspirates are unacceptable for Xpert Xpress SARS-CoV-2/FLU/RSV testing.  Fact Sheet for  Patients: EntrepreneurPulse.com.au  Fact Sheet for Healthcare Providers: IncredibleEmployment.be  This test is not yet approved or cleared by the [place] FDA and has been authorized for detection and/or diagnosis of SARS-CoV-2 by FDA under an Emergency Use Authorization (EUA). This EUA will remain in effect (meaning this test can be used) for the duration of the COVID-19 declaration under Section 564(b)(1) of the Act, 21 U.S.C. section 360bbb-3(b)(1), unless the authorization is terminated or revoked.  Performed at [facility], [place] [address] [place], [place] [address]     Radiology Reports CT Angio Head W or [place] Contrast  Addendum Date: 03/31/2020   ADDENDUM REPORT: 03/31/2020 10:03 ADDENDUM: Findings discussed with Dr. Lorin Mercy at 10 a.m. via telephone. Electronically Signed   By: Margaretha Sheffield MD   On: 03/31/2020 10:03   Result Date: 03/31/2020 CLINICAL DATA:  Neuro deficit, acute stroke suspected. Left-sided weakness, slurred speech. EXAM: CT ANGIOGRAPHY HEAD AND NECK TECHNIQUE: Multidetector CT imaging of the head and neck was performed using the standard protocol during bolus administration of intravenous contrast. Multiplanar CT image reconstructions and MIPs were obtained to evaluate the vascular anatomy. Carotid stenosis measurements (when applicable) are obtained utilizing NASCET criteria, using the distal internal carotid diameter as the denominator. CONTRAST:  44mL OMNIPAQUE IOHEXOL 350 MG/ML SOLN COMPARISON:  Same day CT head.  MR/MRA from 04/18/2017. FINDINGS: CTA NECK FINDINGS Aortic arch: [place]  vessel origins are patent. Right carotid system: No evidence of dissection, stenosis (50% or greater) or occlusion. Left carotid system: Mixed calcific and noncalcific atherosclerosis at the carotid bifurcation without greater than 50% narrowing. 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 Suspected severe stenosis at the C2 level Skeleton: No acute findings. Other neck: No mass or suspicious adenopathy. Upper chest: No consolidation. Mild ground-glass opacities which may relate to poor inspiration. Review of the MIP images confirms the above findings CTA HEAD FINDINGS Evaluation is limited by venous contrast contamination and motion. Within this limitation: Anterior circulation: Bilateral internal carotid arteries are patent. Suspected moderate stenosis of the distal right M1 MCA. Proximal right M2 MCAs appear patent with limited evaluation for stenosis. There is focal calcification of a proximal left M2 MCA branch, which correlates with the calcification seen on same day noncontrast head CT. The bilateral A1 and proximal A2 ACAs are patent. Evaluation for stenosis is limited given venous contamination. Posterior circulation: Poor opacification of the intradural right vertebral artery with severe stenosis of the distal V4 vertebral artery. The left intradural vertebral artery is patent. The basilar artery is patent. Bilateral PCAs are patent proximally without evidence of proximal hemodynamically significant stenosis. More distal PCAs are not well visualized secondary to venous contamination, but appear patent. Venous sinuses: As permitted by contrast timing, patent. Review of the MIP images confirms the above findings IMPRESSION: 1. Focal calcification within a proximal left M2 MCA branch, which correlates with the calcification seen on same day noncontrast head CT. This results in severe stenosis with opacification of the more distal branches. Differential considerations include calcified embolus (paritcularly given calcification was not evident on prior CT from 2019 and given the patient's reported speech difficulty and reported recent dialysis fistula intervention) versus progressive calcific atherosclerosis. 2. Intracranial evaluation is  limited by venous contamination and motion with a suspected moderate stenosis of the distal right M1 MCA. 3. 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 Findings may be chronic given poor flow related signal of the right intradural vertebral artery on prior MRA head from 04/18/2017. 4. 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 5. Bilateral carotid bifurcation atherosclerosis without evidence of greater than 50% narrowing. Electronically Signed: By: Margaretha Sheffield MD On: 03/31/2020 09:50   CT HEAD WO CONTRAST  Result Date: 03/31/2020 CLINICAL DATA:  Neuro deficit, acute, stroke suspected; headache, classic migraine. Additional history provided: 2 prior strokes, left-sided weakness, slurred speech, right frontal headache. EXAM: CT HEAD WITHOUT CONTRAST TECHNIQUE: Contiguous axial images were obtained from the base of the skull through the vertex without intravenous contrast. COMPARISON:  Prior brain MRI 03/11/2018. FINDINGS: Brain: Redemonstrated chronic infarcts within the right frontal lobe (for instance as seen on series 3, image 20) and left parietal lobe. Background cerebral volume is normal. Mild ill-defined hypoattenuation within the cerebral white matter is nonspecific, but compatible with chronic small vessel ischemic disease. Known small chronic infarcts within the bilateral cerebellar hemispheres were better appreciated on the prior MRI of 03/11/2018. There is no acute intracranial hemorrhage. No acute demarcated cortical infarct is identified. No extra-axial fluid collection. No evidence of intracranial mass. No midline shift. Vascular: Atherosclerotic calcifications. There is a focus of calcification in the region of the proximal M2 left middle cerebral artery (in the left sylvian fissure) (series 3,  image 11). Skull: Normal. Negative  for fracture or focal lesion. Sinuses/Orbits: Visualized orbits show no acute finding. Mild right ethmoid sinus mucosal thickening. ASPECTS [facility] Stroke Program Early CT Score) - Ganglionic level infarction (caudate, lentiform nuclei, internal capsule, insula, M1-M3 cortex): 7 - Supraganglionic infarction (M4-M6 cortex): 3 Total score (0-10 with 10 being normal): 10 (when discounting chronic infarcts). These results were called by telephone at the time of interpretation on 03/31/2020 at 7:45 am to provider [facility] , who verbally acknowledged these results. IMPRESSION: No evidence of acute infarct or acute intracranial hemorrhage. ASPECTS is 10 (when discounting chronic infarcts). Focus of calcification in the left sylvian fissure in the location of the proximal M2 left middle cerebral artery. This is a new finding as compared to the head CT of 03/10/2018. Differential considerations include calcified embolus versus interval development of calcified plaque at this site. Consider CTA for further evaluation (particularly given the provided history of speech difficulty). Redemonstrated chronic cortically based infarcts within the right frontal and left parietal lobes. Mild cerebral white matter chronic small vessel ischemic disease. Electronically Signed   By: Kellie Simmering DO   On: 03/31/2020 07:58   CT Angio Neck W and/or Wo Contrast  Addendum Date: 03/31/2020   ADDENDUM REPORT: 03/31/2020 10:03 ADDENDUM: Findings discussed with Dr. Lorin Mercy at 10 a.m. via telephone. Electronically Signed   By: Margaretha Sheffield MD   On: 03/31/2020 10:03   Result Date: 03/31/2020 CLINICAL DATA:  Neuro deficit, acute stroke suspected. Left-sided weakness, slurred speech. EXAM: CT ANGIOGRAPHY HEAD AND NECK TECHNIQUE: Multidetector CT imaging of the head and neck was performed using the standard protocol during bolus administration of intravenous contrast. Multiplanar CT image reconstructions and MIPs were obtained to  evaluate the vascular anatomy. Carotid stenosis measurements (when applicable) are obtained utilizing NASCET criteria, using the distal internal carotid diameter as the denominator. CONTRAST:  67mL OMNIPAQUE IOHEXOL 350 MG/ML SOLN COMPARISON:  Same day CT head.  MR/MRA from 04/18/2017. FINDINGS: CTA NECK FINDINGS Aortic arch: [place] vessel origins are patent. Right carotid system: No evidence of dissection, stenosis (50% or greater) or occlusion. Left carotid system: Mixed calcific and noncalcific atherosclerosis at the carotid bifurcation without greater than 50% narrowing. 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 Suspected severe stenosis at the C2 level Skeleton: No acute findings. Other neck: No mass or suspicious adenopathy. Upper chest: No consolidation. Mild ground-glass opacities which may relate to poor inspiration. Review of the MIP images confirms the above findings CTA HEAD FINDINGS Evaluation is limited by venous contrast contamination and motion. Within this limitation: Anterior circulation: Bilateral internal carotid arteries are patent. Suspected moderate stenosis of the distal right M1 MCA. Proximal right M2 MCAs appear patent with limited evaluation for stenosis. There is focal calcification of a proximal left M2 MCA branch, which correlates with the calcification seen on same day noncontrast head CT. The bilateral A1 and proximal A2 ACAs are patent. Evaluation for stenosis is limited given venous contamination. Posterior circulation: Poor opacification of the intradural right vertebral artery with severe stenosis of the distal V4 vertebral artery. The left intradural vertebral artery is patent. The basilar artery is patent. Bilateral PCAs are patent proximally without evidence of proximal hemodynamically significant stenosis. More distal PCAs are not well visualized secondary to venous contamination,  but appear patent. Venous sinuses: As permitted by contrast timing, patent. Review of the MIP images confirms the above findings IMPRESSION: 1. Focal  calcification within a proximal left M2 MCA branch, which correlates with the calcification seen on same day noncontrast head CT. This results in severe stenosis with opacification of the more distal branches. Differential considerations include calcified embolus (paritcularly given calcification was not evident on prior CT from 2019 and given the patient's reported speech difficulty and reported recent dialysis fistula intervention) versus progressive calcific atherosclerosis. 2. Intracranial evaluation is limited by venous contamination and motion with a suspected moderate stenosis of the distal right M1 MCA. 3. 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 Findings may be chronic given poor flow related signal of the right intradural vertebral artery on prior MRA head from 04/18/2017. 4. 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 5. Bilateral carotid bifurcation atherosclerosis without evidence of greater than 50% narrowing. Electronically Signed: By: Margaretha Sheffield MD On: 03/31/2020 09:50   MR BRAIN WO CONTRAST  Result Date: 04/01/2020 CLINICAL DATA:  Acute neuro deficit with stroke suspected EXAM: MRI HEAD WITHOUT CONTRAST TECHNIQUE: Multiplanar, multiecho pulse sequences of the brain and surrounding structures were obtained without intravenous contrast. COMPARISON:  Head CT and CTA from yesterday.  Brain MRI 03/11/2018 FINDINGS: Brain: Small acute cortical infarct along the lateral left frontal lobe, MCA distribution. Moderate to large remote left parietal infarct which is cortically based. Small remote superior right frontal infarct. No acute hemorrhage, hydrocephalus, or masslike finding.  Chronic blood products at sites of prior infarction. Susceptibility along the low left sylvian fissure correlating with calcification on prior CTA. Vascular: Normal flow voids Skull and upper cervical spine: Normal marrow signal Sinuses/Orbits: Negative IMPRESSION: 1. Small acute infarct along the left frontal cortex. 2. 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 Electronically Signed   By: Monte Fantasia M.D.   On: 04/01/2020 09:49   DG Chest Portable 1 View  Result Date: 03/31/2020 CLINICAL DATA:  Stroke-like symptoms EXAM: PORTABLE CHEST 1 VIEW COMPARISON:  03/09/2018 FINDINGS: Cardiomegaly with stable mediastinal contours. Implantable loop recorder. There is no edema, consolidation, effusion, or pneumothorax. Artifact from EKG leads IMPRESSION: 1. No acute finding or change from 2019. 2. Cardiomegaly. Electronically Signed   By: Monte Fantasia M.D.   On: 03/31/2020 07:02   ECHOCARDIOGRAM COMPLETE  Result Date: 04/02/2020    ECHOCARDIOGRAM REPORT   Patient Name:   SENECA GADBOIS Date of Exam: 04/02/2020 Medical Rec #:  [id number]        Height:       68.0 in Accession #:    [id number]       Weight:       248.3 lb Date of Birth:  [birth year]        BSA:          2.241 m Patient Age:    73 years         BP:           160/77 mmHg Patient Gender: F                HR:           71 bpm. Exam Location:  Inpatient Procedure: 2D Echo, Cardiac Doppler and Color Doppler Indications:    Stroke  History:        Patient has prior history of Echocardiogram examinations, most                 recent 03/10/2018. Covid 19  positive.  Sonographer:    Merrie Roof RDCS Referring Phys: [place]  1. When compared to the prior study from 03/10/2018 aortc valve is now thicker with a possible mobile echodensity attached to the left coronary cusp. A TEE is recommended for further evaluation.  2. Left ventricular ejection fraction, by estimation, is 65 to 70%. The left ventricle has normal  function. The left ventricle has no regional wall motion abnormalities. Left ventricular diastolic parameters are consistent with Grade II diastolic dysfunction (pseudonormalization). Elevated left atrial pressure.  3. Right ventricular systolic function is normal. The right ventricular size is normal. There is mildly elevated pulmonary artery systolic pressure.  4. Left atrial size was mildly dilated.  5. The mitral valve is normal in structure. No evidence of mitral valve regurgitation. No evidence of mitral stenosis.  6. The aortic valve is normal in structure. There is mild calcification of the aortic valve. There is severe thickening of the aortic valve. Aortic valve regurgitation is mild. No aortic stenosis is present.  7. The inferior vena cava is normal in size with greater than 50% respiratory variability, suggesting right atrial pressure of 3 mmHg. FINDINGS  Left Ventricle: Left ventricular ejection fraction, by estimation, is 65 to 70%. The left ventricle has normal function. The left ventricle has no regional wall motion abnormalities. The left ventricular internal cavity size was normal in size. There is  no left ventricular hypertrophy. Left ventricular diastolic parameters are consistent with Grade II diastolic dysfunction (pseudonormalization). Elevated left atrial pressure. Right Ventricle: The right ventricular size is normal. No increase in right ventricular wall thickness. Right ventricular systolic function is normal. There is mildly elevated pulmonary artery systolic pressure. The tricuspid regurgitant velocity is 2.95  m/s, and with an assumed right atrial pressure of 3 mmHg, the estimated right ventricular systolic pressure is 69.6 mmHg. Left Atrium: Left atrial size was mildly dilated. Right Atrium: Right atrial size was normal in size. Pericardium: There is no evidence of pericardial effusion. Mitral Valve: The mitral valve is normal in structure. No evidence of mitral valve regurgitation.  No evidence of mitral valve stenosis. Tricuspid Valve: The tricuspid valve is normal in structure. Tricuspid valve regurgitation is not demonstrated. No evidence of tricuspid stenosis. Aortic Valve: The aortic valve is normal in structure. There is mild calcification of the aortic valve. There is severe thickening of the aortic valve. Aortic valve regurgitation is mild. No aortic stenosis is present. Aortic valve mean gradient measures  18.0 mmHg. Aortic valve peak gradient measures 33.2 mmHg. Aortic valve area, by VTI measures 1.82 cm. Pulmonic Valve: The pulmonic valve was normal in structure. Pulmonic valve regurgitation is trivial. No evidence of pulmonic stenosis. Aorta: The aortic root is normal in size and structure. Venous: The inferior vena cava is normal in size with greater than 50% respiratory variability, suggesting right atrial pressure of 3 mmHg. IAS/Shunts: No atrial level shunt detected by color flow Doppler.  LEFT VENTRICLE PLAX 2D LVIDd:         5.50 cm LVIDs:         3.50 cm LV PW:         1.20 cm LV IVS:        1.10 cm LVOT diam:     2.00 cm LV SV:         106 LV SV Index:   47 LVOT Area:     3.14 cm  LV Volumes (MOD) LV vol d, MOD A4C: 143.0 ml LV  vol s, MOD A4C: 36.8 ml LV SV MOD A4C:     143.0 ml RIGHT VENTRICLE RV Basal diam:  3.70 cm LEFT ATRIUM             Index       RIGHT ATRIUM           Index LA diam:        5.60 cm 2.50 cm/m  RA Area:     19.80 cm LA Vol (A2C):   52.0 ml 23.21 ml/m RA Volume:   56.50 ml  25.22 ml/m LA Vol (A4C):   77.6 ml 34.63 ml/m LA Biplane Vol: 63.9 ml 28.52 ml/m  AORTIC VALVE AV Area (Vmax):    1.65 cm AV Area (Vmean):   1.63 cm AV Area (VTI):     1.82 cm AV Vmax:           288.00 cm/s AV Vmean:          201.000 cm/s AV VTI:            0.582 m AV Peak Grad:      33.2 mmHg AV Mean Grad:      18.0 mmHg LVOT Vmax:         151.00 cm/s LVOT Vmean:        104.000 cm/s LVOT VTI:          0.337 m LVOT/AV VTI ratio: 0.58  AORTA Ao Root diam: 2.70 cm Ao Asc  diam:  3.80 cm TRICUSPID VALVE TR Peak grad:   34.8 mmHg TR Vmax:        295.00 cm/s  SHUNTS Systemic VTI:  0.34 m Systemic Diam: 2.00 cm Ena Dawley MD Electronically signed by Ena Dawley MD Signature Date/Time: 04/02/2020/12:29:17 PM    Final    CT HEAD CODE STROKE WO CONTRAST  Result Date: 04/01/2020 CLINICAL DATA:  Code stroke. Right-sided weakness and inability to speak EXAM: CT HEAD WITHOUT CONTRAST TECHNIQUE: Contiguous axial images were obtained from the base of the skull through the vertex without intravenous contrast. COMPARISON:  Brain MRI from earlier today FINDINGS: Brain: Small acute left frontal cortex infarct by preceding MRI. Remote left parietal and superior right frontal infarcts. No hemorrhage, hydrocephalus, or collection. Vascular: High-density vessels from recent CTA. Presumed calcified embolism is again seen at the low left sylvian fissure. Skull: Negative Sinuses/Orbits: Negative ASPECTS ([place] Stroke Program Early CT Score) Not scored in this setting IMPRESSION: 1. Small acute infarct in the left frontal lobe by brain MRI 2 hours prior. No acute hemorrhage. 2. Remote left parietal and right frontal infarcts. 3. Presumed calcified embolism in the low left sylvian fissure. Electronically Signed   By: Monte Fantasia M.D.   On: 04/01/2020 10:57

## 2020-04-02 NOTE — Progress Notes (Signed)
Pt picked up via transport at 0223 on 2L of oxygen for dialysis.

## 2020-04-02 NOTE — Care Management (Signed)
Order to check for Brilinta, unable to call today or Sunday due to insurance closure., We do have Cards available to provide paitent depending on when discharging.

## 2020-04-02 NOTE — Progress Notes (Signed)
Patient ID: PARUL PORCELLI, female   DOB: 09-Feb-1966, 54 y.o.   MRN: [id number] [facility] Progress Note   Assessment/ Plan:   1.  CVA symptoms with left-sided weakness with loss of speech fluency: Negative imaging for acute CVA and continues to have neurological checks. 2.  Covid infection: She has been vaccinated (but has not received a booster).  Does not appear to be symptomatic from a respiratory standpoint and is currently getting remdesivir.  Plans in place for possible discharge home tomorrow if remaining stable. 3. ESRD: She is usually on a TTS dialysis schedule and underwent hemodialysis late yesterday night in Covid isolation unit.  We will need to coordinate with the outpatient dialysis unit so that she can be accommodated in their Covid shift. 4. Anemia: Without overt blood loss and currently hemoglobin and hematocrit within acceptable range. 5. CKD-MBD: Significantly elevated phosphorus level noted, continue Sensipar and calcitriol for PTH control and phosphorus binders. 6. Nutrition: Tolerating current diet without any limitations/aspiration. 7. Hypertension: Blood pressure intermittently elevated, continue to monitor on current antihypertensive therapy/hemodialysis.  Subjective:   Without acute events overnight, she denies any chest pain or shortness of breath and reports that right-sided weakness is improving.   Objective:   BP (!) 160/77   Pulse 75   Temp 98.1 F (36.7 C) (Oral)   Resp 19   Wt 112.6 kg   LMP 08/12/2017   SpO2 94%   BMI 37.75 kg/m   Physical Exam: Gen: Comfortably in bed, awakens easily and engaging in conversation. CVS: Pulse regular rhythm, normal rate, S1 and S2 normal Resp: Anteriorly clear to auscultation, no distinct rales or rhonchi Abd: Soft, obese, nontender Ext: No lower extremity edema.  Right brachiocephalic fistula with intact dressings.  Labs: BMET Recent Labs  Lab 03/31/20 0616 03/31/20 0622 04/01/20 0115  04/01/20 0418 04/01/20 0718 04/01/20 1120 04/01/20 1519 04/01/20 1837 04/02/20 0148 04/02/20 0747  NA 137 138 135  --   --   --   --   --  135  --   K 5.6* 5.5* 6.4* 4.9 4.9 5.4* 6.7* 5.9* 6.2* 3.9  CL 94* 96* 93*  --   --   --   --   --  90*  --   CO2 25  --  23  --   --   --   --   --  24  --   GLUCOSE 94 88 123*  --   --   --   --   --  112*  --   BUN 68* 69* 77*  --   --   --   --   --  99*  --   CREATININE 12.72* 13.00* 14.01*  --   --   --   --   --  15.82*  --   CALCIUM 7.5*  --  7.8*  --   --   --   --   --  7.9*  --   PHOS  --   --  9.8*  --   --   --   --   --   --   --    CBC Recent Labs  Lab 03/31/20 0616 03/31/20 0622 04/01/20 0115 04/02/20 0148  WBC 8.6  --  7.6 17.1*  NEUTROABS 4.8  --  6.7 14.4*  HGB 11.0* 11.6* 10.4* 9.5*  HCT 34.2* 34.0* 31.2* 28.8*  MCV 96.9  --  93.1 94.4  PLT 198  --  210 215     Medications:    .  stroke: mapping our early stages of recovery book   Does not apply Once  . albuterol  2 puff Inhalation Q6H  . vitamin C  500 mg Oral Daily  . aspirin EC  81 mg Oral Daily  . atorvastatin  80 mg Oral q1800  . calcitRIOL  3.75 mcg Oral Q M,W,F-HD  . Chlorhexidine Gluconate Cloth  6 each Topical Q0600  . cinacalcet  180 mg Oral Q M,W,F-HD  . clopidogrel  75 mg Oral Daily  . escitalopram  10 mg Oral Daily  . heparin  5,000 Units Subcutaneous Q8H  . heparin sodium (porcine)      . insulin aspart  0-6 Units Subcutaneous TID WC  . pantoprazole  40 mg Oral Daily  . sevelamer carbonate  3,200 mg Oral TID WC  . zinc sulfate  220 mg Oral Daily   Elmarie Shiley, MD 04/02/2020, 11:04 AM

## 2020-04-02 NOTE — Progress Notes (Signed)
STROKE TEAM PROGRESS NOTE   HISTORY OF PRESENT ILLNESS (per record) Erin Dean is a 54 y.o. female with PMH of strokes x 2. She underwent a thorough workup. I reviewed this workup by thorough chart review. She has elevated homocysteine, and MTHFR has not yet been checked (folate is normal). Echocardiogram showed severely dilated left atrium. LINQ interrogations have been free of afib or other malignant dysrrhythmia. An extensive hypercoagulability and autoimmune workup has been done. ESR was elevated, but CRP was OK. She did not have clear evidence of vasculitis on prior imaging. UDS has not been assessed, nor do I see SPEP/IFE or syphilis serology. She has ESRD and had an infiltrated graft in the left upper arm recently treated. She's had some chest pain during dialysis intermittently. Otherwise, no obvious recent changes in her status. She went to bed feeling generally OK and when she called her daughter today a speech alteration was noted. In addition to the LEFT sided numbness (F/A/L) a stuttering/halting speech pattern was noted. She was brought in for evaluation. Stroke alert was not activated due to being a wake-up stroke with very low NIHSS.  LKW: last night 8-9 pm tpa given?: no, not eligible due to OOW. Premorbid modified rankin scale: 1 ICH Score: n/a  Erin Dean is a 54 y.o. female with a history significant for ESRD on HD Tu, Th, Sat, strokes x 2, obesity, current COVID+, HTN, and a left upper arm fistula repair for an infiltrated graft on 03/30/2020 who presented to [facility] 12/23 for stroke-like symptoms. She woke up 12/23 with expressive aphasia, stuttering/halting speech, and intermittent numbness of the LEFT face, arm, leg, right facial droop. She also has been unable to complete her HD sessions lately related to intermittent chest pain. She initially was not activated as a code stroke due to wake-up symptoms and a very low NIHSS/low suspicion for LVO. At baseline, she has residual  LLE weakness with gait disturbance from previous CVA and has had extensive stroke work-ups in the past.   12/23: Stroke work-up found Ms. Nuccio has elevated homocysteine levels but with normal folate levels (no MTHFR checked) and her echocardiogram revealed a severely dilated left atrium. Loop recorder without evidence of malignant dysrhythmia. An extensive hypercoagulability and autoimmune workup has been done. ESR was elevated, but CRP was OK. No clear evidence of vasculitis has been identified on previous imaging.   12/24:  -09:49 MRI done under sedation; shows small acute infarct along the LEFT frontal cortex with remote left parietal and right frontal cortex infarcts (inconsistent with presenting left-sided symptoms)  -10:25 Stroke alert was activated due to patient right-sided hemipareis following sedation for MRI.   LKW: 12/22 8-9 pm (fluctuating symptoms since hospitalization and reason for hospitalization) tpa given?: No, not eligible with symptoms starting and fluctuating since 12/23 (LKW 12/22 8-9 pm) IR Thrombectomy? No, recent images with no LVO, during CT head non-contrast, all symptoms resolved and unable to obtain CTA head/neck and CTP due to patient severe clausterphobia.  Modified Rankin Scale: 1-No significant post stroke disability and can perform usual duties with stroke symptoms ICH Score: n/a NIHSS: initial assessment on arrival to CT at 10:28 TOTAL: 15 NIHSS after non-contrast CT 10:35 TOTAL: 3  INTERVAL HISTORY I have personally reviewed history of presenting illness with the patient, electronic medical records and imaging films in PACS.  She had a fistulogram on Wednesday and developed a headache after that and when she woke up the next day had expressive aphasia.  Seems  to have improved.  She was asked to be claustrophobic and underwent MRI under sedation and following this yesterday had some transient right-sided weakness but neurological exam was quite  inconsistent.  MRI scan does confirm a small left MCA frontal branch infarct and CT angiogram had shown a calcific embolus MCA branch corresponding to the infarcted area.  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  She has received 3 doses of COVID vaccine and yet has been found to be is incidentally positive for Covid but has no active symptoms    OBJECTIVE Vitals:   04/02/20 0451 04/02/20 0506 04/02/20 0521 04/02/20 0536  BP: (!) 159/74 (!) 149/70 (!) 145/68 (!) 144/65  Pulse: (!) 56 (!) 58 (!) 55 60  Resp: _0 Temp:      TempSrc:      SpO2: 99% 99% 98% 98%  Weight:        CBC:  Recent Labs  Lab 04/01/20 0115 04/02/20 0148  WBC 7.6 17.1*  NEUTROABS 6.7 14.4*  HGB 10.4* 9.5*  HCT 31.2* 28.8*  MCV 93.1 94.4  PLT 210 245    Basic Metabolic Panel:  Recent Labs  Lab 04/01/20 0115 04/01/20 0418 04/01/20 1837 04/02/20 0148  NA 135  --   --  135  K 6.4*   < > 5.9* 6.2*  CL 93*  --   --  90*  CO2 23  --   --  24  GLUCOSE 123*  --   --  112*  BUN 77*  --   --  99*  CREATININE 14.01*  --   --  15.82*  CALCIUM 7.8*  --   --  7.9*  PHOS 9.8*  --   --   --    < > = values in this interval not displayed.    Lipid Panel:     Component Value Date/Time   CHOL 144 04/01/2020 0115   TRIG 72 04/01/2020 0115   HDL 33 (L) 04/01/2020 0115   CHOLHDL 4.4 04/01/2020 0115   VLDL 14 04/01/2020 0115   LDLCALC 97 04/01/2020 0115   HgbA1c:  Lab Results  Component Value Date   HGBA1C 5.3 03/31/2020   Urine Drug Screen: No results found for: LABOPIA, COCAINSCRNUR, LABBENZ, AMPHETMU, THCU, LABBARB  Alcohol Level     Component Value Date/Time   ETH <10 03/09/2018 1532    IMAGING  CT Angio Head W or Wo Contrast CT Angio Neck W and/or Wo Contrast 03/31/2020   IMPRESSION:  1. Focal calcification within a proximal left M2 MCA branch, which correlates with the calcification seen on same day noncontrast head CT.  This results in severe stenosis with opacification of the more distal branches. Differential considerations include calcified embolus (paritcularly given calcification was not evident on prior CT from 2019 and given the patient's reported speech difficulty and reported recent dialysis fistula intervention) versus progressive calcific atherosclerosis.  2. Intracranial evaluation is limited by venous contamination and motion with a suspected moderate stenosis of the distal right M1 MCA.  3. 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 Findings may be chronic given poor flow related signal of the right intradural vertebral artery on prior MRA head from 04/18/2017.  4. 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  5. Bilateral carotid bifurcation atherosclerosis without evidence  of greater than 50% narrowing.   CT HEAD WO CONTRAST 03/31/2020 IMPRESSION:  No evidence of acute infarct or acute intracranial hemorrhage. ASPECTS is 10 (when discounting chronic infarcts).  Focus of calcification in the left sylvian fissure in the location of the proximal M2 left middle cerebral artery. This is a new finding as compared to the head CT of 03/10/2018.  Differential considerations include calcified embolus versus interval development of calcified plaque at this site.  Consider CTA for further evaluation (particularly given the provided history of speech difficulty).  Redemonstrated chronic cortically based infarcts within the right frontal and left parietal lobes. Mild cerebral white matter chronic small vessel ischemic disease.   MR BRAIN WO CONTRAST 04/01/2020 IMPRESSION:  1. Small acute infarct along the left frontal cortex.  2. 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   DG Chest Portable 1 View 03/31/2020 IMPRESSION:  1. No  acute finding or change from 2019.  2. Cardiomegaly.   CT HEAD CODE STROKE WO CONTRAST 04/01/2020 IMPRESSION:  1. Small acute infarct in the left frontal lobe by brain MRI 2 hours prior. No acute hemorrhage.  2. Remote left parietal and right frontal infarcts.  3. Presumed calcified embolism in the low left sylvian fissure.   Transthoracic Echocardiogram  00/00/2021 Pending  ECG - SR rate 72 BPM. Prolonged QT interval. QT/QTc 440/481 ms. (See cardiology reading for complete details)  PHYSICAL EXAM Blood pressure (!) 144/65, pulse 60, temperature 98.9 F (37.2 C), temperature source Oral, resp. rate 14, weight 112.6 kg, last menstrual period 08/12/2017, SpO2 98 %. Morbidly obese middle-age African-American lady not in distress. . Afebrile. Head is nontraumatic. Neck is supple without bruit.    Cardiac exam no murmur or gallop. Lungs are clear to auscultation. Distal pulses are well felt.  Neurological Exam ;  Awake  Alert oriented x 3. Normal speech and language.eye movements full without nystagmus.fundi were not visualized. Vision acuity and fields appear normal. Hearing is normal. Palatal movements are normal. Face asymmetric with mild right nasolabial fold asymmetry.. Tongue midline. Normal strength, tone, reflexes and coordination. Normal sensation. Gait deferred.     ASSESSMENT/PLAN Erin Dean is a 54 y.o. female with history of ESRD on HD Tu, Th, Sat, (unable to complete her HD sessions lately related to intermittent chest pain), strokes x 2 (residual LLE weakness with gait disturbance), obesity, current COVID+, HTN, SVT hx, and a left upper arm fistula repair for an infiltrated graft on 03/30/2020 who presented to [facility] after she woke up 12/23 with expressive aphasia, stuttering/halting speech, and intermittent numbness of the LEFT face, arm, leg, right facial droop. She did not receive tPA due to being OOW and low NIHSS. On 12/24 an MRI was done under sedation, due to  severe claustrophobia, which showed a small acute infarct along the LEFT frontal cortex with remote left parietal and right frontal cortex infarcts (inconsistent with presenting left-sided symptoms) After the MRI a stroke alert was activated due to right hemiparesis.  She did not receive IV t-PA due to sxs since 12/23. She was not a candidate for IR Thrombectomy as recent images showed no LVO, during CT head non-contrast, all symptoms resolved and unable to obtain CTA head/neck and CTP due to patient's severe claustrophobia.   Stroke: Small acute infarct in the left frontal lobe - embolic - source unknown but likely post fistulogram procedure She had transient neurological worsening following sedation for MRI 04/01/2020 with bizarre iillconsistent exam which appears to have resolved  Resultant expressive aphasia now resolved  Code Stroke CT Head - Small acute infarct in the left frontal lobe by brain MRI 2 hours prior. No acute hemorrhage. Remote left parietal and right frontal infarcts. Presumed calcified embolism in the low left sylvian fissure.   CT head - No evidence of acute infarct or acute intracranial hemorrhage. ASPECTS is 10 (when discounting chronic infarcts). Redemonstrated chronic cortically based infarcts within the right frontal and left parietal lobes. Mild cerebral white matter chronic small vessel ischemic disease.  MRI head - Small acute infarct along the left frontal cortex. 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   MRA head - not ordered  CTA H&N -  Focal calcification within a proximal left M2 MCA branch, which correlates with the calcification seen on same day noncontrast head CT. This results in severe stenosis with opacification of the more distal branches. Differential considerations include calcified embolus (paritcularly given calcification was not evident on prior CT from 2019 and given the patient's reported speech difficulty and reported recent dialysis  fistula intervention) versus progressive calcific atherosclerosis. Intracranial evaluation is limited by venous contamination and motion with a suspected moderate stenosis of the distal right M1 MCA.  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 Findings may be chronic given poor flow related signal of the right intradural vertebral artery on prior MRA head from 04/18/2017. 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 Bilateral carotid bifurcation atherosclerosis without evidence of greater than 50% narrowing.   CT Perfusion - not ordered  Carotid Doppler - CTA neck ordered - carotid dopplers not indicated.  2D Echo - pending  Lacey Jensen Virus 2 - positive  LDL - 97  HgbA1c - 5.3   UDS - pending  VTE prophylaxis - [place] Heparin Diet  Diet Order            Diet regular Room service appropriate? Yes; Fluid consistency: Thin  Diet effective now                 aspirin 81 mg daily and clopidogrel 75 mg daily prior to admission, now on aspirin 81 mg daily and clopidogrel 75 mg daily  Patient counseled to be compliant with her antithrombotic medications  Ongoing aggressive stroke risk factor management  Therapy recommendations:  pending  Disposition:  Pending  Hypertension  Home BP meds: Hydralazine ; Coreg ; Catapres ; Norvasc  Current BP meds: none   Stable . Permissive hypertension (OK if < 220/120) but gradually normalize in 5-7 days  . Long-term BP goal normotensive  Hyperlipidemia  Home Lipid lowering medication: Lipitor 40 mg daily   LDL 97, goal < 70  Current lipid lowering medication: Lipitor 80 mg daily   Continue statin at discharge  Previous Stroke Workup  Hx of elevated homocysteine  CRP - 2.2->2.6->1.9  MTHFR - pending  Platelet Function Assay - 176 (H)  Ferritin - 1,496  (H)  Echocardiogram showed severely dilated left atrium in past  Loop implant Jan 2019 Dr Curt Bears [facility] interrogations have been free of afib or other malignant dysrrhythmia.   An extensive hypercoagulability and autoimmune workup has been done. ESR was elevated, but CRP was OK. She did not have clear evidence of vasculitis on prior imaging.    SPEP/IFE and syphilis serology - pending  TEE 2019 - No cardiac source of emboli was indentified.   Other  Stroke Risk Factors  Obesity, Body mass index is 37.75 kg/m., recommend weight loss, diet and exercise as appropriate   Family hx stroke (grandmother)   Hx stroke/TIA  Other Active Problems, Findings and Recommendations  Code status - Full code  Prolonged QT interval on ECG - QT/QTc 440/481 ms  Covid positive - Remdesivir ; solumedrol ; Zinc ; vitamin C ; albuterol   ESRD  Leukocytosis - WBC's - 17.1 (afebrile)  Anemia of chronic disease   Severe claustrophobia  Hospital day # 2 Patient is presented with recurrent embolic stroke this time following fistulogram procedure possibly as a complication but has history of cryptogenic strokes with negative work-up in the past.  Recommend change Plavix to Brilinta if she can afford it.  Aspirin 81 mg daily and Brilinta 90 mg twice daily for 30 days followed by Brilinta alone if she is able to afford it otherwise aspirin alone.  Check P2 Y 12 to see if Plavix was being effective or not.  Check echocardiogram results.  Long discussion with patient and Dr. Emeline Gins and answered questions.  Greater than 50% time during this 35-minute visit was spent on counseling and coordination of care about her embolic stroke and answering questions Antony Contras, MD To contact Stroke Continuity provider, please refer to [email or web address] After hours, contact General Neurology

## 2020-04-03 ENCOUNTER — Encounter (HOSPITAL_COMMUNITY): Payer: Self-pay | Admitting: Radiology

## 2020-04-03 LAB — MPO/PR-3 (ANCA) ANTIBODIES
ANCA Proteinase 3: <3.5 U/mL (ref 0.0–3.5)
Myeloperoxidase Abs: <9 U/mL (ref 0.0–9.0)

## 2020-04-03 LAB — CBC WITH DIFFERENTIAL/PLATELET
Abs Immature Granulocytes: 0.22 10*3/uL — ABNORMAL HIGH (ref 0.00–0.07)
Basophils Absolute: 0.1 10*3/uL (ref 0.0–0.1)
Basophils Relative: 0 %
Eosinophils Absolute: 0.2 10*3/uL (ref 0.0–0.5)
Eosinophils Relative: 1 %
HCT: 32.2 % — ABNORMAL LOW (ref 36.0–46.0)
Hemoglobin: 9.8 g/dL — ABNORMAL LOW (ref 12.0–15.0)
Immature Granulocytes: 2 %
Lymphocytes Relative: 20 %
Lymphs Abs: 2.7 10*3/uL (ref 0.7–4.0)
MCH: 29.9 pg (ref 26.0–34.0)
MCHC: 30.4 g/dL (ref 30.0–36.0)
MCV: 98.2 fL (ref 80.0–100.0)
Monocytes Absolute: 1.5 10*3/uL — ABNORMAL HIGH (ref 0.1–1.0)
Monocytes Relative: 11 %
Neutro Abs: 9.3 10*3/uL — ABNORMAL HIGH (ref 1.7–7.7)
Neutrophils Relative %: 66 %
Platelets: 226 10*3/uL (ref 150–400)
RBC: 3.28 MIL/uL — ABNORMAL LOW (ref 3.87–5.11)
RDW: 14.7 % (ref 11.5–15.5)
WBC: 14 10*3/uL — ABNORMAL HIGH (ref 4.0–10.5)
nRBC: 0 % (ref 0.0–0.2)

## 2020-04-03 LAB — COMPREHENSIVE METABOLIC PANEL
ALT: 14 U/L (ref 0–44)
AST: 23 U/L (ref 15–41)
Albumin: 3.1 g/dL — ABNORMAL LOW (ref 3.5–5.0)
Alkaline Phosphatase: 65 U/L (ref 38–126)
Anion gap: 17 — ABNORMAL HIGH (ref 5–15)
BUN: 50 mg/dL — ABNORMAL HIGH (ref 6–20)
CO2: 26 mmol/L (ref 22–32)
Calcium: 7.9 mg/dL — ABNORMAL LOW (ref 8.9–10.3)
Chloride: 96 mmol/L — ABNORMAL LOW (ref 98–111)
Creatinine, Ser: 10.25 mg/dL — ABNORMAL HIGH (ref 0.44–1.00)
GFR, Estimated: 4 mL/min — ABNORMAL LOW (ref >60)
Glucose, Bld: 96 mg/dL (ref 70–99)
Potassium: 4.7 mmol/L (ref 3.5–5.1)
Sodium: 139 mmol/L (ref 135–145)
Total Bilirubin: 0.6 mg/dL (ref 0.3–1.2)
Total Protein: 7.9 g/dL (ref 6.5–8.1)

## 2020-04-03 LAB — GLUCOSE, CAPILLARY
Glucose-Capillary: 85 mg/dL (ref 70–99)
Glucose-Capillary: 83 mg/dL (ref 70–99)

## 2020-04-03 LAB — SAR COV2 SEROLOGY (COVID19)AB(IGG),IA: SARS-CoV-2 Ab, IgG: REACTIVE — AB

## 2020-04-03 MED ORDER — AMLODIPINE BESYLATE 10 MG PO TABS: 10.0000 mg | ORAL_TABLET | Freq: Every day | ORAL | 3 refills | Status: AC

## 2020-04-03 MED ORDER — CLONIDINE HCL 0.1 MG PO TABS: 0.1000 mg | ORAL_TABLET | Freq: Three times a day (TID) | ORAL | 11 refills | Status: DC

## 2020-04-03 MED ORDER — ACETAMINOPHEN 325 MG PO TABS: 650.0000 mg | ORAL_TABLET | Freq: Four times a day (QID) | ORAL | Status: AC | PRN

## 2020-04-03 MED ORDER — CARVEDILOL 12.5 MG PO TABS
12.5000 mg | ORAL_TABLET | Freq: Two times a day (BID) | ORAL | Status: DC
  Administered 2020-04-03: 12.5 mg via ORAL
  Filled 2020-04-03: qty 1

## 2020-04-03 MED ORDER — TICAGRELOR 90 MG PO TABS: 90.0000 mg | ORAL_TABLET | Freq: Two times a day (BID) | ORAL | 1 refills | Status: DC

## 2020-04-03 MED ORDER — FUROSEMIDE 10 MG/ML IJ SOLN
80.0000 mg | Freq: Once | INTRAMUSCULAR | Status: AC
  Administered 2020-04-03: 80 mg via INTRAVENOUS
  Filled 2020-04-03: qty 8

## 2020-04-03 MED ORDER — ATORVASTATIN CALCIUM 80 MG PO TABS: 80.0000 mg | ORAL_TABLET | Freq: Every day | ORAL | 0 refills | Status: AC

## 2020-04-03 MED ORDER — HYDRALAZINE HCL 100 MG PO TABS: 100.0000 mg | ORAL_TABLET | Freq: Three times a day (TID) | ORAL | 2 refills | Status: DC

## 2020-04-03 MED ORDER — ASPIRIN EC 81 MG PO TBEC: 81.0000 mg | DELAYED_RELEASE_TABLET | Freq: Every day | ORAL | 0 refills | Status: AC

## 2020-04-03 NOTE — TOC Transition Note (Signed)
Transition of Care [facility]) - CM/SW Discharge Note   Patient Details  Name: Erin Dean MRN: [id number] Date of Birth: 09/22/1965  [facility] CM/SW Contact:  Carles Collet, RN Phone Number: 04/03/2020, 11:47 AM   Clinical Narrative:    Kary Kos card sent to unit, nurse notified that it is in tube system and instructed to give to patient. Spoke w patient and discussed instructions for use. Brilinta is covered through [facility] coverage as well. Patient agreeable to OP PT. She understands that first visit will be after her 21 days from positive test (Jan 13th). Referral placed through Epic to [facility] Patient declined need for DME suggested by PT OT evals. No further CM needs identified.     Final next level of care: Home/Self Care Barriers to Discharge: No Barriers Identified   Patient Goals and CMS Choice Patient states their goals for this hospitalization and ongoing recovery are:: to go home CMS Medicare.gov Compare Post Acute Care list provided to:: Patient Choice offered to / list presented to : Patient  Discharge Placement                       Discharge Plan and Services In-house Referral: Clinical Social Work Discharge Planning Services: CM Consult            DME Arranged: N/A                    Social Determinants of Health (SDOH) Interventions     Readmission Risk Interventions No flowsheet data found.

## 2020-04-03 NOTE — Progress Notes (Signed)
STROKE TEAM PROGRESS NOTE      INTERVAL HISTORY Patient states she is doing well.  She wants to go home.  She has no active Covid symptoms oxygen requirements.  2D echo shows normal ejection fraction but shows an echodensity on the coronary cusp of the aortic valve which will need TEE for further evaluation but this could be arranged as an outpatient after discharge.  Vital signs are stable.  Neurological exam unchanged    OBJECTIVE Vitals:   04/02/20 2013 04/03/20 0030 04/03/20 0438 04/03/20 0743  BP:  (!) 156/88  (!) 187/97  Pulse:    74  Resp:    18  Temp:  98.8 F (37.1 C) 98.7 F (37.1 C) 98.3 F (36.8 C)  TempSrc:  Oral Oral Oral  SpO2:  91% 95% 98%  Weight: 113.9 kg     Height: '5\' 8"'  (1.727 m)       CBC:  Recent Labs  Lab 04/02/20 0148 04/03/20 0429  WBC 17.1* 14.0*  NEUTROABS 14.4* 9.3*  HGB 9.5* 9.8*  HCT 28.8* 32.2*  MCV 94.4 98.2  PLT 215 007    Basic Metabolic Panel:  Recent Labs  Lab 04/01/20 0115 04/01/20 0418 04/02/20 0148 04/02/20 0747 04/02/20 0959 04/03/20 0429  NA 135  --  135  --   --  139  K 6.4*   < > 6.2*   < > 3.8 4.7  CL 93*  --  90*  --   --  96*  CO2 23  --  24  --   --  26  GLUCOSE 123*  --  112*  --   --  96  BUN 77*  --  99*  --   --  50*  CREATININE 14.01*  --  15.82*  --   --  10.25*  CALCIUM 7.8*  --  7.9*  --   --  7.9*  PHOS 9.8*  --   --   --   --   --    < > = values in this interval not displayed.    Lipid Panel:     Component Value Date/Time   CHOL 144 04/01/2020 0115   TRIG 72 04/01/2020 0115   HDL 33 (L) 04/01/2020 0115   CHOLHDL 4.4 04/01/2020 0115   VLDL 14 04/01/2020 0115   LDLCALC 97 04/01/2020 0115   HgbA1c:  Lab Results  Component Value Date   HGBA1C 5.3 03/31/2020   Urine Drug Screen: No results found for: LABOPIA, COCAINSCRNUR, LABBENZ, AMPHETMU, THCU, LABBARB  Alcohol Level     Component Value Date/Time   ETH <10 03/09/2018 1532    IMAGING  CT Angio Head W or Wo Contrast CT Angio Neck  W and/or Wo Contrast 03/31/2020   IMPRESSION:  1. Focal calcification within a proximal left M2 MCA branch, which correlates with the calcification seen on same day noncontrast head CT. This results in severe stenosis with opacification of the more distal branches. Differential considerations include calcified embolus (paritcularly given calcification was not evident on prior CT from 2019 and given the patient's reported speech difficulty and reported recent dialysis fistula intervention) versus progressive calcific atherosclerosis.  2. Intracranial evaluation is limited by venous contamination and motion with a suspected moderate stenosis of the distal right M1 MCA.  3. 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 Findings may be chronic given poor flow related signal  of the right intradural vertebral artery on prior MRA head from 04/18/2017.  4. 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  5. Bilateral carotid bifurcation atherosclerosis without evidence of greater than 50% narrowing.   CT HEAD WO CONTRAST 03/31/2020 IMPRESSION:  No evidence of acute infarct or acute intracranial hemorrhage. ASPECTS is 10 (when discounting chronic infarcts).  Focus of calcification in the left sylvian fissure in the location of the proximal M2 left middle cerebral artery. This is a new finding as compared to the head CT of 03/10/2018.  Differential considerations include calcified embolus versus interval development of calcified plaque at this site.  Consider CTA for further evaluation (particularly given the provided history of speech difficulty).  Redemonstrated chronic cortically based infarcts within the right frontal and left parietal lobes. Mild cerebral white matter chronic small vessel ischemic disease.   MR BRAIN WO  CONTRAST 04/01/2020 IMPRESSION:  1. Small acute infarct along the left frontal cortex.  2. 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   DG Chest Portable 1 View 03/31/2020 IMPRESSION:  1. No acute finding or change from 2019.  2. Cardiomegaly.   CT HEAD CODE STROKE WO CONTRAST 04/01/2020 IMPRESSION:  1. Small acute infarct in the left frontal lobe by brain MRI 2 hours prior. No acute hemorrhage.  2. Remote left parietal and right frontal infarcts.  3. Presumed calcified embolism in the low left sylvian fissure.   Transthoracic Echocardiogram  04/02/20 IMPRESSIONS  1. When compared to the prior study from 03/10/2018 aortc valve is now  thicker with a possible mobile echodensity attached to the left coronary  cusp. A TEE is recommended for further evaluation.  2. Left ventricular ejection fraction, by estimation, is 65 to 70%. The  left ventricle has normal function. The left ventricle has no regional  wall motion abnormalities. Left ventricular diastolic parameters are  consistent with Grade II diastolic  dysfunction (pseudonormalization). Elevated left atrial pressure.  3. Right ventricular systolic function is normal. The right ventricular  size is normal. There is mildly elevated pulmonary artery systolic  pressure.  4. Left atrial size was mildly dilated.  5. The mitral valve is normal in structure. No evidence of mitral valve  regurgitation. No evidence of mitral stenosis.  6. The aortic valve is normal in structure. There is mild calcification  of the aortic valve. There is severe thickening of the aortic valve.  Aortic valve regurgitation is mild. No aortic stenosis is present.  7. The inferior vena cava is normal in size with greater than 50%  respiratory variability, suggesting right atrial pressure of 3 mmHg.   ECG - SR rate 72 BPM. Prolonged QT interval. QT/QTc 440/481 ms. (See cardiology reading for complete details)  PHYSICAL EXAM Blood  pressure (!) 187/97, pulse 74, temperature 98.3 F (36.8 C), temperature source Oral, resp. rate 18, height '5\' 8"'  (1.727 m), weight 113.9 kg, last menstrual period 08/12/2017, SpO2 98 %. Morbidly obese middle-age African-American lady not in distress. . Afebrile. Head is nontraumatic. Neck is supple without bruit.    Cardiac exam no murmur or gallop. Lungs are clear to auscultation. Distal pulses are well felt.  Neurological Exam ;  Awake  Alert oriented x 3. Normal speech and language.eye movements full without nystagmus.fundi were not visualized. Vision acuity and fields appear normal. Hearing is normal. Palatal movements are normal. Face asymmetric with mild right nasolabial fold asymmetry.. Tongue midline. Normal strength, tone, reflexes and coordination. Normal sensation. Gait deferred.  ASSESSMENT/PLAN Ms. TIASHA HELVIE is a 54 y.o. female with history of ESRD on HD Tu, Th, Sat, (unable to complete her HD sessions lately related to intermittent chest pain), strokes x 2 (residual LLE weakness with gait disturbance), obesity, current COVID+, HTN, SVT hx, and a left upper arm fistula repair for an infiltrated graft on 03/30/2020 who presented to [facility] after she woke up 12/23 with expressive aphasia, stuttering/halting speech, and intermittent numbness of the LEFT face, arm, leg, right facial droop. She did not receive tPA due to being OOW and low NIHSS. On 12/24 an MRI was done under sedation, due to severe claustrophobia, which showed a small acute infarct along the LEFT frontal cortex with remote left parietal and right frontal cortex infarcts (inconsistent with presenting left-sided symptoms) After the MRI a stroke alert was activated due to right hemiparesis.  She did not receive IV t-PA due to sxs since 12/23. She was not a candidate for IR Thrombectomy as recent images showed no LVO, during CT head non-contrast, all symptoms resolved and unable to obtain CTA head/neck and CTP due to patient's  severe claustrophobia.   Stroke: Small acute infarct in the left frontal lobe - embolic - source unknown but likely post fistulogram procedure She had transient neurological worsening following sedation for MRI 04/01/2020 with bizarre iillconsistent exam which appears to have resolved  Resultant expressive aphasia now resolved  Code Stroke CT Head - Small acute infarct in the left frontal lobe by brain MRI 2 hours prior. No acute hemorrhage. Remote left parietal and right frontal infarcts. Presumed calcified embolism in the low left sylvian fissure.   CT head - No evidence of acute infarct or acute intracranial hemorrhage. ASPECTS is 10 (when discounting chronic infarcts). Redemonstrated chronic cortically based infarcts within the right frontal and left parietal lobes. Mild cerebral white matter chronic small vessel ischemic disease.  MRI head - Small acute infarct along the left frontal cortex. 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   MRA head - not ordered  CTA H&N -  Focal calcification within a proximal left M2 MCA branch, which correlates with the calcification seen on same day noncontrast head CT. This results in severe stenosis with opacification of the more distal branches. Differential considerations include calcified embolus (paritcularly given calcification was not evident on prior CT from 2019 and given the patient's reported speech difficulty and reported recent dialysis fistula intervention) versus progressive calcific atherosclerosis. Intracranial evaluation is limited by venous contamination and motion with a suspected moderate stenosis of the distal right M1 MCA.  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 Findings may be chronic given poor flow related signal of the right intradural vertebral artery on prior MRA head from  04/18/2017. 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 Bilateral carotid bifurcation atherosclerosis without evidence of greater than 50% narrowing.   CT Perfusion - not ordered  Carotid Doppler - CTA neck ordered - carotid dopplers not indicated.  2D Echo - EF 65 - 70%. aortc valve is now thicker with a possible mobile echodensity attached to the left coronary cusp. A TEE is recommended for further evaluation.   Sars Corona Virus 2 - positive  LDL - 97  HgbA1c - 5.3   UDS - pending  VTE prophylaxis - [place] Heparin Diet  Diet Order  Diet NPO time specified  Diet effective midnight           Diet regular Room service appropriate? Yes; Fluid consistency: Thin  Diet effective now                 aspirin 81 mg daily and clopidogrel 75 mg daily prior to admission, now on aspirin 81 mg daily and Brilinta 90 mg twice  daily  Patient counseled to be compliant with her antithrombotic medications  Ongoing aggressive stroke risk factor management  Therapy recommendations:  pending  Disposition:  Pending  Hypertension  Home BP meds: Hydralazine ; Coreg ; Catapres ; Norvasc  Current BP meds: none   Stable . Permissive hypertension (OK if < 220/120) but gradually normalize in 5-7 days  . Long-term BP goal normotensive  Hyperlipidemia  Home Lipid lowering medication: Lipitor 40 mg daily   LDL 97, goal < 70  Current lipid lowering medication: Lipitor 80 mg daily   Continue statin at discharge  Previous Stroke Workup  Hx of elevated homocysteine  CRP - 2.2->2.6->1.9  MTHFR DNA Analysis - pending  Platelet Function Assay - 176 (H)  Ferritin - 1,496 (H)  Echocardiogram showed severely dilated left atrium in past  Loop implant Jan 2019 Dr Curt Bears [facility] interrogations have been free of afib or other malignant dysrrhythmia.   An extensive hypercoagulability and autoimmune workup has been done. ESR was elevated, but CRP was OK.  She did not have clear evidence of vasculitis on prior imaging.    SPEP/IFE - pending   Syphilis serology - pending  RPR - non reactive  TEE 2019 - No cardiac source of emboli was indentified.   Other Stroke Risk Factors  Obesity, Body mass index is 38.18 kg/m., recommend weight loss, diet and exercise as appropriate   Family hx stroke (grandmother)   Hx stroke/TIA  Other Active Problems, Findings and Recommendations  Code status - Full code  Prolonged QT interval on ECG - QT/QTc 440/481 ms  Covid positive - Remdesivir ; solumedrol ; Zinc ; vitamin C ; albuterol   ESRD  Leukocytosis - WBC's - 17.1->14.0 (afebrile)  Anemia of chronic disease - Hgb 9.5->9.8  Severe claustrophobia  Possible mobile echodensity attached to the aortic valve left coronary cusp. A TEE is recommended for further evaluation. Pt may need anticoagulation.  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  Recommend change Plavix to Brilinta if she can afford it.  Aspirin 81 mg daily and Brilinta 90 mg twice daily for 30 days followed by Brilinta alone if she is able to afford it otherwise aspirin alone.  2D echo suggests a new mobile hypodensity on the aortic valve which ideally will need need further evaluation with a TEE to look for vegetations or clot although patient seemed extremely reluctant to stay and her Covid positive status may delay getting a TEE hence patient may be discharged with plans to come back once she is outside the Covid quarantine period for TEE to further evaluate mobile echodensity aortic valve.  Long discussion with patient and Dr. Emeline Gins and answered questions.  Greater than 50% time during this 25-minute visit was spent on counseling and coordination of care about her embolic stroke and answering  questions.  Stroke team will sign off.  Follow-up as outpatient stroke clinic in  6 weeks Antony Contras, MD To contact Stroke Continuity provider, please refer to [email or web address] After hours, contact General Neurology

## 2020-04-03 NOTE — Progress Notes (Signed)
Patient ID: Erin Dean, female   DOB: [birth year], 54 y.o.   MRN: [id number] [facility] Progress Note   Assessment/ Plan:   1.  CVA symptoms with left-sided weakness with loss of speech fluency: Negative imaging for acute CVA and without additional/new emergent neurological complaints. 2.  Covid infection: She has been vaccinated (but has not received a booster).  Does not appear to be symptomatic from a respiratory standpoint and received remdesivir. 3. ESRD: She is usually on a TTS dialysis schedule and underwent hemodialysis late yesterday night in Covid isolation unit.  Process initiated for outpatient dialysis unit placement on the Covid shift; the nurse manager from [facility] will call the patient with specific information/details upon discharge. 4. Anemia: Without overt blood loss and currently hemoglobin and hematocrit within acceptable range. 5. CKD-MBD: Significantly elevated phosphorus level noted, continue Sensipar and calcitriol for PTH control and phosphorus binders. 6. Nutrition: Tolerating current diet without any limitations/aspiration. 7. Hypertension: Blood pressure intermittently elevated, continue to monitor on current antihypertensive therapy/hemodialysis.  Subjective:   Continues to report improvement and denies any concerning neurological symptoms, chest pain or shortness of breath.  Appears clinically stable to discharge home today.   Objective:   BP (!) 187/97 (BP Location: Right Arm)   Pulse 74   Temp 98.3 F (36.8 C) (Oral)   Resp 18   Ht 5\' 8"  (1.727 m)   Wt 113.9 kg   LMP 08/12/2017   SpO2 98%   BMI 38.18 kg/m   Physical Exam: Patient not personally seen in order to preserve PPE and limit direct contact with COVID-19.  Pertinent positive findings discussed with Dr. Waldron Labs.  Labs: BMET Recent Labs  Lab 03/31/20 [phone] 03/31/20 0622 04/01/20 0115 04/01/20 0418 04/01/20 1120 04/01/20 1519 04/01/20 1837  04/02/20 0148 04/02/20 0747 04/02/20 0959 04/03/20 0429  NA 137 138 135  --   --   --   --  135  --   --  139  K 5.6* 5.5* 6.4*   < > 5.4* 6.7* 5.9* 6.2* 3.9 3.8 4.7  CL 94* 96* 93*  --   --   --   --  90*  --   --  96*  CO2 25  --  23  --   --   --   --  24  --   --  26  GLUCOSE 94 88 123*  --   --   --   --  112*  --   --  96  BUN 68* 69* 77*  --   --   --   --  99*  --   --  50*  CREATININE 12.72* 13.00* 14.01*  --   --   --   --  15.82*  --   --  10.25*  CALCIUM 7.5*  --  7.8*  --   --   --   --  7.9*  --   --  7.9*  PHOS  --   --  9.8*  --   --   --   --   --   --   --   --    < > = values in this interval not displayed.   CBC Recent Labs  Lab 03/31/20 0616 03/31/20 0622 04/01/20 0115 04/02/20 0148 04/03/20 0429  WBC 8.6  --  7.6 17.1* 14.0*  NEUTROABS 4.8  --  6.7 14.4* 9.3*  HGB 11.0* 11.6* 10.4* 9.5* 9.8*  HCT 34.2*  34.0* 31.2* 28.8* 32.2*  MCV 96.9  --  93.1 94.4 98.2  PLT 198  --  210 215 226     Medications:    .  stroke: mapping our early stages of recovery book   Does not apply Once  . albuterol  2 puff Inhalation Q6H  . vitamin C  500 mg Oral Daily  . aspirin EC  81 mg Oral Daily  . atorvastatin  80 mg Oral q1800  . calcitRIOL  3.75 mcg Oral Q M,W,F-HD  . carvedilol  12.5 mg Oral BID WC  . Chlorhexidine Gluconate Cloth  6 each Topical Q0600  . cinacalcet  180 mg Oral Q M,W,F-HD  . escitalopram  10 mg Oral Daily  . heparin  5,000 Units Subcutaneous Q8H  . insulin aspart  0-6 Units Subcutaneous TID WC  . pantoprazole  40 mg Oral Daily  . sevelamer carbonate  3,200 mg Oral TID WC  . ticagrelor  90 mg Oral BID  . zinc sulfate  220 mg Oral Daily   Elmarie Shiley, MD 04/03/2020, 11:26 AM

## 2020-04-03 NOTE — Discharge Instructions (Signed)
Person Under Monitoring Name: Erin Dean  Location: [place] [id number]   Infection Prevention Recommendations for Individuals Confirmed to have, or Being Evaluated for, 2019 Novel Coronavirus (COVID-19) Infection Who Receive Care at Home  Individuals who are confirmed to have, or are being evaluated for, COVID-19 should follow the prevention steps below until a healthcare provider or local or state health department says they can return to normal activities.  Stay home except to get medical care You should restrict activities outside your home, except for getting medical care. Do not go to work, school, or public areas, and do not use public transportation or taxis.  Call ahead before visiting your doctor Before your medical appointment, call the healthcare provider and tell them that you have, or are being evaluated for, COVID-19 infection. This will help the healthcare provider's office take steps to keep other people from getting infected. Ask your healthcare provider to call the local or state health department.  Monitor your symptoms Seek prompt medical attention if your illness is worsening (e.g., difficulty breathing). Before going to your medical appointment, call the healthcare provider and tell them that you have, or are being evaluated for, COVID-19 infection. Ask your healthcare provider to call the local or state health department.  Wear a facemask You should wear a facemask that covers your nose and mouth when you are in the same room with other people and when you visit a healthcare provider. People who live with or visit you should also wear a facemask while they are in the same room with you.  Separate yourself from other people in your home As much as possible, you should stay in a different room from other people in your home. Also, you should use a separate bathroom, if available.  Avoid sharing household items You  should not share dishes, drinking glasses, cups, eating utensils, towels, bedding, or other items with other people in your home. After using these items, you should wash them thoroughly with soap and water.  Cover your coughs and sneezes Cover your mouth and nose with a tissue when you cough or sneeze, or you can cough or sneeze into your sleeve. Throw used tissues in a lined trash can, and immediately wash your hands with soap and water for at least 20 seconds or use an alcohol-based hand rub.  Wash your [facility] your hands often and thoroughly with soap and water for at least 20 seconds. You can use an alcohol-based hand sanitizer if soap and water are not available and if your hands are not visibly dirty. Avoid touching your eyes, nose, and mouth with unwashed hands.   Prevention Steps for Caregivers and Household Members of Individuals Confirmed to have, or Being Evaluated for, COVID-19 Infection Being Cared for in the Home  If you live with, or provide care at home for, a person confirmed to have, or being evaluated for, COVID-19 infection please follow these guidelines to prevent infection:  Follow healthcare provider's instructions Make sure that you understand and can help the patient follow any healthcare provider instructions for all care.  Provide for the patient's basic needs You should help the patient with basic needs in the home and provide support for getting groceries, prescriptions, and other personal needs.  Monitor the patient's symptoms If they are getting sicker, call his or her medical provider and tell them that the patient has, or is being evaluated for, COVID-19 infection. This will help the  healthcare provider's office take steps to keep other people from getting infected. Ask the healthcare provider to call the local or state health department.  Limit the number of people who have contact with the patient  If possible, have only one caregiver for the  patient.  Other household members should stay in another home or place of residence. If this is not possible, they should stay  in another room, or be separated from the patient as much as possible. Use a separate bathroom, if available.  Restrict visitors who do not have an essential need to be in the home.  Keep older adults, very young children, and other sick people away from the patient Keep older adults, very young children, and those who have compromised immune systems or chronic health conditions away from the patient. This includes people with chronic heart, lung, or kidney conditions, diabetes, and cancer.  Ensure good ventilation Make sure that shared spaces in the home have good air flow, such as from an air conditioner or an opened window, weather permitting.  Wash your hands often  Wash your hands often and thoroughly with soap and water for at least 20 seconds. You can use an alcohol based hand sanitizer if soap and water are not available and if your hands are not visibly dirty.  Avoid touching your eyes, nose, and mouth with unwashed hands.  Use disposable paper towels to dry your hands. If not available, use dedicated cloth towels and replace them when they become wet.  Wear a facemask and gloves  Wear a disposable facemask at all times in the room and gloves when you touch or have contact with the patient's blood, body fluids, and/or secretions or excretions, such as sweat, saliva, sputum, nasal mucus, vomit, urine, or feces.  Ensure the mask fits over your nose and mouth tightly, and do not touch it during use.  Throw out disposable facemasks and gloves after using them. Do not reuse.  Wash your hands immediately after removing your facemask and gloves.  If your personal clothing becomes contaminated, carefully remove clothing and launder. Wash your hands after handling contaminated clothing.  Place all used disposable facemasks, gloves, and other waste in a lined  container before disposing them with other household waste.  Remove gloves and wash your hands immediately after handling these items.  Do not share dishes, glasses, or other household items with the patient  Avoid sharing household items. You should not share dishes, drinking glasses, cups, eating utensils, towels, bedding, or other items with a patient who is confirmed to have, or being evaluated for, COVID-19 infection.  After the person uses these items, you should wash them thoroughly with soap and water.  Wash laundry thoroughly  Immediately remove and wash clothes or bedding that have blood, body fluids, and/or secretions or excretions, such as sweat, saliva, sputum, nasal mucus, vomit, urine, or feces, on them.  Wear gloves when handling laundry from the patient.  Read and follow directions on labels of laundry or clothing items and detergent. In general, wash and dry with the warmest temperatures recommended on the label.  Clean all areas the individual has used often  Clean all touchable surfaces, such as counters, tabletops, doorknobs, bathroom fixtures, toilets, phones, keyboards, tablets, and bedside tables, every day. Also, clean any surfaces that may have blood, body fluids, and/or secretions or excretions on them.  Wear gloves when cleaning surfaces the patient has come in contact with.  Use a diluted bleach solution (e.g., dilute bleach  with 1 part bleach and 10 parts water) or a household disinfectant with a label that says EPA-registered for coronaviruses. To make a bleach solution at home, add 1 tablespoon of bleach to 1 quart (4 cups) of water. For a larger supply, add  cup of bleach to 1 gallon (16 cups) of water.  Read labels of cleaning products and follow recommendations provided on product labels. Labels contain instructions for safe and effective use of the cleaning product including precautions you should take when applying the product, such as wearing gloves or  eye protection and making sure you have good ventilation during use of the product.  Remove gloves and wash hands immediately after cleaning.  Monitor yourself for signs and symptoms of illness Caregivers and household members are considered close contacts, should monitor their health, and will be asked to limit movement outside of the home to the extent possible. Follow the monitoring steps for close contacts listed on the symptom monitoring form.   ? If you have additional questions, contact your local health department or call the epidemiologist on call at [phone] (available 24/7). ? This guidance is subject to change. For the most up-to-date guidance from [facility], please refer to their website: [email or web address]

## 2020-04-03 NOTE — Discharge Summary (Signed)
AAISHA SLITER, is a 54 y.o. female  DOB [birth year]  MRN [id number].  Admission date:  03/31/2020  Admitting Physician  Karmen Bongo, MD  Discharge Date:  04/03/2020   Primary MD  Lois Huxley, PA  Recommendations for primary care physician for things to follow:  -Patient to continue hemodialysis on TTS schedule currently given her positive COVID-19 status. -Please check CBC, CMP during next visit -Patient to follow with neurology as an outpatient, she is to continue Brilinta and aspirin for next month, then Brilinta alone (if cannot afford Brilinta then aspirin only alone) -Cardiology will arrange for outpatient TEE once she after her COVID-19 isolation. Admission Diagnosis  Acute CVA (cerebrovascular accident) [facility]) [I63.9]   Discharge Diagnosis  Acute CVA (cerebrovascular accident) ([place]) [I63.9]    Principal Problem:   Acute CVA (cerebrovascular accident) [facility]) Active Problems:   Anxiety   Essential hypertension   Obesity   Hyperlipidemia   ESRD on dialysis ([place])   COVID-19 virus infection   Chronic diastolic (congestive) heart failure ([place])      Past Medical History:  Diagnosis Date  . ESRD (end stage renal disease) ([place])    [facility] [place]   . Hypertension   . Stroke ([place]) 04/2017   no residual . Limp left side  . SVT (supraventricular tachycardia) ([facility])     Past Surgical History:  Procedure Laterality Date  . AV FISTULA PLACEMENT Right 08/16/2017   Procedure: CREATION OF RADIOCEPHALIC VERSUS BRACHIOCEPHALIC ARTERIOVENOUS FISTULA RIGHT ARM;  Surgeon: Conrad Newellton, MD;  Location: [place];  Service: Vascular;  Laterality: Right;  . AV FISTULA PLACEMENT Right 02/07/2018   Procedure: Creation Right arm Brachiocephalic Fistula;  Surgeon: Marty Heck, MD;  Location: [facility] OR;  Service: Vascular;  Laterality: Right;  . AV FISTULA PLACEMENT Left 08/15/2018   Procedure: ARTERIOVENOUS  (AV) FISTULA CREATION;  Surgeon: Serafina Mitchell, MD;  Location: [place];  Service: Vascular;  Laterality: Left;  . BASCILIC VEIN TRANSPOSITION Right 04/07/2018   Procedure: SECOND STAGE BASILIC VEIN TRANSPOSITION RIGHT ARM;  Surgeon: Marty Heck, MD;  Location: [place];  Service: Vascular;  Laterality: Right;  . CESAREAN SECTION    . ESOPHAGOGASTRODUODENOSCOPY N/A 04/19/2017   Procedure: ESOPHAGOGASTRODUODENOSCOPY (EGD);  Surgeon: Carol Ada, MD;  Location: [place];  Service: Endoscopy;  Laterality: N/A;  . FISTULA SUPERFICIALIZATION Right 10/01/2017   Procedure: FISTULA SUPERFICIALIZATION RIGHT ARM;  Surgeon: Conrad McCook, MD;  Location: [place];  Service: Vascular;  Laterality: Right;  . FISTULA SUPERFICIALIZATION Left 10/08/2018   Procedure: FISTULA SUPERFICIALIZATION LEFT ARM;  Surgeon: Serafina Mitchell, MD;  Location: [place];  Service: Vascular;  Laterality: Left;  . IR FLUORO GUIDE CV LINE RIGHT  04/23/2018  . IR US GUIDE VASC ACCESS RIGHT  04/23/2018  . LOOP RECORDER INSERTION N/A 04/22/2017   Procedure: LOOP RECORDER INSERTION;  Surgeon: Constance Haw, MD;  Location: [place] CV LAB;  Service: Cardiovascular;  Laterality: N/A;  . TEE WITHOUT CARDIOVERSION N/A 04/19/2017   Procedure: TRANSESOPHAGEAL  ECHOCARDIOGRAM (TEE);  Surgeon: Carol Ada, MD;  Location: [place];  Service: Endoscopy;  Laterality: N/A;       History of present illness and  Hospital Course:     Kindly see H&P for history of present illness and admission details, please review complete Labs, Consult reports and Test reports for all details in brief  HPI  from the history and physical done on the day of admission 03/31/2020  HPI: ABILENE MCPHEE is a 54 y.o. 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 She reports h/o LLE weakness with gait disturbance from prior stroke.  She awoke this AM with expressive aphasia and also numbness of  L hand and foot.  The numbness has waxed and waned today.  Her aphasia has gotten progressively better.  Mild SOB with tachypnea periodically, otherwise no COVID symptoms and reports that she has received booster.  She did have fistulogram yesterday to assess her AV access.  She has had a headache, but this is currently better.  She has loop recorder in place and has not had evidence of afib on interrogation.  She reports inability to complete her last several HD sessions due to CP.  Patient reports need for anesthesia in order to obtain MRI.  Anesthesia can do this a number of ways for MRI.  Likely not conscious sedation, but needs anesthesia for it.  Will need to be NPO prior and will be done about 6PM today.  Needs consent signed.  ED Course: h/o CVA, had a stroke.  Woke up for HD today, couldn't talk.  EMS with R facial droop, LLE weakness, expressive aphasia.  Also with fistula repair yesterday.  Better initially with only word finding difficulties.  CT with L MCA calcification, not an intervention candidate.  Needs CTA.   Hospital Course    Acute CVA -Patient with left-sided weakness and numbness on presentation, she had acute right-sided hemiparesis this after MRI with sedation.  This has totally resolved. -Management per neurology.  Weakness secondary to sedation, versus acute left frontal lobe infarct by MRI, weakness improved rapidly with increased alertness.  She is back to baseline -She is  on aspirin, Plavix and atorvastatin, transition to aspirin and Brilinta x30 days, then Brilinta alone if she is able to afford it, otherwise aspirin alone.  -PT/OT/SLP consulted, recommendation for outpatient PT  Possible mobile echodensity attached to the aortic valve left coronary cusp. A TEE is recommended for further evaluation.   Patient will need TEE, this can be done as an outpatient given she is in COVID-19 isolation currently,and patient  wants to be discharged today, discussed with  cardiology PA  Sharolyn Douglas , TEE will be arranged as an outpatient   Acute encephalopathy/unspecified -Ischemic versus metabolic -Patient had an episode of altered mental status, minimally responsive upon coming back from MRI with anesthesia where she was intubated, she did receive propofol, fentanyl she did receive Narcan x5, with improvement of mental status, for which she was noted after to have some right side weakness, this has totally resolved. -Resolved, mentation back at baseline  COVID 19 infection -Appears to have breakthrough COVID infection despite vaccine plus booster -COVID POSITIVE, and confirmation test is also positive -The patient has comorbidities which may increase the risk for ARDS/MODS including: HTN, DM, obesity,  -Patient is with no hypoxia, no evidence of pneumonia on chest x-ray, no indication for steroids or remdesivir, -received monoclonal antibody 12/24  HTN -Allow permissive  HTN initially, on multiple medications, blood pressure started to increase, so she is back on her Coreg and reduced dose Imdur during hospital stay, her medication will be resumed gradually as an outpatient, she will be discharged on her Coreg, Imdur and Lasix on discharge, to resume hydralazine and clonidine stepwise manner over the next few days.  HLD -LDL is 97, Lipitor was increased from 40 mg to 80 mg on discharge  ESRD / hyperkalemia -Nephrology consulted, she was dialyzed, to resume her hemodialysis on TTS schedule, she is euvolemic with normal potassium at time of discharge, she received 80 mg of IV Lasix before discharge.  Chronic diastolic CHF -Echo in 62/2297 with preserved EF and grade 2 diastolic dysfunction -Volume control with HD  Anxiety and Depression -Continue Xanax, Lexapro  Obesity -BMI 38.8 (prior weight; repeat pending) -Weight loss should be encouraged -Outpatient PCP/bariatric medicine f/u encouraged     Discharge Condition:  stable   Follow  UP   Follow-up Information    [facility] Follow up.   Specialty: Rehabilitation Why: They will call you next week to schedule an appointment for after January 13th.  Contact information: [address] [id number] mc [place] [phone]                Discharge Instructions  and  Discharge Medications   *  Discharge Instructions    Ambulatory referral to Physical Therapy   Complete by: As directed    +COVID 12/23, will be 21 days out on Jan 13th, patient is aware that appointment will be after that date.   Discharge instructions   Complete by: As directed    Follow with Primary MD Lois Huxley, PA in  10 days   Get CBC, CMP,  checked  by Primary MD next visit.    Activity: As tolerated with Full fall precautions use walker/cane & assistance as needed   Disposition Home    Diet: Heart Healthy /renal diet , with feeding assistance and aspiration precautions.  For Heart failure patients - Check your Weight same time everyday, if you gain over 2 pounds, or you develop in leg swelling, experience more shortness of breath or chest pain, call your Primary MD immediately. Follow Cardiac Low Salt Diet and 1.5 lit/day fluid restriction.   On your next visit with your primary care physician please Get Medicines reviewed and adjusted.   Please request your Prim.MD to go over all Hospital Tests and Procedure/Radiological results at the follow up, please get all Hospital records sent to your Prim MD by signing hospital release before you go home.   If you experience worsening of your admission symptoms, develop shortness of breath, life threatening emergency, suicidal or homicidal thoughts you must seek medical attention immediately by calling 911 or calling your MD immediately  if symptoms less severe.  You Must read complete instructions/literature along with all the possible adverse reactions/side effects  for all the Medicines you take and that have been prescribed to you. Take any new Medicines after you have completely understood and accpet all the possible adverse reactions/side effects.   Do not drive, operating heavy machinery, perform activities at heights, swimming or participation in water activities or provide baby sitting services if your were admitted for syncope or siezures until you have seen by Primary MD or a Neurologist and advised to do so again.  Do not drive when taking Pain medications.    Do not take more than prescribed Pain, Sleep  and Anxiety Medications  Special Instructions: If you have smoked or chewed Tobacco  in the last 2 yrs please stop smoking, stop any regular Alcohol  and or any Recreational drug use.  Wear Seat belts while driving.   Please note  You were cared for by a hospitalist during your hospital stay. If you have any questions about your discharge medications or the care you received while you were in the hospital after you are discharged, you can call the unit and asked to speak with the hospitalist on call if the hospitalist that took care of you is not available. Once you are discharged, your primary care physician will handle any further medical issues. Please note that NO REFILLS for any discharge medications will be authorized once you are discharged, as it is imperative that you return to your primary care physician (or establish a relationship with a primary care physician if you do not have one) for your aftercare needs so that they can reassess your need for medications and monitor your lab values.   Increase activity slowly   Complete by: As directed    No wound care   Complete by: As directed      Allergies as of 04/03/2020      Reactions   Chlorhexidine Gluconate Rash      Medication List    STOP taking these medications   clopidogrel 75 MG tablet Commonly known as: PLAVIX     TAKE these medications   acetaminophen 325 MG  tablet Commonly known as: TYLENOL Take 2 tablets (650 mg total) by mouth every 6 (six) hours as needed for mild pain (or Fever >/= 101). What changed:   medication strength  how much to take  when to take this  reasons to take this   ALPRAZolam 0.5 MG tablet Commonly known as: XANAX Take 0.5 mg by mouth daily as needed for anxiety.   amLODipine 10 MG tablet Commonly known as: NORVASC Take 1 tablet (10 mg total) by mouth daily. Start taking on: April 05, 2020 What changed: These instructions start on April 05, 2020. If you are unsure what to do until then, ask your doctor or other care provider.   aspirin EC 81 MG tablet Take 1 tablet (81 mg total) by mouth daily.   atorvastatin 80 MG tablet Commonly known as: LIPITOR Take 1 tablet (80 mg total) by mouth daily at 6 PM. What changed:   medication strength  how much to take   calcitRIOL 0.25 MCG capsule Commonly known as: ROCALTROL Take 1 capsule (0.25 mcg total) by mouth daily.   carvedilol 25 MG tablet Commonly known as: COREG Take 1 tablet (25 mg total) by mouth 2 (two) times daily with a meal.   cloNIDine 0.1 MG tablet Commonly known as: CATAPRES Take 1 tablet (0.1 mg total) by mouth 3 (three) times daily. Start taking on: April 07, 2020 What changed: These instructions start on April 07, 2020. If you are unsure what to do until then, ask your doctor or other care provider.   cyanocobalamin 1000 MCG tablet Take 1 tablet (1,000 mcg total) by mouth daily.   escitalopram 10 MG tablet Commonly known as: LEXAPRO Take 1 tablet (10 mg total) by mouth daily.   ferrous sulfate 325 (65 FE) MG tablet Take 1 tablet (325 mg total) by mouth 3 (three) times daily with meals.   furosemide 80 MG tablet Commonly known as: LASIX Take 1 tablet (80 mg total) by mouth 2 (two) times  daily. Take with 40 mg to equal 120 mg twice daily What changed: additional instructions   hydrALAZINE 100 MG tablet Commonly known  as: APRESOLINE Take 1 tablet (100 mg total) by mouth 3 (three) times daily. Start taking on: April 09, 2020 What changed: These instructions start on April 09, 2020. If you are unsure what to do until then, ask your doctor or other care provider.   Meclizine HCl 25 MG Chew Chew 25 mg by mouth 3 (three) times daily as needed.   multivitamin Tabs tablet Take 1 tablet by mouth daily.   omeprazole 20 MG capsule Commonly known as: PRILOSEC Take 20 mg by mouth daily.   pantoprazole 40 MG tablet Commonly known as: PROTONIX Take 1 tablet (40 mg total) by mouth daily.   Renagel 800 MG tablet Generic drug: sevelamer Take 3,200 mg by mouth See admin instructions. Take 3200 mg with each meal and snack   SENSIPAR PO Take 1 tablet by mouth daily.   ticagrelor 90 MG Tabs tablet Commonly known as: BRILINTA Take 1 tablet (90 mg total) by mouth 2 (two) times daily.         Diet and Activity recommendation: See Discharge Instructions above   Consults obtained -  Renal neurology   Major procedures and Radiology Reports - PLEASE review detailed and final reports for all details, in brief -      CT Angio Head W or Wo Contrast  Addendum Date: 03/31/2020   ADDENDUM REPORT: 03/31/2020 10:03 ADDENDUM: Findings discussed with Dr. Lorin Mercy at 10 a.m. via telephone. Electronically Signed   By: Margaretha Sheffield MD   On: 03/31/2020 10:03   Result Date: 03/31/2020 CLINICAL DATA:  Neuro deficit, acute stroke suspected. Left-sided weakness, slurred speech. EXAM: CT ANGIOGRAPHY HEAD AND NECK TECHNIQUE: Multidetector CT imaging of the head and neck was performed using the standard protocol during bolus administration of intravenous contrast. Multiplanar CT image reconstructions and MIPs were obtained to evaluate the vascular anatomy. Carotid stenosis measurements (when applicable) are obtained utilizing NASCET criteria, using the distal internal carotid diameter as the denominator. CONTRAST:  30mL  OMNIPAQUE IOHEXOL 350 MG/ML SOLN COMPARISON:  Same day CT head.  MR/MRA from 04/18/2017. FINDINGS: CTA NECK FINDINGS Aortic arch: Great vessel origins are patent. Right carotid system: No evidence of dissection, stenosis (50% or greater) or occlusion. Left carotid system: Mixed calcific and noncalcific atherosclerosis at the carotid bifurcation without greater than 50% narrowing. 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 Suspected severe stenosis at the C2 level Skeleton: No acute findings. Other neck: No mass or suspicious adenopathy. Upper chest: No consolidation. Mild ground-glass opacities which may relate to poor inspiration. Review of the MIP images confirms the above findings CTA HEAD FINDINGS Evaluation is limited by venous contrast contamination and motion. Within this limitation: Anterior circulation: Bilateral internal carotid arteries are patent. Suspected moderate stenosis of the distal right M1 MCA. Proximal right M2 MCAs appear patent with limited evaluation for stenosis. There is focal calcification of a proximal left M2 MCA branch, which correlates with the calcification seen on same day noncontrast head CT. The bilateral A1 and proximal A2 ACAs are patent. Evaluation for stenosis is limited given venous contamination. Posterior circulation: Poor opacification of the intradural right vertebral artery with severe stenosis of the distal V4 vertebral artery. The left intradural vertebral artery is patent. The basilar artery is patent. Bilateral PCAs are patent proximally without evidence of proximal  hemodynamically significant stenosis. More distal PCAs are not well visualized secondary to venous contamination, but appear patent. Venous sinuses: As permitted by contrast timing, patent. Review of the MIP images confirms the above findings IMPRESSION: 1. Focal calcification within a proximal left M2 MCA  branch, which correlates with the calcification seen on same day noncontrast head CT. This results in severe stenosis with opacification of the more distal branches. Differential considerations include calcified embolus (paritcularly given calcification was not evident on prior CT from 2019 and given the patient's reported speech difficulty and reported recent dialysis fistula intervention) versus progressive calcific atherosclerosis. 2. Intracranial evaluation is limited by venous contamination and motion with a suspected moderate stenosis of the distal right M1 MCA. 3. 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 Findings may be chronic given poor flow related signal of the right intradural vertebral artery on prior MRA head from 04/18/2017. 4. 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 5. Bilateral carotid bifurcation atherosclerosis without evidence of greater than 50% narrowing. Electronically Signed: By: Margaretha Sheffield MD On: 03/31/2020 09:50   CT HEAD WO CONTRAST  Result Date: 03/31/2020 CLINICAL DATA:  Neuro deficit, acute, stroke suspected; headache, classic migraine. Additional history provided: 2 prior strokes, left-sided weakness, slurred speech, right frontal headache. EXAM: CT HEAD WITHOUT CONTRAST TECHNIQUE: Contiguous axial images were obtained from the base of the skull through the vertex without intravenous contrast. COMPARISON:  Prior brain MRI 03/11/2018. FINDINGS: Brain: Redemonstrated chronic infarcts within the right frontal lobe (for instance as seen on series 3, image 20) and left parietal lobe. Background cerebral volume is normal. Mild ill-defined hypoattenuation within the cerebral white matter is nonspecific, but compatible with chronic small vessel ischemic disease. Known small chronic infarcts within the bilateral  cerebellar hemispheres were better appreciated on the prior MRI of 03/11/2018. There is no acute intracranial hemorrhage. No acute demarcated cortical infarct is identified. No extra-axial fluid collection. No evidence of intracranial mass. No midline shift. Vascular: Atherosclerotic calcifications. There is a focus of calcification in the region of the proximal M2 left middle cerebral artery (in the left sylvian fissure) (series 3, image 11). Skull: Normal. Negative for fracture or focal lesion. Sinuses/Orbits: Visualized orbits show no acute finding. Mild right ethmoid sinus mucosal thickening. ASPECTS [facility] Stroke Program Early CT Score) - Ganglionic level infarction (caudate, lentiform nuclei, internal capsule, insula, M1-M3 cortex): 7 - Supraganglionic infarction (M4-M6 cortex): 3 Total score (0-10 with 10 being normal): 10 (when discounting chronic infarcts). These results were called by telephone at the time of interpretation on 03/31/2020 at 7:45 am to provider [facility] , who verbally acknowledged these results. IMPRESSION: No evidence of acute infarct or acute intracranial hemorrhage. ASPECTS is 10 (when discounting chronic infarcts). Focus of calcification in the left sylvian fissure in the location of the proximal M2 left middle cerebral artery. This is a new finding as compared to the head CT of 03/10/2018. Differential considerations include calcified embolus versus interval development of calcified plaque at this site. Consider CTA for further evaluation (particularly given the provided history of speech difficulty). Redemonstrated chronic cortically based infarcts within the right frontal and left parietal lobes. Mild cerebral white matter chronic small vessel ischemic disease. Electronically Signed   By: Kellie Simmering DO   On: 03/31/2020 07:58   CT Angio Neck W and/or Wo Contrast  Addendum Date: 03/31/2020   ADDENDUM REPORT:  03/31/2020 10:03 ADDENDUM: Findings discussed with Dr. Lorin Mercy at  10 a.m. via telephone. Electronically Signed   By: Margaretha Sheffield MD   On: 03/31/2020 10:03   Result Date: 03/31/2020 CLINICAL DATA:  Neuro deficit, acute stroke suspected. Left-sided weakness, slurred speech. EXAM: CT ANGIOGRAPHY HEAD AND NECK TECHNIQUE: Multidetector CT imaging of the head and neck was performed using the standard protocol during bolus administration of intravenous contrast. Multiplanar CT image reconstructions and MIPs were obtained to evaluate the vascular anatomy. Carotid stenosis measurements (when applicable) are obtained utilizing NASCET criteria, using the distal internal carotid diameter as the denominator. CONTRAST:  24mL OMNIPAQUE IOHEXOL 350 MG/ML SOLN COMPARISON:  Same day CT head.  MR/MRA from 04/18/2017. FINDINGS: CTA NECK FINDINGS Aortic arch: Great vessel origins are patent. Right carotid system: No evidence of dissection, stenosis (50% or greater) or occlusion. Left carotid system: Mixed calcific and noncalcific atherosclerosis at the carotid bifurcation without greater than 50% narrowing. 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 Suspected severe stenosis at the C2 level Skeleton: No acute findings. Other neck: No mass or suspicious adenopathy. Upper chest: No consolidation. Mild ground-glass opacities which may relate to poor inspiration. Review of the MIP images confirms the above findings CTA HEAD FINDINGS Evaluation is limited by venous contrast contamination and motion. Within this limitation: Anterior circulation: Bilateral internal carotid arteries are patent. Suspected moderate stenosis of the distal right M1 MCA. Proximal right M2 MCAs appear patent with limited evaluation for stenosis. There is focal calcification of a proximal left M2 MCA branch, which correlates with the calcification seen on same day noncontrast head CT. The bilateral A1 and proximal A2 ACAs are  patent. Evaluation for stenosis is limited given venous contamination. Posterior circulation: Poor opacification of the intradural right vertebral artery with severe stenosis of the distal V4 vertebral artery. The left intradural vertebral artery is patent. The basilar artery is patent. Bilateral PCAs are patent proximally without evidence of proximal hemodynamically significant stenosis. More distal PCAs are not well visualized secondary to venous contamination, but appear patent. Venous sinuses: As permitted by contrast timing, patent. Review of the MIP images confirms the above findings IMPRESSION: 1. Focal calcification within a proximal left M2 MCA branch, which correlates with the calcification seen on same day noncontrast head CT. This results in severe stenosis with opacification of the more distal branches. Differential considerations include calcified embolus (paritcularly given calcification was not evident on prior CT from 2019 and given the patient's reported speech difficulty and reported recent dialysis fistula intervention) versus progressive calcific atherosclerosis. 2. Intracranial evaluation is limited by venous contamination and motion with a suspected moderate stenosis of the distal right M1 MCA. 3. 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 Findings may be chronic given poor flow related signal of the right intradural vertebral artery on prior MRA head from 04/18/2017. 4. 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 5. Bilateral carotid bifurcation atherosclerosis without evidence of greater than 50% narrowing. Electronically Signed: By: Margaretha Sheffield MD On: 03/31/2020 09:50   MR BRAIN WO CONTRAST  Result Date: 04/01/2020 CLINICAL DATA:  Acute neuro deficit with stroke suspected EXAM: MRI HEAD WITHOUT CONTRAST TECHNIQUE:  Multiplanar, multiecho pulse sequences of the brain and surrounding structures were obtained without intravenous contrast. COMPARISON:  Head CT and CTA from yesterday.  Brain  MRI 03/11/2018 FINDINGS: Brain: Small acute cortical infarct along the lateral left frontal lobe, MCA distribution. Moderate to large remote left parietal infarct which is cortically based. Small remote superior right frontal infarct. No acute hemorrhage, hydrocephalus, or masslike finding. Chronic blood products at sites of prior infarction. Susceptibility along the low left sylvian fissure correlating with calcification on prior CTA. Vascular: Normal flow voids Skull and upper cervical spine: Normal marrow signal Sinuses/Orbits: Negative IMPRESSION: 1. Small acute infarct along the left frontal cortex. 2. 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 Electronically Signed   By: Monte Fantasia M.D.   On: 04/01/2020 09:49   DG Chest Portable 1 View  Result Date: 03/31/2020 CLINICAL DATA:  Stroke-like symptoms EXAM: PORTABLE CHEST 1 VIEW COMPARISON:  03/09/2018 FINDINGS: Cardiomegaly with stable mediastinal contours. Implantable loop recorder. There is no edema, consolidation, effusion, or pneumothorax. Artifact from EKG leads IMPRESSION: 1. No acute finding or change from 2019. 2. Cardiomegaly. Electronically Signed   By: Monte Fantasia M.D.   On: 03/31/2020 07:02   ECHOCARDIOGRAM COMPLETE  Result Date: 04/02/2020    ECHOCARDIOGRAM REPORT   Patient Name:   ROSHNI BURBANO Date of Exam: 04/02/2020 Medical Rec #:  [id number]        Height:       68.0 in Accession #:    [id number]       Weight:       248.3 lb Date of Birth:  1965/10/02        BSA:          2.241 m Patient Age:    34 years         BP:           160/77 mmHg Patient Gender: F                HR:           71 bpm. Exam Location:  Inpatient Procedure: 2D Echo, Cardiac Doppler and Color Doppler Indications:    Stroke  History:        Patient has prior history of  Echocardiogram examinations, most                 recent 03/10/2018. Covid 19 positive.  Sonographer:    Merrie Roof RDCS Referring Phys: [place]  1. When compared to the prior study from 03/10/2018 aortc valve is now thicker with a possible mobile echodensity attached to the left coronary cusp. A TEE is recommended for further evaluation.  2. Left ventricular ejection fraction, by estimation, is 65 to 70%. The left ventricle has normal function. The left ventricle has no regional wall motion abnormalities. Left ventricular diastolic parameters are consistent with Grade II diastolic dysfunction (pseudonormalization). Elevated left atrial pressure.  3. Right ventricular systolic function is normal. The right ventricular size is normal. There is mildly elevated pulmonary artery systolic pressure.  4. Left atrial size was mildly dilated.  5. The mitral valve is normal in structure. No evidence of mitral valve regurgitation. No evidence of mitral stenosis.  6. The aortic valve is normal in structure. There is mild calcification of the aortic valve. There is severe thickening of the aortic valve. Aortic valve regurgitation is mild. No aortic stenosis is present.  7. The inferior vena cava is normal in size with greater than 50% respiratory variability, suggesting right atrial pressure of 3 mmHg. FINDINGS  Left Ventricle: Left ventricular ejection fraction, by estimation, is 65 to 70%. The left  ventricle has normal function. The left ventricle has no regional wall motion abnormalities. The left ventricular internal cavity size was normal in size. There is  no left ventricular hypertrophy. Left ventricular diastolic parameters are consistent with Grade II diastolic dysfunction (pseudonormalization). Elevated left atrial pressure. Right Ventricle: The right ventricular size is normal. No increase in right ventricular wall thickness. Right ventricular systolic function is normal. There is mildly  elevated pulmonary artery systolic pressure. The tricuspid regurgitant velocity is 2.95  m/s, and with an assumed right atrial pressure of 3 mmHg, the estimated right ventricular systolic pressure is 57.3 mmHg. Left Atrium: Left atrial size was mildly dilated. Right Atrium: Right atrial size was normal in size. Pericardium: There is no evidence of pericardial effusion. Mitral Valve: The mitral valve is normal in structure. No evidence of mitral valve regurgitation. No evidence of mitral valve stenosis. Tricuspid Valve: The tricuspid valve is normal in structure. Tricuspid valve regurgitation is not demonstrated. No evidence of tricuspid stenosis. Aortic Valve: The aortic valve is normal in structure. There is mild calcification of the aortic valve. There is severe thickening of the aortic valve. Aortic valve regurgitation is mild. No aortic stenosis is present. Aortic valve mean gradient measures  18.0 mmHg. Aortic valve peak gradient measures 33.2 mmHg. Aortic valve area, by VTI measures 1.82 cm. Pulmonic Valve: The pulmonic valve was normal in structure. Pulmonic valve regurgitation is trivial. No evidence of pulmonic stenosis. Aorta: The aortic root is normal in size and structure. Venous: The inferior vena cava is normal in size with greater than 50% respiratory variability, suggesting right atrial pressure of 3 mmHg. IAS/Shunts: No atrial level shunt detected by color flow Doppler.  LEFT VENTRICLE PLAX 2D LVIDd:         5.50 cm LVIDs:         3.50 cm LV PW:         1.20 cm LV IVS:        1.10 cm LVOT diam:     2.00 cm LV SV:         106 LV SV Index:   47 LVOT Area:     3.14 cm  LV Volumes (MOD) LV vol d, MOD A4C: 143.0 ml LV vol s, MOD A4C: 36.8 ml LV SV MOD A4C:     143.0 ml RIGHT VENTRICLE RV Basal diam:  3.70 cm LEFT ATRIUM             Index       RIGHT ATRIUM           Index LA diam:        5.60 cm 2.50 cm/m  RA Area:     19.80 cm LA Vol (A2C):   52.0 ml 23.21 ml/m RA Volume:   56.50 ml  25.22 ml/m LA  Vol (A4C):   77.6 ml 34.63 ml/m LA Biplane Vol: 63.9 ml 28.52 ml/m  AORTIC VALVE AV Area (Vmax):    1.65 cm AV Area (Vmean):   1.63 cm AV Area (VTI):     1.82 cm AV Vmax:           288.00 cm/s AV Vmean:          201.000 cm/s AV VTI:            0.582 m AV Peak Grad:      33.2 mmHg AV Mean Grad:      18.0 mmHg LVOT Vmax:         151.00 cm/s LVOT  Vmean:        104.000 cm/s LVOT VTI:          0.337 m LVOT/AV VTI ratio: 0.58  AORTA Ao Root diam: 2.70 cm Ao Asc diam:  3.80 cm TRICUSPID VALVE TR Peak grad:   34.8 mmHg TR Vmax:        295.00 cm/s  SHUNTS Systemic VTI:  0.34 m Systemic Diam: 2.00 cm Ena Dawley MD Electronically signed by Ena Dawley MD Signature Date/Time: 04/02/2020/12:29:17 PM    Final    CT HEAD CODE STROKE WO CONTRAST  Result Date: 04/01/2020 CLINICAL DATA:  Code stroke. Right-sided weakness and inability to speak EXAM: CT HEAD WITHOUT CONTRAST TECHNIQUE: Contiguous axial images were obtained from the base of the skull through the vertex without intravenous contrast. COMPARISON:  Brain MRI from earlier today FINDINGS: Brain: Small acute left frontal cortex infarct by preceding MRI. Remote left parietal and superior right frontal infarcts. No hemorrhage, hydrocephalus, or collection. Vascular: High-density vessels from recent CTA. Presumed calcified embolism is again seen at the low left sylvian fissure. Skull: Negative Sinuses/Orbits: Negative ASPECTS ([place] Stroke Program Early CT Score) Not scored in this setting IMPRESSION: 1. Small acute infarct in the left frontal lobe by brain MRI 2 hours prior. No acute hemorrhage. 2. Remote left parietal and right frontal infarcts. 3. Presumed calcified embolism in the low left sylvian fissure. Electronically Signed   By: Monte Fantasia M.D.   On: 04/01/2020 10:57    Micro Results     Recent Results (from the past 240 hour(s))  Resp Panel by RT-PCR (Flu A&B, Covid) Nasopharyngeal Swab     Status: Abnormal   Collection Time: 03/31/20   7:51 AM   Specimen: Nasopharyngeal Swab; Nasopharyngeal(NP) swabs in vial transport medium  Result Value Ref Range Status   SARS Coronavirus 2 by RT PCR POSITIVE (A) NEGATIVE Final    Comment: emailed L. Berdik RN 8:55 03/31/20 ([email or web address]) (NOTE) SARS-CoV-2 target nucleic acids are DETECTED.  The SARS-CoV-2 RNA is generally detectable in upper respiratory specimens during the acute phase of infection. Positive results are indicative of the presence of the identified virus, but do not rule out bacterial infection or co-infection with other pathogens not detected by the test. Clinical correlation with patient history and other diagnostic information is necessary to determine patient infection status. The expected result is Negative.  Fact Sheet for Patients: EntrepreneurPulse.com.au  Fact Sheet for Healthcare Providers: IncredibleEmployment.be  This test is not yet approved or cleared by the [place] FDA and  has been authorized for detection and/or diagnosis of SARS-CoV-2 by FDA under an Emergency Use Authorization (EUA).  This EUA will remain in effect (meaning this test can be used) for the duration of  the COVID-1 9 declaration under Section 564(b)(1) of the Act, 21 U.S.C. section 360bbb-3(b)(1), unless the authorization is terminated or revoked sooner.     Influenza A by PCR NEGATIVE NEGATIVE Final   Influenza B by PCR NEGATIVE NEGATIVE Final    Comment: (NOTE) The Xpert Xpress SARS-CoV-2/FLU/RSV plus assay is intended as an aid in the diagnosis of influenza from Nasopharyngeal swab specimens and should not be used as a sole basis for treatment. Nasal washings and aspirates are unacceptable for Xpert Xpress SARS-CoV-2/FLU/RSV testing.  Fact Sheet for Patients: EntrepreneurPulse.com.au  Fact Sheet for Healthcare Providers: IncredibleEmployment.be  This test is not yet approved or cleared by the  [place] FDA and has been authorized for detection and/or diagnosis of SARS-CoV-2 by FDA under an  Emergency Use Authorization (EUA). This EUA will remain in effect (meaning this test can be used) for the duration of the COVID-19 declaration under Section 564(b)(1) of the Act, 21 U.S.C. section 360bbb-3(b)(1), unless the authorization is terminated or revoked.  Performed at [facility], [place] [address], [place], [place] [address]   Resp Panel by RT-PCR (Flu A&B, Covid)     Status: Abnormal   Collection Time: 03/31/20  9:34 AM  Result Value Ref Range Status   SARS Coronavirus 2 by RT PCR POSITIVE (A) NEGATIVE Final    Comment: CRITICAL VALUE NOTED.  VALUE IS CONSISTENT WITH PREVIOUSLY REPORTED AND CALLED VALUE. (NOTE) SARS-CoV-2 target nucleic acids are DETECTED.  The SARS-CoV-2 RNA is generally detectable in upper respiratory specimens during the acute phase of infection. Positive results are indicative of the presence of the identified virus, but do not rule out bacterial infection or co-infection with other pathogens not detected by the test. Clinical correlation with patient history and other diagnostic information is necessary to determine patient infection status. The expected result is Negative.  Fact Sheet for Patients: EntrepreneurPulse.com.au  Fact Sheet for Healthcare Providers: IncredibleEmployment.be  This test is not yet approved or cleared by the [place] FDA and  has been authorized for detection and/or diagnosis of SARS-CoV-2 by FDA under an Emergency Use Authorization (EUA).  This EUA will remain in effect (meaning this test can b e used) for the duration of  the COVID-19 declaration under Section 564(b)(1) of the Act, 21 U.S.C. section 360bbb-3(b)(1), unless the authorization is terminated or revoked sooner.     Influenza A by PCR NEGATIVE NEGATIVE Final   Influenza B by PCR NEGATIVE NEGATIVE Final     Comment: (NOTE) The Xpert Xpress SARS-CoV-2/FLU/RSV plus assay is intended as an aid in the diagnosis of influenza from Nasopharyngeal swab specimens and should not be used as a sole basis for treatment. Nasal washings and aspirates are unacceptable for Xpert Xpress SARS-CoV-2/FLU/RSV testing.  Fact Sheet for Patients: EntrepreneurPulse.com.au  Fact Sheet for Healthcare Providers: IncredibleEmployment.be  This test is not yet approved or cleared by the [place] FDA and has been authorized for detection and/or diagnosis of SARS-CoV-2 by FDA under an Emergency Use Authorization (EUA). This EUA will remain in effect (meaning this test can be used) for the duration of the COVID-19 declaration under Section 564(b)(1) of the Act, 21 U.S.C. section 360bbb-3(b)(1), unless the authorization is terminated or revoked.  Performed at [facility], [place] [address], [place],  [address]        Today   Subjective:   Redonna Wilbert today has no headache,no chest abdominal pain,no new weakness tingling or numbness, feels much better wants to go home today.   Objective:   Blood pressure (!) 141/73, pulse 67, temperature 98.3 F (36.8 C), temperature source Oral, resp. rate 18, height 5\' 8"  (1.727 m), weight 113.9 kg, last menstrual period 08/12/2017, SpO2 100 %.   Intake/Output Summary (Last 24 hours) at 04/03/2020 1330 Last data filed at 04/03/2020 0907 Gross per 24 hour  Intake 240 ml  Output 1 ml  Net 239 ml    Exam Awake Alert, Oriented x 3, No new F.N deficits, Normal affect Symmetrical Chest wall movement, Good air movement bilaterally, CTAB RRR,No Gallops,Rubs or new Murmurs, No Parasternal Heave +ve B.Sounds, Abd Soft, Non tender, No rebound -guarding or rigidity. No Cyanosis, Clubbing or edema, No new Rash or bruise  Data Review   CBC w Diff:  Lab Results  Component Value Date   WBC 14.0 (H) 04/03/2020   HGB 9.8  (L) 04/03/2020   HCT 32.2 (L) 04/03/2020   PLT 226 04/03/2020   LYMPHOPCT 20 04/03/2020   MONOPCT 11 04/03/2020   EOSPCT 1 04/03/2020   BASOPCT 0 04/03/2020    CMP:  Lab Results  Component Value Date   NA 139 04/03/2020   K 4.7 04/03/2020   CL 96 (L) 04/03/2020   CO2 26 04/03/2020   BUN 50 (H) 04/03/2020   CREATININE 10.25 (H) 04/03/2020   CREATININE 2.25 (H) 12/23/2015   PROT 7.9 04/03/2020   ALBUMIN 3.1 (L) 04/03/2020   BILITOT 0.6 04/03/2020   ALKPHOS 65 04/03/2020   AST 23 04/03/2020   ALT 14 04/03/2020  .   Total Time in preparing paper work, data evaluation and todays exam - 45 minutes  Phillips Climes M.D on 04/03/2020 at 1:30 PM  [facility]   Office  [phone]

## 2020-04-04 ENCOUNTER — Telehealth (HOSPITAL_COMMUNITY): Payer: Self-pay | Admitting: Nephrology

## 2020-04-04 NOTE — Telephone Encounter (Signed)
Transition of care contact from inpatient facility  Date of discharge: 04/03/20 Date of contact:  04/04/20 Method: Phone Spoke to: Patient  Patient contacted to discuss transition of care from recent inpatient hospitalization. Patient was admitted to [facility] from 12/23 - 04/03/20 with discharge diagnosis of acute CVA and breakthrough COVID infection.  Medication changes were reviewed. Brillinta added - her son will pick it up today.  She has no persistent CVA symptoms and no COVID symptoms aside from fatigue.  Patient will follow up with his/her outpatient HD unit on: tomorrow - will be on TTS 3rd isolation shift. Called her HD unit who will call the patient directly with her on-time and the procedure for isolation shift.  Veneta Penton, PA-C [employer] Pager [phone]

## 2020-04-05 LAB — IMMUNOFIXATION ELECTROPHORESIS
IgA: 471 mg/dL — ABNORMAL HIGH (ref 87–352)
IgG (Immunoglobin G), Serum: 2248 mg/dL — ABNORMAL HIGH (ref 586–1602)
IgM (Immunoglobulin M), Srm: 118 mg/dL (ref 26–217)
Total Protein ELP: 8.5 g/dL (ref 6.0–8.5)

## 2020-04-05 LAB — PROTEIN ELECTROPHORESIS, SERUM
A/G Ratio: 0.8 (ref 0.7–1.7)
Albumin ELP: 3.5 g/dL (ref 2.9–4.4)
Alpha-1-Globulin: 0.3 g/dL (ref 0.0–0.4)
Alpha-2-Globulin: 0.9 g/dL (ref 0.4–1.0)
Beta Globulin: 1.1 g/dL (ref 0.7–1.3)
Gamma Globulin: 2.3 g/dL — ABNORMAL HIGH (ref 0.4–1.8)
Globulin, Total: 4.6 g/dL — ABNORMAL HIGH (ref 2.2–3.9)
Total Protein ELP: 8.1 g/dL (ref 6.0–8.5)

## 2020-04-07 LAB — MTHFR DNA ANALYSIS

## 2020-04-12 DIAGNOSIS — N2581 Secondary hyperparathyroidism of renal origin: Secondary | ICD-10-CM | POA: Diagnosis not present

## 2020-04-12 DIAGNOSIS — Z1152 Encounter for screening for COVID-19: Secondary | ICD-10-CM | POA: Diagnosis not present

## 2020-04-12 DIAGNOSIS — D631 Anemia in chronic kidney disease: Secondary | ICD-10-CM | POA: Diagnosis not present

## 2020-04-12 DIAGNOSIS — U071 COVID-19: Secondary | ICD-10-CM | POA: Diagnosis not present

## 2020-04-12 DIAGNOSIS — E213 Hyperparathyroidism, unspecified: Secondary | ICD-10-CM | POA: Diagnosis not present

## 2020-04-12 DIAGNOSIS — N186 End stage renal disease: Secondary | ICD-10-CM | POA: Diagnosis not present

## 2020-04-12 DIAGNOSIS — Z992 Dependence on renal dialysis: Secondary | ICD-10-CM | POA: Diagnosis not present

## 2020-04-14 DIAGNOSIS — E213 Hyperparathyroidism, unspecified: Secondary | ICD-10-CM | POA: Diagnosis not present

## 2020-04-14 DIAGNOSIS — Z992 Dependence on renal dialysis: Secondary | ICD-10-CM | POA: Diagnosis not present

## 2020-04-14 DIAGNOSIS — N2581 Secondary hyperparathyroidism of renal origin: Secondary | ICD-10-CM | POA: Diagnosis not present

## 2020-04-14 DIAGNOSIS — U071 COVID-19: Secondary | ICD-10-CM | POA: Diagnosis not present

## 2020-04-14 DIAGNOSIS — Z1152 Encounter for screening for COVID-19: Secondary | ICD-10-CM | POA: Diagnosis not present

## 2020-04-14 DIAGNOSIS — D631 Anemia in chronic kidney disease: Secondary | ICD-10-CM | POA: Diagnosis not present

## 2020-04-14 DIAGNOSIS — N186 End stage renal disease: Secondary | ICD-10-CM | POA: Diagnosis not present

## 2020-04-18 DIAGNOSIS — N186 End stage renal disease: Secondary | ICD-10-CM | POA: Diagnosis not present

## 2020-04-18 DIAGNOSIS — M79602 Pain in left arm: Secondary | ICD-10-CM | POA: Diagnosis not present

## 2020-04-18 DIAGNOSIS — Z992 Dependence on renal dialysis: Secondary | ICD-10-CM | POA: Diagnosis not present

## 2020-04-19 DIAGNOSIS — N186 End stage renal disease: Secondary | ICD-10-CM | POA: Diagnosis not present

## 2020-04-19 DIAGNOSIS — N2581 Secondary hyperparathyroidism of renal origin: Secondary | ICD-10-CM | POA: Diagnosis not present

## 2020-04-19 DIAGNOSIS — E213 Hyperparathyroidism, unspecified: Secondary | ICD-10-CM | POA: Diagnosis not present

## 2020-04-19 DIAGNOSIS — D631 Anemia in chronic kidney disease: Secondary | ICD-10-CM | POA: Diagnosis not present

## 2020-04-19 DIAGNOSIS — Z992 Dependence on renal dialysis: Secondary | ICD-10-CM | POA: Diagnosis not present

## 2020-04-21 DIAGNOSIS — Z992 Dependence on renal dialysis: Secondary | ICD-10-CM | POA: Diagnosis not present

## 2020-04-21 DIAGNOSIS — E213 Hyperparathyroidism, unspecified: Secondary | ICD-10-CM | POA: Diagnosis not present

## 2020-04-21 DIAGNOSIS — D631 Anemia in chronic kidney disease: Secondary | ICD-10-CM | POA: Diagnosis not present

## 2020-04-21 DIAGNOSIS — N2581 Secondary hyperparathyroidism of renal origin: Secondary | ICD-10-CM | POA: Diagnosis not present

## 2020-04-21 DIAGNOSIS — N186 End stage renal disease: Secondary | ICD-10-CM | POA: Diagnosis not present

## 2020-04-23 DIAGNOSIS — E213 Hyperparathyroidism, unspecified: Secondary | ICD-10-CM | POA: Diagnosis not present

## 2020-04-23 DIAGNOSIS — D631 Anemia in chronic kidney disease: Secondary | ICD-10-CM | POA: Diagnosis not present

## 2020-04-23 DIAGNOSIS — Z992 Dependence on renal dialysis: Secondary | ICD-10-CM | POA: Diagnosis not present

## 2020-04-23 DIAGNOSIS — N2581 Secondary hyperparathyroidism of renal origin: Secondary | ICD-10-CM | POA: Diagnosis not present

## 2020-04-23 DIAGNOSIS — N186 End stage renal disease: Secondary | ICD-10-CM | POA: Diagnosis not present

## 2020-04-28 DIAGNOSIS — D631 Anemia in chronic kidney disease: Secondary | ICD-10-CM | POA: Diagnosis not present

## 2020-04-28 DIAGNOSIS — E213 Hyperparathyroidism, unspecified: Secondary | ICD-10-CM | POA: Diagnosis not present

## 2020-04-28 DIAGNOSIS — N186 End stage renal disease: Secondary | ICD-10-CM | POA: Diagnosis not present

## 2020-04-28 DIAGNOSIS — N2581 Secondary hyperparathyroidism of renal origin: Secondary | ICD-10-CM | POA: Diagnosis not present

## 2020-04-28 DIAGNOSIS — Z992 Dependence on renal dialysis: Secondary | ICD-10-CM | POA: Diagnosis not present

## 2020-05-03 DIAGNOSIS — E213 Hyperparathyroidism, unspecified: Secondary | ICD-10-CM | POA: Diagnosis not present

## 2020-05-03 DIAGNOSIS — Z992 Dependence on renal dialysis: Secondary | ICD-10-CM | POA: Diagnosis not present

## 2020-05-03 DIAGNOSIS — N2581 Secondary hyperparathyroidism of renal origin: Secondary | ICD-10-CM | POA: Diagnosis not present

## 2020-05-03 DIAGNOSIS — D631 Anemia in chronic kidney disease: Secondary | ICD-10-CM | POA: Diagnosis not present

## 2020-05-03 DIAGNOSIS — N186 End stage renal disease: Secondary | ICD-10-CM | POA: Diagnosis not present

## 2020-05-05 DIAGNOSIS — Z992 Dependence on renal dialysis: Secondary | ICD-10-CM | POA: Diagnosis not present

## 2020-05-05 DIAGNOSIS — E213 Hyperparathyroidism, unspecified: Secondary | ICD-10-CM | POA: Diagnosis not present

## 2020-05-05 DIAGNOSIS — D631 Anemia in chronic kidney disease: Secondary | ICD-10-CM | POA: Diagnosis not present

## 2020-05-05 DIAGNOSIS — N186 End stage renal disease: Secondary | ICD-10-CM | POA: Diagnosis not present

## 2020-05-05 DIAGNOSIS — N2581 Secondary hyperparathyroidism of renal origin: Secondary | ICD-10-CM | POA: Diagnosis not present

## 2020-05-09 ENCOUNTER — Institutional Professional Consult (permissible substitution): Payer: Medicare Other | Admitting: Diagnostic Neuroimaging

## 2020-05-09 DIAGNOSIS — Z992 Dependence on renal dialysis: Secondary | ICD-10-CM | POA: Diagnosis not present

## 2020-05-09 DIAGNOSIS — N186 End stage renal disease: Secondary | ICD-10-CM | POA: Diagnosis not present

## 2020-05-09 DIAGNOSIS — I129 Hypertensive chronic kidney disease with stage 1 through stage 4 chronic kidney disease, or unspecified chronic kidney disease: Secondary | ICD-10-CM | POA: Diagnosis not present

## 2020-05-10 DIAGNOSIS — D631 Anemia in chronic kidney disease: Secondary | ICD-10-CM | POA: Diagnosis not present

## 2020-05-10 DIAGNOSIS — R519 Headache, unspecified: Secondary | ICD-10-CM | POA: Diagnosis not present

## 2020-05-10 DIAGNOSIS — R52 Pain, unspecified: Secondary | ICD-10-CM | POA: Diagnosis not present

## 2020-05-10 DIAGNOSIS — E213 Hyperparathyroidism, unspecified: Secondary | ICD-10-CM | POA: Diagnosis not present

## 2020-05-10 DIAGNOSIS — N2581 Secondary hyperparathyroidism of renal origin: Secondary | ICD-10-CM | POA: Diagnosis not present

## 2020-05-10 DIAGNOSIS — N186 End stage renal disease: Secondary | ICD-10-CM | POA: Diagnosis not present

## 2020-05-10 DIAGNOSIS — Z992 Dependence on renal dialysis: Secondary | ICD-10-CM | POA: Diagnosis not present

## 2020-05-12 DIAGNOSIS — E213 Hyperparathyroidism, unspecified: Secondary | ICD-10-CM | POA: Diagnosis not present

## 2020-05-12 DIAGNOSIS — R52 Pain, unspecified: Secondary | ICD-10-CM | POA: Diagnosis not present

## 2020-05-12 DIAGNOSIS — N186 End stage renal disease: Secondary | ICD-10-CM | POA: Diagnosis not present

## 2020-05-12 DIAGNOSIS — N2581 Secondary hyperparathyroidism of renal origin: Secondary | ICD-10-CM | POA: Diagnosis not present

## 2020-05-12 DIAGNOSIS — R519 Headache, unspecified: Secondary | ICD-10-CM | POA: Diagnosis not present

## 2020-05-12 DIAGNOSIS — Z992 Dependence on renal dialysis: Secondary | ICD-10-CM | POA: Diagnosis not present

## 2020-05-12 DIAGNOSIS — D631 Anemia in chronic kidney disease: Secondary | ICD-10-CM | POA: Diagnosis not present

## 2020-05-17 DIAGNOSIS — D631 Anemia in chronic kidney disease: Secondary | ICD-10-CM | POA: Diagnosis not present

## 2020-05-17 DIAGNOSIS — Z992 Dependence on renal dialysis: Secondary | ICD-10-CM | POA: Diagnosis not present

## 2020-05-17 DIAGNOSIS — N186 End stage renal disease: Secondary | ICD-10-CM | POA: Diagnosis not present

## 2020-05-17 DIAGNOSIS — N2581 Secondary hyperparathyroidism of renal origin: Secondary | ICD-10-CM | POA: Diagnosis not present

## 2020-05-17 DIAGNOSIS — E213 Hyperparathyroidism, unspecified: Secondary | ICD-10-CM | POA: Diagnosis not present

## 2020-05-17 DIAGNOSIS — R519 Headache, unspecified: Secondary | ICD-10-CM | POA: Diagnosis not present

## 2020-05-17 DIAGNOSIS — R52 Pain, unspecified: Secondary | ICD-10-CM | POA: Diagnosis not present

## 2020-05-19 DIAGNOSIS — E213 Hyperparathyroidism, unspecified: Secondary | ICD-10-CM | POA: Diagnosis not present

## 2020-05-19 DIAGNOSIS — R52 Pain, unspecified: Secondary | ICD-10-CM | POA: Diagnosis not present

## 2020-05-19 DIAGNOSIS — R519 Headache, unspecified: Secondary | ICD-10-CM | POA: Diagnosis not present

## 2020-05-19 DIAGNOSIS — N186 End stage renal disease: Secondary | ICD-10-CM | POA: Diagnosis not present

## 2020-05-19 DIAGNOSIS — N2581 Secondary hyperparathyroidism of renal origin: Secondary | ICD-10-CM | POA: Diagnosis not present

## 2020-05-19 DIAGNOSIS — D631 Anemia in chronic kidney disease: Secondary | ICD-10-CM | POA: Diagnosis not present

## 2020-05-19 DIAGNOSIS — Z992 Dependence on renal dialysis: Secondary | ICD-10-CM | POA: Diagnosis not present

## 2020-05-20 ENCOUNTER — Ambulatory Visit: Payer: Medicare Other | Admitting: Cardiovascular Disease

## 2020-05-21 DIAGNOSIS — D631 Anemia in chronic kidney disease: Secondary | ICD-10-CM | POA: Diagnosis not present

## 2020-05-21 DIAGNOSIS — N2581 Secondary hyperparathyroidism of renal origin: Secondary | ICD-10-CM | POA: Diagnosis not present

## 2020-05-21 DIAGNOSIS — R519 Headache, unspecified: Secondary | ICD-10-CM | POA: Diagnosis not present

## 2020-05-21 DIAGNOSIS — R52 Pain, unspecified: Secondary | ICD-10-CM | POA: Diagnosis not present

## 2020-05-21 DIAGNOSIS — E213 Hyperparathyroidism, unspecified: Secondary | ICD-10-CM | POA: Diagnosis not present

## 2020-05-21 DIAGNOSIS — N186 End stage renal disease: Secondary | ICD-10-CM | POA: Diagnosis not present

## 2020-05-21 DIAGNOSIS — Z992 Dependence on renal dialysis: Secondary | ICD-10-CM | POA: Diagnosis not present

## 2020-05-24 DIAGNOSIS — N186 End stage renal disease: Secondary | ICD-10-CM | POA: Diagnosis not present

## 2020-05-24 DIAGNOSIS — N2581 Secondary hyperparathyroidism of renal origin: Secondary | ICD-10-CM | POA: Diagnosis not present

## 2020-05-24 DIAGNOSIS — Z992 Dependence on renal dialysis: Secondary | ICD-10-CM | POA: Diagnosis not present

## 2020-05-24 DIAGNOSIS — R52 Pain, unspecified: Secondary | ICD-10-CM | POA: Diagnosis not present

## 2020-05-24 DIAGNOSIS — R519 Headache, unspecified: Secondary | ICD-10-CM | POA: Diagnosis not present

## 2020-05-24 DIAGNOSIS — D631 Anemia in chronic kidney disease: Secondary | ICD-10-CM | POA: Diagnosis not present

## 2020-05-24 DIAGNOSIS — E213 Hyperparathyroidism, unspecified: Secondary | ICD-10-CM | POA: Diagnosis not present

## 2020-05-26 DIAGNOSIS — Z992 Dependence on renal dialysis: Secondary | ICD-10-CM | POA: Diagnosis not present

## 2020-05-26 DIAGNOSIS — D631 Anemia in chronic kidney disease: Secondary | ICD-10-CM | POA: Diagnosis not present

## 2020-05-26 DIAGNOSIS — E213 Hyperparathyroidism, unspecified: Secondary | ICD-10-CM | POA: Diagnosis not present

## 2020-05-26 DIAGNOSIS — N186 End stage renal disease: Secondary | ICD-10-CM | POA: Diagnosis not present

## 2020-05-26 DIAGNOSIS — R519 Headache, unspecified: Secondary | ICD-10-CM | POA: Diagnosis not present

## 2020-05-26 DIAGNOSIS — R52 Pain, unspecified: Secondary | ICD-10-CM | POA: Diagnosis not present

## 2020-05-26 DIAGNOSIS — N2581 Secondary hyperparathyroidism of renal origin: Secondary | ICD-10-CM | POA: Diagnosis not present

## 2020-05-28 DIAGNOSIS — Z992 Dependence on renal dialysis: Secondary | ICD-10-CM | POA: Diagnosis not present

## 2020-05-28 DIAGNOSIS — N186 End stage renal disease: Secondary | ICD-10-CM | POA: Diagnosis not present

## 2020-05-28 DIAGNOSIS — D631 Anemia in chronic kidney disease: Secondary | ICD-10-CM | POA: Diagnosis not present

## 2020-05-28 DIAGNOSIS — R52 Pain, unspecified: Secondary | ICD-10-CM | POA: Diagnosis not present

## 2020-05-28 DIAGNOSIS — R519 Headache, unspecified: Secondary | ICD-10-CM | POA: Diagnosis not present

## 2020-05-28 DIAGNOSIS — E213 Hyperparathyroidism, unspecified: Secondary | ICD-10-CM | POA: Diagnosis not present

## 2020-05-28 DIAGNOSIS — N2581 Secondary hyperparathyroidism of renal origin: Secondary | ICD-10-CM | POA: Diagnosis not present

## 2020-05-31 DIAGNOSIS — N186 End stage renal disease: Secondary | ICD-10-CM | POA: Diagnosis not present

## 2020-05-31 DIAGNOSIS — R52 Pain, unspecified: Secondary | ICD-10-CM | POA: Diagnosis not present

## 2020-05-31 DIAGNOSIS — D631 Anemia in chronic kidney disease: Secondary | ICD-10-CM | POA: Diagnosis not present

## 2020-05-31 DIAGNOSIS — R519 Headache, unspecified: Secondary | ICD-10-CM | POA: Diagnosis not present

## 2020-05-31 DIAGNOSIS — Z992 Dependence on renal dialysis: Secondary | ICD-10-CM | POA: Diagnosis not present

## 2020-05-31 DIAGNOSIS — N2581 Secondary hyperparathyroidism of renal origin: Secondary | ICD-10-CM | POA: Diagnosis not present

## 2020-05-31 DIAGNOSIS — E213 Hyperparathyroidism, unspecified: Secondary | ICD-10-CM | POA: Diagnosis not present

## 2020-06-02 DIAGNOSIS — Z992 Dependence on renal dialysis: Secondary | ICD-10-CM | POA: Diagnosis not present

## 2020-06-02 DIAGNOSIS — E213 Hyperparathyroidism, unspecified: Secondary | ICD-10-CM | POA: Diagnosis not present

## 2020-06-02 DIAGNOSIS — N2581 Secondary hyperparathyroidism of renal origin: Secondary | ICD-10-CM | POA: Diagnosis not present

## 2020-06-02 DIAGNOSIS — R519 Headache, unspecified: Secondary | ICD-10-CM | POA: Diagnosis not present

## 2020-06-02 DIAGNOSIS — N186 End stage renal disease: Secondary | ICD-10-CM | POA: Diagnosis not present

## 2020-06-02 DIAGNOSIS — R52 Pain, unspecified: Secondary | ICD-10-CM | POA: Diagnosis not present

## 2020-06-02 DIAGNOSIS — D631 Anemia in chronic kidney disease: Secondary | ICD-10-CM | POA: Diagnosis not present

## 2020-06-06 ENCOUNTER — Institutional Professional Consult (permissible substitution): Payer: Medicare Other | Admitting: Diagnostic Neuroimaging

## 2020-06-06 ENCOUNTER — Telehealth: Payer: Self-pay | Admitting: *Deleted

## 2020-06-06 ENCOUNTER — Encounter: Payer: Self-pay | Admitting: Diagnostic Neuroimaging

## 2020-06-06 DIAGNOSIS — I129 Hypertensive chronic kidney disease with stage 1 through stage 4 chronic kidney disease, or unspecified chronic kidney disease: Secondary | ICD-10-CM | POA: Diagnosis not present

## 2020-06-06 DIAGNOSIS — Z992 Dependence on renal dialysis: Secondary | ICD-10-CM | POA: Diagnosis not present

## 2020-06-06 DIAGNOSIS — N186 End stage renal disease: Secondary | ICD-10-CM | POA: Diagnosis not present

## 2020-06-06 NOTE — Telephone Encounter (Signed)
Patient was no show for hospital stroke FU appointment today.

## 2020-06-07 DIAGNOSIS — N186 End stage renal disease: Secondary | ICD-10-CM | POA: Diagnosis not present

## 2020-06-07 DIAGNOSIS — N2581 Secondary hyperparathyroidism of renal origin: Secondary | ICD-10-CM | POA: Diagnosis not present

## 2020-06-07 DIAGNOSIS — Z992 Dependence on renal dialysis: Secondary | ICD-10-CM | POA: Diagnosis not present

## 2020-06-09 DIAGNOSIS — N2581 Secondary hyperparathyroidism of renal origin: Secondary | ICD-10-CM | POA: Diagnosis not present

## 2020-06-09 DIAGNOSIS — N186 End stage renal disease: Secondary | ICD-10-CM | POA: Diagnosis not present

## 2020-06-09 DIAGNOSIS — Z992 Dependence on renal dialysis: Secondary | ICD-10-CM | POA: Diagnosis not present

## 2020-06-14 DIAGNOSIS — D631 Anemia in chronic kidney disease: Secondary | ICD-10-CM | POA: Diagnosis not present

## 2020-06-14 DIAGNOSIS — N2581 Secondary hyperparathyroidism of renal origin: Secondary | ICD-10-CM | POA: Diagnosis not present

## 2020-06-14 DIAGNOSIS — N186 End stage renal disease: Secondary | ICD-10-CM | POA: Diagnosis not present

## 2020-06-14 DIAGNOSIS — Z992 Dependence on renal dialysis: Secondary | ICD-10-CM | POA: Diagnosis not present

## 2020-06-16 DIAGNOSIS — N186 End stage renal disease: Secondary | ICD-10-CM | POA: Diagnosis not present

## 2020-06-16 DIAGNOSIS — N2581 Secondary hyperparathyroidism of renal origin: Secondary | ICD-10-CM | POA: Diagnosis not present

## 2020-06-16 DIAGNOSIS — Z992 Dependence on renal dialysis: Secondary | ICD-10-CM | POA: Diagnosis not present

## 2020-06-16 DIAGNOSIS — D631 Anemia in chronic kidney disease: Secondary | ICD-10-CM | POA: Diagnosis not present

## 2020-06-17 ENCOUNTER — Emergency Department (HOSPITAL_COMMUNITY): Payer: Medicare Other

## 2020-06-17 ENCOUNTER — Inpatient Hospital Stay (HOSPITAL_COMMUNITY)
Admission: EM | Admit: 2020-06-17 | Discharge: 2020-06-23 | DRG: 246 | Disposition: A | Discharge status: Home / Self Care | Payer: Medicare Other | Attending: Family Medicine | Admitting: Family Medicine

## 2020-06-17 ENCOUNTER — Other Ambulatory Visit: Payer: Self-pay

## 2020-06-17 ENCOUNTER — Encounter (HOSPITAL_COMMUNITY): Payer: Self-pay

## 2020-06-17 DIAGNOSIS — Z7902 Long term (current) use of antithrombotics/antiplatelets: Secondary | ICD-10-CM | POA: Diagnosis not present

## 2020-06-17 DIAGNOSIS — I517 Cardiomegaly: Secondary | ICD-10-CM | POA: Diagnosis not present

## 2020-06-17 DIAGNOSIS — I480 Paroxysmal atrial fibrillation: Secondary | ICD-10-CM | POA: Diagnosis not present

## 2020-06-17 DIAGNOSIS — R079 Chest pain, unspecified: Secondary | ICD-10-CM

## 2020-06-17 DIAGNOSIS — I4892 Unspecified atrial flutter: Secondary | ICD-10-CM | POA: Diagnosis present

## 2020-06-17 DIAGNOSIS — Z823 Family history of stroke: Secondary | ICD-10-CM | POA: Diagnosis not present

## 2020-06-17 DIAGNOSIS — Z8616 Personal history of COVID-19: Secondary | ICD-10-CM

## 2020-06-17 DIAGNOSIS — F32A Depression, unspecified: Secondary | ICD-10-CM | POA: Diagnosis present

## 2020-06-17 DIAGNOSIS — I2510 Atherosclerotic heart disease of native coronary artery without angina pectoris: Secondary | ICD-10-CM | POA: Diagnosis not present

## 2020-06-17 DIAGNOSIS — N186 End stage renal disease: Secondary | ICD-10-CM

## 2020-06-17 DIAGNOSIS — D631 Anemia in chronic kidney disease: Secondary | ICD-10-CM | POA: Diagnosis present

## 2020-06-17 DIAGNOSIS — I498 Other specified cardiac arrhythmias: Secondary | ICD-10-CM | POA: Diagnosis not present

## 2020-06-17 DIAGNOSIS — R000 Tachycardia, unspecified: Secondary | ICD-10-CM | POA: Diagnosis not present

## 2020-06-17 DIAGNOSIS — R072 Precordial pain: Secondary | ICD-10-CM | POA: Diagnosis not present

## 2020-06-17 DIAGNOSIS — N2581 Secondary hyperparathyroidism of renal origin: Secondary | ICD-10-CM | POA: Diagnosis present

## 2020-06-17 DIAGNOSIS — Z883 Allergy status to other anti-infective agents status: Secondary | ICD-10-CM | POA: Diagnosis not present

## 2020-06-17 DIAGNOSIS — I471 Supraventricular tachycardia: Secondary | ICD-10-CM | POA: Diagnosis not present

## 2020-06-17 DIAGNOSIS — Z79899 Other long term (current) drug therapy: Secondary | ICD-10-CM

## 2020-06-17 DIAGNOSIS — I214 Non-ST elevation (NSTEMI) myocardial infarction: Secondary | ICD-10-CM

## 2020-06-17 DIAGNOSIS — Z8249 Family history of ischemic heart disease and other diseases of the circulatory system: Secondary | ICD-10-CM

## 2020-06-17 DIAGNOSIS — I358 Other nonrheumatic aortic valve disorders: Secondary | ICD-10-CM | POA: Diagnosis not present

## 2020-06-17 DIAGNOSIS — Z992 Dependence on renal dialysis: Secondary | ICD-10-CM | POA: Diagnosis not present

## 2020-06-17 DIAGNOSIS — Z8673 Personal history of transient ischemic attack (TIA), and cerebral infarction without residual deficits: Secondary | ICD-10-CM | POA: Diagnosis not present

## 2020-06-17 DIAGNOSIS — I10 Essential (primary) hypertension: Secondary | ICD-10-CM

## 2020-06-17 DIAGNOSIS — R7989 Other specified abnormal findings of blood chemistry: Secondary | ICD-10-CM | POA: Diagnosis present

## 2020-06-17 DIAGNOSIS — I200 Unstable angina: Secondary | ICD-10-CM | POA: Diagnosis not present

## 2020-06-17 DIAGNOSIS — I132 Hypertensive heart and chronic kidney disease with heart failure and with stage 5 chronic kidney disease, or end stage renal disease: Secondary | ICD-10-CM | POA: Diagnosis present

## 2020-06-17 DIAGNOSIS — F419 Anxiety disorder, unspecified: Secondary | ICD-10-CM

## 2020-06-17 DIAGNOSIS — R9439 Abnormal result of other cardiovascular function study: Secondary | ICD-10-CM

## 2020-06-17 DIAGNOSIS — I5032 Chronic diastolic (congestive) heart failure: Secondary | ICD-10-CM

## 2020-06-17 DIAGNOSIS — E8889 Other specified metabolic disorders: Secondary | ICD-10-CM | POA: Diagnosis present

## 2020-06-17 DIAGNOSIS — E669 Obesity, unspecified: Secondary | ICD-10-CM | POA: Diagnosis present

## 2020-06-17 DIAGNOSIS — I639 Cerebral infarction, unspecified: Secondary | ICD-10-CM | POA: Diagnosis not present

## 2020-06-17 DIAGNOSIS — R0789 Other chest pain: Secondary | ICD-10-CM

## 2020-06-17 DIAGNOSIS — R778 Other specified abnormalities of plasma proteins: Secondary | ICD-10-CM

## 2020-06-17 DIAGNOSIS — I25110 Atherosclerotic heart disease of native coronary artery with unstable angina pectoris: Secondary | ICD-10-CM | POA: Diagnosis not present

## 2020-06-17 DIAGNOSIS — Z9114 Patient's other noncompliance with medication regimen: Secondary | ICD-10-CM | POA: Diagnosis not present

## 2020-06-17 DIAGNOSIS — I21A1 Myocardial infarction type 2: Principal | ICD-10-CM | POA: Diagnosis present

## 2020-06-17 DIAGNOSIS — I120 Hypertensive chronic kidney disease with stage 5 chronic kidney disease or end stage renal disease: Secondary | ICD-10-CM | POA: Diagnosis not present

## 2020-06-17 DIAGNOSIS — Z6837 Body mass index (BMI) 37.0-37.9, adult: Secondary | ICD-10-CM

## 2020-06-17 LAB — CBC WITH DIFFERENTIAL/PLATELET
Abs Immature Granulocytes: 0.09 10*3/uL — ABNORMAL HIGH (ref 0.00–0.07)
Basophils Absolute: 0.1 10*3/uL (ref 0.0–0.1)
Basophils Relative: 1 %
Eosinophils Absolute: 0.2 10*3/uL (ref 0.0–0.5)
Eosinophils Relative: 2 %
HCT: 29.5 % — ABNORMAL LOW (ref 36.0–46.0)
Hemoglobin: 9.5 g/dL — ABNORMAL LOW (ref 12.0–15.0)
Immature Granulocytes: 1 %
Lymphocytes Relative: 14 %
Lymphs Abs: 1.4 10*3/uL (ref 0.7–4.0)
MCH: 31.4 pg (ref 26.0–34.0)
MCHC: 32.2 g/dL (ref 30.0–36.0)
MCV: 97.4 fL (ref 80.0–100.0)
Monocytes Absolute: 1.3 10*3/uL — ABNORMAL HIGH (ref 0.1–1.0)
Monocytes Relative: 13 %
Neutro Abs: 7.1 10*3/uL (ref 1.7–7.7)
Neutrophils Relative %: 69 %
Platelets: 199 10*3/uL (ref 150–400)
RBC: 3.03 MIL/uL — ABNORMAL LOW (ref 3.87–5.11)
RDW: 14.6 % (ref 11.5–15.5)
WBC: 10.1 10*3/uL (ref 4.0–10.5)
nRBC: 0 % (ref 0.0–0.2)

## 2020-06-17 LAB — COMPREHENSIVE METABOLIC PANEL
ALT: 11 U/L (ref 0–44)
AST: 17 U/L (ref 15–41)
Albumin: 3.5 g/dL (ref 3.5–5.0)
Alkaline Phosphatase: 66 U/L (ref 38–126)
Anion gap: 16 — ABNORMAL HIGH (ref 5–15)
BUN: 33 mg/dL — ABNORMAL HIGH (ref 6–20)
CO2: 27 mmol/L (ref 22–32)
Calcium: 8.8 mg/dL — ABNORMAL LOW (ref 8.9–10.3)
Chloride: 97 mmol/L — ABNORMAL LOW (ref 98–111)
Creatinine, Ser: 8.61 mg/dL — ABNORMAL HIGH (ref 0.44–1.00)
GFR, Estimated: 5 mL/min — ABNORMAL LOW (ref >60)
Glucose, Bld: 111 mg/dL — ABNORMAL HIGH (ref 70–99)
Potassium: 4.4 mmol/L (ref 3.5–5.1)
Sodium: 140 mmol/L (ref 135–145)
Total Bilirubin: 0.2 mg/dL — ABNORMAL LOW (ref 0.3–1.2)
Total Protein: 8.1 g/dL (ref 6.5–8.1)

## 2020-06-17 LAB — TROPONIN I (HIGH SENSITIVITY)
Troponin I (High Sensitivity): 174 ng/L (ref <18)
Troponin I (High Sensitivity): 233 ng/L (ref <18)

## 2020-06-17 LAB — LIPASE, BLOOD: Lipase: 44 U/L (ref 11–51)

## 2020-06-17 NOTE — ED Notes (Signed)
Pa notified of critical troponin 174

## 2020-06-17 NOTE — ED Notes (Signed)
Lab called for blood work result, lab states blood processing.

## 2020-06-17 NOTE — ED Triage Notes (Signed)
Per [facility] , pt coming from home, reporting laying in bed and having stabbing chest pain lasting 35 min. On monitor pt in SVT., no prior history. Pt converted herself with no intervention. Hx stroke and endoscopy and found a "place in her heart". Hx htn. Pt took '324mg'$  aspirin and 3 nitro prior to ems arrival. 20g in right hand. Pt left arm restrict for dialysis t,th, Saturday. Pt sinus rhythm at this time.

## 2020-06-17 NOTE — ED Provider Notes (Signed)
[facility] EMERGENCY DEPARTMENT Provider Note   CSN: [id number] Arrival date & time: 06/17/20  1919     History Chief Complaint  Patient presents with  . Palpitations  . Chest Pain    Erin Dean is a 55 y.o. female with PMHx HTN, ESRD on dialysis T Th S, and stroke who presents to the ED today with complaint of sudden onset, constant, sharp, substernal chest pain that began approximately 1.5 hours ago. Pt reports that she had just eaten a [place] sub and 5 minutes later was laying on the couch when she began to have the pain. Pt took 1 NTG without relief and called her daughter who recommended she take another NTG; pt took an additional NTG without relief and her daughter called EMS. With EMS pt was given another 1 NTG (total of 3) and 324 mg ASA. Her EKG showed SVT with HR 187 however pt converted herself on the way to the ED without intervention. Pt states that shortly after taking the additional NTG and 324 mg ASA her pain went away and her heart stopped beating fast. She denies history of SVT in the past however it does appear it is listed in her PMHx. Pt denies hx of MI or family history of MI. She is a never smoker. She did missed dialysis last Saturday however completed Tuesday and Thursday without difficulty. She does mention she had a similar episode of her heart beating fast yesterday at dialysis which resolved after additional fluids. Pt also mentions she was nauseated with the chest pain however did not vomit. Denies fevers, chills, cough, shortness of breath, hemoptysis, abdominal pain, vomiting, diarrhea, or any other associated symptoms.   The history is provided by the patient and medical records.       Past Medical History:  Diagnosis Date  . ESRD (end stage renal disease) ([place])    T[facility]S [place]   . Hypertension   . Stroke ([place]) 04/2017   no residual . Limp left side  . SVT (supraventricular tachycardia) [facility])     Patient Active Problem List    Diagnosis Date Noted  . Chest pain 06/17/2020  . Acute CVA (cerebrovascular accident) ([place]) 03/31/2020  . COVID-19 virus infection 03/31/2020  . Chronic diastolic (congestive) heart failure ([place]) 03/31/2020  . ESRD on dialysis ([place]) 05/06/2018  . Hyperlipidemia 03/11/2018  . Family hx-stroke 03/11/2018  . CKD (chronic kidney disease) stage 5, GFR less than 15 ml/min (HCC) 07/31/2017  . Cryptogenic stroke ([place])   . Hypertensive emergency   . Demand ischemia ([place])   . Stroke-like episode ([place]) s/p tPA 04/17/2017  . Essential hypertension 12/01/2015  . Obesity 12/01/2015  . Lower extremity edema 12/01/2015  . Heart murmur 12/01/2015  . Hypertensive urgency 09/22/2012  . Anxiety 09/22/2012    Past Surgical History:  Procedure Laterality Date  . AV FISTULA PLACEMENT Right 08/16/2017   Procedure: CREATION OF RADIOCEPHALIC VERSUS BRACHIOCEPHALIC ARTERIOVENOUS FISTULA RIGHT ARM;  Surgeon: Conrad Naples, MD;  Location: [facility];  Service: Vascular;  Laterality: Right;  . AV FISTULA PLACEMENT Right 02/07/2018   Procedure: Creation Right arm Brachiocephalic Fistula;  Surgeon: Marty Heck, MD;  Location[facility];  Service: Vascular;  Laterality: Right;  . AV FISTULA PLACEMENT Left 08/15/2018   Procedure: ARTERIOVENOUS (AV) FISTULA CREATION;  Surgeon: Serafina Mitchell, MD;  Location: [facility];  Service: Vascular;  Laterality: Left;  . BASCILIC VEIN TRANSPOSITION Right 04/07/2018   Procedure: SECOND STAGE BASILIC VEIN TRANSPOSITION RIGHT  ARM;  Surgeon: Marty Heck, MD;  Location: [facility];  Service: Vascular;  Laterality: Right;  . CESAREAN SECTION    . ESOPHAGOGASTRODUODENOSCOPY N/A 04/19/2017   Procedure: ESOPHAGOGASTRODUODENOSCOPY (EGD);  Surgeon: Carol Ada, MD;  Location: [facility];  Service: Endoscopy;  Laterality: N/A;  . FISTULA SUPERFICIALIZATION Right 10/01/2017   Procedure: FISTULA SUPERFICIALIZATION RIGHT ARM;  Surgeon: Conrad , MD;  Location: [facility];  Service: Vascular;   Laterality: Right;  . FISTULA SUPERFICIALIZATION Left 10/08/2018   Procedure: FISTULA SUPERFICIALIZATION LEFT ARM;  Surgeon: Serafina Mitchell, MD;  Location: [place];  Service: Vascular;  Laterality: Left;  . IR FLUORO GUIDE CV LINE RIGHT  04/23/2018  . IR US GUIDE VASC ACCESS RIGHT  04/23/2018  . LOOP RECORDER INSERTION N/A 04/22/2017   Procedure: LOOP RECORDER INSERTION;  Surgeon: Constance Haw, MD;  Location: [place] [facility] CV LAB;  Service: Cardiovascular;  Laterality: N/A;  . RADIOLOGY WITH ANESTHESIA N/A 04/01/2020   Procedure: MRI W/O CONSTRAST  WITH ANESTHESIA;  Surgeon: Radiologist, Medication, MD;  Location: [facility];  Service: Radiology;  Laterality: N/A;  . TEE WITHOUT CARDIOVERSION N/A 04/19/2017   Procedure: TRANSESOPHAGEAL ECHOCARDIOGRAM (TEE);  Surgeon: Carol Ada, MD;  Location: [place];  Service: Endoscopy;  Laterality: N/A;     OB History   No obstetric history on file.     Family History  Problem Relation Age of Onset  . Heart attack Maternal Uncle   . Heart attack Maternal Grandfather   . Stroke Paternal Grandmother   . Cancer Father   . Hypertension Sister     Social History   Tobacco Use  . Smoking status: Never Smoker  . Smokeless tobacco: Never Used  Vaping Use  . Vaping Use: Never used  Substance Use Topics  . Alcohol use: No  . Drug use: No    Home Medications Prior to Admission medications   Medication Sig Start Date End Date Taking? Authorizing Provider  acetaminophen (TYLENOL) 325 MG tablet Take 2 tablets (650 mg total) by mouth every 6 (six) hours as needed for mild pain (or Fever >/= 101). 04/03/20   Elgergawy, Silver Huguenin, MD  ALPRAZolam Duanne Moron) 0.5 MG tablet Take 0.5 mg by mouth daily as needed for anxiety.  07/16/18   [provider]  amLODipine (NORVASC) 10 MG tablet Take 1 tablet (10 mg total) by mouth daily. 04/05/20   Elgergawy, Silver Huguenin, MD  atorvastatin (LIPITOR) 80 MG tablet Take 1 tablet (80 mg total) by mouth daily at 6 PM.  04/03/20   Elgergawy, Silver Huguenin, MD  calcitRIOL (ROCALTROL) 0.25 MCG capsule Take 1 capsule (0.25 mcg total) by mouth daily. 03/11/18   Donzetta Starch, NP  carvedilol (COREG) 25 MG tablet Take 1 tablet (25 mg total) by mouth 2 (two) times daily with a meal. 03/11/18   Donzetta Starch, NP  Cinacalcet HCl (SENSIPAR PO) Take 1 tablet by mouth daily. 10/31/19 10/29/20  [provider]  cloNIDine (CATAPRES) 0.1 MG tablet Take 1 tablet (0.1 mg total) by mouth 3 (three) times daily. 04/07/20   Elgergawy, Silver Huguenin, MD  cyanocobalamin 1000 MCG tablet Take 1 tablet (1,000 mcg total) by mouth daily. 03/11/18   Donzetta Starch, NP  escitalopram (LEXAPRO) 10 MG tablet Take 1 tablet (10 mg total) by mouth daily. 03/11/18   Donzetta Starch, NP  ferrous sulfate 325 (65 FE) MG tablet Take 1 tablet (325 mg total) by mouth 3 (three) times daily with meals. 03/11/18  Donzetta Starch, NP  furosemide (LASIX) 80 MG tablet Take 1 tablet (80 mg total) by mouth 2 (two) times daily. Take with 40 mg to equal 120 mg twice daily Patient taking differently: Take 80 mg by mouth 2 (two) times daily. 03/11/18   Donzetta Starch, NP  hydrALAZINE (APRESOLINE) 100 MG tablet Take 1 tablet (100 mg total) by mouth 3 (three) times daily. 04/09/20   Elgergawy, Silver Huguenin, MD  Meclizine HCl 25 MG CHEW Chew 25 mg by mouth 3 (three) times daily as needed.    [provider]  multivitamin (RENA-VIT) TABS tablet Take 1 tablet by mouth daily.  05/01/18   [provider]  omeprazole (PRILOSEC) 20 MG capsule Take 20 mg by mouth daily. 02/12/19   [provider]  pantoprazole (PROTONIX) 40 MG tablet Take 1 tablet (40 mg total) by mouth daily. 03/11/18   Donzetta Starch, NP  RENAGEL 800 MG tablet Take 3,200 mg by mouth See admin instructions. Take 3200 mg with each meal and snack 07/23/18   [provider]  ticagrelor (BRILINTA) 90 MG TABS tablet Take 1 tablet (90 mg total) by mouth 2 (two) times daily. 04/03/20   Elgergawy,  Silver Huguenin, MD    Allergies    Chlorhexidine gluconate  Review of Systems   Review of Systems  Constitutional: Negative for chills and fever.  Respiratory: Negative for cough and shortness of breath.   Cardiovascular: Positive for chest pain and palpitations. Negative for leg swelling.  Gastrointestinal: Positive for nausea. Negative for abdominal pain and vomiting.  All other systems reviewed and are negative.   Physical Exam Updated Vital Signs BP (!) 149/91   Pulse 94   Temp 98.9 F (37.2 C) (Oral)   Resp (!) 26   Ht '5\' 8"'$  (1.727 m)   Wt 98.9 kg   LMP 08/12/2017   SpO2 99%   BMI 33.15 kg/m   Physical Exam Vitals and nursing note reviewed.  Constitutional:      Appearance: She is obese. She is not ill-appearing or diaphoretic.  HENT:     Head: Normocephalic and atraumatic.  Eyes:     Conjunctiva/sclera: Conjunctivae normal.  Cardiovascular:     Rate and Rhythm: Normal rate and regular rhythm.     Pulses:          Radial pulses are 2+ on the right side and 2+ on the left side.       Dorsalis pedis pulses are 2+ on the right side and 2+ on the left side.     Heart sounds: Normal heart sounds.  Pulmonary:     Effort: Pulmonary effort is normal.     Breath sounds: Normal breath sounds. No decreased breath sounds, wheezing, rhonchi or rales.  Chest:     Chest wall: No tenderness.  Abdominal:     Palpations: Abdomen is soft.     Tenderness: There is no abdominal tenderness. There is no guarding or rebound.  Musculoskeletal:     Cervical back: Neck supple.  Skin:    General: Skin is warm and dry.  Neurological:     Mental Status: She is alert.     ED Results / Procedures / Treatments   Labs (all labs ordered are listed, but only abnormal results are displayed) Labs Reviewed  COMPREHENSIVE METABOLIC PANEL - Abnormal; Notable for the following components:      Result Value   Chloride 97 (*)    Glucose, Bld 111 (*)  BUN 33 (*)    Creatinine, Ser 8.61 (*)     Calcium 8.8 (*)    Total Bilirubin 0.2 (*)    GFR, Estimated 5 (*)    Anion gap 16 (*)    All other components within normal limits  CBC WITH DIFFERENTIAL/PLATELET - Abnormal; Notable for the following components:   RBC 3.03 (*)    Hemoglobin 9.5 (*)    HCT 29.5 (*)    Monocytes Absolute 1.3 (*)    Abs Immature Granulocytes 0.09 (*)    All other components within normal limits  TROPONIN I (HIGH SENSITIVITY) - Abnormal; Notable for the following components:   Troponin I (High Sensitivity) 174 (*)    All other components within normal limits  TROPONIN I (HIGH SENSITIVITY) - Abnormal; Notable for the following components:   Troponin I (High Sensitivity) 233 (*)    All other components within normal limits  LIPASE, BLOOD    EKG EKG Interpretation  Date/Time:  Friday June 17 2020 19:27:21 EST Ventricular Rate:  95 PR Interval:    QRS Duration: 97 QT Interval:  378 QTC Calculation: 476 R Axis:   54 Text Interpretation: Sinus rhythm Repol abnrm suggests ischemia, anterolateral new ST changes Confirmed by Davonna Belling [phone]) on 06/17/2020 10:16:53 PM   Radiology DG Chest Port 1 View  Result Date: 06/17/2020 CLINICAL DATA:  Chest pain EXAM: PORTABLE CHEST 1 VIEW COMPARISON:  March 31, 2020 FINDINGS: The cardiac silhouette is enlarged but stable from prior study. There is stable shift of the trachea to the right. There is no pneumothorax. No large pleural effusion or focal infiltrate. The previously demonstrated loop recorder is again noted. There is no acute osseous abnormality. IMPRESSION: Stable cardiomegaly. No acute cardiopulmonary process. Electronically Signed   By: Constance Holster M.D.   On: 06/17/2020 20:23    Procedures Procedures   Medications Ordered in ED Medications - No data to display  ED Course  I have reviewed the triage vital signs and the nursing notes.  Pertinent labs & imaging results that were available during my care of the patient were  reviewed by me and considered in my medical decision making (see chart for details).  Clinical Course as of 06/17/20 2333  Fri Jun 17, 2020  2223 Troponin I (High Sensitivity)(!!): 174 [MV]    Clinical Course User Index [MV] Eustaquio Maize, [place]   MDM Rules/Calculators/A&P                          55 year old female who presents to the ED today with complaint of sudden onset substernal chest pain with heart palpitations, nausea that began 1.5 hours ago.  Found to be in SVT with a heart rate of 187 with EMS however converted prior to arrival without intervention.  She did have a total of 3 nitroglycerin as well as 324 mg aspirin prior to arrival, currently having no chest pain.  On arrival to the ED vitals are stable.  Patient is afebrile, nontachycardic.  She is noted to be tachypneic with respirations of 26 on arrival however this is dissipated while in the room.  Satting 99% on room air.  She is a dialysis patient, last had dialysis yesterday.  Finished the entire session however does report that she had some similar episodes of palpitations then which resolved after fluids.  Question dehydration causing symptoms.  We will plan to work-up for chest pain at this time however given substernal chest pain  as well as obese female we will plan to obtain LFTs and lipase.   EKG here in NSR however does have some nonspecific ST changes  Labwork delayed - was sent down to lab in a timely manner however lab did not process it for unknown reason.   CBC without leukocytosis. Hgb stable at 9.5 CMP with creatinine 8.61; BUN 33; potassium WNL 4.4. No LFT elevations Lipase 44 Troponin has returned elevated at 174. Pt continues to be chest pain free. Will repeat EKG and consult cardiology.   Discussed case with cardiology fellow Dr. Marcelle Smiling who recommends repeat troponin and overnight observation with medicine and can follow along if necessary however does not recommend any other intervention at this time.  Can be re-consulted if repeat troponin is significantly elevated compared to first.   COVID test deferred at this time as pt was COVID positive 12/23  Repeat troponin 233 (from 174).  Reconsulted cardiology - Dr. Marcelle Smiling will place consult note with recommendations however does not recommend any additional intervention at this time. Suspect elevated s/2 her SVT episode.   Discussed case with hospitalist Dr. Myna Hidalgo who agrees to accept patient for admission.   This note was prepared using Dragon voice recognition software and may include unintentional dictation errors due to the inherent limitations of voice recognition software.  Final Clinical Impression(s) / ED Diagnoses Final diagnoses:  Atypical chest pain  SVT (supraventricular tachycardia) (HCC)  Elevated troponin  ESRD (end stage renal disease) [facility])    Rx / DC Orders ED Discharge Orders    None       Eustaquio Maize, Hershal Coria 06/17/20 2334    Davonna Belling, MD 06/17/20 2344

## 2020-06-18 DIAGNOSIS — R072 Precordial pain: Secondary | ICD-10-CM | POA: Diagnosis present

## 2020-06-18 DIAGNOSIS — N2581 Secondary hyperparathyroidism of renal origin: Secondary | ICD-10-CM | POA: Diagnosis not present

## 2020-06-18 DIAGNOSIS — Z992 Dependence on renal dialysis: Secondary | ICD-10-CM | POA: Diagnosis not present

## 2020-06-18 DIAGNOSIS — I5032 Chronic diastolic (congestive) heart failure: Secondary | ICD-10-CM | POA: Diagnosis not present

## 2020-06-18 DIAGNOSIS — R079 Chest pain, unspecified: Secondary | ICD-10-CM | POA: Diagnosis not present

## 2020-06-18 DIAGNOSIS — Z7902 Long term (current) use of antithrombotics/antiplatelets: Secondary | ICD-10-CM | POA: Diagnosis not present

## 2020-06-18 DIAGNOSIS — Z8616 Personal history of COVID-19: Secondary | ICD-10-CM | POA: Diagnosis not present

## 2020-06-18 DIAGNOSIS — Z8673 Personal history of transient ischemic attack (TIA), and cerebral infarction without residual deficits: Secondary | ICD-10-CM | POA: Diagnosis not present

## 2020-06-18 DIAGNOSIS — Z823 Family history of stroke: Secondary | ICD-10-CM | POA: Diagnosis not present

## 2020-06-18 DIAGNOSIS — Z6837 Body mass index (BMI) 37.0-37.9, adult: Secondary | ICD-10-CM | POA: Diagnosis not present

## 2020-06-18 DIAGNOSIS — I471 Supraventricular tachycardia: Secondary | ICD-10-CM | POA: Diagnosis not present

## 2020-06-18 DIAGNOSIS — I21A1 Myocardial infarction type 2: Secondary | ICD-10-CM | POA: Diagnosis not present

## 2020-06-18 DIAGNOSIS — Q210 Ventricular septal defect: Secondary | ICD-10-CM | POA: Diagnosis not present

## 2020-06-18 DIAGNOSIS — I200 Unstable angina: Secondary | ICD-10-CM | POA: Diagnosis not present

## 2020-06-18 DIAGNOSIS — Z9114 Patient's other noncompliance with medication regimen: Secondary | ICD-10-CM | POA: Diagnosis not present

## 2020-06-18 DIAGNOSIS — I132 Hypertensive heart and chronic kidney disease with heart failure and with stage 5 chronic kidney disease, or end stage renal disease: Secondary | ICD-10-CM | POA: Diagnosis not present

## 2020-06-18 DIAGNOSIS — I219 Acute myocardial infarction, unspecified: Secondary | ICD-10-CM | POA: Diagnosis not present

## 2020-06-18 DIAGNOSIS — E8889 Other specified metabolic disorders: Secondary | ICD-10-CM | POA: Diagnosis present

## 2020-06-18 DIAGNOSIS — I358 Other nonrheumatic aortic valve disorders: Secondary | ICD-10-CM | POA: Diagnosis not present

## 2020-06-18 DIAGNOSIS — N186 End stage renal disease: Secondary | ICD-10-CM | POA: Diagnosis not present

## 2020-06-18 DIAGNOSIS — I5030 Unspecified diastolic (congestive) heart failure: Secondary | ICD-10-CM | POA: Diagnosis not present

## 2020-06-18 DIAGNOSIS — R0789 Other chest pain: Secondary | ICD-10-CM | POA: Diagnosis not present

## 2020-06-18 DIAGNOSIS — I214 Non-ST elevation (NSTEMI) myocardial infarction: Secondary | ICD-10-CM | POA: Diagnosis not present

## 2020-06-18 DIAGNOSIS — I25110 Atherosclerotic heart disease of native coronary artery with unstable angina pectoris: Secondary | ICD-10-CM | POA: Diagnosis present

## 2020-06-18 DIAGNOSIS — F419 Anxiety disorder, unspecified: Secondary | ICD-10-CM | POA: Diagnosis present

## 2020-06-18 DIAGNOSIS — Z8249 Family history of ischemic heart disease and other diseases of the circulatory system: Secondary | ICD-10-CM | POA: Diagnosis not present

## 2020-06-18 DIAGNOSIS — I498 Other specified cardiac arrhythmias: Secondary | ICD-10-CM | POA: Diagnosis not present

## 2020-06-18 DIAGNOSIS — D631 Anemia in chronic kidney disease: Secondary | ICD-10-CM | POA: Diagnosis present

## 2020-06-18 DIAGNOSIS — R778 Other specified abnormalities of plasma proteins: Secondary | ICD-10-CM | POA: Diagnosis present

## 2020-06-18 DIAGNOSIS — I2510 Atherosclerotic heart disease of native coronary artery without angina pectoris: Secondary | ICD-10-CM | POA: Diagnosis not present

## 2020-06-18 DIAGNOSIS — I480 Paroxysmal atrial fibrillation: Secondary | ICD-10-CM | POA: Diagnosis not present

## 2020-06-18 DIAGNOSIS — N250 Renal osteodystrophy: Secondary | ICD-10-CM | POA: Diagnosis not present

## 2020-06-18 DIAGNOSIS — I639 Cerebral infarction, unspecified: Secondary | ICD-10-CM | POA: Diagnosis not present

## 2020-06-18 DIAGNOSIS — I10 Essential (primary) hypertension: Secondary | ICD-10-CM | POA: Diagnosis not present

## 2020-06-18 DIAGNOSIS — E669 Obesity, unspecified: Secondary | ICD-10-CM | POA: Diagnosis present

## 2020-06-18 DIAGNOSIS — F32A Depression, unspecified: Secondary | ICD-10-CM | POA: Diagnosis present

## 2020-06-18 DIAGNOSIS — I4892 Unspecified atrial flutter: Secondary | ICD-10-CM | POA: Diagnosis present

## 2020-06-18 DIAGNOSIS — Z883 Allergy status to other anti-infective agents status: Secondary | ICD-10-CM | POA: Diagnosis not present

## 2020-06-18 DIAGNOSIS — Z79899 Other long term (current) drug therapy: Secondary | ICD-10-CM | POA: Diagnosis not present

## 2020-06-18 LAB — BASIC METABOLIC PANEL
Anion gap: 14 (ref 5–15)
BUN: 41 mg/dL — ABNORMAL HIGH (ref 6–20)
CO2: 29 mmol/L (ref 22–32)
Calcium: 8.7 mg/dL — ABNORMAL LOW (ref 8.9–10.3)
Chloride: 96 mmol/L — ABNORMAL LOW (ref 98–111)
Creatinine, Ser: 9.69 mg/dL — ABNORMAL HIGH (ref 0.44–1.00)
GFR, Estimated: 4 mL/min — ABNORMAL LOW (ref >60)
Glucose, Bld: 101 mg/dL — ABNORMAL HIGH (ref 70–99)
Potassium: 4.9 mmol/L (ref 3.5–5.1)
Sodium: 139 mmol/L (ref 135–145)

## 2020-06-18 LAB — TROPONIN I (HIGH SENSITIVITY): Troponin I (High Sensitivity): 367 ng/L (ref <18)

## 2020-06-18 LAB — CBC
HCT: 27.6 % — ABNORMAL LOW (ref 36.0–46.0)
Hemoglobin: 8.6 g/dL — ABNORMAL LOW (ref 12.0–15.0)
MCH: 30.8 pg (ref 26.0–34.0)
MCHC: 31.2 g/dL (ref 30.0–36.0)
MCV: 98.9 fL (ref 80.0–100.0)
Platelets: 186 10*3/uL (ref 150–400)
RBC: 2.79 MIL/uL — ABNORMAL LOW (ref 3.87–5.11)
RDW: 14.6 % (ref 11.5–15.5)
WBC: 11 10*3/uL — ABNORMAL HIGH (ref 4.0–10.5)
nRBC: 0 % (ref 0.0–0.2)

## 2020-06-18 LAB — TSH: TSH: 1.148 u[IU]/mL (ref 0.350–4.500)

## 2020-06-18 LAB — MRSA PCR SCREENING: MRSA by PCR: NEGATIVE

## 2020-06-18 MED ORDER — ONDANSETRON HCL 4 MG/2ML IJ SOLN: 4.0000 mg | Freq: Four times a day (QID) | INTRAMUSCULAR | Status: DC | PRN

## 2020-06-18 MED ORDER — HEPARIN BOLUS VIA INFUSION
4000.0000 [IU] | Freq: Once | INTRAVENOUS | Status: AC
  Administered 2020-06-18: 4000 [IU] via INTRAVENOUS
  Filled 2020-06-18: qty 4000

## 2020-06-18 MED ORDER — ALPRAZOLAM 0.5 MG PO TABS: 0.5000 mg | ORAL_TABLET | Freq: Every day | ORAL | Status: DC | PRN

## 2020-06-18 MED ORDER — ACETAMINOPHEN 325 MG PO TABS
650.0000 mg | ORAL_TABLET | Freq: Four times a day (QID) | ORAL | Status: DC | PRN
  Administered 2020-06-19 – 2020-06-23 (×4): 650 mg via ORAL
  Filled 2020-06-18 (×4): qty 2

## 2020-06-18 MED ORDER — ONDANSETRON HCL 4 MG PO TABS: 4.0000 mg | ORAL_TABLET | Freq: Four times a day (QID) | ORAL | Status: DC | PRN

## 2020-06-18 MED ORDER — CINACALCET HCL 30 MG PO TABS
30.0000 mg | ORAL_TABLET | Freq: Every day | ORAL | Status: DC
  Administered 2020-06-18 – 2020-06-22 (×3): 30 mg via ORAL
  Filled 2020-06-18 (×6): qty 1

## 2020-06-18 MED ORDER — FUROSEMIDE 80 MG PO TABS
80.0000 mg | ORAL_TABLET | Freq: Two times a day (BID) | ORAL | Status: DC
  Administered 2020-06-18 – 2020-06-20 (×5): 80 mg via ORAL
  Filled 2020-06-18 (×3): qty 1
  Filled 2020-06-18: qty 4
  Filled 2020-06-18 (×2): qty 1

## 2020-06-18 MED ORDER — ACETAMINOPHEN 650 MG RE SUPP: 650.0000 mg | Freq: Four times a day (QID) | RECTAL | Status: DC | PRN

## 2020-06-18 MED ORDER — CARVEDILOL 25 MG PO TABS
25.0000 mg | ORAL_TABLET | Freq: Two times a day (BID) | ORAL | Status: DC
  Administered 2020-06-18 – 2020-06-21 (×7): 25 mg via ORAL
  Filled 2020-06-18 (×4): qty 1
  Filled 2020-06-18: qty 8
  Filled 2020-06-18 (×3): qty 1

## 2020-06-18 MED ORDER — TICAGRELOR 90 MG PO TABS
90.0000 mg | ORAL_TABLET | Freq: Two times a day (BID) | ORAL | Status: AC
  Administered 2020-06-18 – 2020-06-20 (×4): 90 mg via ORAL
  Filled 2020-06-18 (×4): qty 1

## 2020-06-18 MED ORDER — CLONIDINE HCL 0.1 MG PO TABS
0.1000 mg | ORAL_TABLET | Freq: Three times a day (TID) | ORAL | Status: DC
  Administered 2020-06-18 – 2020-06-19 (×4): 0.1 mg via ORAL
  Filled 2020-06-18 (×4): qty 1

## 2020-06-18 MED ORDER — HEPARIN SODIUM (PORCINE) 5000 UNIT/ML IJ SOLN
5000.0000 [IU] | Freq: Three times a day (TID) | INTRAMUSCULAR | Status: DC
  Administered 2020-06-18: 5000 [IU] via SUBCUTANEOUS
  Filled 2020-06-18 (×2): qty 1

## 2020-06-18 MED ORDER — HEPARIN (PORCINE) 25000 UT/250ML-% IV SOLN
1050.0000 [IU]/h | INTRAVENOUS | Status: DC
  Administered 2020-06-18: 1250 [IU]/h via INTRAVENOUS
  Administered 2020-06-19: 1050 [IU]/h via INTRAVENOUS
  Filled 2020-06-18 (×4): qty 250

## 2020-06-18 MED ORDER — ESCITALOPRAM OXALATE 10 MG PO TABS
10.0000 mg | ORAL_TABLET | Freq: Every day | ORAL | Status: DC
  Administered 2020-06-18 – 2020-06-22 (×4): 10 mg via ORAL
  Filled 2020-06-18 (×4): qty 1

## 2020-06-18 MED ORDER — HYDRALAZINE HCL 50 MG PO TABS
100.0000 mg | ORAL_TABLET | Freq: Three times a day (TID) | ORAL | Status: DC
  Administered 2020-06-18 – 2020-06-19 (×4): 100 mg via ORAL
  Filled 2020-06-18 (×4): qty 2

## 2020-06-18 MED ORDER — ATORVASTATIN CALCIUM 80 MG PO TABS
80.0000 mg | ORAL_TABLET | Freq: Every day | ORAL | Status: DC
  Administered 2020-06-18 – 2020-06-22 (×5): 80 mg via ORAL
  Filled 2020-06-18: qty 1
  Filled 2020-06-18 (×3): qty 2
  Filled 2020-06-18: qty 1

## 2020-06-18 MED ORDER — CALCITRIOL 0.25 MCG PO CAPS
0.2500 ug | ORAL_CAPSULE | Freq: Every day | ORAL | Status: DC
  Administered 2020-06-18 – 2020-06-22 (×3): 0.25 ug via ORAL
  Filled 2020-06-18 (×3): qty 1

## 2020-06-18 MED ORDER — PANTOPRAZOLE SODIUM 40 MG PO TBEC
40.0000 mg | DELAYED_RELEASE_TABLET | Freq: Every day | ORAL | Status: DC
  Administered 2020-06-18 – 2020-06-22 (×4): 40 mg via ORAL
  Filled 2020-06-18 (×4): qty 1

## 2020-06-18 MED ORDER — AMLODIPINE BESYLATE 10 MG PO TABS
10.0000 mg | ORAL_TABLET | Freq: Every day | ORAL | Status: DC
  Administered 2020-06-18 – 2020-06-20 (×2): 10 mg via ORAL
  Filled 2020-06-18: qty 1
  Filled 2020-06-18: qty 2
  Filled 2020-06-18: qty 1

## 2020-06-18 MED ORDER — SEVELAMER CARBONATE 800 MG PO TABS
3200.0000 mg | ORAL_TABLET | Freq: Three times a day (TID) | ORAL | Status: DC
  Administered 2020-06-18 – 2020-06-21 (×6): 3200 mg via ORAL
  Filled 2020-06-18 (×6): qty 4

## 2020-06-18 NOTE — Consult Note (Signed)
Erin Dean is a 54 year old female with a history of ESRD on hemodialysis pretension, prior history of SVT, and recent CVA who was brought into the emergency department by EMS after having sudden onset chest pain after eating a sandwich.  Patient noted to take multiple sublingual nitroglycerin and full dose aspirin.  Of note, per report she was in SVT with heart rates in the 180s when EMS arrived to her home.  This was self resolved and her chest pain improved.  Upon arrival to the emergency department she is chest pain-free and hemodynamically stable..  Her initial troponin was 174 with repeat at 233.  Given her lack of clinical symptoms at this time and unclear trigger for the symptoms but likely her SVT, would not medically manage for elevated troponin.  Should continue to monitor for any recurrence of symptoms.  Should she have recurrence of her SVT, then cardiology can further evaluate.

## 2020-06-18 NOTE — Progress Notes (Addendum)
Erin Dean is admitted Obs status for evaluation of chest pain.   Last HD 3/10. Due for routine dialysis today. Will place orders.   HD orders: [facility] TTS  4h 400/500 EDW 108 kg 2K/2Ca UFP 4   Lynnda Child PA-C [facility] 06/18/2020,3:09 PM  Pt seen, examined and agree w A/P as above.  Kelly Splinter  MD 06/18/2020, 3:27 PM

## 2020-06-18 NOTE — ED Notes (Signed)
Admitting md at bedside

## 2020-06-18 NOTE — H&P (Signed)
History and Physical    Erin Dean [id number] DOB: 04-04-1966 DOA: 06/17/2020  PCP: Lois Huxley, PA   Patient coming from: Home   Chief Complaint: Chest pain   HPI: Erin Dean is a 55 y.o. female with medical history significant for ESRD on hemodialysis, hypertension, history of multiple CVAs, depression, and anxiety, now presenting to the emergency department for evaluation of chest pain.  Patient reports that she has had several episodes of chest pain over the past month, often associated with rapid palpitations, and had a similar episode developed while she was lying down yesterday and was more intense than the previous instances.  She took 2 doses of nitroglycerin without relief and called EMS.  She was found to be tachycardic in the 180s with rhythm strip indicating atrial fibrillation.  She was given 324 mg of aspirin and a third nitroglycerin, return to a normal sinus rhythm, and had resolution of her symptoms.  She had a loop recorder a few years ago after cryptogenic stroke and patient reports that no arrhythmia was noted.  During work-up for her most recent CVA in December 2021, a possible mobile echodensity was noted on the aortic valve on TTE and she was scheduled for outpatient TEE, missed the appointment, and states that she has been planning to reschedule.  ED Course: Upon arrival to the ED, patient is found to be afebrile, saturating well on room air, and with blood pressure 165/67.  EKG features sinus rhythm with anterolateral T wave abnormality.  Chest x-ray notable for stable cardiomegaly but no acute findings.  Chemistry panel features a normal potassium, normal bicarbonate, and BUN 33.  Hemoglobin appears stable at 9.5.  High-sensitivity troponin was elevated 274 and then 233.  ED PA discussed the case with cardiology who advised against anticoagulating as symptoms are likely from the transient arrhythmia and not ACS.  Review of Systems:  All other systems  reviewed and apart from HPI, are negative.  Past Medical History:  Diagnosis Date  . ESRD (end stage renal disease) ([place])    T[facility] [place]   . Hypertension   . Stroke ([place]) 04/2017   no residual . Limp left side  . SVT (supraventricular tachycardia) ([facility])     Past Surgical History:  Procedure Laterality Date  . AV FISTULA PLACEMENT Right 08/16/2017   Procedure: CREATION OF RADIOCEPHALIC VERSUS BRACHIOCEPHALIC ARTERIOVENOUS FISTULA RIGHT ARM;  Surgeon: Conrad Swan, MD;  Location: [facility];  Service: Vascular;  Laterality: Right;  . AV FISTULA PLACEMENT Right 02/07/2018   Procedure: Creation Right arm Brachiocephalic Fistula;  Surgeon: Marty Heck, MD;  Location: [facility] OR;  Service: Vascular;  Laterality: Right;  . AV FISTULA PLACEMENT Left 08/15/2018   Procedure: ARTERIOVENOUS (AV) FISTULA CREATION;  Surgeon: Serafina Mitchell, MD;  Location: [place];  Service: Vascular;  Laterality: Left;  . BASCILIC VEIN TRANSPOSITION Right 04/07/2018   Procedure: SECOND STAGE BASILIC VEIN TRANSPOSITION RIGHT ARM;  Surgeon: Marty Heck, MD;  Location: [place];  Service: Vascular;  Laterality: Right;  . CESAREAN SECTION    . ESOPHAGOGASTRODUODENOSCOPY N/A 04/19/2017   Procedure: ESOPHAGOGASTRODUODENOSCOPY (EGD);  Surgeon: Carol Ada, MD;  Location: [place];  Service: Endoscopy;  Laterality: N/A;  . FISTULA SUPERFICIALIZATION Right 10/01/2017   Procedure: FISTULA SUPERFICIALIZATION RIGHT ARM;  Surgeon: Conrad [place], MD;  Location: [place];  Service: Vascular;  Laterality: Right;  . FISTULA SUPERFICIALIZATION Left 10/08/2018   Procedure: FISTULA SUPERFICIALIZATION LEFT ARM;  Surgeon: Serafina Mitchell, MD;  Location: [facility];  Service: Vascular;  Laterality: Left;  . IR FLUORO GUIDE CV LINE RIGHT  04/23/2018  . IR US GUIDE VASC ACCESS RIGHT  04/23/2018  . LOOP RECORDER INSERTION N/A 04/22/2017   Procedure: LOOP RECORDER INSERTION;  Surgeon: Constance Haw, MD;  Location: [facility];   Service: Cardiovascular;  Laterality: N/A;  . RADIOLOGY WITH ANESTHESIA N/A 04/01/2020   Procedure: MRI W/O CONSTRAST  WITH ANESTHESIA;  Surgeon: Radiologist, Medication, MD;  Location: [place];  Service: Radiology;  Laterality: N/A;  . TEE WITHOUT CARDIOVERSION N/A 04/19/2017   Procedure: TRANSESOPHAGEAL ECHOCARDIOGRAM (TEE);  Surgeon: Carol Ada, MD;  Location: [place];  Service: Endoscopy;  Laterality: N/A;    Social History:   reports that she has never smoked. She has never used smokeless tobacco. She reports that she does not drink alcohol and does not use drugs.  Allergies  Allergen Reactions  . Chlorhexidine Gluconate Rash    Family History  Problem Relation Age of Onset  . Heart attack Maternal Uncle   . Heart attack Maternal Grandfather   . Stroke Paternal Grandmother   . Cancer Father   . Hypertension Sister      Prior to Admission medications   Medication Sig Start Date End Date Taking? Authorizing Provider  acetaminophen (TYLENOL) 325 MG tablet Take 2 tablets (650 mg total) by mouth every 6 (six) hours as needed for mild pain (or Fever >/= 101). 04/03/20  Yes Elgergawy, Silver Huguenin, MD  ALPRAZolam Duanne Moron) 0.5 MG tablet Take 0.5 mg by mouth daily as needed for anxiety.  07/16/18  Yes [provider]  amLODipine (NORVASC) 10 MG tablet Take 1 tablet (10 mg total) by mouth daily. 04/05/20  Yes Elgergawy, Silver Huguenin, MD  atorvastatin (LIPITOR) 80 MG tablet Take 1 tablet (80 mg total) by mouth daily at 6 PM. 04/03/20  Yes Elgergawy, Silver Huguenin, MD  calcitRIOL (ROCALTROL) 0.25 MCG capsule Take 1 capsule (0.25 mcg total) by mouth daily. 03/11/18  Yes Donzetta Starch, NP  carvedilol (COREG) 25 MG tablet Take 1 tablet (25 mg total) by mouth 2 (two) times daily with a meal. 03/11/18  Yes Donzetta Starch, NP  Cinacalcet HCl (SENSIPAR PO) Take 1 tablet by mouth daily. 10/31/19 10/29/20 Yes [provider]  cloNIDine (CATAPRES) 0.1 MG tablet Take 1 tablet (0.1 mg total) by  mouth 3 (three) times daily. 04/07/20  Yes Elgergawy, Silver Huguenin, MD  cyanocobalamin 1000 MCG tablet Take 1 tablet (1,000 mcg total) by mouth daily. 03/11/18  Yes Donzetta Starch, NP  escitalopram (LEXAPRO) 10 MG tablet Take 1 tablet (10 mg total) by mouth daily. 03/11/18  Yes Donzetta Starch, NP  ferrous sulfate 325 (65 FE) MG tablet Take 1 tablet (325 mg total) by mouth 3 (three) times daily with meals. 03/11/18  Yes Donzetta Starch, NP  furosemide (LASIX) 80 MG tablet Take 1 tablet (80 mg total) by mouth 2 (two) times daily. Take with 40 mg to equal 120 mg twice daily Patient taking differently: Take 80 mg by mouth 2 (two) times daily. 03/11/18  Yes Donzetta Starch, NP  hydrALAZINE (APRESOLINE) 100 MG tablet Take 1 tablet (100 mg total) by mouth 3 (three) times daily. 04/09/20  Yes Elgergawy, Silver Huguenin, MD  Meclizine HCl 25 MG CHEW Chew 25 mg by mouth 3 (three) times daily as needed.   Yes [provider]  multivitamin (RENA-VIT) TABS tablet Take 1 tablet by mouth daily.  05/01/18  Yes [provider]  nitroGLYCERIN (NITROSTAT) 0.4 MG SL tablet Place 0.4 mg under the tongue every 5 (five) minutes x 3 doses as needed for chest pain. 05/18/20  Yes [provider]  omeprazole (PRILOSEC) 20 MG capsule Take 20 mg by mouth daily. 02/12/19  Yes [provider]  RENAGEL 800 MG tablet Take 3,200 mg by mouth See admin instructions. Take 3200 mg with each meal and snack 07/23/18  Yes [provider]  ticagrelor (BRILINTA) 90 MG TABS tablet Take 1 tablet (90 mg total) by mouth 2 (two) times daily. 04/03/20  Yes Elgergawy, Silver Huguenin, MD    Physical Exam: Vitals:   06/17/20 2130 06/17/20 2200 06/17/20 2300 06/18/20 0130  BP: 140/67 (!) 146/65 (!) 165/67 (!) 174/81  Pulse: 86 83 72 73  Resp: (!) 23 20 (!) 22 (!) 29  Temp:      TempSrc:      SpO2: 95% 95% 98% 94%  Weight:      Height:        Constitutional: NAD, calm  Eyes: PERTLA, lids and conjunctivae normal ENMT: Mucous  membranes are moist. Posterior pharynx clear of any exudate or lesions.   Neck: normal, supple, no masses, no thyromegaly Respiratory: no wheezing, no crackles. No accessory muscle use.  Cardiovascular: S1 & S2 heard, regular rate and rhythm. No extremity edema.   Abdomen: No distension, no tenderness, soft. Bowel sounds active.  Musculoskeletal: no clubbing / cyanosis. No joint deformity upper and lower extremities.   Skin: no significant rashes, lesions, ulcers. Warm, dry, well-perfused. Neurologic: CN 2-12 grossly intact. Sensation intact. Moving all extremities.  Psychiatric: Alert and oriented to person, place, and situation. Very pleasant and cooperative.    Labs and Imaging on Admission: I have personally reviewed following labs and imaging studies  CBC: Recent Labs  Lab 06/17/20 2020  WBC 10.1  NEUTROABS 7.1  HGB 9.5*  HCT 29.5*  MCV 97.4  PLT 123XX123   Basic Metabolic Panel: Recent Labs  Lab 06/17/20 2020  NA 140  K 4.4  CL 97*  CO2 27  GLUCOSE 111*  BUN 33*  CREATININE 8.61*  CALCIUM 8.8*   GFR: Estimated Creatinine Clearance: 9.1 mL/min (A) (by C-G formula based on SCr of 8.61 mg/dL (H)). Liver Function Tests: Recent Labs  Lab 06/17/20 2020  AST 17  ALT 11  ALKPHOS 66  BILITOT 0.2*  PROT 8.1  ALBUMIN 3.5   Recent Labs  Lab 06/17/20 2020  LIPASE 44   No results for input(s): AMMONIA in the last 168 hours. Coagulation Profile: No results for input(s): INR, PROTIME in the last 168 hours. Cardiac Enzymes: No results for input(s): CKTOTAL, CKMB, CKMBINDEX, TROPONINI in the last 168 hours. BNP (last 3 results) No results for input(s): PROBNP in the last 8760 hours. HbA1C: No results for input(s): HGBA1C in the last 72 hours. CBG: No results for input(s): GLUCAP in the last 168 hours. Lipid Profile: No results for input(s): CHOL, HDL, LDLCALC, TRIG, CHOLHDL, LDLDIRECT in the last 72 hours. Thyroid Function Tests: No results for input(s): TSH,  T4TOTAL, FREET4, T3FREE, THYROIDAB in the last 72 hours. Anemia Panel: No results for input(s): VITAMINB12, FOLATE, FERRITIN, TIBC, IRON, RETICCTPCT in the last 72 hours. Urine analysis:    Component Value Date/Time   COLORURINE YELLOW 04/20/2017 0210   APPEARANCEUR CLEAR 04/20/2017 0210   LABSPEC 1.014 04/20/2017 0210   PHURINE 5.0 04/20/2017 0210   GLUCOSEU 50 (A) 04/20/2017 0210   HGBUR NEGATIVE 04/20/2017  0210   BILIRUBINUR NEGATIVE 04/20/2017 0210   KETONESUR NEGATIVE 04/20/2017 0210   PROTEINUR 100 (A) 04/20/2017 0210   NITRITE NEGATIVE 04/20/2017 0210   LEUKOCYTESUR NEGATIVE 04/20/2017 0210   Sepsis Labs: '@LABRCNTIP'$ (procalcitonin:4,lacticidven:4) )No results found for this or any previous visit (from the past 240 hour(s)).   Radiological Exams on Admission: DG Chest Port 1 View  Result Date: 06/17/2020 CLINICAL DATA:  Chest pain EXAM: PORTABLE CHEST 1 VIEW COMPARISON:  March 31, 2020 FINDINGS: The cardiac silhouette is enlarged but stable from prior study. There is stable shift of the trachea to the right. There is no pneumothorax. No large pleural effusion or focal infiltrate. The previously demonstrated loop recorder is again noted. There is no acute osseous abnormality. IMPRESSION: Stable cardiomegaly. No acute cardiopulmonary process. Electronically Signed   By: Constance Holster M.D.   On: 06/17/2020 20:23    EKG: Independently reviewed. Sinus rhythm, anterolateral T-wave abnormality.  Assessment/Plan   1. Chest pain; elevated troponin; transient arrhythmia  - Presents after an episode of chest pain associated with transient tachyarrhythmia and is found to have elevated troponin  - Narrow complex irregular tachyarrhythmia with rate 186 captured by EMS and returned to [place] spontaneously prior to arrival in ED  - She has had similar episodes of rapid palpitations and pain multiple times over the past month but this was more intense  - Continue cardiac monitoring, check  TSH, trend troponin    2. ESRD  - Completed HD on 06/16/20  - No indication for urgent HD on admission  - Continue fluid-restriction, Lasix, phosphate binder, renal-dosing of medications   3. History of CVA  - History of multiple CVAs, suspected embolic, most recent in December 2021  - She had possible mobile echodensity on aortic valve on TTE and was scheduled for outpatient TEE is planning to reschedule  - Continue statin and Brilinta, reschedule outpatient TEE    4. Hypertension  - Continue Norvasc, hydralazine, clonidine, Coreg, Lasix    5. Depression; anxiety  - Continue Lexapro and as-needed Xanax    DVT prophylaxis: sq heparin  Code Status: Full  Level of Care: Level of care: Telemetry Cardiac Family Communication: None present  Disposition Plan:  Patient is from: Home  Anticipated d/c is to: Home  Anticipated d/c date is: 06/18/20 Patient currently: Pending cardiac monitoring  Consults called: None, ED discussed with cardiology   Admission status: Observation     Vianne Bulls, MD [facility]  06/18/2020, 2:43 AM

## 2020-06-18 NOTE — Progress Notes (Addendum)
PROGRESS NOTE    Erin Dean  [id number] DOB: 12-04-1965 DOA: 06/17/2020 PCP: Lois Huxley, PA   Brief Narrative:  This 55 years old female with PMH significant for ESRD on hemodialysis, HTN, history of multiple CVAs, depression and anxiety presenting to the ED for the evaluation of chest pain.  Patient reports intermittent episodes of chest pain associated with palpitations over  last month , she had similar episode yesterday but was more intense than the previous one.  She took 2 nitro without any relief and called EMS.  She was found to be tachycardic with heart rate in 180s with rhythm strip indicating atrial fibrillation.  She was given aspirin and third nitro and was brought in the ED.  EKG shows normal sinus rhythm in the ED.  She reports being scheduled to have outpatient TEE because on recent TTE,  she was found to have a mobile echodensity on aortic valve. Troponins trending up.  Cardiology was consulted,  awaiting recommendation.  Assessment & Plan:   Principal Problem:   Atypical chest pain Active Problems:   Anxiety   Essential hypertension   Cryptogenic stroke (HCC)   ESRD (end stage renal disease) ([facility])   Arrhythmia, atrial   Elevated troponin    Chest pain; elevated troponin; transient arrhythmia  - Patient presents after an episode of chest pain associated with transient tachyarrhythmia and is found to have elevated troponin  - Narrow complex irregular tachyarrhythmia with rate 186 captured by EMS and returned to [place] spontaneously prior to arrival in ED  - She reports similar episodes of rapid palpitations,  multiple times over the past month but this was more intense  - Continue cardiac monitoring, TSH normal,  troponin trending up. - Cardiology consulted,  awaiting recommendation.   ESRD  - Completed HD on 06/16/20. - No indication for urgent HD on admission. - Continue fluid-restriction, Lasix, phosphate binder, renal-dosing of medications  - Nephro  consulted for continuation of hemodialysis.   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  - Needs to reschedule since she missed the appointment. - Continue statin and Brilinta, reschedule outpatient TEE    Hypertension  - Continue Norvasc, hydralazine, clonidine, Coreg, Lasix    Depression; anxiety  - Continue Lexapro and as-needed Xanax    DVT prophylaxis: Heparin subcu Code Status: Full code Family Communication: Family at bedside  Disposition Plan:  Status is: Observation  The patient remains OBS appropriate and will d/c before 2 midnights.  Dispo: The patient is from: Home              Anticipated d/c is to: Home              Patient currently is not medically stable to d/c.   Difficult to place patient No  Consultants:   Cardiology  Procedures: Echocardiogram Antimicrobials:   Anti-infectives (From admission, onward)   None      Subjective: Patient is seen and examined at bedside.  Overnight events noted. Patient reports feeling better, reports chest pain has improved.  Objective: Vitals:   06/18/20 1130 06/18/20 1215 06/18/20 1300 06/18/20 1345  BP: (!) 128/54 112/76 111/79 128/87  Pulse: 66 62 62 62  Resp: 18 (!) 24 (!) 22 (!) 22  Temp:      TempSrc:      SpO2: 100% 100% 100%  100%  Weight:      Height:       No intake or output data in the 24 hours ending 06/18/20 1411 Filed Weights   06/17/20 1937  Weight: 98.9 kg    Examination:  General exam: Appears calm and comfortable, not in any acute distress. Respiratory system: Clear to auscultation. Respiratory effort normal. Cardiovascular system: S1 & S2 heard, RRR. No JVD, murmurs, rubs, gallops or clicks. No pedal edema. Gastrointestinal system: Abdomen is nondistended, soft and nontender. No organomegaly or masses felt.  Normal bowel sounds  heard. Central nervous system: Alert and oriented. No focal neurological deficits. Extremities: Symmetric 5 x 5 power. Skin: No rashes, lesions or ulcers Psychiatry: Judgement and insight appear normal. Mood & affect appropriate.     Data Reviewed: I have personally reviewed following labs and imaging studies  CBC: Recent Labs  Lab 06/17/20 2020 06/18/20 0338  WBC 10.1 11.0*  NEUTROABS 7.1  --   HGB 9.5* 8.6*  HCT 29.5* 27.6*  MCV 97.4 98.9  PLT 199 [id number]   Basic Metabolic Panel: Recent Labs  Lab 06/17/20 2020 06/18/20 0338  NA 140 139  K 4.4 4.9  CL 97* 96*  CO2 27 29  GLUCOSE 111* 101*  BUN 33* 41*  CREATININE 8.61* 9.69*  CALCIUM 8.8* 8.7*   GFR: Estimated Creatinine Clearance: 8.1 mL/min (A) (by C-G formula based on SCr of 9.69 mg/dL (H)). Liver Function Tests: Recent Labs  Lab 06/17/20 2020  AST 17  ALT 11  ALKPHOS 66  BILITOT 0.2*  PROT 8.1  ALBUMIN 3.5   Recent Labs  Lab 06/17/20 2020  LIPASE 44   No results for input(s): AMMONIA in the last 168 hours. Coagulation Profile: No results for input(s): INR, PROTIME in the last 168 hours. Cardiac Enzymes: No results for input(s): CKTOTAL, CKMB, CKMBINDEX, TROPONINI in the last 168 hours. BNP (last 3 results) No results for input(s): PROBNP in the last 8760 hours. HbA1C: No results for input(s): HGBA1C in the last 72 hours. CBG: No results for input(s): GLUCAP in the last 168 hours. Lipid Profile: No results for input(s): CHOL, HDL, LDLCALC, TRIG, CHOLHDL, LDLDIRECT in the last 72 hours. Thyroid Function Tests: Recent Labs    06/18/20 0338  TSH 1.148   Anemia Panel: No results for input(s): VITAMINB12, FOLATE, FERRITIN, TIBC, IRON, RETICCTPCT in the last 72 hours. Sepsis Labs: No results for input(s): PROCALCITON, LATICACIDVEN in the last 168 hours.  No results found for this or any previous visit (from the past 240 hour(s)).   Radiology Studies: DG Chest Port 1 View  Result Date:  06/17/2020 CLINICAL DATA:  Chest pain EXAM: PORTABLE CHEST 1 VIEW COMPARISON:  March 31, 2020 FINDINGS: The cardiac silhouette is enlarged but stable from prior study. There is stable shift of the trachea to the right. There is no pneumothorax. No large pleural effusion or focal infiltrate. The previously demonstrated loop recorder is again noted. There is no acute osseous abnormality. IMPRESSION: Stable cardiomegaly. No acute cardiopulmonary process. Electronically Signed   By: Constance Holster M.D.   On: 06/17/2020 20:23   Scheduled Meds: . amLODipine  10 mg Oral Daily  . atorvastatin  80 mg Oral q1800  . calcitRIOL  0.25 mcg Oral Daily  . carvedilol  25 mg Oral BID WC  . cinacalcet  30 mg Oral Q breakfast  . cloNIDine  0.1 mg Oral TID  . escitalopram  10 mg Oral Daily  . furosemide  80 mg Oral BID  .  heparin  5,000 Units Subcutaneous Q8H  . hydrALAZINE  100 mg Oral TID  . pantoprazole  40 mg Oral Daily  . sevelamer carbonate  3,200 mg Oral TID with meals  . ticagrelor  90 mg Oral BID   Continuous Infusions:   LOS: 0 days    Time spent: 35 mins    Gildo Crisco, MD [facility]   If 7PM-7AM, please contact night-coverage

## 2020-06-18 NOTE — ED Notes (Signed)
I transferred pt to floor via gurney

## 2020-06-18 NOTE — Consult Note (Signed)
Cardiology Consultation:   Patient ID: Erin Dean MRN: [id number]; DOB: [birth year]  Admit date: 06/17/2020 Date of Consult: 06/18/2020  PCP:  Lois Huxley, [facility]  Cardiologist:  Skeet Latch, MD (2017) , Dr. Gwenlyn Found (2019), now Dr. Elias Else    Patient Profile:   Erin Dean is a 55 y.o. female with a hx of ESRD on HD, poorly controlled HTN, noncompliance, morbid obesity, chronic diastolic heart failure, and CVA (2019, 2021) who is being seen today for the evaluation of SVT and elevated troponin at the request of Dr. Dwyane Dee.  History of Present Illness:   Erin Dean has a history of difficult to control hypertension for which she has been noncompliant with medications in the past (sometimes due to inability to get her meds from pharmacy). She saw Dr. Irish Lack in 2017 who encouraged compliance. She was not see again until Jan 2019 when she presented with acute CVA and GID bleed in the setting of hypertensive emergency. Elevated troponin felt due to demand ischemia. He BP on arrival to ER was 270/140 which was treated with IV labetalol. Hemoccult positive. Pulmonary edema requiring lasix. Troponin peaked at 0.29. TEE negative for source of stroke. Echo did show preserved EF with grade 2 DD. She was discharged with ILR. She has since had AV fistula for HD. She had another stroke 03/2020 - she woke up for HD and had aphasia. She was also found to be COVID positive. Echo showed possible mobile echodensity on aortic valve left coronary cusp. TEE was deferred to outpatient setting given COVID.  It does not appear that this was completed. She has also not been seen in follow up cardiology clinic since 2019. She was being evaluated for possible kidney transplant, but no showed for appts. Does not look like she has been seen since last summer.   Last evening, she finished eating dinner and experienced a sudden onset of chest pain 9/10 that radiated to her  back. She took in total three nitro SL tablets without resolution of the pain. EMS was dispatched who noted a heart rate in the 180s that appeared consistent with SVT. She self-converted to sinus rhythm with resolution of her chest pain. She has not had another occurrence of chest pain since arrival. She has never felt this type of CP before. HS troponin have been mild but rising: 174 --> 233 --> 367.  She does report chest pressure/tightness/uncomfortableness for which she takes a nitro about one weekly which resolves her pain. She reports compliance   Past Medical History:  Diagnosis Date  . ESRD (end stage renal disease) ([place])    TTHS [facility]   . Hypertension   . Stroke ([place]) 04/2017   no residual . Limp left side  . SVT (supraventricular tachycardia) ([facility])     Past Surgical History:  Procedure Laterality Date  . AV FISTULA PLACEMENT Right 08/16/2017   Procedure: CREATION OF RADIOCEPHALIC VERSUS BRACHIOCEPHALIC ARTERIOVENOUS FISTULA RIGHT ARM;  Surgeon: Conrad Whiting, MD;  Location: [place];  Service: Vascular;  Laterality: Right;  . AV FISTULA PLACEMENT Right 02/07/2018   Procedure: Creation Right arm Brachiocephalic Fistula;  Surgeon: Marty Heck, MD;  Location: [facility] OR;  Service: Vascular;  Laterality: Right;  . AV FISTULA PLACEMENT Left 08/15/2018   Procedure: ARTERIOVENOUS (AV) FISTULA CREATION;  Surgeon: Serafina Mitchell, MD;  Location: [place];  Service: Vascular;  Laterality: Left;  . BASCILIC VEIN TRANSPOSITION Right 04/07/2018   Procedure: SECOND  STAGE BASILIC VEIN TRANSPOSITION RIGHT ARM;  Surgeon: Marty Heck, MD;  Location: [place];  Service: Vascular;  Laterality: Right;  . CESAREAN SECTION    . ESOPHAGOGASTRODUODENOSCOPY N/A 04/19/2017   Procedure: ESOPHAGOGASTRODUODENOSCOPY (EGD);  Surgeon: Carol Ada, MD;  Location: [place];  Service: Endoscopy;  Laterality: N/A;  . FISTULA SUPERFICIALIZATION Right 10/01/2017   Procedure: FISTULA SUPERFICIALIZATION RIGHT  ARM;  Surgeon: Conrad Bagley, MD;  Location: [place];  Service: Vascular;  Laterality: Right;  . FISTULA SUPERFICIALIZATION Left 10/08/2018   Procedure: FISTULA SUPERFICIALIZATION LEFT ARM;  Surgeon: Serafina Mitchell, MD;  Location: [place];  Service: Vascular;  Laterality: Left;  . IR FLUORO GUIDE CV LINE RIGHT  04/23/2018  . IR US GUIDE VASC ACCESS RIGHT  04/23/2018  . LOOP RECORDER INSERTION N/A 04/22/2017   Procedure: LOOP RECORDER INSERTION;  Surgeon: Constance Haw, MD;  Location: [facility];  Service: Cardiovascular;  Laterality: N/A;  . RADIOLOGY WITH ANESTHESIA N/A 04/01/2020   Procedure: MRI W/O CONSTRAST  WITH ANESTHESIA;  Surgeon: Radiologist, Medication, MD;  Location: [place];  Service: Radiology;  Laterality: N/A;  . TEE WITHOUT CARDIOVERSION N/A 04/19/2017   Procedure: TRANSESOPHAGEAL ECHOCARDIOGRAM (TEE);  Surgeon: Carol Ada, MD;  Location: [place];  Service: Endoscopy;  Laterality: N/A;     Home Medications:  Prior to Admission medications   Medication Sig Start Date End Date Taking? Authorizing Provider  acetaminophen (TYLENOL) 325 MG tablet Take 2 tablets (650 mg total) by mouth every 6 (six) hours as needed for mild pain (or Fever >/= 101). 04/03/20  Yes Elgergawy, Silver Huguenin, MD  ALPRAZolam Duanne Moron) 0.5 MG tablet Take 0.5 mg by mouth daily as needed for anxiety.  07/16/18  Yes [provider]  amLODipine (NORVASC) 10 MG tablet Take 1 tablet (10 mg total) by mouth daily. 04/05/20  Yes Elgergawy, Silver Huguenin, MD  atorvastatin (LIPITOR) 80 MG tablet Take 1 tablet (80 mg total) by mouth daily at 6 PM. 04/03/20  Yes Elgergawy, Silver Huguenin, MD  calcitRIOL (ROCALTROL) 0.25 MCG capsule Take 1 capsule (0.25 mcg total) by mouth daily. 03/11/18  Yes Donzetta Starch, NP  carvedilol (COREG) 25 MG tablet Take 1 tablet (25 mg total) by mouth 2 (two) times daily with a meal. 03/11/18  Yes Donzetta Starch, NP  Cinacalcet HCl (SENSIPAR PO) Take 1 tablet by mouth daily. 10/31/19 10/29/20  Yes [provider]  cloNIDine (CATAPRES) 0.1 MG tablet Take 1 tablet (0.1 mg total) by mouth 3 (three) times daily. 04/07/20  Yes Elgergawy, Silver Huguenin, MD  cyanocobalamin 1000 MCG tablet Take 1 tablet (1,000 mcg total) by mouth daily. 03/11/18  Yes Donzetta Starch, NP  escitalopram (LEXAPRO) 10 MG tablet Take 1 tablet (10 mg total) by mouth daily. 03/11/18  Yes Donzetta Starch, NP  ferrous sulfate 325 (65 FE) MG tablet Take 1 tablet (325 mg total) by mouth 3 (three) times daily with meals. 03/11/18  Yes Donzetta Starch, NP  furosemide (LASIX) 80 MG tablet Take 1 tablet (80 mg total) by mouth 2 (two) times daily. Take with 40 mg to equal 120 mg twice daily Patient taking differently: Take 80 mg by mouth 2 (two) times daily. 03/11/18  Yes Donzetta Starch, NP  hydrALAZINE (APRESOLINE) 100 MG tablet Take 1 tablet (100 mg total) by mouth 3 (three) times daily. 04/09/20  Yes Elgergawy, Silver Huguenin, MD  Meclizine HCl 25 MG CHEW Chew 25 mg by mouth 3 (three) times daily  as needed.   Yes [provider]  multivitamin (RENA-VIT) TABS tablet Take 1 tablet by mouth daily.  05/01/18  Yes [provider]  nitroGLYCERIN (NITROSTAT) 0.4 MG SL tablet Place 0.4 mg under the tongue every 5 (five) minutes x 3 doses as needed for chest pain. 05/18/20  Yes [provider]  omeprazole (PRILOSEC) 20 MG capsule Take 20 mg by mouth daily. 02/12/19  Yes [provider]  RENAGEL 800 MG tablet Take 3,200 mg by mouth See admin instructions. Take 3200 mg with each meal and snack 07/23/18  Yes [provider]  ticagrelor (BRILINTA) 90 MG TABS tablet Take 1 tablet (90 mg total) by mouth 2 (two) times daily. 04/03/20  Yes Elgergawy, Silver Huguenin, MD    Inpatient Medications: Scheduled Meds: . amLODipine  10 mg Oral Daily  . atorvastatin  80 mg Oral q1800  . calcitRIOL  0.25 mcg Oral Daily  . carvedilol  25 mg Oral BID WC  . cinacalcet  30 mg Oral Q breakfast  . cloNIDine  0.1 mg Oral TID  .  escitalopram  10 mg Oral Daily  . furosemide  80 mg Oral BID  . heparin  5,000 Units Subcutaneous Q8H  . hydrALAZINE  100 mg Oral TID  . pantoprazole  40 mg Oral Daily  . sevelamer carbonate  3,200 mg Oral TID with meals  . ticagrelor  90 mg Oral BID   Continuous Infusions:  PRN Meds: acetaminophen **OR** acetaminophen, ALPRAZolam, ondansetron **OR** ondansetron (ZOFRAN) IV  Allergies:    Allergies  Allergen Reactions  . Chlorhexidine Gluconate Rash    Social History:   Social History   Socioeconomic History  . Marital status: Legally Separated    Spouse name: Not on file  . Number of children: Not on file  . Years of education: Not on file  . Highest education level: Not on file  Occupational History  . Not on file  Tobacco Use  . Smoking status: Never Smoker  . Smokeless tobacco: Never Used  Vaping Use  . Vaping Use: Never used  Substance and Sexual Activity  . Alcohol use: No  . Drug use: No  . Sexual activity: Not on file  Other Topics Concern  . Not on file  Social History Narrative   Lives with daughter   Education- high school   On temp disability   Social Determinants of Health   Financial Resource Strain: Not on file  Food Insecurity: Not on file  Transportation Needs: Not on file  Physical Activity: Not on file  Stress: Not on file  Social Connections: Not on file  Intimate Partner Violence: Not on file    Family History:    Family History  Problem Relation Age of Onset  . Heart attack Maternal Uncle   . Heart attack Maternal Grandfather   . Stroke Paternal Grandmother   . Cancer Father   . Hypertension Sister      ROS:  Please see the history of present illness.   All other ROS reviewed and negative.     Physical Exam/Data:   Vitals:   06/18/20 1300 06/18/20 1345 06/18/20 1418 06/18/20 1500  BP: 111/79 128/87 120/60 133/68  Pulse: 62 62 60 70  Resp: (!) 22 (!) '22 19 16  '$ Temp:   98 F (36.7 C) 98 F (36.7 C)  TempSrc:   Oral  Oral  SpO2: 100% 100% 100% 93%  Weight:      Height:  No intake or output data in the 24 hours ending 06/18/20 1712 Last 3 Weights 06/17/2020 04/02/2020 04/01/2020  Weight (lbs) 218 lb 251 lb 1.7 oz 248 lb 4.9 oz  Weight (kg) 98.884 kg 113.9 kg 112.63 kg     Body mass index is 33.15 kg/m.  General:  Obese female nad HEENT: normal Neck: no JVD Vascular: No carotid bruits Cardiac:  normal S1, S2; RRR; no murmur  Lungs:  clear to auscultation bilaterally, no wheezing, rhonchi or rales  Abd: soft, nontender, no hepatomegaly  Ext: no edema Musculoskeletal:  No deformities, BUE and BLE strength normal and equal Skin: warm and dry  Neuro:  CNs 2-12 intact, no focal abnormalities noted Psych:  Normal affect   EKG:  The EKG was personally reviewed and demonstrates: NSR HR 95, TWI AVL and V2  Telemetry:  Telemetry was personally reviewed and demonstrates:  Sinus rhythm, PVCs, one bout of rapid heart rate that appears to be Afib  Relevant CV Studies:  Echo 04/02/20: 1. When compared to the prior study from 03/10/2018 aortc valve is now  thicker with a possible mobile echodensity attached to the left coronary  cusp. A TEE is recommended for further evaluation.  2. Left ventricular ejection fraction, by estimation, is 65 to 70%. The  left ventricle has normal function. The left ventricle has no regional  wall motion abnormalities. Left ventricular diastolic parameters are  consistent with Grade II diastolic  dysfunction (pseudonormalization). Elevated left atrial pressure.  3. Right ventricular systolic function is normal. The right ventricular  size is normal. There is mildly elevated pulmonary artery systolic  pressure.  4. Left atrial size was mildly dilated.  5. The mitral valve is normal in structure. No evidence of mitral valve  regurgitation. No evidence of mitral stenosis.  6. The aortic valve is normal in structure. There is mild calcification  of the aortic valve.  There is severe thickening of the aortic valve.  Aortic valve regurgitation is mild. No aortic stenosis is present.  7. The inferior vena cava is normal in size with greater than 50%  respiratory variability, suggesting right atrial pressure of 3 mmHg.   Laboratory Data:  High Sensitivity Troponin:   Recent Labs  Lab 06/17/20 2020 06/17/20 2225 06/18/20 0338  TROPONINIHS 174* 233* 367*     Chemistry Recent Labs  Lab 06/17/20 2020 06/18/20 0338  NA 140 139  K 4.4 4.9  CL 97* 96*  CO2 27 29  GLUCOSE 111* 101*  BUN 33* 41*  CREATININE 8.61* 9.69*  CALCIUM 8.8* 8.7*  GFRNONAA 5* 4*  ANIONGAP 16* 14    Recent Labs  Lab 06/17/20 2020  PROT 8.1  ALBUMIN 3.5  AST 17  ALT 11  ALKPHOS 66  BILITOT 0.2*   Hematology Recent Labs  Lab 06/17/20 2020 06/18/20 0338  WBC 10.1 11.0*  RBC 3.03* 2.79*  HGB 9.5* 8.6*  HCT 29.5* 27.6*  MCV 97.4 98.9  MCH 31.4 30.8  MCHC 32.2 31.2  RDW 14.6 14.6  PLT 199 186   BNPNo results for input(s): BNP, PROBNP in the last 168 hours.  DDimer No results for input(s): DDIMER in the last 168 hours.   Radiology/Studies:  DG Chest Port 1 View  Result Date: 06/17/2020 CLINICAL DATA:  Chest pain EXAM: PORTABLE CHEST 1 VIEW COMPARISON:  March 31, 2020 FINDINGS: The cardiac silhouette is enlarged but stable from prior study. There is stable shift of the trachea to the right. There is no pneumothorax. No  large pleural effusion or focal infiltrate. The previously demonstrated loop recorder is again noted. There is no acute osseous abnormality. IMPRESSION: Stable cardiomegaly. No acute cardiopulmonary process. Electronically Signed   By: Constance Holster M.D.   On: 06/17/2020 20:23     Assessment and Plan:   Paroxysmal Atrial fibrillation with RVR Need for chronic anticoagulation - strips pulled from EMS appear to be Afib - EKG appears sinus with TWI AVL and V2 - TSH WNL - ILR interrogation received and shows episodes of Afib RVR -  will start heparin drip for anticoagulation - she is already on max dose coreg, may need to transition to metoprolol to control PAF   Chest pain - hs troponin 174 --> 233 --> 367 - she has had no further chest pain since converting to sinus rhythm - EKG does have TWI - CE trending up and EKG with changes from prior, will plan for heart cath on Tues   Hypertension - BP controlled now that she is out of chest pain and maintaining sinus rhythm - continue home medications - she does report her BP runs in the 170-180s at home - she reports compliance with her medications - 10 mg amlodipine, 25 mg coreg BID, 0.1 mg clonidine TID, lasix 80 mg BID, 100 mg hydralazine TID   Aortic valve vegetation - see on TTE during last admission - TEE was not completed - will plan for TEE on Monday   Chronic diastolic heart failure - TEE pending - continue diuretic - cautious with IVF   Hx of CVA (2019, 2021) - was placed on brilinta at discharge following her second stroke, plavix discontinued    Anemia of chronic disease ESRD on HD - TTS - per primary     Risk Assessment/Risk Scores:     TIMI Risk Score for Unstable Angina or Non-ST Elevation MI:   The patient's TIMI risk score is 2, which indicates a 8% risk of all cause mortality, new or recurrent myocardial infarction or need for urgent revascularization in the next 14 days.    CHA2DS2-VASc Score = 5  This indicates a 7.2% annual risk of stroke. The patient's score is based upon: CHF History: Yes HTN History: Yes Diabetes History: No Stroke History: Yes Vascular Disease History: No Age Score: 0 Gender Score: 1       For questions or updates, please contact [place] Please consult [email or web address] for contact info under    Signed, Ledora Bottcher, [place]  06/18/2020 5:12 PM

## 2020-06-18 NOTE — ED Notes (Signed)
Cardiac PA, Angie in with pt

## 2020-06-18 NOTE — Progress Notes (Signed)
Erin Dean for heparin Indication: atrial fibrillation  Allergies  Allergen Reactions  . Chlorhexidine Gluconate Rash    Patient Measurements: Height: '5\' 8"'$  (172.7 cm) Weight: 98.9 kg (218 lb) IBW/kg (Calculated) : 63.9 Heparin Dosing Weight: 85kg  Vital Signs: Temp: 98 F (36.7 C) (03/12 1500) Temp Source: Oral (03/12 1500) BP: 133/68 (03/12 1500) Pulse Rate: 70 (03/12 1500)  Labs: Recent Labs    06/17/20 2020 06/17/20 2225 06/18/20 0338  HGB 9.5*  --  8.6*  HCT 29.5*  --  27.6*  PLT 199  --  186  CREATININE 8.61*  --  9.69*  TROPONINIHS 174* 233* 367*    Estimated Creatinine Clearance: 8.1 mL/min (A) (by C-G formula based on SCr of 9.69 mg/dL (H)).   Medical History: Past Medical History:  Diagnosis Date  . ESRD (end stage renal disease) ([place])    [facility]   . Hypertension   . Stroke ([place]) 04/2017   no residual . Limp left side  . SVT (supraventricular tachycardia) ([facility])     Assessment: 56 yoF admitted with AFib RVR. No AC PTA, SQ heparin given this morning, H/H low but at pt baseline. Pharmacy to begin heparin infusion.  Goal of Therapy:  Heparin level 0.3-0.7 units/ml Monitor platelets by anticoagulation protocol: Yes   Plan:  -Heparin 4000 units x1 -Heparin 1250 units/hr -Check 8-hr heparin level -Monitor heparin level, CBC, S/Sx bleeding daily  Arrie Senate, PharmD, BCPS, [facility] Clinical Pharmacist [phone] Please check AMION for all [facility] numbers 06/18/2020

## 2020-06-19 ENCOUNTER — Inpatient Hospital Stay (HOSPITAL_COMMUNITY): Payer: Medicare Other

## 2020-06-19 DIAGNOSIS — R079 Chest pain, unspecified: Secondary | ICD-10-CM

## 2020-06-19 DIAGNOSIS — R0789 Other chest pain: Secondary | ICD-10-CM | POA: Diagnosis not present

## 2020-06-19 DIAGNOSIS — I358 Other nonrheumatic aortic valve disorders: Secondary | ICD-10-CM

## 2020-06-19 LAB — HEPARIN LEVEL (UNFRACTIONATED)
Heparin Unfractionated: 0.85 IU/mL — ABNORMAL HIGH (ref 0.30–0.70)
Heparin Unfractionated: 0.47 IU/mL (ref 0.30–0.70)

## 2020-06-19 LAB — CBC
HCT: 29.7 % — ABNORMAL LOW (ref 36.0–46.0)
Hemoglobin: 9.2 g/dL — ABNORMAL LOW (ref 12.0–15.0)
MCH: 30.3 pg (ref 26.0–34.0)
MCHC: 31 g/dL (ref 30.0–36.0)
MCV: 97.7 fL (ref 80.0–100.0)
Platelets: 228 10*3/uL (ref 150–400)
RBC: 3.04 MIL/uL — ABNORMAL LOW (ref 3.87–5.11)
RDW: 15.1 % (ref 11.5–15.5)
WBC: 13.1 10*3/uL — ABNORMAL HIGH (ref 4.0–10.5)
nRBC: 0 % (ref 0.0–0.2)

## 2020-06-19 LAB — NM MYOCAR MULTI W/SPECT W/WALL MOTION / EF
MPHR: 165 {beats}/min
Peak HR: 120 {beats}/min
Percent HR: 72 %
Rest HR: 54 {beats}/min

## 2020-06-19 LAB — ECHOCARDIOGRAM LIMITED
Area-P 1/2: 3.17 cm2
Height: 68 in
S' Lateral: 3.2 cm
Weight: 3488 oz

## 2020-06-19 MED ORDER — ALTEPLASE 2 MG IJ SOLR: 2.0000 mg | Freq: Once | INTRAMUSCULAR | Status: DC | PRN

## 2020-06-19 MED ORDER — LIDOCAINE-PRILOCAINE 2.5-2.5 % EX CREA: 1.0000 "application " | TOPICAL_CREAM | CUTANEOUS | Status: DC | PRN

## 2020-06-19 MED ORDER — TECHNETIUM TC 99M TETROFOSMIN IV KIT
33.0000 | PACK | Freq: Once | INTRAVENOUS | Status: AC | PRN
  Administered 2020-06-19: 33 via INTRAVENOUS

## 2020-06-19 MED ORDER — SODIUM CHLORIDE 0.9 % IV SOLN: 100.0000 mL | INTRAVENOUS | Status: DC | PRN

## 2020-06-19 MED ORDER — PENTAFLUOROPROP-TETRAFLUOROETH EX AERO: 1.0000 "application " | INHALATION_SPRAY | CUTANEOUS | Status: DC | PRN

## 2020-06-19 MED ORDER — REGADENOSON 0.4 MG/5ML IV SOLN
INTRAVENOUS | Status: AC
  Administered 2020-06-19: 0.4 mg via INTRAVENOUS
  Filled 2020-06-19: qty 5

## 2020-06-19 MED ORDER — REGADENOSON 0.4 MG/5ML IV SOLN
0.4000 mg | Freq: Once | INTRAVENOUS | Status: AC
  Filled 2020-06-19: qty 5

## 2020-06-19 MED ORDER — HEPARIN SODIUM (PORCINE) 1000 UNIT/ML DIALYSIS: 3000.0000 [IU] | Freq: Once | INTRAMUSCULAR | Status: DC

## 2020-06-19 MED ORDER — HEPARIN SODIUM (PORCINE) 1000 UNIT/ML DIALYSIS: 1000.0000 [IU] | INTRAMUSCULAR | Status: DC | PRN

## 2020-06-19 MED ORDER — LIDOCAINE HCL (PF) 1 % IJ SOLN: 5.0000 mL | INTRAMUSCULAR | Status: DC | PRN

## 2020-06-19 MED ORDER — SODIUM CHLORIDE 0.9 % IV SOLN: INTRAVENOUS | Status: DC

## 2020-06-19 MED ORDER — TECHNETIUM TC 99M TETROFOSMIN IV KIT
11.0000 | PACK | Freq: Once | INTRAVENOUS | Status: AC | PRN
  Administered 2020-06-19: 11 via INTRAVENOUS

## 2020-06-19 NOTE — Consult Note (Signed)
[facility] Renal Consultation Note    Indication for Consultation:  Management of ESRD/hemodialysis; anemia, hypertension/volume and secondary hyperparathyroidism  HPI: Erin Dean is a 55 y.o. female with ESRD on HD TTS, HTN, dCHF, multiple prior CVAs.  Admitted for evaluation of chest pain. Reports intermittent episodes of chest pain associated with palpitations over  last month , she had similar episode Friday and  took 2 nitro without any relief and called EMS.  She was found to be tachycardic with heart rate in 180s with rhythm strip indicating atrial fibrillation.  She was given aspirin and third nitro and was brought in the ED. EKG shows normal sinus rhythm in the ED.  She reports being scheduled to have outpatient TEE because on recent TTE,  she was found to have a mobile echodensity on aortic valve. Troponins trending up.  Cardiology consulted. Plan for TEE.   Asked to see for dialysis needs. Usual dialysis TTS at [facility]. Dialyzed in hospital last evening without issue.   Seen and examined at bedside. Feels much better. Had stress test today. No chest pain since admission. Denies sob, orthopnea, n/v, edema.   Past Medical History:  Diagnosis Date  . ESRD (end stage renal disease) ([place])    TTHS [place]   . Hypertension   . Stroke ([place]) 04/2017   no residual . Limp left side  . SVT (supraventricular tachycardia) (HCC)    Past Surgical History:  Procedure Laterality Date  . AV FISTULA PLACEMENT Right 08/16/2017   Procedure: CREATION OF RADIOCEPHALIC VERSUS BRACHIOCEPHALIC ARTERIOVENOUS FISTULA RIGHT ARM;  Surgeon: Conrad Wauseon, MD;  Location: [place];  Service: Vascular;  Laterality: Right;  . AV FISTULA PLACEMENT Right 02/07/2018   Procedure: Creation Right arm Brachiocephalic Fistula;  Surgeon: Marty Heck, MD;  Location: [facility] OR;  Service: Vascular;  Laterality: Right;  . AV FISTULA PLACEMENT Left 08/15/2018   Procedure: ARTERIOVENOUS (AV)  FISTULA CREATION;  Surgeon: Serafina Mitchell, MD;  Location: [place];  Service: Vascular;  Laterality: Left;  . BASCILIC VEIN TRANSPOSITION Right 04/07/2018   Procedure: SECOND STAGE BASILIC VEIN TRANSPOSITION RIGHT ARM;  Surgeon: Marty Heck, MD;  Location: [place];  Service: Vascular;  Laterality: Right;  . CESAREAN SECTION    . ESOPHAGOGASTRODUODENOSCOPY N/A 04/19/2017   Procedure: ESOPHAGOGASTRODUODENOSCOPY (EGD);  Surgeon: Carol Ada, MD;  Location: [place];  Service: Endoscopy;  Laterality: N/A;  . FISTULA SUPERFICIALIZATION Right 10/01/2017   Procedure: FISTULA SUPERFICIALIZATION RIGHT ARM;  Surgeon: Conrad Delaware, MD;  Location: [place];  Service: Vascular;  Laterality: Right;  . FISTULA SUPERFICIALIZATION Left 10/08/2018   Procedure: FISTULA SUPERFICIALIZATION LEFT ARM;  Surgeon: Serafina Mitchell, MD;  Location: [place];  Service: Vascular;  Laterality: Left;  . IR FLUORO GUIDE CV LINE RIGHT  04/23/2018  . IR US GUIDE VASC ACCESS RIGHT  04/23/2018  . LOOP RECORDER INSERTION N/A 04/22/2017   Procedure: LOOP RECORDER INSERTION;  Surgeon: Constance Haw, MD;  Location: [place] CV [facility];  Service: Cardiovascular;  Laterality: N/A;  . RADIOLOGY WITH ANESTHESIA N/A 04/01/2020   Procedure: MRI W/O CONSTRAST  WITH ANESTHESIA;  Surgeon: Radiologist, Medication, MD;  Location: [place];  Service: Radiology;  Laterality: N/A;  . TEE WITHOUT CARDIOVERSION N/A 04/19/2017   Procedure: TRANSESOPHAGEAL ECHOCARDIOGRAM (TEE);  Surgeon: Carol Ada, MD;  Location: [place];  Service: Endoscopy;  Laterality: N/A;   Family History  Problem Relation Age of Onset  . Heart attack Maternal Uncle   .  Heart attack Maternal Grandfather   . Stroke Paternal Grandmother   . Cancer Father   . Hypertension Sister    Social History:  reports that she has never smoked. She has never used smokeless tobacco. She reports that she does not drink alcohol and does not use drugs. Allergies  Allergen  Reactions  . Chlorhexidine Gluconate Rash   Prior to Admission medications   Medication Sig Start Date End Date Taking? Authorizing Provider  acetaminophen (TYLENOL) 325 MG tablet Take 2 tablets (650 mg total) by mouth every 6 (six) hours as needed for mild pain (or Fever >/= 101). 04/03/20  Yes Elgergawy, Silver Huguenin, MD  ALPRAZolam Duanne Moron) 0.5 MG tablet Take 0.5 mg by mouth daily as needed for anxiety.  07/16/18  Yes [provider]  amLODipine (NORVASC) 10 MG tablet Take 1 tablet (10 mg total) by mouth daily. 04/05/20  Yes Elgergawy, Silver Huguenin, MD  atorvastatin (LIPITOR) 80 MG tablet Take 1 tablet (80 mg total) by mouth daily at 6 PM. 04/03/20  Yes Elgergawy, Silver Huguenin, MD  calcitRIOL (ROCALTROL) 0.25 MCG capsule Take 1 capsule (0.25 mcg total) by mouth daily. 03/11/18  Yes Donzetta Starch, NP  carvedilol (COREG) 25 MG tablet Take 1 tablet (25 mg total) by mouth 2 (two) times daily with a meal. 03/11/18  Yes Donzetta Starch, NP  Cinacalcet HCl (SENSIPAR PO) Take 1 tablet by mouth daily. 10/31/19 10/29/20 Yes [provider]  cloNIDine (CATAPRES) 0.1 MG tablet Take 1 tablet (0.1 mg total) by mouth 3 (three) times daily. 04/07/20  Yes Elgergawy, Silver Huguenin, MD  cyanocobalamin 1000 MCG tablet Take 1 tablet (1,000 mcg total) by mouth daily. 03/11/18  Yes Donzetta Starch, NP  escitalopram (LEXAPRO) 10 MG tablet Take 1 tablet (10 mg total) by mouth daily. 03/11/18  Yes Donzetta Starch, NP  ferrous sulfate 325 (65 FE) MG tablet Take 1 tablet (325 mg total) by mouth 3 (three) times daily with meals. 03/11/18  Yes Donzetta Starch, NP  furosemide (LASIX) 80 MG tablet Take 1 tablet (80 mg total) by mouth 2 (two) times daily. Take with 40 mg to equal 120 mg twice daily Patient taking differently: Take 80 mg by mouth 2 (two) times daily. 03/11/18  Yes Donzetta Starch, NP  hydrALAZINE (APRESOLINE) 100 MG tablet Take 1 tablet (100 mg total) by mouth 3 (three) times daily. 04/09/20  Yes Elgergawy, Silver Huguenin, MD   Meclizine HCl 25 MG CHEW Chew 25 mg by mouth 3 (three) times daily as needed.   Yes [provider]  multivitamin (RENA-VIT) TABS tablet Take 1 tablet by mouth daily.  05/01/18  Yes [provider]  nitroGLYCERIN (NITROSTAT) 0.4 MG SL tablet Place 0.4 mg under the tongue every 5 (five) minutes x 3 doses as needed for chest pain. 05/18/20  Yes [provider]  omeprazole (PRILOSEC) 20 MG capsule Take 20 mg by mouth daily. 02/12/19  Yes [provider]  RENAGEL 800 MG tablet Take 3,200 mg by mouth See admin instructions. Take 3200 mg with each meal and snack 07/23/18  Yes [provider]  ticagrelor (BRILINTA) 90 MG TABS tablet Take 1 tablet (90 mg total) by mouth 2 (two) times daily. 04/03/20  Yes Elgergawy, Silver Huguenin, MD   Current Facility-Administered Medications  Medication Dose Route Frequency Provider Last Rate Last Admin  . 0.9 %  sodium chloride infusion  100 mL Intravenous PRN Lynnda Child, PA-C      .  0.9 %  sodium chloride infusion  100 mL Intravenous PRN Lynnda Child, PA-C      . [START ON 06/20/2020] 0.9 %  sodium chloride infusion   Intravenous Continuous [place], Tami Lin, PA      . acetaminophen (TYLENOL) tablet 650 mg  650 mg Oral Q6H PRN Opyd, Ilene Qua, MD       Or  . acetaminophen (TYLENOL) suppository 650 mg  650 mg Rectal Q6H PRN Opyd, Ilene Qua, MD      . ALPRAZolam Duanne Moron) tablet 0.5 mg  0.5 mg Oral Daily PRN Opyd, Ilene Qua, MD      . alteplase (CATHFLO ACTIVASE) injection 2 mg  2 mg Intracatheter Once PRN Lynnda Child, PA-C      . amLODipine (NORVASC) tablet 10 mg  10 mg Oral Daily Opyd, Ilene Qua, MD   10 mg at 06/18/20 0906  . atorvastatin (LIPITOR) tablet 80 mg  80 mg Oral q1800 Vianne Bulls, MD   80 mg at 06/18/20 1636  . calcitRIOL (ROCALTROL) capsule 0.25 mcg  0.25 mcg Oral Daily Opyd, Ilene Qua, MD   0.25 mcg at 06/18/20 0906  . carvedilol (COREG) tablet 25 mg  25 mg Oral BID WC Opyd, Ilene Qua,  MD   25 mg at 06/18/20 1636  . cinacalcet (SENSIPAR) tablet 30 mg  30 mg Oral Q breakfast Opyd, Ilene Qua, MD   30 mg at 06/18/20 0904  . cloNIDine (CATAPRES) tablet 0.1 mg  0.1 mg Oral TID Vianne Bulls, MD   0.1 mg at 06/18/20 1637  . escitalopram (LEXAPRO) tablet 10 mg  10 mg Oral Daily Opyd, Ilene Qua, MD   10 mg at 06/18/20 0929  . furosemide (LASIX) tablet 80 mg  80 mg Oral BID Vianne Bulls, MD   80 mg at 06/18/20 1636  . heparin ADULT infusion 100 units/mL (25000 units/23m)  1,050 Units/hr Intravenous Continuous KRichardine Service RPH 10.5 mL/hr at 06/19/20 1345 1,050 Units/hr at 06/19/20 1345  . heparin injection 1,000 Units  1,000 Units Dialysis PRN ELynnda Child PA-C      . heparin injection 3,000 Units  3,000 Units Dialysis Once in dialysis ELynnda Child PA-C      . hydrALAZINE (APRESOLINE) tablet 100 mg  100 mg Oral TID OVianne Bulls MD   100 mg at 06/18/20 1637  . lidocaine (PF) (XYLOCAINE) 1 % injection 5 mL  5 mL Intradermal PRN Ejigiri, OThomos Lemons PA-C      . lidocaine-prilocaine (EMLA) cream 1 application  1 application Topical PRN Ejigiri, OThomos Lemons PA-C      . ondansetron (ZOFRAN) tablet 4 mg  4 mg Oral Q6H PRN Opyd, TIlene Qua MD       Or  . ondansetron (ZOFRAN) injection 4 mg  4 mg Intravenous Q6H PRN Opyd, TIlene Qua MD      . pantoprazole (PROTONIX) EC tablet 40 mg  40 mg Oral Daily Opyd, TIlene Qua MD   40 mg at 06/18/20 0906  . pentafluoroprop-tetrafluoroeth (GEBAUERS) aerosol 1 application  1 application Topical PRN Ejigiri, OThomos Lemons PA-C      . sevelamer carbonate (RENVELA) tablet 3,200 mg  3,200 mg Oral TID with meals Opyd, TIlene Qua MD   3,200 mg at 06/18/20 1826  . ticagrelor (BRILINTA) tablet 90 mg  90 mg Oral BID Opyd, TIlene Qua MD   90 mg at 06/18/20 0906     ROS: As per HPI otherwise negative.  Physical  Exam: Vitals:   06/19/20 1000 06/19/20 1229 06/19/20 1240 06/19/20 1242  BP: 124/78 (!) 134/59 (!) 151/81 (!) 185/105   Pulse: (!) 57     Resp: 14 (!) 25    Temp: 98.4 F (36.9 C)     TempSrc: Oral     SpO2: 96%     Weight:      Height:         General: WDWN NAD Head: NCAT sclera not icteric MMM Neck: Supple. No JVD No masses Lungs: CTA bilaterally without wheezes, rales, or rhonchi. Breathing is unlabored. Heart: RRR with S1 S2 Abdomen: soft NT + BS Lower extremities:without edema or ischemic changes, no open wounds  Neuro: A & O  X 3. Moves all extremities spontaneously. Psych:  Responds to questions appropriately with a normal affect. Dialysis Access: LUE AVF +bruit   Labs: Basic Metabolic Panel: Recent Labs  Lab 06/17/20 2020 06/18/20 0338  NA 140 139  K 4.4 4.9  CL 97* 96*  CO2 27 29  GLUCOSE 111* 101*  BUN 33* 41*  CREATININE 8.61* 9.69*  CALCIUM 8.8* 8.7*   Liver Function Tests: Recent Labs  Lab 06/17/20 2020  AST 17  ALT 11  ALKPHOS 66  BILITOT 0.2*  PROT 8.1  ALBUMIN 3.5   Recent Labs  Lab 06/17/20 2020  LIPASE 44   No results for input(s): AMMONIA in the last 168 hours. CBC: Recent Labs  Lab 06/17/20 2020 06/18/20 0338 06/19/20 0741  WBC 10.1 11.0* 13.1*  NEUTROABS 7.1  --   --   HGB 9.5* 8.6* 9.2*  HCT 29.5* 27.6* 29.7*  MCV 97.4 98.9 97.7  PLT 199 186 228   Cardiac Enzymes: No results for input(s): CKTOTAL, CKMB, CKMBINDEX, TROPONINI in the last 168 hours. CBG: No results for input(s): GLUCAP in the last 168 hours. Iron Studies: No results for input(s): IRON, TIBC, TRANSFERRIN, FERRITIN in the last 72 hours. Studies/Results: NM Myocar Multi W/Spect W/Wall Motion / EF  Result Date: 06/19/2020 CLINICAL DATA:  Chest pain EXAM: MYOCARDIAL IMAGING WITH SPECT (REST AND PHARMACOLOGIC-STRESS) GATED LEFT VENTRICULAR WALL MOTION STUDY LEFT VENTRICULAR EJECTION FRACTION TECHNIQUE: Standard myocardial SPECT imaging was performed after resting intravenous injection of 11 mCi Tc-11mtetrofosmin. Subsequently, intravenous infusion of Lexiscan was performed under  the supervision of the Cardiology staff. At peak effect of the drug, 33 mCi Tc-965metrofosmin was injected intravenously and standard myocardial SPECT imaging was performed. Quantitative gated imaging was also performed to evaluate left ventricular wall motion, and estimate left ventricular ejection fraction. COMPARISON:  None. FINDINGS: Perfusion: There is a fixed defect in the inferior wall extending from mid wall the apex. There is a mildly reversible defect accounting for approximately 10% of left ventricular wall affecting the anterior septal wall. No other perfusion abnormalities. Wall Motion: Global hypokinesis. No wall thickening in the region of the inferior wall fixed defect. Left Ventricular Ejection Fraction: 44 % End diastolic volume [id number] End systolic volume 91 ml IMPRESSION: 1. There is an infarct in the inferior wall extending from mid wall to apex. There is a moderate size reversible defect in the anterior septal wall towards the apex. 2. Global hypokinesis. No wall thickening in the region of infarct. Severely abnormal end-diastolic volume and end systolic volume. 3. Left ventricular ejection fraction 44% 4. Non invasive risk stratification*: High *2012 Appropriate Use Criteria for Coronary Revascularization Focused Update: J Am Coll Cardiol. [id number]://content.onairportbarriers.comspx?articleid=1201161 These results will be called to the ordering clinician or representative by  the Psychologist, clinical, and communication documented in the PACS or [employer]. Electronically Signed   By: Dorise Bullion III M.D   On: 06/19/2020 14:15   DG Chest Port 1 View  Result Date: 06/17/2020 CLINICAL DATA:  Chest pain EXAM: PORTABLE CHEST 1 VIEW COMPARISON:  March 31, 2020 FINDINGS: The cardiac silhouette is enlarged but stable from prior study. There is stable shift of the trachea to the right. There is no pneumothorax. No large pleural effusion or focal infiltrate. The  previously demonstrated loop recorder is again noted. There is no acute osseous abnormality. IMPRESSION: Stable cardiomegaly. No acute cardiopulmonary process. Electronically Signed   By: Constance Holster M.D.   On: 06/17/2020 20:23    Dialysis Orders:  [facility] TTS  4h 400/500 EDW 108 kg 2K/2Ca UFP 4  AVF Heparin 3000  Sensipar 180 TIW, Calcitriol 3.75 TIW, Mircera 60 q 2 wks (last 3/10)   Assessment/Plan: 1. Chest pain/elevated troponin --Cardiology following. Concern for aortic valve vegetation on outpatient Echo. Plan for [place] and TEE  2. AFib - with RVR on admission. Controlled. On Coreg 25 bid. Heparin per pharmacy.  3. ESRD -  HD TTS. Continue on schedule. Next HD 3/15.  4. Hypertension/volume  - Variable BP.Continue home meds, Coreg, Norvasc, hydralazine. No gross volume excess on exam. Net UF 2L on 3/12. Below EDW by weights here. Follow.  5. Anemia  - Hgb 9.2 Recent ESA dose as outpatient. Follow trends  6. Metabolic bone disease -  Continue binders, Sensipar, Calcitriol. Follow trends here.  7. Nutrition - Renal diet with fluid restriction  8. Hx prior CVA   Lynnda Child PA-C [place] Pager [phone] 06/19/2020, 3:10 PM

## 2020-06-19 NOTE — Progress Notes (Signed)
[place] for heparin Indication: atrial fibrillation  Allergies  Allergen Reactions  . Chlorhexidine Gluconate Rash    Patient Measurements: Height: '5\' 8"'$  (172.7 cm) Weight: 98.9 kg (218 lb) IBW/kg (Calculated) : 63.9 Heparin Dosing Weight: 85kg  Vital Signs: Temp: 98.3 F (36.8 C) (03/13 1900) Temp Source: Oral (03/13 1900) BP: 115/73 (03/13 1900) Pulse Rate: 66 (03/13 1900)  Labs: Recent Labs    06/17/20 2020 06/17/20 2225 06/18/20 0338 06/19/20 0741 06/19/20 1957  HGB 9.5*  --  8.6* 9.2*  --   HCT 29.5*  --  27.6* 29.7*  --   PLT 199  --  186 228  --   HEPARINUNFRC  --   --   --  0.85* 0.47  CREATININE 8.61*  --  9.69*  --   --   TROPONINIHS 174* 233* 367*  --   --     Estimated Creatinine Clearance: 8.1 mL/min (A) (by C-G formula based on SCr of 9.69 mg/dL (H)).   Medical History: Past Medical History:  Diagnosis Date  . ESRD (end stage renal disease) ([place])    TTHS [place]   . Hypertension   . Stroke ([place]) 04/2017   no residual . Limp left side  . SVT (supraventricular tachycardia) (H[facility])     Assessment: 69 yoF admitted with AFib RVR. No AC PTA. Pharmacy to begin heparin infusion.  Heparin level therapeutic at 0.47, CBC stable this morning.  Goal of Therapy:  Heparin level 0.3-0.7 units/ml Monitor platelets by anticoagulation protocol: Yes   Plan:  Continue heparin 1050 units/h Daily heparin level and CBC  Arrie Senate, PharmD, [place], [facility] Clinical Pharmacist [phone] Please check AMION for all [facility] Pharmacy numbers 06/19/2020

## 2020-06-19 NOTE — Progress Notes (Signed)
  Echocardiogram 2D Echocardiogram has been performed.  Erin Dean 06/19/2020, 3:30 PM

## 2020-06-19 NOTE — Progress Notes (Signed)
Progress Note  Patient Name: Erin Dean Date of Encounter: 06/19/2020  [facility] Cardiologist: Skeet Latch, MD   Subjective   Patient feels better this morning. No chest pain or palpitations. Remains in NSR. Planned for myoview today.  Notably, patient refusing general anesthesia for TEE but willing to proceed with moderate sedation. She reports having very poor response to general anesthesia in the past where she would not wake-up.  Inpatient Medications    Scheduled Meds: . amLODipine  10 mg Oral Daily  . atorvastatin  80 mg Oral q1800  . calcitRIOL  0.25 mcg Oral Daily  . carvedilol  25 mg Oral BID WC  . cinacalcet  30 mg Oral Q breakfast  . cloNIDine  0.1 mg Oral TID  . escitalopram  10 mg Oral Daily  . furosemide  80 mg Oral BID  . hydrALAZINE  100 mg Oral TID  . pantoprazole  40 mg Oral Daily  . sevelamer carbonate  3,200 mg Oral TID with meals  . ticagrelor  90 mg Oral BID   Continuous Infusions: . heparin 1,250 Units/hr (06/19/20 0400)   PRN Meds: acetaminophen **OR** acetaminophen, ALPRAZolam, ondansetron **OR** ondansetron (ZOFRAN) IV   Vital Signs    Vitals:   06/19/20 0313 06/19/20 0328 06/19/20 0343 06/19/20 0700  BP: (!) 90/59 (!) 100/58 (!) 109/57 115/73  Pulse: (!) 55 (!) 52 (!) 52 (!) 59  Resp: (!) 25 (!) '21 16 16  '$ Temp:  98 F (36.7 C)  98.2 F (36.8 C)  TempSrc:  Oral  Oral  SpO2: 98% 98% 95% 95%  Weight:      Height:        Intake/Output Summary (Last 24 hours) at 06/19/2020 0923 Last data filed at 06/19/2020 0400 Gross per 24 hour  Intake 383.21 ml  Output 2021 ml  Net -1637.79 ml   Last 3 Weights 06/17/2020 04/02/2020 04/01/2020  Weight (lbs) 218 lb 251 lb 1.7 oz 248 lb 4.9 oz  Weight (kg) 98.884 kg 113.9 kg 112.63 kg      Telemetry    Sinus rhythm with PVCs - Personally Reviewed  ECG    No new tracing - Personally Reviewed  Physical Exam   GEN: No acute distress. Comfortable.   Neck: No JVD Cardiac: RRR,  no murmurs, rubs, or gallops.  Respiratory: Clear to auscultation bilaterally. GI: Soft, nontender, non-distended  MS: No edema; No deformity. Neuro:  Nonfocal  Psych: Normal affect   Labs    High Sensitivity Troponin:   Recent Labs  Lab 06/17/20 2020 06/17/20 2225 06/18/20 0338  TROPONINIHS 174* 233* 367*      Chemistry Recent Labs  Lab 06/17/20 2020 06/18/20 0338  NA 140 139  K 4.4 4.9  CL 97* 96*  CO2 27 29  GLUCOSE 111* 101*  BUN 33* 41*  CREATININE 8.61* 9.69*  CALCIUM 8.8* 8.7*  PROT 8.1  --   ALBUMIN 3.5  --   AST 17  --   ALT 11  --   ALKPHOS 66  --   BILITOT 0.2*  --   GFRNONAA 5* 4*  ANIONGAP 16* 14     Hematology Recent Labs  Lab 06/17/20 2020 06/18/20 0338 06/19/20 0741  WBC 10.1 11.0* 13.1*  RBC 3.03* 2.79* 3.04*  HGB 9.5* 8.6* 9.2*  HCT 29.5* 27.6* 29.7*  MCV 97.4 98.9 97.7  MCH 31.4 30.8 30.3  MCHC 32.2 31.2 31.0  RDW 14.6 14.6 15.1  PLT 199 186 228  BNPNo results for input(s): BNP, PROBNP in the last 168 hours.   DDimer No results for input(s): DDIMER in the last 168 hours.   Radiology    DG Chest Port 1 View  Result Date: 06/17/2020 CLINICAL DATA:  Chest pain EXAM: PORTABLE CHEST 1 VIEW COMPARISON:  March 31, 2020 FINDINGS: The cardiac silhouette is enlarged but stable from prior study. There is stable shift of the trachea to the right. There is no pneumothorax. No large pleural effusion or focal infiltrate. The previously demonstrated loop recorder is again noted. There is no acute osseous abnormality. IMPRESSION: Stable cardiomegaly. No acute cardiopulmonary process. Electronically Signed   By: Constance Holster M.D.   On: 06/17/2020 20:23    Cardiac Studies   Echo 04/02/20: 1. When compared to the prior study from 03/10/2018 aortc valve is now  thicker with a possible mobile echodensity attached to the left coronary  cusp. A TEE is recommended for further evaluation.  2. Left ventricular ejection fraction, by  estimation, is 65 to 70%. The  left ventricle has normal function. The left ventricle has no regional  wall motion abnormalities. Left ventricular diastolic parameters are  consistent with Grade II diastolic  dysfunction (pseudonormalization). Elevated left atrial pressure.  3. Right ventricular systolic function is normal. The right ventricular  size is normal. There is mildly elevated pulmonary artery systolic  pressure.  4. Left atrial size was mildly dilated.  5. The mitral valve is normal in structure. No evidence of mitral valve  regurgitation. No evidence of mitral stenosis.  6. The aortic valve is normal in structure. There is mild calcification  of the aortic valve. There is severe thickening of the aortic valve.  Aortic valve regurgitation is mild. No aortic stenosis is present.  7. The inferior vena cava is normal in size with greater than 50%  respiratory variability, suggesting right atrial pressure of 3 mmHg.   Patient Profile     55 y.o. female with a history of ESRD on HD, poorly controlled HTN, noncompliance, morbid obesity, chronic diastolic heart failure, and CVA (2019, 2021) who presented with chest pain found to be in SVT with elevated troponin for which cardiology has been consulted.  Assessment & Plan    #Paroxysmal Atrial fibrillation with RVR Patient presented with chest pain found to have new onset atrial fibrillation after review of telemetry strips and ILR. Converted back to NSR spontaneously. Remains in NSR today.  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 TEE deferred at that time due to [place] and patient was recommended to follow-up, but she has not. Need further characterization of aortic valve mass  prior to possible catheterization. Ideally, would obtain TEE, however, patient adamently refusing general sedation at this time as had bad experience with it in the past where she "would not wake up" and she "almost died." She is amenable to proceed with moderate sedation, however. Limited TTE pending. No current infectious symptoms, however, patient is HD patient which puts her at higher risk. -Follow-up limited TTE -Plan for TEE with MODERATE SEDATION (patient refusing general sedation)  #Chest pain: Developed in the setting of Afib with RVR. Resolved with conversion to NSR. HS troponin 174 --> 233 --> 367. ECG with TWI that were not present on priors. Suspect demand,  however, patient has multiple risk factors for CAD. Myoview pending. 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 -Continue  Amlodipine '10mg'$  daily -Continue coreg '25mg'$  BID -Continue clonidine 0.'1mg'$  TID -Continue lasix '80mg'$  BID -Continue hydralazine '100mg'$  TID  #Chronic diastolic heart failure -Fluid management with HD -Continue antihypertensive agents as detailed above   #Hx of CVA (2019, 2021) Patient with history of recurrent strokes. 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 Currently on ASA and ticagrelor. -Continue DAPT as above -TEE as above  #Anemia of chronic disease #ESRD on HD - TTS - per primary      For questions or updates, please contact [place] Please consult [email or web address] for contact info under        Signed, Freada Bergeron, MD  06/19/2020, 9:23 AM

## 2020-06-19 NOTE — Progress Notes (Signed)
[place] for heparin Indication: atrial fibrillation  Allergies  Allergen Reactions  . Chlorhexidine Gluconate Rash    Patient Measurements: Height: '5\' 8"'$  (172.7 cm) Weight: 98.9 kg (218 lb) IBW/kg (Calculated) : 63.9 Heparin Dosing Weight: 85kg  Vital Signs: Temp: 98.2 F (36.8 C) (03/13 0700) Temp Source: Oral (03/13 0700) BP: 124/78 (03/13 1000) Pulse Rate: 57 (03/13 1000)  Labs: Recent Labs    06/17/20 2020 06/17/20 2225 06/18/20 0338 06/19/20 0741  HGB 9.5*  --  8.6* 9.2*  HCT 29.5*  --  27.6* 29.7*  PLT 199  --  186 228  HEPARINUNFRC  --   --   --  0.85*  CREATININE 8.61*  --  9.69*  --   TROPONINIHS 174* 233* 367*  --     Estimated Creatinine Clearance: 8.1 mL/min (A) (by C-G formula based on SCr of 9.69 mg/dL (H)).   Medical History: Past Medical History:  Diagnosis Date  . ESRD (end stage renal disease) ([facility])    [facility]   . Hypertension   . Stroke ([facility]) 04/2017   no residual . Limp left side  . SVT (supraventricular tachycardia) (H[facility])     Assessment: 75 yoF admitted with AFib RVR. No AC PTA. Pharmacy to begin heparin infusion. Initial heparin level supratherapeutic at 0.81 after starting drip rate 1250 units/hr. No overt bleeding or infusion issues per discussion with nursing and patient. Hgb low but stable 9.2, pltc wnl.   Goal of Therapy:  Heparin level 0.3-0.7 units/ml Monitor platelets by anticoagulation protocol: Yes   Plan:  Decrease IV heparin to 1050 units/hr Check 8-hr HL Daily HL, CBC, s/sx bleeding  Richardine Service, PharmD, BCPS PGY2 Cardiology Pharmacy Resident Phone: [phone] 06/19/2020  11:13 AM  Please check [email or web address] for unit-specific pharmacy phone numbers.

## 2020-06-19 NOTE — Progress Notes (Signed)
Pt is concerned about sedation during TEE. She would like to speak with cardiologist/anesthesia prior to signing consent.   She also has a high risk myoview. Given suspected aortic valve vegetation, would prefer to complete TEE prior to consideration of heart cath.

## 2020-06-19 NOTE — Progress Notes (Signed)
    [facility] has been requested to perform a transesophageal echocardiogram on Erin Dean for aortic valve vegetation.  After careful review of history and examination, the risks and benefits of transesophageal echocardiogram have been explained including risks of esophageal damage, perforation (1:10,000 risk), bleeding, pharyngeal hematoma as well as other potential complications associated with conscious sedation including aspiration, arrhythmia, respiratory failure and death. Alternatives to treatment were discussed, questions were answered. Patient is willing to proceed.   Tami Lin Yazen Rosko, [place]  06/19/2020 11:06 AM

## 2020-06-19 NOTE — Progress Notes (Signed)
Patient is unsure about mild sedation TEE scheduled for 3/14---I have talked with Fabian Sharp and she will let consent be signed with anesthesiololgy in AM on 3/14 so that all patient's questions can be answered

## 2020-06-19 NOTE — Progress Notes (Addendum)
PROGRESS NOTE    Erin Dean  [id number] DOB: 06-18-1965 DOA: 06/17/2020 PCP: Lois Huxley, PA   Brief Narrative:  This 55 years old female with PMH significant for ESRD on hemodialysis, HTN, history of multiple CVAs, depression and anxiety presenting to the ED for the evaluation of chest pain.  Patient reports intermittent episodes of chest pain associated with palpitations over  last month , she had similar episode yesterday but was more intense than the previous one.  She took 2 nitro without any relief and called EMS.  She was found to be tachycardic with heart rate in 180s with rhythm strip indicating atrial fibrillation.  She was given aspirin and third nitro and was brought in the ED.  EKG shows normal sinus rhythm in the ED.  She reports being scheduled to have outpatient TEE because on recent TTE,  she was found to have a mobile echodensity on aortic valve. Troponins trending up.  Cardiology was consulted,  awaiting recommendation. Patient is scheduled to have [place] today and needs TEE to rule out aortic valve vegetation.  Assessment & Plan:   Principal Problem:   Atypical chest pain Active Problems:   Anxiety   Essential hypertension   Cryptogenic stroke (HCC)   ESRD (end stage renal disease) (HCC)   Arrhythmia, atrial   Elevated troponin   PAF (paroxysmal atrial fibrillation) (HCC)   Chest pain    Chest pain with elevated troponins; transient arrhythmia  - Patient presents after an episode of chest pain associated with transient tachyarrhythmia and is found to have elevated troponin  - Narrow complex irregular tachyarrhythmia with rate 186 captured by EMS and returned to [place] spontaneously prior to arrival in ED  - She reports similar episodes of rapid palpitations,  multiple times over the past month but this was more intense  - Continue cardiac monitoring, TSH normal,  troponin trending up. - Cardiology consulted, recommended Lexiscan Myoview.  She is  going to have [place] today. - She will need TEE to rule out aortic valve vegetation.  Paroxysmal atrial fibrillation: -EKG showed paroxysmal atrial fibrillation with rapid ventricular rate which subsequently converted to sinus rhythm. -Heart rate is controlled now , start heparin IV.  Plan is to start her on Eliquis if she does not require cardiac cath.   ESRD  - Completed HD on 06/16/20. - Continue fluid-restriction, Lasix, phosphate binder, renal-dosing of medications  - Nephro consulted for continuation of hemodialysis.  She had Dialysis early morning today.   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  - Needs to reschedule since she missed the appointment. - Continue aspirin, statin and Brilinta, reschedule outpatient TEE    Hypertension  - Continue Norvasc, hydralazine, clonidine, Coreg, Lasix    Depression; anxiety  - Continue Lexapro and as-needed Xanax.   DVT prophylaxis: Heparin subcu Code Status: Full code Family Communication: Family at bedside  Disposition Plan:  Status is: Observation  The patient remains OBS appropriate and will d/c before 2 midnights.  Dispo: The patient is from: Home              Anticipated d/c is to: Home              Patient currently is not medically stable to d/c.   Difficult to place patient No  Consultants:   Cardiology  Procedures: Echocardiogram Antimicrobials:   Anti-infectives (From admission, onward)   None      Subjective: Patient is seen and examined at bedside.  Overnight events noted.  Patient reports feeling better, reports chest pain has improved. Patient is concerned about not having any procedure which needs general anesthesia. She states she is willing to have procedure under light sedation but refuses general anesthesia. Explained in detail that she is  going to have [place] which does not require any sedation either.  Objective: Vitals:   06/19/20 0328 06/19/20 0343 06/19/20 0700 06/19/20 1000  BP: (!) 100/58 (!) 109/57 115/73 124/78  Pulse: (!) 52 (!) 52 (!) 59 (!) 57  Resp: (!) '21 16 16 14  '$ Temp: 98 F (36.7 C)  98.2 F (36.8 C)   TempSrc: Oral  Oral   SpO2: 98% 95% 95% 96%  Weight:      Height:        Intake/Output Summary (Last 24 hours) at 06/19/2020 1222 Last data filed at 06/19/2020 1000 Gross per 24 hour  Intake 383.21 ml  Output 2021 ml  Net -1637.79 ml   Filed Weights   06/17/20 1937  Weight: 98.9 kg    Examination:  General exam: Appears calm and comfortable, not in any acute distress. Respiratory system: Clear to auscultation. Respiratory effort normal. Cardiovascular system: S1 & S2 heard, RRR. No JVD, murmurs, rubs, gallops or clicks. No pedal edema. Gastrointestinal system: Abdomen is nondistended, soft and nontender. No organomegaly or masses felt.  Normal bowel sounds heard. Central nervous system: Alert and oriented. No focal neurological deficits. Extremities: Symmetric 5 x 5 power. Skin: No rashes, lesions or ulcers Psychiatry: Judgement and insight appear normal. Mood & affect appropriate.     Data Reviewed: I have personally reviewed following labs and imaging studies  CBC: Recent Labs  Lab 06/17/20 2020 06/18/20 0338 06/19/20 0741  WBC 10.1 11.0* 13.1*  NEUTROABS 7.1  --   --   HGB 9.5* 8.6* 9.2*  HCT 29.5* 27.6* 29.7*  MCV 97.4 98.9 97.7  PLT 199 186 [id number]   Basic Metabolic Panel: Recent Labs  Lab 06/17/20 2020 06/18/20 0338  NA 140 139  K 4.4 4.9  CL 97* 96*  CO2 27 29  GLUCOSE 111* 101*  BUN 33* 41*  CREATININE 8.61* 9.69*  CALCIUM 8.8* 8.7*   GFR: Estimated Creatinine Clearance: 8.1 mL/min (A) (by C-G formula based on SCr of 9.69 mg/dL (H)). Liver Function Tests: Recent Labs  Lab 06/17/20 2020  AST 17  ALT 11  ALKPHOS 66  BILITOT 0.2*  PROT 8.1  ALBUMIN 3.5    Recent Labs  Lab 06/17/20 2020  LIPASE 44   No results for input(s): AMMONIA in the last 168 hours. Coagulation Profile: No results for input(s): INR, PROTIME in the last 168 hours. Cardiac Enzymes: No results for input(s): CKTOTAL, CKMB, CKMBINDEX, TROPONINI in the last 168 hours. BNP (last 3 results) No results for input(s): PROBNP in the last 8760 hours. HbA1C: No results for input(s): HGBA1C in the last 72 hours. CBG: No results for input(s): GLUCAP in the last 168 hours. Lipid Profile: No results for input(s): CHOL, HDL, LDLCALC, TRIG, CHOLHDL, LDLDIRECT in the last 72 hours. Thyroid Function Tests: Recent Labs    06/18/20 0338  TSH 1.148   Anemia Panel: No results for input(s): VITAMINB12, FOLATE, FERRITIN, TIBC, IRON, RETICCTPCT in the last 72 hours. Sepsis Labs: No results for input(s): PROCALCITON, LATICACIDVEN in the last 168 hours.  Recent Results (from  the past 240 hour(s))  MRSA PCR Screening     Status: None   Collection Time: 06/18/20  2:20 PM   Specimen: Nasopharyngeal  Result Value Ref Range Status   MRSA by PCR NEGATIVE NEGATIVE Final    Comment:        The GeneXpert MRSA Assay (FDA approved for NASAL specimens only), is one component of a comprehensive MRSA colonization surveillance program. It is not intended to diagnose MRSA infection nor to guide or monitor treatment for MRSA infections. Performed at [facility], [place] [address] [place],  [address]      Radiology Studies: DG Chest Port 1 View  Result Date: 06/17/2020 CLINICAL DATA:  Chest pain EXAM: PORTABLE CHEST 1 VIEW COMPARISON:  March 31, 2020 FINDINGS: The cardiac silhouette is enlarged but stable from prior study. There is stable shift of the trachea to the right. There is no pneumothorax. No large pleural effusion or focal infiltrate. The previously demonstrated loop recorder is again noted. There is no acute osseous abnormality. IMPRESSION: Stable cardiomegaly. No  acute cardiopulmonary process. Electronically Signed   By: Constance Holster M.D.   On: 06/17/2020 20:23   Scheduled Meds: . amLODipine  10 mg Oral Daily  . atorvastatin  80 mg Oral q1800  . calcitRIOL  0.25 mcg Oral Daily  . carvedilol  25 mg Oral BID WC  . cinacalcet  30 mg Oral Q breakfast  . cloNIDine  0.1 mg Oral TID  . escitalopram  10 mg Oral Daily  . furosemide  80 mg Oral BID  . hydrALAZINE  100 mg Oral TID  . pantoprazole  40 mg Oral Daily  . regadenoson  0.4 mg Intravenous Once  . sevelamer carbonate  3,200 mg Oral TID with meals  . ticagrelor  90 mg Oral BID   Continuous Infusions: . heparin 1,050 Units/hr (06/19/20 1132)     LOS: 1 day    Time spent: 25 mins    Crestina Strike, MD [facility]   If 7PM-7AM, please contact night-coverage

## 2020-06-20 ENCOUNTER — Encounter (HOSPITAL_COMMUNITY)
Admission: EM | Disposition: A | Discharge status: Home / Self Care | Payer: Self-pay | Source: Home / Self Care | Attending: Family Medicine

## 2020-06-20 ENCOUNTER — Encounter (HOSPITAL_COMMUNITY): Payer: Self-pay | Admitting: Internal Medicine

## 2020-06-20 DIAGNOSIS — I2510 Atherosclerotic heart disease of native coronary artery without angina pectoris: Secondary | ICD-10-CM

## 2020-06-20 DIAGNOSIS — I5032 Chronic diastolic (congestive) heart failure: Secondary | ICD-10-CM

## 2020-06-20 DIAGNOSIS — N186 End stage renal disease: Secondary | ICD-10-CM | POA: Diagnosis not present

## 2020-06-20 DIAGNOSIS — R9439 Abnormal result of other cardiovascular function study: Secondary | ICD-10-CM

## 2020-06-20 DIAGNOSIS — R778 Other specified abnormalities of plasma proteins: Secondary | ICD-10-CM | POA: Diagnosis not present

## 2020-06-20 DIAGNOSIS — R0789 Other chest pain: Secondary | ICD-10-CM | POA: Diagnosis not present

## 2020-06-20 DIAGNOSIS — I214 Non-ST elevation (NSTEMI) myocardial infarction: Secondary | ICD-10-CM | POA: Diagnosis not present

## 2020-06-20 DIAGNOSIS — I200 Unstable angina: Secondary | ICD-10-CM | POA: Diagnosis not present

## 2020-06-20 HISTORY — PX: LEFT HEART CATH AND CORONARY ANGIOGRAPHY: CATH118249

## 2020-06-20 LAB — CBC
HCT: 28 % — ABNORMAL LOW (ref 36.0–46.0)
HCT: 27.5 % — ABNORMAL LOW (ref 36.0–46.0)
Hemoglobin: 8.7 g/dL — ABNORMAL LOW (ref 12.0–15.0)
Hemoglobin: 8.4 g/dL — ABNORMAL LOW (ref 12.0–15.0)
MCH: 30.5 pg (ref 26.0–34.0)
MCH: 30.3 pg (ref 26.0–34.0)
MCHC: 31.1 g/dL (ref 30.0–36.0)
MCHC: 30.5 g/dL (ref 30.0–36.0)
MCV: 98.2 fL (ref 80.0–100.0)
MCV: 99.3 fL (ref 80.0–100.0)
Platelets: 265 10*3/uL (ref 150–400)
Platelets: 250 10*3/uL (ref 150–400)
RBC: 2.85 MIL/uL — ABNORMAL LOW (ref 3.87–5.11)
RBC: 2.77 MIL/uL — ABNORMAL LOW (ref 3.87–5.11)
RDW: 15.3 % (ref 11.5–15.5)
RDW: 15.4 % (ref 11.5–15.5)
WBC: 12.2 10*3/uL — ABNORMAL HIGH (ref 4.0–10.5)
WBC: 9.5 10*3/uL (ref 4.0–10.5)
nRBC: 0 % (ref 0.0–0.2)
nRBC: 0 % (ref 0.0–0.2)

## 2020-06-20 LAB — POCT ACTIVATED CLOTTING TIME: Activated Clotting Time: 148 seconds

## 2020-06-20 LAB — BASIC METABOLIC PANEL
Anion gap: 14 (ref 5–15)
BUN: 24 mg/dL — ABNORMAL HIGH (ref 6–20)
CO2: 27 mmol/L (ref 22–32)
Calcium: 8.7 mg/dL — ABNORMAL LOW (ref 8.9–10.3)
Chloride: 97 mmol/L — ABNORMAL LOW (ref 98–111)
Creatinine, Ser: 7.47 mg/dL — ABNORMAL HIGH (ref 0.44–1.00)
GFR, Estimated: 6 mL/min — ABNORMAL LOW (ref >60)
Glucose, Bld: 94 mg/dL (ref 70–99)
Potassium: 4.4 mmol/L (ref 3.5–5.1)
Sodium: 138 mmol/L (ref 135–145)

## 2020-06-20 LAB — RENAL FUNCTION PANEL
Albumin: 3.1 g/dL — ABNORMAL LOW (ref 3.5–5.0)
Anion gap: 14 (ref 5–15)
BUN: 26 mg/dL — ABNORMAL HIGH (ref 6–20)
CO2: 26 mmol/L (ref 22–32)
Calcium: 8.4 mg/dL — ABNORMAL LOW (ref 8.9–10.3)
Chloride: 100 mmol/L (ref 98–111)
Creatinine, Ser: 8.5 mg/dL — ABNORMAL HIGH (ref 0.44–1.00)
GFR, Estimated: 5 mL/min — ABNORMAL LOW (ref >60)
Glucose, Bld: 98 mg/dL (ref 70–99)
Phosphorus: 6.2 mg/dL — ABNORMAL HIGH (ref 2.5–4.6)
Potassium: 4.4 mmol/L (ref 3.5–5.1)
Sodium: 140 mmol/L (ref 135–145)

## 2020-06-20 LAB — HEPARIN LEVEL (UNFRACTIONATED): Heparin Unfractionated: 0.54 IU/mL (ref 0.30–0.70)

## 2020-06-20 SURGERY — LEFT HEART CATH AND CORONARY ANGIOGRAPHY
Anesthesia: LOCAL

## 2020-06-20 MED ORDER — FENTANYL CITRATE (PF) 100 MCG/2ML IJ SOLN
INTRAMUSCULAR | Status: DC | PRN
  Administered 2020-06-20: 25 ug via INTRAVENOUS

## 2020-06-20 MED ORDER — SODIUM CHLORIDE 0.9 % IV SOLN: 250.0000 mL | INTRAVENOUS | Status: DC | PRN

## 2020-06-20 MED ORDER — CLONIDINE HCL 0.1 MG PO TABS
0.1000 mg | ORAL_TABLET | Freq: Every day | ORAL | Status: DC
  Administered 2020-06-20: 0.1 mg via ORAL
  Filled 2020-06-20 (×2): qty 1

## 2020-06-20 MED ORDER — HEPARIN (PORCINE) IN NACL 1000-0.9 UT/500ML-% IV SOLN
INTRAVENOUS | Status: AC
  Filled 2020-06-20: qty 1000

## 2020-06-20 MED ORDER — MIDAZOLAM HCL 2 MG/2ML IJ SOLN
INTRAMUSCULAR | Status: AC
  Filled 2020-06-20: qty 2

## 2020-06-20 MED ORDER — CLOPIDOGREL BISULFATE 300 MG PO TABS: 600.0000 mg | ORAL_TABLET | Freq: Once | ORAL | Status: DC

## 2020-06-20 MED ORDER — SODIUM CHLORIDE 0.9% FLUSH: 3.0000 mL | INTRAVENOUS | Status: DC | PRN

## 2020-06-20 MED ORDER — IOHEXOL 350 MG/ML SOLN
INTRAVENOUS | Status: DC | PRN
  Administered 2020-06-20: 60 mL

## 2020-06-20 MED ORDER — MIDAZOLAM HCL 2 MG/2ML IJ SOLN
INTRAMUSCULAR | Status: DC | PRN
  Administered 2020-06-20: 1 mg via INTRAVENOUS

## 2020-06-20 MED ORDER — SODIUM CHLORIDE 0.9 % IV SOLN: INTRAVENOUS | Status: DC

## 2020-06-20 MED ORDER — LIDOCAINE HCL (PF) 1 % IJ SOLN
INTRAMUSCULAR | Status: DC | PRN
  Administered 2020-06-20: 15 mL

## 2020-06-20 MED ORDER — SODIUM CHLORIDE 0.9% FLUSH
3.0000 mL | Freq: Two times a day (BID) | INTRAVENOUS | Status: DC
  Administered 2020-06-20 – 2020-06-22 (×3): 3 mL via INTRAVENOUS

## 2020-06-20 MED ORDER — HEPARIN (PORCINE) 25000 UT/250ML-% IV SOLN
950.0000 [IU]/h | INTRAVENOUS | Status: DC
  Administered 2020-06-20: 1050 [IU]/h via INTRAVENOUS
  Administered 2020-06-21: 950 [IU]/h via INTRAVENOUS
  Filled 2020-06-20 (×2): qty 250

## 2020-06-20 MED ORDER — SODIUM CHLORIDE 0.9 % IV SOLN
250.0000 mL | INTRAVENOUS | Status: DC | PRN
Start: 2020-06-20 — End: 2020-06-23

## 2020-06-20 MED ORDER — SODIUM CHLORIDE 0.9% FLUSH
3.0000 mL | INTRAVENOUS | Status: DC | PRN
Start: 2020-06-20 — End: 2020-06-23

## 2020-06-20 MED ORDER — LIDOCAINE-PRILOCAINE 2.5-2.5 % EX CREA: 1.0000 "application " | TOPICAL_CREAM | CUTANEOUS | Status: DC | PRN

## 2020-06-20 MED ORDER — HYDRALAZINE HCL 20 MG/ML IJ SOLN: 10.0000 mg | INTRAMUSCULAR | Status: AC | PRN

## 2020-06-20 MED ORDER — SODIUM CHLORIDE 0.9 % IV SOLN: 100.0000 mL | INTRAVENOUS | Status: DC | PRN

## 2020-06-20 MED ORDER — SODIUM CHLORIDE 0.9% FLUSH: 3.0000 mL | Freq: Two times a day (BID) | INTRAVENOUS | Status: DC

## 2020-06-20 MED ORDER — ASPIRIN 81 MG PO CHEW
81.0000 mg | CHEWABLE_TABLET | ORAL | Status: AC
  Administered 2020-06-20: 81 mg via ORAL
  Filled 2020-06-20: qty 1

## 2020-06-20 MED ORDER — HEPARIN (PORCINE) IN NACL 1000-0.9 UT/500ML-% IV SOLN
INTRAVENOUS | Status: DC | PRN
  Administered 2020-06-20 (×2): 500 mL

## 2020-06-20 MED ORDER — LABETALOL HCL 5 MG/ML IV SOLN: 10.0000 mg | INTRAVENOUS | Status: AC | PRN

## 2020-06-20 MED ORDER — FENTANYL CITRATE (PF) 100 MCG/2ML IJ SOLN
INTRAMUSCULAR | Status: AC
  Filled 2020-06-20: qty 2

## 2020-06-20 MED ORDER — LIDOCAINE HCL (PF) 1 % IJ SOLN: 5.0000 mL | INTRAMUSCULAR | Status: DC | PRN

## 2020-06-20 MED ORDER — CLOPIDOGREL BISULFATE 300 MG PO TABS
300.0000 mg | ORAL_TABLET | Freq: Once | ORAL | Status: AC
  Administered 2020-06-21: 300 mg via ORAL
  Filled 2020-06-20: qty 1

## 2020-06-20 MED ORDER — HEPARIN SODIUM (PORCINE) 1000 UNIT/ML DIALYSIS: 1000.0000 [IU] | INTRAMUSCULAR | Status: DC | PRN

## 2020-06-20 MED ORDER — CLOPIDOGREL BISULFATE 75 MG PO TABS
75.0000 mg | ORAL_TABLET | Freq: Every day | ORAL | Status: DC
  Administered 2020-06-22 – 2020-06-23 (×2): 75 mg via ORAL
  Filled 2020-06-20 (×2): qty 1

## 2020-06-20 MED ORDER — CHLORHEXIDINE GLUCONATE CLOTH 2 % EX PADS: 6.0000 | MEDICATED_PAD | Freq: Every day | CUTANEOUS | Status: DC

## 2020-06-20 MED ORDER — PENTAFLUOROPROP-TETRAFLUOROETH EX AERO: 1.0000 "application " | INHALATION_SPRAY | CUTANEOUS | Status: DC | PRN

## 2020-06-20 MED ORDER — ALTEPLASE 2 MG IJ SOLR: 2.0000 mg | Freq: Once | INTRAMUSCULAR | Status: DC | PRN

## 2020-06-20 MED ORDER — LIDOCAINE HCL (PF) 1 % IJ SOLN
INTRAMUSCULAR | Status: AC
  Filled 2020-06-20: qty 30

## 2020-06-20 SURGICAL SUPPLY — 9 items
CATH INFINITI 5FR MULTPACK ANG (CATHETERS) ×2 IMPLANT
KIT HEART LEFT (KITS) ×2 IMPLANT
KIT MICROPUNCTURE NIT STIFF (SHEATH) ×2 IMPLANT
PACK CARDIAC CATHETERIZATION (CUSTOM PROCEDURE TRAY) ×2 IMPLANT
SHEATH PINNACLE 5F 10CM (SHEATH) ×2 IMPLANT
SHEATH PROBE COVER 6X72 (BAG) ×2 IMPLANT
TRANSDUCER W/STOPCOCK (MISCELLANEOUS) ×2 IMPLANT
TUBING CIL FLEX 10 FLL-RA (TUBING) ×2 IMPLANT
WIRE EMERALD 3MM-J .035X150CM (WIRE) ×2 IMPLANT

## 2020-06-20 NOTE — Progress Notes (Signed)
[facility] ROUNDING NOTE   Subjective:   Brief history: 55 year old lady with end-stage renal disease Tuesday Thursday Saturday dialysis, history of hypertension diastolic heart failure multiple CVAs.  She was admitted     06/17/2020 with chest pain.  She is scheduled to undergo TEE for suspected aortic valve vegetation and high risk Myoview.  She dialyzes at [facility] Tuesday Thursday Saturday schedule.  Her last dialysis 06/19/2020 with 2 L removed.  Blood pressure 100/56  pulse 51 temperature 97.9 O2 sats 93% room air  Sodium 138 potassium 4.4 chloride 97 CO2 27 BUN 24 creatinine 7.47 glucose 94 calcium 8.7 hemoglobin 8.7  Medications: Amlodipine 10 mg daily, atorvastatin 80 mg daily calcitriol 0.25 mcg daily Coreg 25 mg twice daily Sensipar 30 mg daily, clonidine 0.1 mg 3 times daily, Lexapro 10 mg daily, Lasix 80 mg twice daily, hydralazine 100 mg 3 times daily Protonix 40 mg daily Renvela 3.2 g with meals, Brilinta 90 mg twice daily                      Objective:  Vital signs in last 24 hours:  Temp:  [97.9 F (36.6 C)-98.4 F (36.9 C)] 97.9 F (36.6 C) (03/14 0300) Pulse Rate:  [52-66] 52 (03/14 0400) Resp:  [12-25] 18 (03/14 0400) BP: (92-185)/(46-105) 98/56 (03/14 0300) SpO2:  [93 %-96 %] 96 % (03/14 0400)  Weight change:  Filed Weights   06/17/20 1937  Weight: 98.9 kg    Intake/Output: I/O last 3 completed shifts: In: 863.2 [P.O.:720; I.V.:143.2] Out: 2021 [Other:2021]   Intake/Output this shift:  Total I/O In: 385.4 [P.O.:100; I.V.:285.4] Out: 0   General: WDWN NAD Head: NCAT sclera not icteric MMM Neck: Supple. No JVD No masses Lungs: CTA bilaterally without wheezes, rales, or rhonchi. Breathing is unlabored. Heart: RRR with S1 S2 Abdomen: soft NT + BS Lower extremities:without edema or ischemic changes, no open wounds  Neuro: A & O  X 3. Moves all extremities spontaneously. Psych:  Responds to questions appropriately with a  normal affect. Dialysis Access: LUE AVF +bruit    Basic Metabolic Panel: Recent Labs  Lab 06/17/20 2020 06/18/20 0338 06/20/20 0027  NA 140 139 138  K 4.4 4.9 4.4  CL 97* 96* 97*  CO2 '27 29 27  '$ GLUCOSE 111* 101* 94  BUN 33* 41* 24*  CREATININE 8.61* 9.69* 7.47*  CALCIUM 8.8* 8.7* 8.7*    Liver Function Tests: Recent Labs  Lab 06/17/20 2020  AST 17  ALT 11  ALKPHOS 66  BILITOT 0.2*  PROT 8.1  ALBUMIN 3.5   Recent Labs  Lab 06/17/20 2020  LIPASE 44   No results for input(s): AMMONIA in the last 168 hours.  CBC: Recent Labs  Lab 06/17/20 2020 06/18/20 0338 06/19/20 0741 06/20/20 0027  WBC 10.1 11.0* 13.1* 12.2*  NEUTROABS 7.1  --   --   --   HGB 9.5* 8.6* 9.2* 8.7*  HCT 29.5* 27.6* 29.7* 28.0*  MCV 97.4 98.9 97.7 98.2  PLT 199 186 228 265    Cardiac Enzymes: No results for input(s): CKTOTAL, CKMB, CKMBINDEX, TROPONINI in the last 168 hours.  BNP: Invalid input(s): POCBNP  CBG: No results for input(s): GLUCAP in the last 168 hours.  Microbiology: Results for orders placed or performed during the hospital encounter of 06/17/20  MRSA PCR Screening     Status: None   Collection Time: 06/18/20  2:20 PM   Specimen: Nasopharyngeal  Result Value Ref  Range Status   MRSA by PCR NEGATIVE NEGATIVE Final    Comment:        The GeneXpert MRSA Assay (FDA approved for NASAL specimens only), is one component of a comprehensive MRSA colonization surveillance program. It is not intended to diagnose MRSA infection nor to guide or monitor treatment for MRSA infections. Performed at [facility], [place] [address] [place], [place] [address]     Coagulation Studies: No results for input(s): LABPROT, INR in the last 72 hours.  Urinalysis: No results for input(s): COLORURINE, LABSPEC, PHURINE, GLUCOSEU, HGBUR, BILIRUBINUR, KETONESUR, PROTEINUR, UROBILINOGEN, NITRITE, LEUKOCYTESUR in the last 72 hours.  Invalid input(s): APPERANCEUR    Imaging: NM  Myocar Multi W/Spect W/Wall Motion / EF  Result Date: 06/19/2020 CLINICAL DATA:  Chest pain EXAM: MYOCARDIAL IMAGING WITH SPECT (REST AND PHARMACOLOGIC-STRESS) GATED LEFT VENTRICULAR WALL MOTION STUDY LEFT VENTRICULAR EJECTION FRACTION TECHNIQUE: Standard myocardial SPECT imaging was performed after resting intravenous injection of 11 mCi Tc-26mtetrofosmin. Subsequently, intravenous infusion of Lexiscan was performed under the supervision of the Cardiology staff. At peak effect of the drug, 33 mCi Tc-919metrofosmin was injected intravenously and standard myocardial SPECT imaging was performed. Quantitative gated imaging was also performed to evaluate left ventricular wall motion, and estimate left ventricular ejection fraction. COMPARISON:  None. FINDINGS: Perfusion: There is a fixed defect in the inferior wall extending from mid wall the apex. There is a mildly reversible defect accounting for approximately 10% of left ventricular wall affecting the anterior septal wall. No other perfusion abnormalities. Wall Motion: Global hypokinesis. No wall thickening in the region of the inferior wall fixed defect. Left Ventricular Ejection Fraction: 44 % End diastolic volume [id number] End systolic volume 91 ml IMPRESSION: 1. There is an infarct in the inferior wall extending from mid wall to apex. There is a moderate size reversible defect in the anterior septal wall towards the apex. 2. Global hypokinesis. No wall thickening in the region of infarct. Severely abnormal end-diastolic volume and end systolic volume. 3. Left ventricular ejection fraction 44% 4. Non invasive risk stratification*: High *2012 Appropriate Use Criteria for Coronary Revascularization Focused Update: J Am Coll Cardiol. [id number]://content.onairportbarriers.comspx?articleid=1201161 These results will be called to the ordering clinician or representative by the Radiologist Assistant, and communication documented in the PACS or [employer] CompanyElectronically Signed   By: DaDorise BullionII M.D   On: 06/19/2020 14:15   ECHOCARDIOGRAM LIMITED  Result Date: 06/19/2020    ECHOCARDIOGRAM LIMITED REPORT   Patient Name:   SHJALISHA STONEate of Exam: 06/19/2020 Medical Rec #:  [id number]      Height:       68.0 in Accession #:    [id number]     Weight:       218.0 lb Date of Birth:  1210/21/1967      BSA:          2.120 m Patient Age:    89ears         BP:           185/105 mmHg Patient Gender: F                HR:           64 bpm. Exam Location:  Inpatient Procedure: Limited Echo and Color Doppler Indications:    possible aortic valve vegetation  History:        Patient has prior history of Echocardiogram examinations,  most                 recent 04/02/2020. End stage renal disease, Signs/Symptoms:Chest                 Pain; Risk Factors:Hypertension.  Sonographer:    Johny Chess Referring Phys: [id number] [place] [facility]  Sonographer Comments: Patient is morbidly obese. Image acquisition challenging due to patient body habitus. IMPRESSIONS  1. The aortic valve is tricuspid and the leaflets are moderately calcified. There is trivial aortic regurgitation. There is no apparent vegetation on the current study. It appears to just be calcium on the valve. Additionally, there is no significant regurgitation, which goes against a destructive lesion such as endocarditis. If there are clinical concerns for endocarditis, would recommend a TEE. The aortic valve is tricuspid. There is moderate calcification of the aortic valve. There is moderate thickening of the aortic valve. Aortic valve regurgitation is trivial. Mild to moderate aortic valve sclerosis/calcification is present, without any evidence of aortic stenosis.  2. Left ventricular ejection fraction, by estimation, is 60 to 65%. The left ventricle has normal function. The left ventricle has no regional wall motion abnormalities.  3. A small pericardial effusion is present. The  pericardial effusion is circumferential.  4. The mitral valve is degenerative. Mild mitral valve regurgitation. No evidence of mitral stenosis.  5. There is mildly elevated pulmonary artery systolic pressure. Comparison(s): No prior Echocardiogram. FINDINGS  Left Ventricle: Left ventricular ejection fraction, by estimation, is 60 to 65%. The left ventricle has normal function. The left ventricle has no regional wall motion abnormalities. Right Ventricle: There is mildly elevated pulmonary artery systolic pressure. The tricuspid regurgitant velocity is 3.01 m/s, and with an assumed right atrial pressure of 0 mmHg, the estimated right ventricular systolic pressure is 0000000 mmHg. Left Atrium: Left atrial size was normal in size. Right Atrium: Right atrial size was normal in size. Pericardium: A small pericardial effusion is present. The pericardial effusion is circumferential. Mitral Valve: The mitral valve is degenerative in appearance. Mild to moderate mitral annular calcification. Mild mitral valve regurgitation. No evidence of mitral valve stenosis. Tricuspid Valve: The tricuspid valve is grossly normal. Tricuspid valve regurgitation is trivial. No evidence of tricuspid stenosis. Aortic Valve: The aortic valve is tricuspid and the leaflets are moderately calcified. There is trivial aortic regurgitation. There is no apparent vegetation on the current study. It appears to just be calcium on the valve. Additionally, there is no significant regurgitation, which goes against a destructive lesion such as endocarditis. If there are clinical concerns for endocarditis, would recommend a TEE. The aortic valve is tricuspid. There is moderate calcification of the aortic valve. There is moderate thickening of the aortic valve. Aortic valve regurgitation is trivial. Mild to moderate aortic valve sclerosis/calcification is present, without any evidence of aortic stenosis. Pulmonic Valve: The pulmonic valve was grossly normal.  Pulmonic valve regurgitation is trivial. No evidence of pulmonic stenosis. Aorta: The aortic root and ascending aorta are structurally normal, with no evidence of dilitation. LEFT VENTRICLE PLAX 2D LVIDd:         4.90 cm LVIDs:         3.20 cm LVOT diam:     2.20 cm LV SV:         69 LV SV Index:   33 LVOT Area:     3.80 cm  AORTIC VALVE LVOT Vmax:   88.80 cm/s LVOT Vmean:  55.100 cm/s LVOT VTI:    0.182 m  AORTA Ao Root diam: 2.80 cm Ao Asc diam:  3.20 cm MITRAL VALVE               TRICUSPID VALVE MV Area (PHT): 3.17 cm    TR Peak grad:   36.2 mmHg MV Decel Time: 239 msec    TR Vmax:        301.00 cm/s MV E velocity: 80.60 cm/s MV A velocity: 77.60 cm/s  SHUNTS MV E/A ratio:  1.04        Systemic VTI:  0.18 m                            Systemic Diam: 2.20 cm Eleonore Chiquito MD Electronically signed by Eleonore Chiquito MD Signature Date/Time: 06/19/2020/4:08:02 PM    Final      Medications:   . sodium chloride    . sodium chloride    . sodium chloride 20 mL/hr at 06/20/20 0532  . heparin 1,050 Units/hr (06/19/20 1345)   . amLODipine  10 mg Oral Daily  . atorvastatin  80 mg Oral q1800  . calcitRIOL  0.25 mcg Oral Daily  . carvedilol  25 mg Oral BID WC  . cinacalcet  30 mg Oral Q breakfast  . cloNIDine  0.1 mg Oral TID  . escitalopram  10 mg Oral Daily  . furosemide  80 mg Oral BID  . heparin  3,000 Units Dialysis Once in dialysis  . hydrALAZINE  100 mg Oral TID  . pantoprazole  40 mg Oral Daily  . sevelamer carbonate  3,200 mg Oral TID with meals  . ticagrelor  90 mg Oral BID   sodium chloride, sodium chloride, acetaminophen **OR** acetaminophen, ALPRAZolam, alteplase, heparin, lidocaine (PF), lidocaine-prilocaine, ondansetron **OR** ondansetron (ZOFRAN) IV, pentafluoroprop-tetrafluoroeth  Assessment/ Plan:  Dialysis Orders:  [facility] TTS 4h 400/500 EDW 108 kg 2K/2Ca UFP 4  AVF Heparin 3000  Sensipar 180 TIW, Calcitriol 3.75 TIW, Mircera 60 q 2 wks (last 3/10)   Assessment/Plan: 1. Chest  pain/elevated troponin --Cardiology following. Concern for aortic valve vegetation on outpatient Echo. Plan for [facility] and TEE   2. AFib - with RVR on admission. Controlled. On Coreg 25 bid. Heparin per pharmacy.  3. ESRD -  HD TTS. Continue on schedule. Next HD 3/15.  4. Hypertension/volume  - Variable BP.Continue home meds, Coreg, Norvasc, hydralazine. No gross volume excess on exam. Net UF 2L on 3/12. Below EDW by weights here.  It appears that blood pressures are fairly low.  Will decrease clonidine and stop hydralazine 5. Anemia  - Hgb 9.2 Recent ESA dose as outpatient. Follow trends  6. Metabolic bone disease -  Continue binders, Sensipar, Calcitriol. Follow trends here.  7. Nutrition - Renal diet with fluid restriction  8. Hx prior CVA     LOS: 2 Sherril Croon '@TODAY''@6'$ :02 AM

## 2020-06-20 NOTE — Plan of Care (Signed)
  Problem: Education: Goal: Knowledge of General Education information will improve Description: Including pain rating scale, medication(s)/side effects and non-pharmacologic comfort measures Outcome: Progressing   Problem: Clinical Measurements: Goal: Cardiovascular complication will be avoided Outcome: Progressing   Problem: Nutrition: Goal: Adequate nutrition will be maintained Outcome: Progressing   Problem: Pain Managment: Goal: General experience of comfort will improve Outcome: Progressing   Problem: Education: Goal: Understanding of CV disease, CV risk reduction, and recovery process will improve Outcome: Progressing   Problem: Cardiovascular: Goal: Vascular access site(s) Level 0-1 will be maintained Outcome: Progressing

## 2020-06-20 NOTE — Interval H&P Note (Signed)
History and Physical Interval Note:  06/20/2020 12:52 PM  Erin Dean  has presented today for surgery, with the diagnosis of abnormal stress - chest pain.  The various methods of treatment have been discussed with the patient and family. After consideration of risks, benefits and other options for treatment, the patient has consented to  Procedure(s): LEFT HEART CATH AND CORONARY ANGIOGRAPHY (N/A) as a surgical intervention.  The patient's history has been reviewed, patient examined, no change in status, stable for surgery.  I have reviewed the patient's chart and labs.  Questions were answered to the patient's satisfaction.    Cath Lab Visit (complete for each Cath Lab visit)  Clinical Evaluation Leading to the Procedure:   ACS: Yes.    Non-ACS:  N/A  Adilenne Ashworth

## 2020-06-20 NOTE — H&P (View-Only) (Signed)
Progress Note  Patient Name: Erin Dean Date of Encounter: 06/20/2020  [facility] Cardiologist: Skeet Latch, MD   Subjective   Denies any chest pain or SOB.  Myoview yesterday was high risk.    Notably, patient refusing general anesthesia for TEE but willing to proceed with moderate sedation. She reports having very poor response to general anesthesia in the past where she would not wake-up.  Inpatient Medications    Scheduled Meds: . amLODipine  10 mg Oral Daily  . atorvastatin  80 mg Oral q1800  . calcitRIOL  0.25 mcg Oral Daily  . carvedilol  25 mg Oral BID WC  . cinacalcet  30 mg Oral Q breakfast  . cloNIDine  0.1 mg Oral Daily  . escitalopram  10 mg Oral Daily  . furosemide  80 mg Oral BID  . heparin  3,000 Units Dialysis Once in dialysis  . pantoprazole  40 mg Oral Daily  . sevelamer carbonate  3,200 mg Oral TID with meals  . ticagrelor  90 mg Oral BID   Continuous Infusions: . sodium chloride    . sodium chloride    . sodium chloride 20 mL/hr at 06/20/20 0532  . heparin 1,050 Units/hr (06/19/20 1345)   PRN Meds: sodium chloride, sodium chloride, acetaminophen **OR** acetaminophen, ALPRAZolam, alteplase, heparin, lidocaine (PF), lidocaine-prilocaine, ondansetron **OR** ondansetron (ZOFRAN) IV, pentafluoroprop-tetrafluoroeth   Vital Signs    Vitals:   06/19/20 2300 06/20/20 0000 06/20/20 0300 06/20/20 0400  BP: (!) 92/46  (!) 98/56   Pulse: (!) 59 (!) 58 (!) 54 (!) 52  Resp: '18 19 18 18  '$ Temp: 98.1 F (36.7 C)  97.9 F (36.6 C)   TempSrc: Oral  Oral   SpO2: 94% 93% 96% 96%  Weight:      Height:        Intake/Output Summary (Last 24 hours) at 06/20/2020 0830 Last data filed at 06/20/2020 0600 Gross per 24 hour  Intake 865.4 ml  Output 0 ml  Net 865.4 ml   Last 3 Weights 06/17/2020 04/02/2020 04/01/2020  Weight (lbs) 218 lb 251 lb 1.7 oz 248 lb 4.9 oz  Weight (kg) 98.884 kg 113.9 kg 112.63 kg      Telemetry    NSR with PVCs-  Personally Reviewed  ECG    No new tracing - Personally Reviewed  Physical Exam   GEN: Well nourished, well developed in no acute distress HEENT: Normal NECK: No JVD; No carotid bruits LYMPHATICS: No lymphadenopathy CARDIAC:RRR, no murmurs, rubs, gallops RESPIRATORY:  Clear to auscultation without rales, wheezing or rhonchi  ABDOMEN: Soft, non-tender, non-distended MUSCULOSKELETAL:  No edema; No deformity  SKIN: Warm and dry NEUROLOGIC:  Alert and oriented x 3 PSYCHIATRIC:  Normal affect    Labs    High Sensitivity Troponin:   Recent Labs  Lab 06/17/20 2020 06/17/20 2225 06/18/20 0338  TROPONINIHS 174* 233* 367*      Chemistry Recent Labs  Lab 06/17/20 2020 06/18/20 0338 06/20/20 0027  NA 140 139 138  K 4.4 4.9 4.4  CL 97* 96* 97*  CO2 '27 29 27  '$ GLUCOSE 111* 101* 94  BUN 33* 41* 24*  CREATININE 8.61* 9.69* 7.47*  CALCIUM 8.8* 8.7* 8.7*  PROT 8.1  --   --   ALBUMIN 3.5  --   --   AST 17  --   --   ALT 11  --   --   ALKPHOS 66  --   --   BILITOT 0.2*  --   --  GFRNONAA 5* 4* 6*  ANIONGAP 16* 14 14     Hematology Recent Labs  Lab 06/18/20 0338 06/19/20 0741 06/20/20 0027  WBC 11.0* 13.1* 12.2*  RBC 2.79* 3.04* 2.85*  HGB 8.6* 9.2* 8.7*  HCT 27.6* 29.7* 28.0*  MCV 98.9 97.7 98.2  MCH 30.8 30.3 30.5  MCHC 31.2 31.0 31.1  RDW 14.6 15.1 15.3  PLT 186 228 265    BNPNo results for input(s): BNP, PROBNP in the last 168 hours.   DDimer No results for input(s): DDIMER in the last 168 hours.   Radiology    NM Myocar Multi W/Spect W/Wall Motion / EF  Result Date: 06/19/2020 CLINICAL DATA:  Chest pain EXAM: MYOCARDIAL IMAGING WITH SPECT (REST AND PHARMACOLOGIC-STRESS) GATED LEFT VENTRICULAR WALL MOTION STUDY LEFT VENTRICULAR EJECTION FRACTION TECHNIQUE: Standard myocardial SPECT imaging was performed after resting intravenous injection of 11 mCi Tc-6mtetrofosmin. Subsequently, intravenous infusion of Lexiscan was performed under the supervision of  the Cardiology staff. At peak effect of the drug, 33 mCi Tc-951metrofosmin was injected intravenously and standard myocardial SPECT imaging was performed. Quantitative gated imaging was also performed to evaluate left ventricular wall motion, and estimate left ventricular ejection fraction. COMPARISON:  None. FINDINGS: Perfusion: There is a fixed defect in the inferior wall extending from mid wall the apex. There is a mildly reversible defect accounting for approximately 10% of left ventricular wall affecting the anterior septal wall. No other perfusion abnormalities. Wall Motion: Global hypokinesis. No wall thickening in the region of the inferior wall fixed defect. Left Ventricular Ejection Fraction: 44 % End diastolic volume [id number] End systolic volume 91 ml IMPRESSION: 1. There is an infarct in the inferior wall extending from mid wall to apex. There is a moderate size reversible defect in the anterior septal wall towards the apex. 2. Global hypokinesis. No wall thickening in the region of infarct. Severely abnormal end-diastolic volume and end systolic volume. 3. Left ventricular ejection fraction 44% 4. Non invasive risk stratification*: High *2012 Appropriate Use Criteria for Coronary Revascularization Focused Update: J Am Coll Cardiol. [id number]://content.onairportbarriers.comspx?articleid=1201161 These results will be called to the ordering clinician or representative by the Radiologist Assistant, and communication documented in the PACS or ClFrontier Oil CorporationElectronically Signed   By: DaDorise BullionII M.D   On: 06/19/2020 14:15   ECHOCARDIOGRAM LIMITED  Result Date: 06/19/2020    ECHOCARDIOGRAM LIMITED REPORT   Patient Name:   Erin SOLBERGate of Exam: 06/19/2020 Medical Rec #:  [id number]      Height:       68.0 in Accession #:    [id number]     Weight:       218.0 lb Date of Birth:  [birth year]      BSA:          2.120 m Patient Age:    [age]         BP:            185/105 mmHg Patient Gender: F                HR:           64 bpm. Exam Location:  Inpatient Procedure: Limited Echo and Color Doppler Indications:    possible aortic valve vegetation  History:        Patient has prior history of Echocardiogram examinations, most  recent 04/02/2020. End stage renal disease, Signs/Symptoms:Chest                 Pain; Risk Factors:Hypertension.  Sonographer:    Johny Chess Referring Phys: [id number] [employer]  Sonographer Comments: Patient is morbidly obese. Image acquisition challenging due to patient body habitus. IMPRESSIONS  1. The aortic valve is tricuspid and the leaflets are moderately calcified. There is trivial aortic regurgitation. There is no apparent vegetation on the current study. It appears to just be calcium on the valve. Additionally, there is no significant regurgitation, which goes against a destructive lesion such as endocarditis. If there are clinical concerns for endocarditis, would recommend a TEE. The aortic valve is tricuspid. There is moderate calcification of the aortic valve. There is moderate thickening of the aortic valve. Aortic valve regurgitation is trivial. Mild to moderate aortic valve sclerosis/calcification is present, without any evidence of aortic stenosis.  2. Left ventricular ejection fraction, by estimation, is 60 to 65%. The left ventricle has normal function. The left ventricle has no regional wall motion abnormalities.  3. A small pericardial effusion is present. The pericardial effusion is circumferential.  4. The mitral valve is degenerative. Mild mitral valve regurgitation. No evidence of mitral stenosis.  5. There is mildly elevated pulmonary artery systolic pressure. Comparison(s): No prior Echocardiogram. FINDINGS  Left Ventricle: Left ventricular ejection fraction, by estimation, is 60 to 65%. The left ventricle has normal function. The left ventricle has no regional wall motion abnormalities. Right  Ventricle: There is mildly elevated pulmonary artery systolic pressure. The tricuspid regurgitant velocity is 3.01 m/s, and with an assumed right atrial pressure of 0 mmHg, the estimated right ventricular systolic pressure is 0000000 mmHg. Left Atrium: Left atrial size was normal in size. Right Atrium: Right atrial size was normal in size. Pericardium: A small pericardial effusion is present. The pericardial effusion is circumferential. Mitral Valve: The mitral valve is degenerative in appearance. Mild to moderate mitral annular calcification. Mild mitral valve regurgitation. No evidence of mitral valve stenosis. Tricuspid Valve: The tricuspid valve is grossly normal. Tricuspid valve regurgitation is trivial. No evidence of tricuspid stenosis. Aortic Valve: The aortic valve is tricuspid and the leaflets are moderately calcified. There is trivial aortic regurgitation. There is no apparent vegetation on the current study. It appears to just be calcium on the valve. Additionally, there is no significant regurgitation, which goes against a destructive lesion such as endocarditis. If there are clinical concerns for endocarditis, would recommend a TEE. The aortic valve is tricuspid. There is moderate calcification of the aortic valve. There is moderate thickening of the aortic valve. Aortic valve regurgitation is trivial. Mild to moderate aortic valve sclerosis/calcification is present, without any evidence of aortic stenosis. Pulmonic Valve: The pulmonic valve was grossly normal. Pulmonic valve regurgitation is trivial. No evidence of pulmonic stenosis. Aorta: The aortic root and ascending aorta are structurally normal, with no evidence of dilitation. LEFT VENTRICLE PLAX 2D LVIDd:         4.90 cm LVIDs:         3.20 cm LVOT diam:     2.20 cm LV SV:         69 LV SV Index:   33 LVOT Area:     3.80 cm  AORTIC VALVE LVOT Vmax:   88.80 cm/s LVOT Vmean:  55.100 cm/s LVOT VTI:    0.182 m  AORTA Ao Root diam: 2.80 cm Ao Asc diam:   3.20 cm MITRAL VALVE  TRICUSPID VALVE MV Area (PHT): 3.17 cm    TR Peak grad:   36.2 mmHg MV Decel Time: 239 msec    TR Vmax:        301.00 cm/s MV E velocity: 80.60 cm/s MV A velocity: 77.60 cm/s  SHUNTS MV E/A ratio:  1.04        Systemic VTI:  0.18 m                            Systemic Diam: 2.20 cm Eleonore Chiquito MD Electronically signed by Eleonore Chiquito MD Signature Date/Time: 06/19/2020/4:08:02 PM    Final     Cardiac Studies   06/19/2020 IMPRESSION: 1. There is an infarct in the inferior wall extending from mid wall to apex. There is a moderate size reversible defect in the anterior septal wall towards the apex.  2. Global hypokinesis. No wall thickening in the region of infarct. Severely abnormal end-diastolic volume and end systolic volume.  3. Left ventricular ejection fraction 44%  4. Non invasive risk stratification*: High  ECHO 06/19/2020 IMPRESSIONS   1. The aortic valve is tricuspid and the leaflets are moderately  calcified. There is trivial aortic regurgitation. There is no apparent  vegetation on the current study. It appears to just be calcium on the  valve. Additionally, there is no significant  regurgitation, which goes against a destructive lesion such as  endocarditis. If there are clinical concerns for endocarditis, would  recommend a TEE. The aortic valve is tricuspid. There is moderate  calcification of the aortic valve. There is moderate  thickening of the aortic valve. Aortic valve regurgitation is trivial.  Mild to moderate aortic valve sclerosis/calcification is present, without  any evidence of aortic stenosis.  2. Left ventricular ejection fraction, by estimation, is 60 to 65%. The  left ventricle has normal function. The left ventricle has no regional  wall motion abnormalities.  3. A small pericardial effusion is present. The pericardial effusion is  circumferential.  4. The mitral valve is degenerative. Mild mitral valve  regurgitation. No  evidence of mitral stenosis.  5. There is mildly elevated pulmonary artery systolic pressure.   Comparison(s): No prior Echocardiogram.  Echo 04/02/20: 1. When compared to the prior study from 03/10/2018 aortc valve is now  thicker with a possible mobile echodensity attached to the left coronary  cusp. A TEE is recommended for further evaluation.  2. Left ventricular ejection fraction, by estimation, is 65 to 70%. The  left ventricle has normal function. The left ventricle has no regional  wall motion abnormalities. Left ventricular diastolic parameters are  consistent with Grade II diastolic  dysfunction (pseudonormalization). Elevated left atrial pressure.  3. Right ventricular systolic function is normal. The right ventricular  size is normal. There is mildly elevated pulmonary artery systolic  pressure.  4. Left atrial size was mildly dilated.  5. The mitral valve is normal in structure. No evidence of mitral valve  regurgitation. No evidence of mitral stenosis.  6. The aortic valve is normal in structure. There is mild calcification  of the aortic valve. There is severe thickening of the aortic valve.  Aortic valve regurgitation is mild. No aortic stenosis is present.  7. The inferior vena cava is normal in size with greater than 50%  respiratory variability, suggesting right atrial pressure of 3 mmHg.   Patient Profile     55 y.o. female with a history of ESRD on HD, poorly controlled HTN,  noncompliance, morbid obesity, chronic diastolic heart failure, and CVA (2019, 2021) who presented with chest pain found to be in SVT with elevated troponin for which cardiology has been consulted.  Assessment & Plan    #Paroxysmal Atrial fibrillation with RVR -Patient presented with chest pain and rapid afib with RVR -she continues to maintain NSR on exam today -Continue coreg '25mg'$  BID -Continue heparin gtt for [facility] for now pending plans for cath -transition to  apixaban prior to discharge  #Concern for aortic valve vegetation: Patient noted to have thicker aortic valve with possible mobile echodensity on the [place] on surface TTE in 03/2020. TEE deferred at that time due to [place] and patient was recommended to follow-up, but she has not.  -patient adamently refused general sedation at this time as had bad experience with it in the past where she "would not wake up" and she "almost died." She is amenable to proceed with moderate sedation, however.  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 Resolved with conversion to NSR. HS troponin 174 --> 233 --> 367. ECG with TWI that were not present on priors. Suspect demand, however, patient has multiple risk factors for CAD.  Leane Call was high risk showing infarct in the inferior wall extending from mid wall to apex with moderate size reversible defect in the anterior septal wall towards the apex. -will plan for cath today as she was already NPO for TEE which has been cancelled -Continue heparin gtt for AC, coreg '25mg'$  BID, lipitor '80mg'$  daily -Continue ASA and ticagrelor which patient was taking for recurrent stroke -If needs triple therapy in future, likely transition to ASA, plavix, apixaban -Shared Decision Making/Informed Consent The risks [stroke (1 in 1000), death (1 in 1000), kidney failure [usually temporary] (1 in 500), bleeding (1 in 200), allergic reaction [possibly serious] (1 in 200)],  benefits (diagnostic support and management of coronary artery disease) and alternatives of a cardiac catheterization were discussed in detail with Ms. Crute and she is willing to proceed.  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   -TEE in 2019 for CVA showed no PFO by bubble study -Currently on ASA and ticagrelor. -Continue DAPT as above  #Anemia of chronic disease #ESRD on HD - TTS - per primary  I have spent a total of 35 minutes with patient reviewing 2D echo from 03/2020 and 06/2020 , telemetry, EKGs, labs and examining patient as well as establishing an assessment and plan that was discussed with the patient.  > 50% of time was spent in direct patient care including risks of cath.  For questions or updates, please contact [place] Please consult [email or web address] for  contact info under      Signed, Fransico Him, MD  06/20/2020, 8:30 AM

## 2020-06-20 NOTE — Progress Notes (Signed)
PROGRESS NOTE    Erin Dean  [id number] DOB: [birth year] DOA: 06/17/2020 PCP: Lois Huxley, PA   Brief Narrative:  This 55 years old female with PMH significant for ESRD on hemodialysis, HTN, history of multiple CVAs, depression and anxiety presenting to the ED for the evaluation of chest pain.  Patient reports intermittent episodes of chest pain associated with palpitations over  last month , she had similar episode yesterday but was more intense than the previous one.  She took 2 nitro without any relief and called EMS.  She was found to be tachycardic with heart rate in 180s with rhythm strip indicating atrial fibrillation.  She was given aspirin and third nitro and was brought in the ED.  EKG shows normal sinus rhythm in the ED.  She reports being scheduled to have outpatient TEE because on recent TTE,  she was found to have a mobile echodensity on aortic valve. Troponins trending up.  Cardiology was consulted, Patient underwent Lexiscan Myoview which was positive.  Initial plan was TEE to rule out aortic valve vegetation.  Patient has concerns about general anesthesia with TEE. She reports very poor response with general anesthesia in the past where she would not wake up.  Cardiology thinks there is no indication for TEE at this point.  Patient is scheduled to have left heart cath 3/14.   Assessment & Plan:   Principal Problem:   Atypical chest pain Active Problems:   Anxiety   Essential hypertension   Cryptogenic stroke (HCC)   ESRD (end stage renal disease) (HCC)   Arrhythmia, atrial   Elevated troponin   PAF (paroxysmal atrial fibrillation) (HCC)   Chest pain   Chronic diastolic CHF (congestive heart failure) (HCC)   Chest pain with elevated troponins; transient arrhythmia  - Patient presents after an episode of chest pain associated with transient tachyarrhythmia and is found to have elevated troponin  - Narrow complex irregular tachyarrhythmia with rate 186 captured by  EMS and returned to [place] spontaneously prior to arrival in ED  - She reports similar episodes of rapid palpitations,  multiple times over the past month but this was more intense  - Continue cardiac monitoring, TSH normal,  troponin trending up. - Cardiology consulted, patient underwent Lexiscan Myoview which was positive for ischemia. - Initial plan was TEE to rule out aortic valve vegetation. - Repeat TTE showed typical aortic valve sclerosis but no focal density that is worrisome for vegetation. - At this time cardiology think there is no indication for TEE. - Patient has refused general sedation at this time as she had bad experience in the past where she would not wake up.   - She is agreeable to moderate sedation. - Patient is scheduled to have left heart cath today.  Paroxysmal atrial fibrillation: -EKG showed paroxysmal atrial fibrillation with rapid ventricular rate which subsequently converted to sinus rhythm. -Heart rate is controlled now , Continue heparin IV.  Plan is to transition to Eliquis before discharge   ESRD: - Continue fluid-restriction, Lasix, phosphate binder, renal-dosing of medications  - Nephro consulted for continuation of hemodialysis.   - Hemodialysis as per schedule.   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  - Needs to reschedule since she missed the appointment. - Continue aspirin, statin and Brilinta, reschedule outpatient TEE. - Cardiology states there is  no indication for TEE at this time.   Hypertension  - Continue Norvasc, hydralazine, clonidine, Coreg, Lasix    Depression; anxiety  - Continue Lexapro and as-needed Xanax.   DVT prophylaxis: Heparin gtt Code Status: Full code Family Communication: Family at bedside  Disposition Plan:  Status is: Inpatient  Remains inpatient appropriate  because:Inpatient level of care appropriate due to severity of illness   Dispo: The patient is from: Home              Anticipated d/c is to: Home              Patient currently is not medically stable to d/c.   Difficult to place patient No   Consultants:   Cardiology  Procedures: Echocardiogram Antimicrobials:   Anti-infectives (From admission, onward)   None      Subjective: Patient was seen and examined at bedside.  Overnight events noted.  Patient reports feeling better, reports chest pain has resolved. Patient has refused about having any procedure which needs general anesthesia. She states she is willing to have procedure under light sedation but refuses general anesthesia. She is scheduled to have left heart cath later in the day today.  Objective: Vitals:   06/20/20 0300 06/20/20 0400 06/20/20 0916 06/20/20 1125  BP: (!) 98/56  (!) 109/47 115/70  Pulse: (!) 54 (!) 52  (!) 55  Resp: '18 18  18  '$ Temp: 97.9 F (36.6 C)   98 F (36.7 C)  TempSrc: Oral   Oral  SpO2: 96% 96%  96%  Weight:      Height:        Intake/Output Summary (Last 24 hours) at 06/20/2020 1141 Last data filed at 06/20/2020 1100 Gross per 24 hour  Intake 947.4 ml  Output 0 ml  Net 947.4 ml   Filed Weights   06/17/20 1937  Weight: 98.9 kg    Examination:  General exam: Appears calm and comfortable, not in any acute distress. Respiratory system: Clear to auscultation. Respiratory effort normal. Cardiovascular system: S1 & S2 heard, RRR. No JVD, murmurs, rubs, gallops or clicks. No pedal edema. Gastrointestinal system: Abdomen is nondistended, soft and nontender. No organomegaly or masses felt.  Normal bowel sounds heard. Central nervous system: Alert and oriented. No focal neurological deficits. Extremities: Symmetric 5 x 5 power.  No edema, no cyanosis, no clubbing. Skin: No rashes, lesions or ulcers Psychiatry: Judgement and insight appear normal. Mood & affect appropriate.      Data Reviewed: I have personally reviewed following labs and imaging studies  CBC: Recent Labs  Lab 06/17/20 2020 06/18/20 0338 06/19/20 0741 06/20/20 0027  WBC 10.1 11.0* 13.1* 12.2*  NEUTROABS 7.1  --   --   --   HGB 9.5* 8.6* 9.2* 8.7*  HCT 29.5* 27.6* 29.7* 28.0*  MCV 97.4 98.9 97.7 98.2  PLT 199 186 228 [id number]   Basic Metabolic Panel: Recent Labs  Lab 06/17/20 2020 06/18/20 0338 06/20/20 0027  NA 140 139 138  K 4.4 4.9 4.4  CL 97* 96* 97*  CO2 '27 29 27  '$ GLUCOSE 111* 101* 94  BUN 33* 41* 24*  CREATININE 8.61* 9.69* 7.47*  CALCIUM 8.8* 8.7* 8.7*   GFR: Estimated Creatinine Clearance: 10.5 mL/min (A) (by C-G formula based on SCr of 7.47 mg/dL (H)). Liver Function Tests: Recent Labs  Lab 06/17/20 2020  AST 17  ALT 11  ALKPHOS 66  BILITOT 0.2*  PROT 8.1  ALBUMIN 3.5   Recent Labs  Lab  06/17/20 2020  LIPASE 44   No results for input(s): AMMONIA in the last 168 hours. Coagulation Profile: No results for input(s): INR, PROTIME in the last 168 hours. Cardiac Enzymes: No results for input(s): CKTOTAL, CKMB, CKMBINDEX, TROPONINI in the last 168 hours. BNP (last 3 results) No results for input(s): PROBNP in the last 8760 hours. HbA1C: No results for input(s): HGBA1C in the last 72 hours. CBG: No results for input(s): GLUCAP in the last 168 hours. Lipid Profile: No results for input(s): CHOL, HDL, LDLCALC, TRIG, CHOLHDL, LDLDIRECT in the last 72 hours. Thyroid Function Tests: Recent Labs    06/18/20 0338  TSH 1.148   Anemia Panel: No results for input(s): VITAMINB12, FOLATE, FERRITIN, TIBC, IRON, RETICCTPCT in the last 72 hours. Sepsis Labs: No results for input(s): PROCALCITON, LATICACIDVEN in the last 168 hours.  Recent Results (from the past 240 hour(s))  MRSA PCR Screening     Status: None   Collection Time: 06/18/20  2:20 PM   Specimen: Nasopharyngeal  Result Value Ref Range Status   MRSA by PCR NEGATIVE NEGATIVE Final    Comment:         The GeneXpert MRSA Assay (FDA approved for NASAL specimens only), is one component of a comprehensive MRSA colonization surveillance program. It is not intended to diagnose MRSA infection nor to guide or monitor treatment for MRSA infections. Performed at [facility], [place] [address], [place], [place] [address]      Radiology Studies: NM Myocar Multi W/Spect W/Wall Motion / EF  Result Date: 06/19/2020 CLINICAL DATA:  Chest pain EXAM: MYOCARDIAL IMAGING WITH SPECT (REST AND PHARMACOLOGIC-STRESS) GATED LEFT VENTRICULAR WALL MOTION STUDY LEFT VENTRICULAR EJECTION FRACTION TECHNIQUE: Standard myocardial SPECT imaging was performed after resting intravenous injection of 11 mCi Tc-63mtetrofosmin. Subsequently, intravenous infusion of Lexiscan was performed under the supervision of the Cardiology staff. At peak effect of the drug, 33 mCi Tc-968metrofosmin was injected intravenously and standard myocardial SPECT imaging was performed. Quantitative gated imaging was also performed to evaluate left ventricular wall motion, and estimate left ventricular ejection fraction. COMPARISON:  None. FINDINGS: Perfusion: There is a fixed defect in the inferior wall extending from mid wall the apex. There is a mildly reversible defect accounting for approximately 10% of left ventricular wall affecting the anterior septal wall. No other perfusion abnormalities. Wall Motion: Global hypokinesis. No wall thickening in the region of the inferior wall fixed defect. Left Ventricular Ejection Fraction: 44 % End diastolic volume [id number] End systolic volume 91 ml IMPRESSION: 1. There is an infarct in the inferior wall extending from mid wall to apex. There is a moderate size reversible defect in the anterior septal wall towards the apex. 2. Global hypokinesis. No wall thickening in the region of infarct. Severely abnormal end-diastolic volume and end systolic volume. 3. Left ventricular ejection fraction 44% 4. Non  invasive risk stratification*: High *2012 Appropriate Use Criteria for Coronary Revascularization Focused Update: J Am Coll Cardiol. [id number]://content.onairportbarriers.comspx?articleid=1201161 These results will be called to the ordering clinician or representative by the Radiologist Assistant, and communication documented in the PACS or ClFront[employer] CorporationElectronically Signed   By: DaDorise BullionII M.D   On: 06/19/2020 14:15   ECHOCARDIOGRAM LIMITED  Result Date: 06/19/2020    ECHOCARDIOGRAM LIMITED REPORT   Patient Name:   SHJORDYAN SARTELLate of Exam: 06/19/2020 Medical Rec #:  [id number]      Height:       68.0 in Accession #:  [id number]       Weight:       218.0 lb Date of Birth:  March 05, 1966        BSA:          2.120 m Patient Age:    24 years         BP:           185/105 mmHg Patient Gender: F                HR:           64 bpm. Exam Location:  Inpatient Procedure: Limited Echo and Color Doppler Indications:    possible aortic valve vegetation  History:        Patient has prior history of Echocardiogram examinations, most                 recent 04/02/2020. End stage renal disease, Signs/Symptoms:Chest                 Pain; Risk Factors:Hypertension.  Sonographer:    Johny Chess Referring Phys: [id number] [place] [facility]  Sonographer Comments: Patient is morbidly obese. Image acquisition challenging due to patient body habitus. IMPRESSIONS  1. The aortic valve is tricuspid and the leaflets are moderately calcified. There is trivial aortic regurgitation. There is no apparent vegetation on the current study. It appears to just be calcium on the valve. Additionally, there is no significant regurgitation, which goes against a destructive lesion such as endocarditis. If there are clinical concerns for endocarditis, would recommend a TEE. The aortic valve is tricuspid. There is moderate calcification of the aortic valve. There is moderate thickening of the aortic valve. Aortic  valve regurgitation is trivial. Mild to moderate aortic valve sclerosis/calcification is present, without any evidence of aortic stenosis.  2. Left ventricular ejection fraction, by estimation, is 60 to 65%. The left ventricle has normal function. The left ventricle has no regional wall motion abnormalities.  3. A small pericardial effusion is present. The pericardial effusion is circumferential.  4. The mitral valve is degenerative. Mild mitral valve regurgitation. No evidence of mitral stenosis.  5. There is mildly elevated pulmonary artery systolic pressure. Comparison(s): No prior Echocardiogram. FINDINGS  Left Ventricle: Left ventricular ejection fraction, by estimation, is 60 to 65%. The left ventricle has normal function. The left ventricle has no regional wall motion abnormalities. Right Ventricle: There is mildly elevated pulmonary artery systolic pressure. The tricuspid regurgitant velocity is 3.01 m/s, and with an assumed right atrial pressure of 0 mmHg, the estimated right ventricular systolic pressure is 0000000 mmHg. Left Atrium: Left atrial size was normal in size. Right Atrium: Right atrial size was normal in size. Pericardium: A small pericardial effusion is present. The pericardial effusion is circumferential. Mitral Valve: The mitral valve is degenerative in appearance. Mild to moderate mitral annular calcification. Mild mitral valve regurgitation. No evidence of mitral valve stenosis. Tricuspid Valve: The tricuspid valve is grossly normal. Tricuspid valve regurgitation is trivial. No evidence of tricuspid stenosis. Aortic Valve: The aortic valve is tricuspid and the leaflets are moderately calcified. There is trivial aortic regurgitation. There is no apparent vegetation on the current study. It appears to just be calcium on the valve. Additionally, there is no significant regurgitation, which goes against a destructive lesion such as endocarditis. If there are clinical concerns for endocarditis,  would recommend a TEE. The aortic valve is tricuspid. There is moderate calcification of the aortic valve. There is moderate thickening of the aortic valve.  Aortic valve regurgitation is trivial. Mild to moderate aortic valve sclerosis/calcification is present, without any evidence of aortic stenosis. Pulmonic Valve: The pulmonic valve was grossly normal. Pulmonic valve regurgitation is trivial. No evidence of pulmonic stenosis. Aorta: The aortic root and ascending aorta are structurally normal, with no evidence of dilitation. LEFT VENTRICLE PLAX 2D LVIDd:         4.90 cm LVIDs:         3.20 cm LVOT diam:     2.20 cm LV SV:         69 LV SV Index:   33 LVOT Area:     3.80 cm  AORTIC VALVE LVOT Vmax:   88.80 cm/s LVOT Vmean:  55.100 cm/s LVOT VTI:    0.182 m  AORTA Ao Root diam: 2.80 cm Ao Asc diam:  3.20 cm MITRAL VALVE               TRICUSPID VALVE MV Area (PHT): 3.17 cm    TR Peak grad:   36.2 mmHg MV Decel Time: 239 msec    TR Vmax:        301.00 cm/s MV E velocity: 80.60 cm/s MV A velocity: 77.60 cm/s  SHUNTS MV E/A ratio:  1.04        Systemic VTI:  0.18 m                            Systemic Diam: 2.20 cm Eleonore Chiquito MD Electronically signed by Eleonore Chiquito MD Signature Date/Time: 06/19/2020/4:08:02 PM    Final    Scheduled Meds: . amLODipine  10 mg Oral Daily  . aspirin  81 mg Oral Pre-Cath  . atorvastatin  80 mg Oral q1800  . calcitRIOL  0.25 mcg Oral Daily  . carvedilol  25 mg Oral BID WC  . cinacalcet  30 mg Oral Q breakfast  . cloNIDine  0.1 mg Oral Daily  . escitalopram  10 mg Oral Daily  . furosemide  80 mg Oral BID  . heparin  3,000 Units Dialysis Once in dialysis  . pantoprazole  40 mg Oral Daily  . sevelamer carbonate  3,200 mg Oral TID with meals  . sodium chloride flush  3 mL Intravenous Q12H  . ticagrelor  90 mg Oral BID   Continuous Infusions: . sodium chloride    . sodium chloride    . sodium chloride 20 mL/hr at 06/20/20 0532  . sodium chloride    . sodium chloride     . sodium chloride    . sodium chloride    . heparin 1,050 Units/hr (06/19/20 1345)     LOS: 2 days    Time spent: 25 mins    Fredrick Geoghegan, MD [facility]   If 7PM-7AM, please contact night-coverage

## 2020-06-20 NOTE — Progress Notes (Signed)
Kept on NPO post mid night, reminded pt for scheduled TEE procedure

## 2020-06-20 NOTE — Progress Notes (Signed)
Transported to the cath lab by bed awake and alert. 

## 2020-06-20 NOTE — Progress Notes (Signed)
Site area: rt groin arterial site Site Prior to Removal:  Level 0 Pressure Applied For: 20 minutes Manual:   yes Patient Status During Pull:  stable Post Pull Site:  Level 0 Post Pull Instructions Given:  yes Post Pull Pulses Present: rt dp palpable Dressing Applied:  Gauze and teaderm Bedrest begins @ 1400 Comments:

## 2020-06-20 NOTE — Progress Notes (Signed)
Progress Note  Patient Name: Erin Dean Date of Encounter: 06/20/2020  [facility] Cardiologist: Skeet Latch, MD   Subjective   Denies any chest pain or SOB.  Myoview yesterday was high risk.    Notably, patient refusing general anesthesia for TEE but willing to proceed with moderate sedation. She reports having very poor response to general anesthesia in the past where she would not wake-up.  Inpatient Medications    Scheduled Meds: . amLODipine  10 mg Oral Daily  . atorvastatin  80 mg Oral q1800  . calcitRIOL  0.25 mcg Oral Daily  . carvedilol  25 mg Oral BID WC  . cinacalcet  30 mg Oral Q breakfast  . cloNIDine  0.1 mg Oral Daily  . escitalopram  10 mg Oral Daily  . furosemide  80 mg Oral BID  . heparin  3,000 Units Dialysis Once in dialysis  . pantoprazole  40 mg Oral Daily  . sevelamer carbonate  3,200 mg Oral TID with meals  . ticagrelor  90 mg Oral BID   Continuous Infusions: . sodium chloride    . sodium chloride    . sodium chloride 20 mL/hr at 06/20/20 0532  . heparin 1,050 Units/hr (06/19/20 1345)   PRN Meds: sodium chloride, sodium chloride, acetaminophen **OR** acetaminophen, ALPRAZolam, alteplase, heparin, lidocaine (PF), lidocaine-prilocaine, ondansetron **OR** ondansetron (ZOFRAN) IV, pentafluoroprop-tetrafluoroeth   Vital Signs    Vitals:   06/19/20 2300 06/20/20 0000 06/20/20 0300 06/20/20 0400  BP: (!) 92/46  (!) 98/56   Pulse: (!) 59 (!) 58 (!) 54 (!) 52  Resp: '18 19 18 18  '$ Temp: 98.1 F (36.7 C)  97.9 F (36.6 C)   TempSrc: Oral  Oral   SpO2: 94% 93% 96% 96%  Weight:      Height:        Intake/Output Summary (Last 24 hours) at 06/20/2020 0830 Last data filed at 06/20/2020 0600 Gross per 24 hour  Intake 865.4 ml  Output 0 ml  Net 865.4 ml   Last 3 Weights 06/17/2020 04/02/2020 04/01/2020  Weight (lbs) 218 lb 251 lb 1.7 oz 248 lb 4.9 oz  Weight (kg) 98.884 kg 113.9 kg 112.63 kg      Telemetry    NSR with PVCs-  Personally Reviewed  ECG    No new tracing - Personally Reviewed  Physical Exam   GEN: Well nourished, well developed in no acute distress HEENT: Normal NECK: No JVD; No carotid bruits LYMPHATICS: No lymphadenopathy CARDIAC:RRR, no murmurs, rubs, gallops RESPIRATORY:  Clear to auscultation without rales, wheezing or rhonchi  ABDOMEN: Soft, non-tender, non-distended MUSCULOSKELETAL:  No edema; No deformity  SKIN: Warm and dry NEUROLOGIC:  Alert and oriented x 3 PSYCHIATRIC:  Normal affect    Labs    High Sensitivity Troponin:   Recent Labs  Lab 06/17/20 2020 06/17/20 2225 06/18/20 0338  TROPONINIHS 174* 233* 367*      Chemistry Recent Labs  Lab 06/17/20 2020 06/18/20 0338 06/20/20 0027  NA 140 139 138  K 4.4 4.9 4.4  CL 97* 96* 97*  CO2 '27 29 27  '$ GLUCOSE 111* 101* 94  BUN 33* 41* 24*  CREATININE 8.61* 9.69* 7.47*  CALCIUM 8.8* 8.7* 8.7*  PROT 8.1  --   --   ALBUMIN 3.5  --   --   AST 17  --   --   ALT 11  --   --   ALKPHOS 66  --   --   BILITOT 0.2*  --   --  GFRNONAA 5* 4* 6*  ANIONGAP 16* 14 14     Hematology Recent Labs  Lab 06/18/20 0338 06/19/20 0741 06/20/20 0027  WBC 11.0* 13.1* 12.2*  RBC 2.79* 3.04* 2.85*  HGB 8.6* 9.2* 8.7*  HCT 27.6* 29.7* 28.0*  MCV 98.9 97.7 98.2  MCH 30.8 30.3 30.5  MCHC 31.2 31.0 31.1  RDW 14.6 15.1 15.3  PLT 186 228 265    BNPNo results for input(s): BNP, PROBNP in the last 168 hours.   DDimer No results for input(s): DDIMER in the last 168 hours.   Radiology    NM Myocar Multi W/Spect W/Wall Motion / EF  Result Date: 06/19/2020 CLINICAL DATA:  Chest pain EXAM: MYOCARDIAL IMAGING WITH SPECT (REST AND PHARMACOLOGIC-STRESS) GATED LEFT VENTRICULAR WALL MOTION STUDY LEFT VENTRICULAR EJECTION FRACTION TECHNIQUE: Standard myocardial SPECT imaging was performed after resting intravenous injection of 11 mCi Tc-38mtetrofosmin. Subsequently, intravenous infusion of Lexiscan was performed under the supervision of  the Cardiology staff. At peak effect of the drug, 33 mCi Tc-937metrofosmin was injected intravenously and standard myocardial SPECT imaging was performed. Quantitative gated imaging was also performed to evaluate left ventricular wall motion, and estimate left ventricular ejection fraction. COMPARISON:  None. FINDINGS: Perfusion: There is a fixed defect in the inferior wall extending from mid wall the apex. There is a mildly reversible defect accounting for approximately 10% of left ventricular wall affecting the anterior septal wall. No other perfusion abnormalities. Wall Motion: Global hypokinesis. No wall thickening in the region of the inferior wall fixed defect. Left Ventricular Ejection Fraction: 44 % End diastolic volume [id number] End systolic volume 91 ml IMPRESSION: 1. There is an infarct in the inferior wall extending from mid wall to apex. There is a moderate size reversible defect in the anterior septal wall towards the apex. 2. Global hypokinesis. No wall thickening in the region of infarct. Severely abnormal end-diastolic volume and end systolic volume. 3. Left ventricular ejection fraction 44% 4. Non invasive risk stratification*: High *2012 Appropriate Use Criteria for Coronary Revascularization Focused Update: J Am Coll Cardiol. [id number]://content.onairportbarriers.comspx?articleid=1201161 These results will be called to the ordering clinician or representative by the Radiologist Assistant, and communication documented in the PACS or ClFront[employer] CorporationElectronically Signed   By: DaDorise BullionII M.D   On: 06/19/2020 14:15   ECHOCARDIOGRAM LIMITED  Result Date: 06/19/2020    ECHOCARDIOGRAM LIMITED REPORT   Patient Name:   Erin GLOORate of Exam: 06/19/2020 Medical Rec #:  [id number]      Height:       68.0 in Accession #:    [id number]     Weight:       218.0 lb Date of Birth:  12April 27, 1967      BSA:          2.120 m Patient Age:    [age]         BP:            185/105 mmHg Patient Gender: F                HR:           64 bpm. Exam Location:  Inpatient Procedure: Limited Echo and Color Doppler Indications:    possible aortic valve vegetation  History:        Patient has prior history of Echocardiogram examinations, most  recent 04/02/2020. End stage renal disease, Signs/Symptoms:Chest                 Pain; Risk Factors:Hypertension.  Sonographer:    Johny Chess Referring Phys: [id number] [place] [employer]  Sonographer Comments: Patient is morbidly obese. Image acquisition challenging due to patient body habitus. IMPRESSIONS  1. The aortic valve is tricuspid and the leaflets are moderately calcified. There is trivial aortic regurgitation. There is no apparent vegetation on the current study. It appears to just be calcium on the valve. Additionally, there is no significant regurgitation, which goes against a destructive lesion such as endocarditis. If there are clinical concerns for endocarditis, would recommend a TEE. The aortic valve is tricuspid. There is moderate calcification of the aortic valve. There is moderate thickening of the aortic valve. Aortic valve regurgitation is trivial. Mild to moderate aortic valve sclerosis/calcification is present, without any evidence of aortic stenosis.  2. Left ventricular ejection fraction, by estimation, is 60 to 65%. The left ventricle has normal function. The left ventricle has no regional wall motion abnormalities.  3. A small pericardial effusion is present. The pericardial effusion is circumferential.  4. The mitral valve is degenerative. Mild mitral valve regurgitation. No evidence of mitral stenosis.  5. There is mildly elevated pulmonary artery systolic pressure. Comparison(s): No prior Echocardiogram. FINDINGS  Left Ventricle: Left ventricular ejection fraction, by estimation, is 60 to 65%. The left ventricle has normal function. The left ventricle has no regional wall motion abnormalities. Right  Ventricle: There is mildly elevated pulmonary artery systolic pressure. The tricuspid regurgitant velocity is 3.01 m/s, and with an assumed right atrial pressure of 0 mmHg, the estimated right ventricular systolic pressure is 0000000 mmHg. Left Atrium: Left atrial size was normal in size. Right Atrium: Right atrial size was normal in size. Pericardium: A small pericardial effusion is present. The pericardial effusion is circumferential. Mitral Valve: The mitral valve is degenerative in appearance. Mild to moderate mitral annular calcification. Mild mitral valve regurgitation. No evidence of mitral valve stenosis. Tricuspid Valve: The tricuspid valve is grossly normal. Tricuspid valve regurgitation is trivial. No evidence of tricuspid stenosis. Aortic Valve: The aortic valve is tricuspid and the leaflets are moderately calcified. There is trivial aortic regurgitation. There is no apparent vegetation on the current study. It appears to just be calcium on the valve. Additionally, there is no significant regurgitation, which goes against a destructive lesion such as endocarditis. If there are clinical concerns for endocarditis, would recommend a TEE. The aortic valve is tricuspid. There is moderate calcification of the aortic valve. There is moderate thickening of the aortic valve. Aortic valve regurgitation is trivial. Mild to moderate aortic valve sclerosis/calcification is present, without any evidence of aortic stenosis. Pulmonic Valve: The pulmonic valve was grossly normal. Pulmonic valve regurgitation is trivial. No evidence of pulmonic stenosis. Aorta: The aortic root and ascending aorta are structurally normal, with no evidence of dilitation. LEFT VENTRICLE PLAX 2D LVIDd:         4.90 cm LVIDs:         3.20 cm LVOT diam:     2.20 cm LV SV:         69 LV SV Index:   33 LVOT Area:     3.80 cm  AORTIC VALVE LVOT Vmax:   88.80 cm/s LVOT Vmean:  55.100 cm/s LVOT VTI:    0.182 m  AORTA Ao Root diam: 2.80 cm Ao Asc diam:   3.20 cm MITRAL VALVE  TRICUSPID VALVE MV Area (PHT): 3.17 cm    TR Peak grad:   36.2 mmHg MV Decel Time: 239 msec    TR Vmax:        301.00 cm/s MV E velocity: 80.60 cm/s MV A velocity: 77.60 cm/s  SHUNTS MV E/A ratio:  1.04        Systemic VTI:  0.18 m                            Systemic Diam: 2.20 cm Eleonore Chiquito MD Electronically signed by Eleonore Chiquito MD Signature Date/Time: 06/19/2020/4:08:02 PM    Final     Cardiac Studies   06/19/2020 IMPRESSION: 1. There is an infarct in the inferior wall extending from mid wall to apex. There is a moderate size reversible defect in the anterior septal wall towards the apex.  2. Global hypokinesis. No wall thickening in the region of infarct. Severely abnormal end-diastolic volume and end systolic volume.  3. Left ventricular ejection fraction 44%  4. Non invasive risk stratification*: High  ECHO 06/19/2020 IMPRESSIONS   1. The aortic valve is tricuspid and the leaflets are moderately  calcified. There is trivial aortic regurgitation. There is no apparent  vegetation on the current study. It appears to just be calcium on the  valve. Additionally, there is no significant  regurgitation, which goes against a destructive lesion such as  endocarditis. If there are clinical concerns for endocarditis, would  recommend a TEE. The aortic valve is tricuspid. There is moderate  calcification of the aortic valve. There is moderate  thickening of the aortic valve. Aortic valve regurgitation is trivial.  Mild to moderate aortic valve sclerosis/calcification is present, without  any evidence of aortic stenosis.  2. Left ventricular ejection fraction, by estimation, is 60 to 65%. The  left ventricle has normal function. The left ventricle has no regional  wall motion abnormalities.  3. A small pericardial effusion is present. The pericardial effusion is  circumferential.  4. The mitral valve is degenerative. Mild mitral valve  regurgitation. No  evidence of mitral stenosis.  5. There is mildly elevated pulmonary artery systolic pressure.   Comparison(s): No prior Echocardiogram.  Echo 04/02/20: 1. When compared to the prior study from 03/10/2018 aortc valve is now  thicker with a possible mobile echodensity attached to the left coronary  cusp. A TEE is recommended for further evaluation.  2. Left ventricular ejection fraction, by estimation, is 65 to 70%. The  left ventricle has normal function. The left ventricle has no regional  wall motion abnormalities. Left ventricular diastolic parameters are  consistent with Grade II diastolic  dysfunction (pseudonormalization). Elevated left atrial pressure.  3. Right ventricular systolic function is normal. The right ventricular  size is normal. There is mildly elevated pulmonary artery systolic  pressure.  4. Left atrial size was mildly dilated.  5. The mitral valve is normal in structure. No evidence of mitral valve  regurgitation. No evidence of mitral stenosis.  6. The aortic valve is normal in structure. There is mild calcification  of the aortic valve. There is severe thickening of the aortic valve.  Aortic valve regurgitation is mild. No aortic stenosis is present.  7. The inferior vena cava is normal in size with greater than 50%  respiratory variability, suggesting right atrial pressure of 3 mmHg.   Patient Profile     55 y.o. female with a history of ESRD on HD, poorly controlled HTN,  noncompliance, morbid obesity, chronic diastolic heart failure, and CVA (2019, 2021) who presented with chest pain found to be in SVT with elevated troponin for which cardiology has been consulted.  Assessment & Plan    #Paroxysmal Atrial fibrillation with RVR -Patient presented with chest pain and rapid afib with RVR -she continues to maintain NSR on exam today -Continue coreg '25mg'$  BID -Continue heparin gtt for [facility] for now pending plans for cath -transition to  apixaban prior to discharge  #Concern for aortic valve vegetation: Patient noted to have thicker aortic valve with possible mobile echodensity on the [place] on surface TTE in 03/2020. TEE deferred at that time due to [place] and patient was recommended to follow-up, but she has not.  -patient adamently refused general sedation at this time as had bad experience with it in the past where she "would not wake up" and she "almost died." She is amenable to proceed with moderate sedation, however.  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 Resolved with conversion to NSR. HS troponin 174 --> 233 --> 367. ECG with TWI that were not present on priors. Suspect demand, however, patient has multiple risk factors for CAD.  Leane Call was high risk showing infarct in the inferior wall extending from mid wall to apex with moderate size reversible defect in the anterior septal wall towards the apex. -will plan for cath today as she was already NPO for TEE which has been cancelled -Continue heparin gtt for AC, coreg '25mg'$  BID, lipitor '80mg'$  daily -Continue ASA and ticagrelor which patient was taking for recurrent stroke -If needs triple therapy in future, likely transition to ASA, plavix, apixaban -Shared Decision Making/Informed Consent The risks [stroke (1 in 1000), death (1 in 1000), kidney failure [usually temporary] (1 in 500), bleeding (1 in 200), allergic reaction [possibly serious] (1 in 200)],  benefits (diagnostic support and management of coronary artery disease) and alternatives of a cardiac catheterization were discussed in detail with Ms. Hyder and she is willing to proceed.  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   -TEE in 2019 for CVA showed no PFO by bubble study -Currently on ASA and ticagrelor. -Continue DAPT as above  #Anemia of chronic disease #ESRD on HD - TTS - per primary  I have spent a total of 35 minutes with patient reviewing 2D echo from 03/2020 and 06/2020 , telemetry, EKGs, labs and examining patient as well as establishing an assessment and plan that was discussed with the patient.  > 50% of time was spent in direct patient care including risks of cath.  For questions or updates, please contact [place] Please consult [email or web address] for  contact info under      Signed, Fransico Him, MD  06/20/2020, 8:30 AM

## 2020-06-20 NOTE — Progress Notes (Signed)
Back from the cath lab by  bed awake and alert. right groin dressing intact, site soft to touch Instructed no to bend affected leg and on bedrest till 6 pm today.

## 2020-06-20 NOTE — Progress Notes (Signed)
Dr. Radford Pax claimed that pt is not going for TEE today but scheduled to go for cardiac cath. Prep. done and  consent signed. Kept NPO.

## 2020-06-20 NOTE — Progress Notes (Addendum)
[place] for heparin Indication: atrial fibrillation  Allergies  Allergen Reactions  . Chlorhexidine Gluconate Rash    Patient Measurements: Height: '5\' 8"'$  (172.7 cm) Weight: 98.9 kg (218 lb) IBW/kg (Calculated) : 63.9 Heparin Dosing Weight: 85kg  Vital Signs: Temp: 97.9 F (36.6 C) (03/14 0300) Temp Source: Oral (03/14 0300) BP: 98/56 (03/14 0300) Pulse Rate: 52 (03/14 0400)  Labs: Recent Labs    06/17/20 2020 06/17/20 2225 06/18/20 0338 06/19/20 0741 06/19/20 1957 06/20/20 0027  HGB 9.5*  --  8.6* 9.2*  --  8.7*  HCT 29.5*  --  27.6* 29.7*  --  28.0*  PLT 199  --  186 228  --  265  HEPARINUNFRC  --   --   --  0.85* 0.47 0.54  CREATININE 8.61*  --  9.69*  --   --  7.47*  TROPONINIHS 174* 233* 367*  --   --   --     Estimated Creatinine Clearance: 10.5 mL/min (A) (by C-G formula based on SCr of 7.47 mg/dL (H)).   Medical History: Past Medical History:  Diagnosis Date  . ESRD (end stage renal disease) ([place])    [facility] [place]   . Hypertension   . Stroke ([place]) 04/2017   no residual . Limp left side  . SVT (supraventricular tachycardia) ([facility])     Assessment: 52 yoF admitted with AFib RVR. No AC PTA. Pharmacy to begin heparin infusion.  Pt s/p cath, pharmacy asked to restart heparin 8h after sheath pull (1400).  Goal of Therapy:  Heparin level 0.3-0.7 units/ml Monitor platelets by anticoagulation protocol: Yes   Plan:  Restart heparin 1050 units/h no bolus at 2200 Daily heparin level and CBC  Arrie Senate, PharmD, [place], [facility] Clinical Pharmacist [phone] Please check AMION for all [facility] Pharmacy numbers 06/20/2020

## 2020-06-21 DIAGNOSIS — I200 Unstable angina: Secondary | ICD-10-CM | POA: Diagnosis not present

## 2020-06-21 DIAGNOSIS — R0789 Other chest pain: Secondary | ICD-10-CM | POA: Diagnosis not present

## 2020-06-21 DIAGNOSIS — R778 Other specified abnormalities of plasma proteins: Secondary | ICD-10-CM | POA: Diagnosis not present

## 2020-06-21 DIAGNOSIS — N186 End stage renal disease: Secondary | ICD-10-CM | POA: Diagnosis not present

## 2020-06-21 LAB — PHOSPHORUS: Phosphorus: 7.1 mg/dL — ABNORMAL HIGH (ref 2.5–4.6)

## 2020-06-21 LAB — CBC
HCT: 28.5 % — ABNORMAL LOW (ref 36.0–46.0)
Hemoglobin: 8.6 g/dL — ABNORMAL LOW (ref 12.0–15.0)
MCH: 30.1 pg (ref 26.0–34.0)
MCHC: 30.2 g/dL (ref 30.0–36.0)
MCV: 99.7 fL (ref 80.0–100.0)
Platelets: 271 10*3/uL (ref 150–400)
RBC: 2.86 MIL/uL — ABNORMAL LOW (ref 3.87–5.11)
RDW: 15.6 % — ABNORMAL HIGH (ref 11.5–15.5)
WBC: 10 10*3/uL (ref 4.0–10.5)
nRBC: 0 % (ref 0.0–0.2)

## 2020-06-21 LAB — HEPARIN LEVEL (UNFRACTIONATED): Heparin Unfractionated: 0.72 IU/mL — ABNORMAL HIGH (ref 0.30–0.70)

## 2020-06-21 LAB — BASIC METABOLIC PANEL
Anion gap: 14 (ref 5–15)
BUN: 35 mg/dL — ABNORMAL HIGH (ref 6–20)
CO2: 26 mmol/L (ref 22–32)
Calcium: 8.6 mg/dL — ABNORMAL LOW (ref 8.9–10.3)
Chloride: 99 mmol/L (ref 98–111)
Creatinine, Ser: 10 mg/dL — ABNORMAL HIGH (ref 0.44–1.00)
GFR, Estimated: 4 mL/min — ABNORMAL LOW (ref >60)
Glucose, Bld: 85 mg/dL (ref 70–99)
Potassium: 5 mmol/L (ref 3.5–5.1)
Sodium: 139 mmol/L (ref 135–145)

## 2020-06-21 LAB — MAGNESIUM: Magnesium: 2.2 mg/dL (ref 1.7–2.4)

## 2020-06-21 NOTE — Progress Notes (Signed)
[facility] ROUNDING NOTE   Subjective:   Brief history: 55 year old lady with end-stage renal disease Tuesday Thursday Saturday dialysis, history of hypertension diastolic heart failure multiple CVAs.  She was admitted     06/17/2020 with chest pain.  She is scheduled to undergo TEE for suspected aortic valve vegetation and high risk Myoview.  She dialyzes at [facility] Tuesday Thursday Saturday schedule.  Her last dialysis 06/19/2020 with 2 L removed.  Status post cardiac catheterization 06/20/2020.  Appreciate assistance of cardiology. Single vessel coronary disease 95% ostial/proximal LAD stenosis with heavy calcification.  Recommendations are for aggressive secondary prevention  Blood pressure 146/76 pulse 50 temperature 97.6 O2 sats 94% room air  Sodium 140 potassium 4.4 chloride 100 CO2 22 BUN 26 creatinine 8.5 glucose 98 calcium 8.4 hemoglobin 8.4  Medications: Amlodipine 10 mg daily, atorvastatin 80 mg daily calcitriol 0.25 mcg daily Coreg 25 mg twice daily Sensipar 30 mg daily, clonidine 0.1 mg 3 times daily, Lexapro 10 mg daily, Lasix 80 mg twice daily, hydralazine 100 mg 3 times daily Protonix 40 mg daily Renvela 3.2 g with meals, Brilinta 90 mg twice daily                      Objective:  Vital signs in last 24 hours:  Temp:  [97.6 F (36.4 C)-98 F (36.7 C)] 97.6 F (36.4 C) (03/15 0300) Pulse Rate:  [47-71] 54 (03/15 0300) Resp:  [12-27] 19 (03/15 0300) BP: (102-155)/(47-71) 126/58 (03/15 0300) SpO2:  [93 %-100 %] 96 % (03/14 2300)  Weight change:  Filed Weights   06/17/20 1937  Weight: 98.9 kg    Intake/Output: I/O last 3 completed shifts: In: 1167.9 [P.O.:780; I.V.:387.9] Out: 0    Intake/Output this shift:  Total I/O In: 1.2 [I.V.:1.2] Out: -   General: WDWN NAD Head: NCAT sclera not icteric MMM Neck: Supple. No JVD No masses Lungs: CTA bilaterally without wheezes, rales, or rhonchi. Breathing is unlabored. Heart: RRR with S1  S2 Abdomen: soft NT + BS Lower extremities:without edema or ischemic changes, no open wounds  Neuro: A & O  X 3. Moves all extremities spontaneously. Psych:  Responds to questions appropriately with a normal affect. Dialysis Access: LUE AVF +bruit    Basic Metabolic Panel: Recent Labs  Lab 06/17/20 2020 06/18/20 0338 06/20/20 0027 06/20/20 1106  NA 140 139 138 140  K 4.4 4.9 4.4 4.4  CL 97* 96* 97* 100  CO2 '27 29 27 26  '$ GLUCOSE 111* 101* 94 98  BUN 33* 41* 24* 26*  CREATININE 8.61* 9.69* 7.47* 8.50*  CALCIUM 8.8* 8.7* 8.7* 8.4*  PHOS  --   --   --  6.2*    Liver Function Tests: Recent Labs  Lab 06/17/20 2020 06/20/20 1106  AST 17  --   ALT 11  --   ALKPHOS 66  --   BILITOT 0.2*  --   PROT 8.1  --   ALBUMIN 3.5 3.1*   Recent Labs  Lab 06/17/20 2020  LIPASE 44   No results for input(s): AMMONIA in the last 168 hours.  CBC: Recent Labs  Lab 06/17/20 2020 06/18/20 0338 06/19/20 0741 06/20/20 0027 06/20/20 1106  WBC 10.1 11.0* 13.1* 12.2* 9.5  NEUTROABS 7.1  --   --   --   --   HGB 9.5* 8.6* 9.2* 8.7* 8.4*  HCT 29.5* 27.6* 29.7* 28.0* 27.5*  MCV 97.4 98.9 97.7 98.2 99.3  PLT 199 186 228  265 250    Cardiac Enzymes: No results for input(s): CKTOTAL, CKMB, CKMBINDEX, TROPONINI in the last 168 hours.  BNP: Invalid input(s): POCBNP  CBG: No results for input(s): GLUCAP in the last 168 hours.  Microbiology: Results for orders placed or performed during the hospital encounter of 06/17/20  MRSA PCR Screening     Status: None   Collection Time: 06/18/20  2:20 PM   Specimen: Nasopharyngeal  Result Value Ref Range Status   MRSA by PCR NEGATIVE NEGATIVE Final    Comment:        The GeneXpert MRSA Assay (FDA approved for NASAL specimens only), is one component of a comprehensive MRSA colonization surveillance program. It is not intended to diagnose MRSA infection nor to guide or monitor treatment for MRSA infections. Performed at [facility], [place] [address], [place], [place] [address]     Coagulation Studies: No results for input(s): LABPROT, INR in the last 72 hours.  Urinalysis: No results for input(s): COLORURINE, LABSPEC, PHURINE, GLUCOSEU, HGBUR, BILIRUBINUR, KETONESUR, PROTEINUR, UROBILINOGEN, NITRITE, LEUKOCYTESUR in the last 72 hours.  Invalid input(s): APPERANCEUR    Imaging: CARDIAC CATHETERIZATION  Result Date: 06/20/2020 Conclusions: 1. Severe single-vessel coronary artery disease with 95% ostial/proximal LAD stenosis with heavy calcification.  Mild to moderate CAD also noted in mid/distal LAD, D1, ramus, intermedius, large OM1, codominant LCx, and RCA. 2. Normal left ventricular filling pressure. Recommendations: 1. Images reviewed with interventional team and Dr. Radford Pax.  Given multiple comorbitities, including recurrent CVA and ESRD, patient is not a good candidate for CABG.  Additionally, she has refused procedures necessitating general anesthesia.  We agree that the most favorable plan would be to proceed with atherectomy/PCI of the ostial/proximal LAD. 2. Stop ticagrelor after tonight's dose and load with clopidogrel 300 mg x 1 tomorrow morning, followed by 75 mg daily thereafter.  Anticipate discharge on apixaban and clopidogrel. 3. Restart IV heparin in the setting of paroxysmal atrial fibrillation and NSTEMI 8 hours after right femoral artery sheath removed. 4. Remove right femoral artery sheath with manual compression. 5. Aggressive secondary prevention. Nelva Bush, MD [facility]   NM Myocar Multi W/Spect Tamela Oddi Motion / EF  Result Date: 06/19/2020 CLINICAL DATA:  Chest pain EXAM: MYOCARDIAL IMAGING WITH SPECT (REST AND PHARMACOLOGIC-STRESS) GATED LEFT VENTRICULAR WALL MOTION STUDY LEFT VENTRICULAR EJECTION FRACTION TECHNIQUE: Standard myocardial SPECT imaging was performed after resting intravenous injection of 11 mCi Tc-62mtetrofosmin. Subsequently, intravenous infusion of Lexiscan was  performed under the supervision of the Cardiology staff. At peak effect of the drug, 33 mCi Tc-962metrofosmin was injected intravenously and standard myocardial SPECT imaging was performed. Quantitative gated imaging was also performed to evaluate left ventricular wall motion, and estimate left ventricular ejection fraction. COMPARISON:  None. FINDINGS: Perfusion: There is a fixed defect in the inferior wall extending from mid wall the apex. There is a mildly reversible defect accounting for approximately 10% of left ventricular wall affecting the anterior septal wall. No other perfusion abnormalities. Wall Motion: Global hypokinesis. No wall thickening in the region of the inferior wall fixed defect. Left Ventricular Ejection Fraction: 44 % End diastolic volume [id number] End systolic volume 91 ml IMPRESSION: 1. There is an infarct in the inferior wall extending from mid wall to apex. There is a moderate size reversible defect in the anterior septal wall towards the apex. 2. Global hypokinesis. No wall thickening in the region of infarct. Severely abnormal end-diastolic volume and end systolic volume. 3. Left ventricular ejection fraction  44% 4. Non invasive risk stratification*: High *2012 Appropriate Use Criteria for Coronary Revascularization Focused Update: J Am Coll Cardiol. N6492421. http://content.airportbarriers.com.aspx?articleid=1201161 These results will be called to the ordering clinician or representative by the Radiologist Assistant, and communication documented in the PACS or [employer]. Electronically Signed   By: Dorise Bullion III M.D   On: 06/19/2020 14:15   ECHOCARDIOGRAM LIMITED  Result Date: 06/19/2020    ECHOCARDIOGRAM LIMITED REPORT   Patient Name:   Erin Dean Date of Exam: 06/19/2020 Medical Rec #:  [id number]        Height:       68.0 in Accession #:    [id number]       Weight:       218.0 lb Date of Birth:  April 27, 1965        BSA:          2.120 m Patient Age:     79 years         BP:           185/105 mmHg Patient Gender: F                HR:           64 bpm. Exam Location:  Inpatient Procedure: Limited Echo and Color Doppler Indications:    possible aortic valve vegetation  History:        Patient has prior history of Echocardiogram examinations, most                 recent 04/02/2020. End stage renal disease, Signs/Symptoms:Chest                 Pain; Risk Factors:Hypertension.  Sonographer:    Johny Chess Referring Phys: [id number] [place] [facility]  Sonographer Comments: Patient is morbidly obese. Image acquisition challenging due to patient body habitus. IMPRESSIONS  1. The aortic valve is tricuspid and the leaflets are moderately calcified. There is trivial aortic regurgitation. There is no apparent vegetation on the current study. It appears to just be calcium on the valve. Additionally, there is no significant regurgitation, which goes against a destructive lesion such as endocarditis. If there are clinical concerns for endocarditis, would recommend a TEE. The aortic valve is tricuspid. There is moderate calcification of the aortic valve. There is moderate thickening of the aortic valve. Aortic valve regurgitation is trivial. Mild to moderate aortic valve sclerosis/calcification is present, without any evidence of aortic stenosis.  2. Left ventricular ejection fraction, by estimation, is 60 to 65%. The left ventricle has normal function. The left ventricle has no regional wall motion abnormalities.  3. A small pericardial effusion is present. The pericardial effusion is circumferential.  4. The mitral valve is degenerative. Mild mitral valve regurgitation. No evidence of mitral stenosis.  5. There is mildly elevated pulmonary artery systolic pressure. Comparison(s): No prior Echocardiogram. FINDINGS  Left Ventricle: Left ventricular ejection fraction, by estimation, is 60 to 65%. The left ventricle has normal function. The left ventricle has no regional wall  motion abnormalities. Right Ventricle: There is mildly elevated pulmonary artery systolic pressure. The tricuspid regurgitant velocity is 3.01 m/s, and with an assumed right atrial pressure of 0 mmHg, the estimated right ventricular systolic pressure is 0000000 mmHg. Left Atrium: Left atrial size was normal in size. Right Atrium: Right atrial size was normal in size. Pericardium: A small pericardial effusion is present. The pericardial effusion is circumferential. Mitral Valve: The mitral valve is degenerative in appearance. Mild to moderate  mitral annular calcification. Mild mitral valve regurgitation. No evidence of mitral valve stenosis. Tricuspid Valve: The tricuspid valve is grossly normal. Tricuspid valve regurgitation is trivial. No evidence of tricuspid stenosis. Aortic Valve: The aortic valve is tricuspid and the leaflets are moderately calcified. There is trivial aortic regurgitation. There is no apparent vegetation on the current study. It appears to just be calcium on the valve. Additionally, there is no significant regurgitation, which goes against a destructive lesion such as endocarditis. If there are clinical concerns for endocarditis, would recommend a TEE. The aortic valve is tricuspid. There is moderate calcification of the aortic valve. There is moderate thickening of the aortic valve. Aortic valve regurgitation is trivial. Mild to moderate aortic valve sclerosis/calcification is present, without any evidence of aortic stenosis. Pulmonic Valve: The pulmonic valve was grossly normal. Pulmonic valve regurgitation is trivial. No evidence of pulmonic stenosis. Aorta: The aortic root and ascending aorta are structurally normal, with no evidence of dilitation. LEFT VENTRICLE PLAX 2D LVIDd:         4.90 cm LVIDs:         3.20 cm LVOT diam:     2.20 cm LV SV:         69 LV SV Index:   33 LVOT Area:     3.80 cm  AORTIC VALVE LVOT Vmax:   88.80 cm/s LVOT Vmean:  55.100 cm/s LVOT VTI:    0.182 m  AORTA Ao  Root diam: 2.80 cm Ao Asc diam:  3.20 cm MITRAL VALVE               TRICUSPID VALVE MV Area (PHT): 3.17 cm    TR Peak grad:   36.2 mmHg MV Decel Time: 239 msec    TR Vmax:        301.00 cm/s MV E velocity: 80.60 cm/s MV A velocity: 77.60 cm/s  SHUNTS MV E/A ratio:  1.04        Systemic VTI:  0.18 m                            Systemic Diam: 2.20 cm Eleonore Chiquito MD Electronically signed by Eleonore Chiquito MD Signature Date/Time: 06/19/2020/4:08:02 PM    Final      Medications:   . sodium chloride    . sodium chloride    . sodium chloride    . sodium chloride    . sodium chloride    . heparin 1,050 Units/hr (06/20/20 2201)   . amLODipine  10 mg Oral Daily  . atorvastatin  80 mg Oral q1800  . calcitRIOL  0.25 mcg Oral Daily  . carvedilol  25 mg Oral BID WC  . cinacalcet  30 mg Oral Q breakfast  . cloNIDine  0.1 mg Oral Daily  . clopidogrel  300 mg Oral Once  . [START ON 06/22/2020] clopidogrel  75 mg Oral QAC breakfast  . escitalopram  10 mg Oral Daily  . furosemide  80 mg Oral BID  . heparin  3,000 Units Dialysis Once in dialysis  . pantoprazole  40 mg Oral Daily  . sevelamer carbonate  3,200 mg Oral TID with meals  . sodium chloride flush  3 mL Intravenous Q12H  . ticagrelor  90 mg Oral BID   sodium chloride, sodium chloride, sodium chloride, sodium chloride, sodium chloride, acetaminophen **OR** acetaminophen, ALPRAZolam, alteplase, heparin, lidocaine (PF), lidocaine-prilocaine, ondansetron **OR** ondansetron (ZOFRAN) IV, pentafluoroprop-tetrafluoroeth, sodium chloride flush  Assessment/ Plan:  Dialysis Orders:  [facility] TTS 4h 400/500 EDW 108 kg 2K/2Ca UFP 4  AVF Heparin 3000  Sensipar 180 TIW, Calcitriol 3.75 TIW, Mircera 60 q 2 wks (last 3/10)   Assessment/Plan: 1. Chest pain/elevated troponin --Cardiology following. Concern for aortic valve vegetation on outpatient Echo. Plan for Lexiscan and TEE.  Status post cardiac catheterization 06/20/2020 with singles vessel coronary disease  95% ostial/proximal LAD stenosis with heavy calcification.  Recommendations are for aggressive secondary prevention 2. AFib - with RVR on admission. Controlled. On Coreg 25 bid. Heparin per pharmacy.  3. ESRD -  HD TTS. Continue on schedule. Next HD 3/15.  4. Hypertension/volume  -have discontinued hydralazine and reduced dose of clonidine.  Blood pressures appear improved this morning.  Patient continues on Lasix despite the fact no documented urine output.  Will discontinue Lasix. 5. Anemia  - Hgb 9.2   Recent ESA dose as outpatient. Follow trends .    6. Metabolic bone disease -  Continue binders, Sensipar, Calcitriol. Follow trends here.  7. Nutrition - Renal diet with fluid restriction  8. Hx prior CVA     LOS: [place] '@TODAY''@6'$ :42 AM

## 2020-06-21 NOTE — Progress Notes (Addendum)
Progress Note  Patient Name: Erin Dean Date of Encounter: 06/21/2020  [facility] Cardiologist: Skeet Latch, MD   Subjective   Currently in HD  Cath yesterday showed severe 95% ostial/proximal LAD stenosis with heavy calcification and mild to moderate CAD in mid to distal LAD/D1/Ramus/Large OM1, RCA and codominant LCx.    Currently with no CP or SOB.   Inpatient Medications    Scheduled Meds: . amLODipine  10 mg Oral Daily  . atorvastatin  80 mg Oral q1800  . calcitRIOL  0.25 mcg Oral Daily  . carvedilol  25 mg Oral BID WC  . cinacalcet  30 mg Oral Q breakfast  . cloNIDine  0.1 mg Oral Daily  . [START ON 06/22/2020] clopidogrel  75 mg Oral QAC breakfast  . escitalopram  10 mg Oral Daily  . heparin  3,000 Units Dialysis Once in dialysis  . pantoprazole  40 mg Oral Daily  . sevelamer carbonate  3,200 mg Oral TID with meals  . sodium chloride flush  3 mL Intravenous Q12H  . ticagrelor  90 mg Oral BID   Continuous Infusions: . sodium chloride    . sodium chloride    . sodium chloride    . sodium chloride    . sodium chloride    . heparin 1,050 Units/hr (06/20/20 2201)   PRN Meds: sodium chloride, sodium chloride, sodium chloride, sodium chloride, sodium chloride, acetaminophen **OR** acetaminophen, ALPRAZolam, alteplase, heparin, lidocaine (PF), lidocaine-prilocaine, ondansetron **OR** ondansetron (ZOFRAN) IV, pentafluoroprop-tetrafluoroeth, sodium chloride flush   Vital Signs    Vitals:   06/21/20 0751 06/21/20 0840 06/21/20 0845 06/21/20 0850  BP: 114/61  (!) 98/57 105/62  Pulse: (!) 53  (!) 57 (!) 56  Resp: 15  17 (!) 22  Temp: 97.9 F (36.6 C)  98.5 F (36.9 C)   TempSrc: Oral  Oral   SpO2: 93%     Weight:  110 kg    Height:        Intake/Output Summary (Last 24 hours) at 06/21/2020 0950 Last data filed at 06/21/2020 0800 Gross per 24 hour  Intake 473.24 ml  Output --  Net 473.24 ml   Last 3 Weights 06/21/2020 06/17/2020 04/02/2020   Weight (lbs) 242 lb 8.1 oz 218 lb 251 lb 1.7 oz  Weight (kg) 110 kg 98.884 kg 113.9 kg      Telemetry    NSR with PVCs - Personally Reviewed  ECG    No new tracing - Personally Reviewed  Physical Exam   GEN: Well nourished, well developed in no acute distress HEENT: Normal NECK: No JVD; No carotid bruits LYMPHATICS: No lymphadenopathy CARDIAC:RRR, no murmurs, rubs, gallops RESPIRATORY:  Clear to auscultation without rales, wheezing or rhonchi  ABDOMEN: Soft, non-tender, non-distended MUSCULOSKELETAL:  No edema; No deformity  SKIN: Warm and dry NEUROLOGIC:  Alert and oriented x 3 PSYCHIATRIC:  Normal affect     Labs    High Sensitivity Troponin:   Recent Labs  Lab 06/17/20 2020 06/17/20 2225 06/18/20 0338  TROPONINIHS 174* 233* 367*      Chemistry Recent Labs  Lab 06/17/20 2020 06/18/20 0338 06/20/20 0027 06/20/20 1106 06/21/20 0554  NA 140   < > 138 140 139  K 4.4   < > 4.4 4.4 5.0  CL 97*   < > 97* 100 99  CO2 27   < > '27 26 26  '$ GLUCOSE 111*   < > 94 98 85  BUN 33*   < >  24* 26* 35*  CREATININE 8.61*   < > 7.47* 8.50* 10.00*  CALCIUM 8.8*   < > 8.7* 8.4* 8.6*  PROT 8.1  --   --   --   --   ALBUMIN 3.5  --   --  3.1*  --   AST 17  --   --   --   --   ALT 11  --   --   --   --   ALKPHOS 66  --   --   --   --   BILITOT 0.2*  --   --   --   --   GFRNONAA 5*   < > 6* 5* 4*  ANIONGAP 16*   < > '14 14 14   '$ < > = values in this interval not displayed.     Hematology Recent Labs  Lab 06/20/20 0027 06/20/20 1106 06/21/20 0554  WBC 12.2* 9.5 10.0  RBC 2.85* 2.77* 2.86*  HGB 8.7* 8.4* 8.6*  HCT 28.0* 27.5* 28.5*  MCV 98.2 99.3 99.7  MCH 30.5 30.3 30.1  MCHC 31.1 30.5 30.2  RDW 15.3 15.4 15.6*  PLT 265 250 271    BNPNo results for input(s): BNP, PROBNP in the last 168 hours.   DDimer No results for input(s): DDIMER in the last 168 hours.   Radiology    CARDIAC CATHETERIZATION  Result Date: 06/20/2020 Conclusions: 1. Severe single-vessel  coronary artery disease with 95% ostial/proximal LAD stenosis with heavy calcification.  Mild to moderate CAD also noted in mid/distal LAD, D1, ramus, intermedius, large OM1, codominant LCx, and RCA. 2. Normal left ventricular filling pressure. Recommendations: 1. Images reviewed with interventional team and Dr. Radford Pax.  Given multiple comorbitities, including recurrent CVA and ESRD, patient is not a good candidate for CABG.  Additionally, she has refused procedures necessitating general anesthesia.  We agree that the most favorable plan would be to proceed with atherectomy/PCI of the ostial/proximal LAD. 2. Stop ticagrelor after tonight's dose and load with clopidogrel 300 mg x 1 tomorrow morning, followed by 75 mg daily thereafter.  Anticipate discharge on apixaban and clopidogrel. 3. Restart IV heparin in the setting of paroxysmal atrial fibrillation and NSTEMI 8 hours after right femoral artery sheath removed. 4. Remove right femoral artery sheath with manual compression. 5. Aggressive secondary prevention. Nelva Bush, MD [facility]   NM Myocar Multi W/Spect Tamela Oddi Motion / EF  Result Date: 06/19/2020 CLINICAL DATA:  Chest pain EXAM: MYOCARDIAL IMAGING WITH SPECT (REST AND PHARMACOLOGIC-STRESS) GATED LEFT VENTRICULAR WALL MOTION STUDY LEFT VENTRICULAR EJECTION FRACTION TECHNIQUE: Standard myocardial SPECT imaging was performed after resting intravenous injection of 11 mCi Tc-36mtetrofosmin. Subsequently, intravenous infusion of Lexiscan was performed under the supervision of the Cardiology staff. At peak effect of the drug, 33 mCi Tc-953metrofosmin was injected intravenously and standard myocardial SPECT imaging was performed. Quantitative gated imaging was also performed to evaluate left ventricular wall motion, and estimate left ventricular ejection fraction. COMPARISON:  None. FINDINGS: Perfusion: There is a fixed defect in the inferior wall extending from mid wall the apex. There is a mildly  reversible defect accounting for approximately 10% of left ventricular wall affecting the anterior septal wall. No other perfusion abnormalities. Wall Motion: Global hypokinesis. No wall thickening in the region of the inferior wall fixed defect. Left Ventricular Ejection Fraction: 44 % End diastolic volume [id number] End systolic volume 91 ml IMPRESSION: 1. There is an infarct in the inferior wall extending from mid wall to  apex. There is a moderate size reversible defect in the anterior septal wall towards the apex. 2. Global hypokinesis. No wall thickening in the region of infarct. Severely abnormal end-diastolic volume and end systolic volume. 3. Left ventricular ejection fraction 44% 4. Non invasive risk stratification*: High *2012 Appropriate Use Criteria for Coronary Revascularization Focused Update: J Am Coll Cardiol. N6492421. http://content.airportbarriers.com.aspx?articleid=1201161 These results will be called to the ordering clinician or representative by the Radiologist Assistant, and communication documented in the PACS or [employer]. Electronically Signed   By: Dorise Bullion III M.D   On: 06/19/2020 14:15   ECHOCARDIOGRAM LIMITED  Result Date: 06/19/2020    ECHOCARDIOGRAM LIMITED REPORT   Patient Name:   ANAISHA ROARK Date of Exam: 06/19/2020 Medical Rec #:  [id number]        Height:       68.0 in Accession #:    [id number]       Weight:       218.0 lb Date of Birth:  10-25-1965        BSA:          2.120 m Patient Age:    55 years         BP:           185/105 mmHg Patient Gender: F                HR:           64 bpm. Exam Location:  Inpatient Procedure: Limited Echo and Color Doppler Indications:    possible aortic valve vegetation  History:        Patient has prior history of Echocardiogram examinations, most                 recent 04/02/2020. End stage renal disease, Signs/Symptoms:Chest                 Pain; Risk Factors:Hypertension.  Sonographer:    Johny Chess  Referring Phys: [id number] [place] [facility]  Sonographer Comments: Patient is morbidly obese. Image acquisition challenging due to patient body habitus. IMPRESSIONS  1. The aortic valve is tricuspid and the leaflets are moderately calcified. There is trivial aortic regurgitation. There is no apparent vegetation on the current study. It appears to just be calcium on the valve. Additionally, there is no significant regurgitation, which goes against a destructive lesion such as endocarditis. If there are clinical concerns for endocarditis, would recommend a TEE. The aortic valve is tricuspid. There is moderate calcification of the aortic valve. There is moderate thickening of the aortic valve. Aortic valve regurgitation is trivial. Mild to moderate aortic valve sclerosis/calcification is present, without any evidence of aortic stenosis.  2. Left ventricular ejection fraction, by estimation, is 60 to 65%. The left ventricle has normal function. The left ventricle has no regional wall motion abnormalities.  3. A small pericardial effusion is present. The pericardial effusion is circumferential.  4. The mitral valve is degenerative. Mild mitral valve regurgitation. No evidence of mitral stenosis.  5. There is mildly elevated pulmonary artery systolic pressure. Comparison(s): No prior Echocardiogram. FINDINGS  Left Ventricle: Left ventricular ejection fraction, by estimation, is 60 to 65%. The left ventricle has normal function. The left ventricle has no regional wall motion abnormalities. Right Ventricle: There is mildly elevated pulmonary artery systolic pressure. The tricuspid regurgitant velocity is 3.01 m/s, and with an assumed right atrial pressure of 0 mmHg, the estimated right ventricular systolic pressure is 0000000 mmHg. Left Atrium: Left  atrial size was normal in size. Right Atrium: Right atrial size was normal in size. Pericardium: A small pericardial effusion is present. The pericardial effusion is  circumferential. Mitral Valve: The mitral valve is degenerative in appearance. Mild to moderate mitral annular calcification. Mild mitral valve regurgitation. No evidence of mitral valve stenosis. Tricuspid Valve: The tricuspid valve is grossly normal. Tricuspid valve regurgitation is trivial. No evidence of tricuspid stenosis. Aortic Valve: The aortic valve is tricuspid and the leaflets are moderately calcified. There is trivial aortic regurgitation. There is no apparent vegetation on the current study. It appears to just be calcium on the valve. Additionally, there is no significant regurgitation, which goes against a destructive lesion such as endocarditis. If there are clinical concerns for endocarditis, would recommend a TEE. The aortic valve is tricuspid. There is moderate calcification of the aortic valve. There is moderate thickening of the aortic valve. Aortic valve regurgitation is trivial. Mild to moderate aortic valve sclerosis/calcification is present, without any evidence of aortic stenosis. Pulmonic Valve: The pulmonic valve was grossly normal. Pulmonic valve regurgitation is trivial. No evidence of pulmonic stenosis. Aorta: The aortic root and ascending aorta are structurally normal, with no evidence of dilitation. LEFT VENTRICLE PLAX 2D LVIDd:         4.90 cm LVIDs:         3.20 cm LVOT diam:     2.20 cm LV SV:         69 LV SV Index:   33 LVOT Area:     3.80 cm  AORTIC VALVE LVOT Vmax:   88.80 cm/s LVOT Vmean:  55.100 cm/s LVOT VTI:    0.182 m  AORTA Ao Root diam: 2.80 cm Ao Asc diam:  3.20 cm MITRAL VALVE               TRICUSPID VALVE MV Area (PHT): 3.17 cm    TR Peak grad:   36.2 mmHg MV Decel Time: 239 msec    TR Vmax:        301.00 cm/s MV E velocity: 80.60 cm/s MV A velocity: 77.60 cm/s  SHUNTS MV E/A ratio:  1.04        Systemic VTI:  0.18 m                            Systemic Diam: 2.20 cm Eleonore Chiquito MD Electronically signed by Eleonore Chiquito MD Signature Date/Time: 06/19/2020/4:08:02 PM     Final     Cardiac Studies   Cardiac Cath 06/20/2020 Conclusion  Conclusions: 1. Severe single-vessel coronary artery disease with 95% ostial/proximal LAD stenosis with heavy calcification.  Mild to moderate CAD also noted in mid/distal LAD, D1, ramus, intermedius, large OM1, codominant LCx, and RCA. 2. Normal left ventricular filling pressure.  Recommendations: 1. Images reviewed with interventional team and Dr. Radford Pax.  Given multiple comorbitities, including recurrent CVA and ESRD, patient is not a good candidate for CABG.  Additionally, she has refused procedures necessitating general anesthesia.  We agree that the most favorable plan would be to proceed with atherectomy/PCI of the ostial/proximal LAD. 2. Stop ticagrelor after tonight's dose and load with clopidogrel 300 mg x 1 tomorrow morning, followed by 75 mg daily thereafter.  Anticipate discharge on apixaban and clopidogrel. 3. Restart IV heparin in the setting of paroxysmal atrial fibrillation and NSTEMI 8 hours after right femoral artery sheath removed. 4. Remove right femoral artery sheath with manual compression. 5. Aggressive secondary prevention.  Diagnostic Dominance: Co-dominant     06/19/2020 IMPRESSION: 1. There is an infarct in the inferior wall extending from mid wall to apex. There is a moderate size reversible defect in the anterior septal wall towards the apex.  2. Global hypokinesis. No wall thickening in the region of infarct. Severely abnormal end-diastolic volume and end systolic volume.  3. Left ventricular ejection fraction 44%  4. Non invasive risk stratification*: High  ECHO 06/19/2020 IMPRESSIONS   1. The aortic valve is tricuspid and the leaflets are moderately  calcified. There is trivial aortic regurgitation. There is no apparent  vegetation on the current study. It appears to just be calcium on the  valve. Additionally, there is no significant  regurgitation, which goes against a  destructive lesion such as  endocarditis. If there are clinical concerns for endocarditis, would  recommend a TEE. The aortic valve is tricuspid. There is moderate  calcification of the aortic valve. There is moderate  thickening of the aortic valve. Aortic valve regurgitation is trivial.  Mild to moderate aortic valve sclerosis/calcification is present, without  any evidence of aortic stenosis.  2. Left ventricular ejection fraction, by estimation, is 60 to 65%. The  left ventricle has normal function. The left ventricle has no regional  wall motion abnormalities.  3. A small pericardial effusion is present. The pericardial effusion is  circumferential.  4. The mitral valve is degenerative. Mild mitral valve regurgitation. No  evidence of mitral stenosis.  5. There is mildly elevated pulmonary artery systolic pressure.   Comparison(s): No prior Echocardiogram.  Echo 04/02/20: 1. When compared to the prior study from 03/10/2018 aortc valve is now  thicker with a possible mobile echodensity attached to the left coronary  cusp. A TEE is recommended for further evaluation.  2. Left ventricular ejection fraction, by estimation, is 65 to 70%. The  left ventricle has normal function. The left ventricle has no regional  wall motion abnormalities. Left ventricular diastolic parameters are  consistent with Grade II diastolic  dysfunction (pseudonormalization). Elevated left atrial pressure.  3. Right ventricular systolic function is normal. The right ventricular  size is normal. There is mildly elevated pulmonary artery systolic  pressure.  4. Left atrial size was mildly dilated.  5. The mitral valve is normal in structure. No evidence of mitral valve  regurgitation. No evidence of mitral stenosis.  6. The aortic valve is normal in structure. There is mild calcification  of the aortic valve. There is severe thickening of the aortic valve.  Aortic valve regurgitation is mild. No  aortic stenosis is present.  7. The inferior vena cava is normal in size with greater than 50%  respiratory variability, suggesting right atrial pressure of 3 mmHg.   Patient Profile     55 y.o. female with a history of ESRD on HD, poorly controlled HTN, noncompliance, morbid obesity, chronic diastolic heart failure, and CVA (2019, 2021) who presented with chest pain found to be in SVT with elevated troponin for which cardiology has been consulted.  Assessment & Plan    #Paroxysmal Atrial fibrillation with RVR -Patient presented with chest pain and rapid afib with RVR -remains in sinus brady to NSR on tele -Continue coreg '25mg'$  BID -Continue heparin gtt for [facility] for now for pending PCI tomorrow -transition to apixaban prior to discharge  #Concern for aortic valve vegetation: Patient noted to have thicker aortic valve with possible mobile echodensity on the [place] on surface TTE in 03/2020. TEE deferred at that time due to  COVID and patient was recommended to follow-up, but she has not.  -patient adamently refused general sedation at this time as had bad experience with it in the past where she "would not wake up" and she "almost died." She is amenable to proceed with moderate sedation, however.  -Limited TTE done yesterday showed typical AVSC but no focal density that was worrisome for vegetation -I have personally reviewed the TTE from 06/19/20 and agree this is consistent with AVSC and likely more calcified AV in the setting of ESRD on HD>>No vegetation noted -she has no current infectious symptoms and remains afebrile -she has a source for prior CVAs with known PAF and TEE in 2019 for CVA with bubble study showed no PFO  #Chest pain/ASCAD: Developed in the setting of Afib with RVR. Resolved with conversion to NSR. HS troponin 174 --> 233 --> 367. ECG with TWI that were not present on priors. Suspect demand, however, patient has multiple risk factors for CAD.  Leane Call was high risk  showing infarct in the inferior wall extending from mid wall to apex with moderate size reversible defect in the anterior septal wall towards the apex. -Cath yesterday showed severe 95% ostial/proximal LAD stenosis with heavy calcification and mild to moderate CAD in mid to distal LAD/D1/Ramus/Large OM1, RCA and codominant LCx.   -given her co morbidities not a good CABG candidate so plan for PCI of LAD tomorrow -Continue heparin gtt for [facility], ASA, coreg '25mg'$  BID, lipitor '80mg'$  daily -patient had been taking Brilinta for CVA and this has been changed to Plavix due to need post PCI for DOAC for her PAF  #Hypertension -Well controlled and on the soft side at 105/41mHg -Continue  Amlodipine '10mg'$  daily, coreg '25mg'$  BID, clonidine 0.'1mg'$  daily  #Chronic diastolic heart failure -Fluid management with HD -Continue antihypertensive agents as detailed above  #Hx of CVA (2019, 2021) -Patient with history of recurrent strokes likely related to PAF.   -TEE in 2019 for CVA showed no PFO by bubble study -Currently on ASA and ticagrelor changed to Clopidigrel due to need for DOAC on discharge  #Anemia of chronic disease #ESRD on HD - TTS - per primary  I have spent a total of 35 minutes with patient reviewing 2D echo from 03/2020 and 06/2020 , telemetry, EKGs, labs and examining patient as well as establishing an assessment and plan that was discussed with the patient.  > 50% of time was spent in direct patient care including risks of cath.  For questions or updates, please contact [place]Please consult [email or web address] for contact info under      Signed, TFransico Him MD  06/21/2020, 9:50 AM

## 2020-06-21 NOTE — TOC Benefit Eligibility Note (Signed)
Patient Teacher, English as a foreign language completed.    The patient is currently admitted and upon discharge could be taking Eliquis 5 mg.  The current 30 day co-pay is, $0.00.   The patient is insured through [employer] dual Medicaid and Medicare Part D    Lyndel Safe, [place] Patient Advocate Specialist Independence Team Direct Number: [phone]  Fax: [phone]

## 2020-06-21 NOTE — H&P (View-Only) (Signed)
Progress Note  Patient Name: Erin Dean Date of Encounter: 06/21/2020  [facility] Cardiologist: Skeet Latch, MD   Subjective   Currently in HD  Cath yesterday showed severe 95% ostial/proximal LAD stenosis with heavy calcification and mild to moderate CAD in mid to distal LAD/D1/Ramus/Large OM1, RCA and codominant LCx.    Currently with no CP or SOB.   Inpatient Medications    Scheduled Meds: . amLODipine  10 mg Oral Daily  . atorvastatin  80 mg Oral q1800  . calcitRIOL  0.25 mcg Oral Daily  . carvedilol  25 mg Oral BID WC  . cinacalcet  30 mg Oral Q breakfast  . cloNIDine  0.1 mg Oral Daily  . [START ON 06/22/2020] clopidogrel  75 mg Oral QAC breakfast  . escitalopram  10 mg Oral Daily  . heparin  3,000 Units Dialysis Once in dialysis  . pantoprazole  40 mg Oral Daily  . sevelamer carbonate  3,200 mg Oral TID with meals  . sodium chloride flush  3 mL Intravenous Q12H  . ticagrelor  90 mg Oral BID   Continuous Infusions: . sodium chloride    . sodium chloride    . sodium chloride    . sodium chloride    . sodium chloride    . heparin 1,050 Units/hr (06/20/20 2201)   PRN Meds: sodium chloride, sodium chloride, sodium chloride, sodium chloride, sodium chloride, acetaminophen **OR** acetaminophen, ALPRAZolam, alteplase, heparin, lidocaine (PF), lidocaine-prilocaine, ondansetron **OR** ondansetron (ZOFRAN) IV, pentafluoroprop-tetrafluoroeth, sodium chloride flush   Vital Signs    Vitals:   06/21/20 0751 06/21/20 0840 06/21/20 0845 06/21/20 0850  BP: 114/61  (!) 98/57 105/62  Pulse: (!) 53  (!) 57 (!) 56  Resp: 15  17 (!) 22  Temp: 97.9 F (36.6 C)  98.5 F (36.9 C)   TempSrc: Oral  Oral   SpO2: 93%     Weight:  110 kg    Height:        Intake/Output Summary (Last 24 hours) at 06/21/2020 0950 Last data filed at 06/21/2020 0800 Gross per 24 hour  Intake 473.24 ml  Output --  Net 473.24 ml   Last 3 Weights 06/21/2020 06/17/2020 04/02/2020   Weight (lbs) 242 lb 8.1 oz 218 lb 251 lb 1.7 oz  Weight (kg) 110 kg 98.884 kg 113.9 kg      Telemetry    NSR with PVCs - Personally Reviewed  ECG    No new tracing - Personally Reviewed  Physical Exam   GEN: Well nourished, well developed in no acute distress HEENT: Normal NECK: No JVD; No carotid bruits LYMPHATICS: No lymphadenopathy CARDIAC:RRR, no murmurs, rubs, gallops RESPIRATORY:  Clear to auscultation without rales, wheezing or rhonchi  ABDOMEN: Soft, non-tender, non-distended MUSCULOSKELETAL:  No edema; No deformity  SKIN: Warm and dry NEUROLOGIC:  Alert and oriented x 3 PSYCHIATRIC:  Normal affect     Labs    High Sensitivity Troponin:   Recent Labs  Lab 06/17/20 2020 06/17/20 2225 06/18/20 0338  TROPONINIHS 174* 233* 367*      Chemistry Recent Labs  Lab 06/17/20 2020 06/18/20 0338 06/20/20 0027 06/20/20 1106 06/21/20 0554  NA 140   < > 138 140 139  K 4.4   < > 4.4 4.4 5.0  CL 97*   < > 97* 100 99  CO2 27   < > '27 26 26  '$ GLUCOSE 111*   < > 94 98 85  BUN 33*   < >  24* 26* 35*  CREATININE 8.61*   < > 7.47* 8.50* 10.00*  CALCIUM 8.8*   < > 8.7* 8.4* 8.6*  PROT 8.1  --   --   --   --   ALBUMIN 3.5  --   --  3.1*  --   AST 17  --   --   --   --   ALT 11  --   --   --   --   ALKPHOS 66  --   --   --   --   BILITOT 0.2*  --   --   --   --   GFRNONAA 5*   < > 6* 5* 4*  ANIONGAP 16*   < > '14 14 14   '$ < > = values in this interval not displayed.     Hematology Recent Labs  Lab 06/20/20 0027 06/20/20 1106 06/21/20 0554  WBC 12.2* 9.5 10.0  RBC 2.85* 2.77* 2.86*  HGB 8.7* 8.4* 8.6*  HCT 28.0* 27.5* 28.5*  MCV 98.2 99.3 99.7  MCH 30.5 30.3 30.1  MCHC 31.1 30.5 30.2  RDW 15.3 15.4 15.6*  PLT 265 250 271    BNPNo results for input(s): BNP, PROBNP in the last 168 hours.   DDimer No results for input(s): DDIMER in the last 168 hours.   Radiology    CARDIAC CATHETERIZATION  Result Date: 06/20/2020 Conclusions: 1. Severe single-vessel  coronary artery disease with 95% ostial/proximal LAD stenosis with heavy calcification.  Mild to moderate CAD also noted in mid/distal LAD, D1, ramus, intermedius, large OM1, codominant LCx, and RCA. 2. Normal left ventricular filling pressure. Recommendations: 1. Images reviewed with interventional team and Dr. Radford Pax.  Given multiple comorbitities, including recurrent CVA and ESRD, patient is not a good candidate for CABG.  Additionally, she has refused procedures necessitating general anesthesia.  We agree that the most favorable plan would be to proceed with atherectomy/PCI of the ostial/proximal LAD. 2. Stop ticagrelor after tonight's dose and load with clopidogrel 300 mg x 1 tomorrow morning, followed by 75 mg daily thereafter.  Anticipate discharge on apixaban and clopidogrel. 3. Restart IV heparin in the setting of paroxysmal atrial fibrillation and NSTEMI 8 hours after right femoral artery sheath removed. 4. Remove right femoral artery sheath with manual compression. 5. Aggressive secondary prevention. Nelva Bush, MD [facility]   NM Myocar Multi W/Spect Tamela Oddi Motion / EF  Result Date: 06/19/2020 CLINICAL DATA:  Chest pain EXAM: MYOCARDIAL IMAGING WITH SPECT (REST AND PHARMACOLOGIC-STRESS) GATED LEFT VENTRICULAR WALL MOTION STUDY LEFT VENTRICULAR EJECTION FRACTION TECHNIQUE: Standard myocardial SPECT imaging was performed after resting intravenous injection of 11 mCi Tc-13mtetrofosmin. Subsequently, intravenous infusion of Lexiscan was performed under the supervision of the Cardiology staff. At peak effect of the drug, 33 mCi Tc-952metrofosmin was injected intravenously and standard myocardial SPECT imaging was performed. Quantitative gated imaging was also performed to evaluate left ventricular wall motion, and estimate left ventricular ejection fraction. COMPARISON:  None. FINDINGS: Perfusion: There is a fixed defect in the inferior wall extending from mid wall the apex. There is a mildly  reversible defect accounting for approximately 10% of left ventricular wall affecting the anterior septal wall. No other perfusion abnormalities. Wall Motion: Global hypokinesis. No wall thickening in the region of the inferior wall fixed defect. Left Ventricular Ejection Fraction: 44 % End diastolic volume [id number] End systolic volume 91 ml IMPRESSION: 1. There is an infarct in the inferior wall extending from mid wall to  apex. There is a moderate size reversible defect in the anterior septal wall towards the apex. 2. Global hypokinesis. No wall thickening in the region of infarct. Severely abnormal end-diastolic volume and end systolic volume. 3. Left ventricular ejection fraction 44% 4. Non invasive risk stratification*: High *2012 Appropriate Use Criteria for Coronary Revascularization Focused Update: J Am Coll Cardiol. [id number]. http://content.airportbarriers.com.aspx?articleid=1201161 These results will be called to the ordering clinician or representative by the Radiologist Assistant, and communication documented in the PACS or [employer]. Electronically Signed   By: Dorise Bullion III M.D   On: 06/19/2020 14:15   ECHOCARDIOGRAM LIMITED  Result Date: 06/19/2020    ECHOCARDIOGRAM LIMITED REPORT   Patient Name:   Erin Dean Date of Exam: 06/19/2020 Medical Rec #:  [id number]        Height:       68.0 in Accession #:    [id number]       Weight:       218.0 lb Date of Birth:  1965/07/23        BSA:          2.120 m Patient Age:    55 years         BP:           185/105 mmHg Patient Gender: F                HR:           64 bpm. Exam Location:  Inpatient Procedure: Limited Echo and Color Doppler Indications:    possible aortic valve vegetation  History:        Patient has prior history of Echocardiogram examinations, most                 recent 04/02/2020. End stage renal disease, Signs/Symptoms:Chest                 Pain; Risk Factors:Hypertension.  Sonographer:    Johny Chess  Referring Phys: [id number] [facility]  Sonographer Comments: Patient is morbidly obese. Image acquisition challenging due to patient body habitus. IMPRESSIONS  1. The aortic valve is tricuspid and the leaflets are moderately calcified. There is trivial aortic regurgitation. There is no apparent vegetation on the current study. It appears to just be calcium on the valve. Additionally, there is no significant regurgitation, which goes against a destructive lesion such as endocarditis. If there are clinical concerns for endocarditis, would recommend a TEE. The aortic valve is tricuspid. There is moderate calcification of the aortic valve. There is moderate thickening of the aortic valve. Aortic valve regurgitation is trivial. Mild to moderate aortic valve sclerosis/calcification is present, without any evidence of aortic stenosis.  2. Left ventricular ejection fraction, by estimation, is 60 to 65%. The left ventricle has normal function. The left ventricle has no regional wall motion abnormalities.  3. A small pericardial effusion is present. The pericardial effusion is circumferential.  4. The mitral valve is degenerative. Mild mitral valve regurgitation. No evidence of mitral stenosis.  5. There is mildly elevated pulmonary artery systolic pressure. Comparison(s): No prior Echocardiogram. FINDINGS  Left Ventricle: Left ventricular ejection fraction, by estimation, is 60 to 65%. The left ventricle has normal function. The left ventricle has no regional wall motion abnormalities. Right Ventricle: There is mildly elevated pulmonary artery systolic pressure. The tricuspid regurgitant velocity is 3.01 m/s, and with an assumed right atrial pressure of 0 mmHg, the estimated right ventricular systolic pressure is 0000000 mmHg. Left Atrium: Left  atrial size was normal in size. Right Atrium: Right atrial size was normal in size. Pericardium: A small pericardial effusion is present. The pericardial effusion is  circumferential. Mitral Valve: The mitral valve is degenerative in appearance. Mild to moderate mitral annular calcification. Mild mitral valve regurgitation. No evidence of mitral valve stenosis. Tricuspid Valve: The tricuspid valve is grossly normal. Tricuspid valve regurgitation is trivial. No evidence of tricuspid stenosis. Aortic Valve: The aortic valve is tricuspid and the leaflets are moderately calcified. There is trivial aortic regurgitation. There is no apparent vegetation on the current study. It appears to just be calcium on the valve. Additionally, there is no significant regurgitation, which goes against a destructive lesion such as endocarditis. If there are clinical concerns for endocarditis, would recommend a TEE. The aortic valve is tricuspid. There is moderate calcification of the aortic valve. There is moderate thickening of the aortic valve. Aortic valve regurgitation is trivial. Mild to moderate aortic valve sclerosis/calcification is present, without any evidence of aortic stenosis. Pulmonic Valve: The pulmonic valve was grossly normal. Pulmonic valve regurgitation is trivial. No evidence of pulmonic stenosis. Aorta: The aortic root and ascending aorta are structurally normal, with no evidence of dilitation. LEFT VENTRICLE PLAX 2D LVIDd:         4.90 cm LVIDs:         3.20 cm LVOT diam:     2.20 cm LV SV:         69 LV SV Index:   33 LVOT Area:     3.80 cm  AORTIC VALVE LVOT Vmax:   88.80 cm/s LVOT Vmean:  55.100 cm/s LVOT VTI:    0.182 m  AORTA Ao Root diam: 2.80 cm Ao Asc diam:  3.20 cm MITRAL VALVE               TRICUSPID VALVE MV Area (PHT): 3.17 cm    TR Peak grad:   36.2 mmHg MV Decel Time: 239 msec    TR Vmax:        301.00 cm/s MV E velocity: 80.60 cm/s MV A velocity: 77.60 cm/s  SHUNTS MV E/A ratio:  1.04        Systemic VTI:  0.18 m                            Systemic Diam: 2.20 cm Eleonore Chiquito MD Electronically signed by Eleonore Chiquito MD Signature Date/Time: 06/19/2020/4:08:02 PM     Final     Cardiac Studies   Cardiac Cath 06/20/2020 Conclusion  Conclusions: 1. Severe single-vessel coronary artery disease with 95% ostial/proximal LAD stenosis with heavy calcification.  Mild to moderate CAD also noted in mid/distal LAD, D1, ramus, intermedius, large OM1, codominant LCx, and RCA. 2. Normal left ventricular filling pressure.  Recommendations: 1. Images reviewed with interventional team and Dr. Radford Pax.  Given multiple comorbitities, including recurrent CVA and ESRD, patient is not a good candidate for CABG.  Additionally, she has refused procedures necessitating general anesthesia.  We agree that the most favorable plan would be to proceed with atherectomy/PCI of the ostial/proximal LAD. 2. Stop ticagrelor after tonight's dose and load with clopidogrel 300 mg x 1 tomorrow morning, followed by 75 mg daily thereafter.  Anticipate discharge on apixaban and clopidogrel. 3. Restart IV heparin in the setting of paroxysmal atrial fibrillation and NSTEMI 8 hours after right femoral artery sheath removed. 4. Remove right femoral artery sheath with manual compression. 5. Aggressive secondary prevention.  Diagnostic Dominance: Co-dominant     06/19/2020 IMPRESSION: 1. There is an infarct in the inferior wall extending from mid wall to apex. There is a moderate size reversible defect in the anterior septal wall towards the apex.  2. Global hypokinesis. No wall thickening in the region of infarct. Severely abnormal end-diastolic volume and end systolic volume.  3. Left ventricular ejection fraction 44%  4. Non invasive risk stratification*: High  ECHO 06/19/2020 IMPRESSIONS   1. The aortic valve is tricuspid and the leaflets are moderately  calcified. There is trivial aortic regurgitation. There is no apparent  vegetation on the current study. It appears to just be calcium on the  valve. Additionally, there is no significant  regurgitation, which goes against a  destructive lesion such as  endocarditis. If there are clinical concerns for endocarditis, would  recommend a TEE. The aortic valve is tricuspid. There is moderate  calcification of the aortic valve. There is moderate  thickening of the aortic valve. Aortic valve regurgitation is trivial.  Mild to moderate aortic valve sclerosis/calcification is present, without  any evidence of aortic stenosis.  2. Left ventricular ejection fraction, by estimation, is 60 to 65%. The  left ventricle has normal function. The left ventricle has no regional  wall motion abnormalities.  3. A small pericardial effusion is present. The pericardial effusion is  circumferential.  4. The mitral valve is degenerative. Mild mitral valve regurgitation. No  evidence of mitral stenosis.  5. There is mildly elevated pulmonary artery systolic pressure.   Comparison(s): No prior Echocardiogram.  Echo 04/02/20: 1. When compared to the prior study from 03/10/2018 aortc valve is now  thicker with a possible mobile echodensity attached to the left coronary  cusp. A TEE is recommended for further evaluation.  2. Left ventricular ejection fraction, by estimation, is 65 to 70%. The  left ventricle has normal function. The left ventricle has no regional  wall motion abnormalities. Left ventricular diastolic parameters are  consistent with Grade II diastolic  dysfunction (pseudonormalization). Elevated left atrial pressure.  3. Right ventricular systolic function is normal. The right ventricular  size is normal. There is mildly elevated pulmonary artery systolic  pressure.  4. Left atrial size was mildly dilated.  5. The mitral valve is normal in structure. No evidence of mitral valve  regurgitation. No evidence of mitral stenosis.  6. The aortic valve is normal in structure. There is mild calcification  of the aortic valve. There is severe thickening of the aortic valve.  Aortic valve regurgitation is mild. No  aortic stenosis is present.  7. The inferior vena cava is normal in size with greater than 50%  respiratory variability, suggesting right atrial pressure of 3 mmHg.   Patient Profile     55 y.o. female with a history of ESRD on HD, poorly controlled HTN, noncompliance, morbid obesity, chronic diastolic heart failure, and CVA (2019, 2021) who presented with chest pain found to be in SVT with elevated troponin for which cardiology has been consulted.  Assessment & Plan    #Paroxysmal Atrial fibrillation with RVR -Patient presented with chest pain and rapid afib with RVR -remains in sinus brady to NSR on tele -Continue coreg '25mg'$  BID -Continue heparin gtt for [facility] for now for pending PCI tomorrow -transition to apixaban prior to discharge  #Concern for aortic valve vegetation: Patient noted to have thicker aortic valve with possible mobile echodensity on the [place] on surface TTE in 03/2020. TEE deferred at that time due to  COVID and patient was recommended to follow-up, but she has not.  -patient adamently refused general sedation at this time as had bad experience with it in the past where she "would not wake up" and she "almost died." She is amenable to proceed with moderate sedation, however.  -Limited TTE done yesterday showed typical AVSC but no focal density that was worrisome for vegetation -I have personally reviewed the TTE from 06/19/20 and agree this is consistent with AVSC and likely more calcified AV in the setting of ESRD on HD>>No vegetation noted -she has no current infectious symptoms and remains afebrile -she has a source for prior CVAs with known PAF and TEE in 2019 for CVA with bubble study showed no PFO  #Chest pain/ASCAD: Developed in the setting of Afib with RVR. Resolved with conversion to NSR. HS troponin 174 --> 233 --> 367. ECG with TWI that were not present on priors. Suspect demand, however, patient has multiple risk factors for CAD.  Leane Call was high risk  showing infarct in the inferior wall extending from mid wall to apex with moderate size reversible defect in the anterior septal wall towards the apex. -Cath yesterday showed severe 95% ostial/proximal LAD stenosis with heavy calcification and mild to moderate CAD in mid to distal LAD/D1/Ramus/Large OM1, RCA and codominant LCx.   -given her co morbidities not a good CABG candidate so plan for PCI of LAD tomorrow -Continue heparin gtt for [facility], ASA, coreg '25mg'$  BID, lipitor '80mg'$  daily -patient had been taking Brilinta for CVA and this has been changed to Plavix due to need post PCI for DOAC for her PAF  #Hypertension -Well controlled and on the soft side at 105/93mHg -Continue  Amlodipine '10mg'$  daily, coreg '25mg'$  BID, clonidine 0.'1mg'$  daily  #Chronic diastolic heart failure -Fluid management with HD -Continue antihypertensive agents as detailed above  #Hx of CVA (2019, 2021) -Patient with history of recurrent strokes likely related to PAF.   -TEE in 2019 for CVA showed no PFO by bubble study -Currently on ASA and ticagrelor changed to Clopidigrel due to need for DOAC on discharge  #Anemia of chronic disease #ESRD on HD - TTS - per primary  I have spent a total of 35 minutes with patient reviewing 2D echo from 03/2020 and 06/2020 , telemetry, EKGs, labs and examining patient as well as establishing an assessment and plan that was discussed with the patient.  > 50% of time was spent in direct patient care including risks of cath.  For questions or updates, please contact CNancePlease consult [email or web address] for contact info under      Signed, TFransico Him MD  06/21/2020, 9:50 AM

## 2020-06-21 NOTE — Progress Notes (Signed)
[place] for heparin Indication: atrial fibrillation  Allergies  Allergen Reactions  . Chlorhexidine Gluconate Rash    Patient Measurements: Height: '5\' 8"'$  (172.7 cm) Weight: 98.9 kg (218 lb) IBW/kg (Calculated) : 63.9 Heparin Dosing Weight: 85kg  Vital Signs: Temp: 98.1 F (36.7 C) (03/15 0719) Temp Source: Oral (03/15 0719) BP: 126/58 (03/15 0300) Pulse Rate: 54 (03/15 0300)  Labs: Recent Labs    06/19/20 1957 06/20/20 0027 06/20/20 1106 06/21/20 0554  HGB  --  8.7* 8.4* 8.6*  HCT  --  28.0* 27.5* 28.5*  PLT  --  265 250 271  HEPARINUNFRC 0.47 0.54  --  0.72*  CREATININE  --  7.47* 8.50* 10.00*    Estimated Creatinine Clearance: 7.8 mL/min (A) (by C-G formula based on SCr of 10 mg/dL (H)).   Medical History: Past Medical History:  Diagnosis Date  . ESRD (end stage renal disease) ([place])    [facility]   . Hypertension   . Stroke ([place]) 04/2017   no residual . Limp left side  . SVT (supraventricular tachycardia) ([facility])     Assessment: 46 yoF admitted with AFib RVR. No AC PTA. Pharmacy to begin heparin infusion.  Pt s/p cath, heparin restarted overnight. CBC stable, no bleeding noted. Heparin level just above goal at 0.72 on 1050 units/hr.   Goal of Therapy:  Heparin level 0.3-0.7 units/ml Monitor platelets by anticoagulation protocol: Yes   Plan:  Decrease heparin to 950 units/h Daily heparin level and CBC  Erin Hearing PharmD., BCPS Clinical Pharmacist 06/21/2020 7:51 AM

## 2020-06-21 NOTE — Progress Notes (Signed)
Transported to hemodialysis dept. by  bed awake and alert. stable

## 2020-06-21 NOTE — Progress Notes (Signed)
PROGRESS NOTE    Erin Dean  [id number] DOB: Jun 28, 1965 DOA: 06/17/2020 PCP: Lois Huxley, PA   Brief Narrative:  This 55 years old female with PMH significant for ESRD on hemodialysis, HTN, history of multiple CVAs, depression and anxiety presenting to the ED for the evaluation of chest pain.  Patient reports intermittent episodes of chest pain associated with palpitations over  last month , she had similar episode yesterday but was more intense than the previous one.  She took 2 nitro without any relief and called EMS.  She was found to be tachycardic with heart rate in 180s with rhythm strip indicating atrial fibrillation.  She was given aspirin and third nitro and was brought in the ED.  EKG shows normal sinus rhythm in the ED.  She reports being scheduled to have outpatient TEE because on recent TTE,  she was found to have a mobile echodensity on aortic valve. Troponins trending up.  Cardiology was consulted, Patient underwent Lexiscan Myoview which was positive.  Initial plan was TEE to rule out aortic valve vegetation.  Patient has concerns about general anesthesia with TEE. She reports very poor response with general anesthesia in the past where she would not wake up.  Cardiology thinks there is no indication for TEE at this point.  Patient underwent left heart cath found to have severe proximal LAD stenosis with heavy calcification.  Patient is not a good CABG candidate due to multiple comorbidities,  Cardiology is planning to do PCI to LAD 3/16.    Assessment & Plan:   Principal Problem:   Atypical chest pain Active Problems:   Anxiety   Essential hypertension   Cryptogenic stroke (HCC)   ESRD (end stage renal disease) (HCC)   Arrhythmia, atrial   Elevated troponin   PAF (paroxysmal atrial fibrillation) (HCC)   Chest pain   Chronic diastolic CHF (congestive heart failure) (HCC)   Non-ST elevation (NSTEMI) myocardial infarction (HCC)   Abnormal stress test   Chest pain  with elevated troponins; transient arrhythmia  - Patient presents after an episode of chest pain associated with transient tachyarrhythmia and is found to have elevated troponin  - Narrow complex irregular tachyarrhythmia with rate 186 captured by EMS and returned to [place] spontaneously prior to arrival in ED  - She reports similar episodes of rapid palpitations,  multiple times over the past month but this was more intense  - Continue cardiac monitoring, TSH normal,  troponin trending up. - Cardiology consulted, patient underwent Lexiscan Myoview which was positive for ischemia. - Initial plan was TEE to rule out aortic valve vegetation. - Repeat TTE showed typical aortic valve sclerosis but no focal density that is worrisome for vegetation. - At this time cardiology think there is no indication for TEE. - Patient has refused general sedation at this time as she had bad experience in the past where she would not wake up.   - She is agreeable to moderate sedation. - Patient underwent left heart catheterization on 3/14 , found to have severe proximal LAD stenosis with heavy calcification. - Patient is not good CABG candidate due to multiple comorbidities.. - Patient is scheduled to have PCI to LAD on 3/16.  Paroxysmal atrial fibrillation: -EKG showed paroxysmal atrial fibrillation with rapid ventricular rate which subsequently converted to sinus rhythm. -Heart rate is controlled now , Continue heparin IV.  Plan is to transition to Eliquis before discharge.   ESRD: - Continue fluid-restriction, Lasix, phosphate binder, renal-dosing of medications  -  Nephro consulted for continuation of hemodialysis.   - Hemodialysis as per schedule.   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  - Needs to reschedule since she missed the appointment. - Continue  aspirin, statin and Brilinta. - Cardiology states there is no indication for TEE at this time.  - Plan is to continue aspirin and switch Brilinta to Plavix.  Hypertension  - Continue Norvasc, hydralazine, clonidine, Coreg, Lasix    Depression; anxiety  - Continue Lexapro and as-needed Xanax.   DVT prophylaxis: Heparin gtt. Code Status: Full code Family Communication: Family at bedside  Disposition Plan:  Status is: Inpatient  Remains inpatient appropriate because:Inpatient level of care appropriate due to severity of illness   Dispo: The patient is from: Home              Anticipated d/c is to: Home              Patient currently is not medically stable to d/c.   Difficult to place patient No   Consultants:   Cardiology  Procedures: Echocardiogram Antimicrobials:   Anti-infectives (From admission, onward)   None      Subjective: Patient was seen and examined at bedside.  Overnight events noted.  Patient reports feeling better, denies any chest pain or shortness of breath. Patient has refused about having any procedure which needs general anesthesia. She states she is willing to have procedure under light sedation but refuses general anesthesia. She is scheduled to have PCI to LAD on 3/16.  Objective: Vitals:   06/21/20 1130 06/21/20 1200 06/21/20 1229 06/21/20 1310  BP: (!) 99/59 (!) 82/55 (!) 101/49   Pulse: (!) 50 (!) 49 (!) 51 60  Resp: 20 (!) 22 (!) 22   Temp:   98.8 F (37.1 C)   TempSrc:      SpO2:    93%  Weight:      Height:        Intake/Output Summary (Last 24 hours) at 06/21/2020 1335 Last data filed at 06/21/2020 1300 Gross per 24 hour  Intake 421.24 ml  Output -  Net 421.24 ml   Filed Weights   06/17/20 1937 06/21/20 0840  Weight: 98.9 kg 110 kg    Examination:  General exam: Appears calm and comfortable, not in any acute distress. Respiratory system: Clear to auscultation. Respiratory effort normal. Cardiovascular system: S1 &  S2 heard, RRR. No JVD, murmurs, rubs, gallops or clicks. No pedal edema. Gastrointestinal system: Abdomen is nondistended, soft and nontender. No organomegaly or masses felt.  Normal bowel sounds heard. Central nervous system: Alert and oriented. No focal neurological deficits. Extremities: Symmetric 5 x 5 power.  No edema, no cyanosis, no clubbing. Skin: No rashes, lesions or ulcers Psychiatry: Judgement and insight appear normal. Mood & affect appropriate.     Data Reviewed: I have personally reviewed following labs and imaging studies  CBC: Recent Labs  Lab 06/17/20 2020 06/18/20 0338 06/19/20 0741 06/20/20 0027 06/20/20 1106 06/21/20 0554  WBC 10.1 11.0* 13.1* 12.2* 9.5 10.0  NEUTROABS 7.1  --   --   --   --   --   HGB 9.5* 8.6* 9.2* 8.7* 8.4* 8.6*  HCT 29.5* 27.6* 29.7* 28.0* 27.5* 28.5*  MCV 97.4 98.9 97.7 98.2 99.3 99.7  PLT 199 186 228 265 250 271   Basic  Metabolic Panel: Recent Labs  Lab 06/17/20 2020 06/18/20 0338 06/20/20 0027 06/20/20 1106 06/21/20 0554  NA 140 139 138 140 139  K 4.4 4.9 4.4 4.4 5.0  CL 97* 96* 97* 100 99  CO2 '27 29 27 26 26  '$ GLUCOSE 111* 101* 94 98 85  BUN 33* 41* 24* 26* 35*  CREATININE 8.61* 9.69* 7.47* 8.50* 10.00*  CALCIUM 8.8* 8.7* 8.7* 8.4* 8.6*  MG  --   --   --   --  2.2  PHOS  --   --   --  6.2* 7.1*   GFR: Estimated Creatinine Clearance: 8.3 mL/min (A) (by C-G formula based on SCr of 10 mg/dL (H)). Liver Function Tests: Recent Labs  Lab 06/17/20 2020 06/20/20 1106  AST 17  --   ALT 11  --   ALKPHOS 66  --   BILITOT 0.2*  --   PROT 8.1  --   ALBUMIN 3.5 3.1*   Recent Labs  Lab 06/17/20 2020  LIPASE 44   No results for input(s): AMMONIA in the last 168 hours. Coagulation Profile: No results for input(s): INR, PROTIME in the last 168 hours. Cardiac Enzymes: No results for input(s): CKTOTAL, CKMB, CKMBINDEX, TROPONINI in the last 168 hours. BNP (last 3 results) No results for input(s): PROBNP in the last 8760  hours. HbA1C: No results for input(s): HGBA1C in the last 72 hours. CBG: No results for input(s): GLUCAP in the last 168 hours. Lipid Profile: No results for input(s): CHOL, HDL, LDLCALC, TRIG, CHOLHDL, LDLDIRECT in the last 72 hours. Thyroid Function Tests: No results for input(s): TSH, T4TOTAL, FREET4, T3FREE, THYROIDAB in the last 72 hours. Anemia Panel: No results for input(s): VITAMINB12, FOLATE, FERRITIN, TIBC, IRON, RETICCTPCT in the last 72 hours. Sepsis Labs: No results for input(s): PROCALCITON, LATICACIDVEN in the last 168 hours.  Recent Results (from the past 240 hour(s))  MRSA PCR Screening     Status: None   Collection Time: 06/18/20  2:20 PM   Specimen: Nasopharyngeal  Result Value Ref Range Status   MRSA by PCR NEGATIVE NEGATIVE Final    Comment:        The GeneXpert MRSA Assay (FDA approved for NASAL specimens only), is one component of a comprehensive MRSA colonization surveillance program. It is not intended to diagnose MRSA infection nor to guide or monitor treatment for MRSA infections. Performed at [facility], [place] [address] [place], [place] [address]      Radiology Studies: CARDIAC CATHETERIZATION  Result Date: 06/20/2020 Conclusions: 1. Severe single-vessel coronary artery disease with 95% ostial/proximal LAD stenosis with heavy calcification.  Mild to moderate CAD also noted in mid/distal LAD, D1, ramus, intermedius, large OM1, codominant LCx, and RCA. 2. Normal left ventricular filling pressure. Recommendations: 1. Images reviewed with interventional team and Dr. Radford Pax.  Given multiple comorbitities, including recurrent CVA and ESRD, patient is not a good candidate for CABG.  Additionally, she has refused procedures necessitating general anesthesia.  We agree that the most favorable plan would be to proceed with atherectomy/PCI of the ostial/proximal LAD. 2. Stop ticagrelor after tonight's dose and load with clopidogrel 300 mg x 1 tomorrow  morning, followed by 75 mg daily thereafter.  Anticipate discharge on apixaban and clopidogrel. 3. Restart IV heparin in the setting of paroxysmal atrial fibrillation and NSTEMI 8 hours after right femoral artery sheath removed. 4. Remove right femoral artery sheath with manual compression. 5. Aggressive secondary prevention. Nelva Bush, MD [facility]   ECHOCARDIOGRAM  LIMITED  Result Date: 06/19/2020    ECHOCARDIOGRAM LIMITED REPORT   Patient Name:   TOMI DEPASS Date of Exam: 06/19/2020 Medical Rec #:  [id number]        Height:       68.0 in Accession #:    [id number]       Weight:       218.0 lb Date of Birth:  1965/09/26        BSA:          2.120 m Patient Age:    68 years         BP:           185/105 mmHg Patient Gender: F                HR:           64 bpm. Exam Location:  Inpatient Procedure: Limited Echo and Color Doppler Indications:    possible aortic valve vegetation  History:        Patient has prior history of Echocardiogram examinations, most                 recent 04/02/2020. End stage renal disease, Signs/Symptoms:Chest                 Pain; Risk Factors:Hypertension.  Sonographer:    Johny Chess Referring Phys: [id number] [facility]  Sonographer Comments: Patient is morbidly obese. Image acquisition challenging due to patient body habitus. IMPRESSIONS  1. The aortic valve is tricuspid and the leaflets are moderately calcified. There is trivial aortic regurgitation. There is no apparent vegetation on the current study. It appears to just be calcium on the valve. Additionally, there is no significant regurgitation, which goes against a destructive lesion such as endocarditis. If there are clinical concerns for endocarditis, would recommend a TEE. The aortic valve is tricuspid. There is moderate calcification of the aortic valve. There is moderate thickening of the aortic valve. Aortic valve regurgitation is trivial. Mild to moderate aortic valve sclerosis/calcification is  present, without any evidence of aortic stenosis.  2. Left ventricular ejection fraction, by estimation, is 60 to 65%. The left ventricle has normal function. The left ventricle has no regional wall motion abnormalities.  3. A small pericardial effusion is present. The pericardial effusion is circumferential.  4. The mitral valve is degenerative. Mild mitral valve regurgitation. No evidence of mitral stenosis.  5. There is mildly elevated pulmonary artery systolic pressure. Comparison(s): No prior Echocardiogram. FINDINGS  Left Ventricle: Left ventricular ejection fraction, by estimation, is 60 to 65%. The left ventricle has normal function. The left ventricle has no regional wall motion abnormalities. Right Ventricle: There is mildly elevated pulmonary artery systolic pressure. The tricuspid regurgitant velocity is 3.01 m/s, and with an assumed right atrial pressure of 0 mmHg, the estimated right ventricular systolic pressure is 0000000 mmHg. Left Atrium: Left atrial size was normal in size. Right Atrium: Right atrial size was normal in size. Pericardium: A small pericardial effusion is present. The pericardial effusion is circumferential. Mitral Valve: The mitral valve is degenerative in appearance. Mild to moderate mitral annular calcification. Mild mitral valve regurgitation. No evidence of mitral valve stenosis. Tricuspid Valve: The tricuspid valve is grossly normal. Tricuspid valve regurgitation is trivial. No evidence of tricuspid stenosis. Aortic Valve: The aortic valve is tricuspid and the leaflets are moderately calcified. There is trivial aortic regurgitation. There is no apparent vegetation on the current study. It appears to just be calcium on  the valve. Additionally, there is no significant regurgitation, which goes against a destructive lesion such as endocarditis. If there are clinical concerns for endocarditis, would recommend a TEE. The aortic valve is tricuspid. There is moderate calcification of the  aortic valve. There is moderate thickening of the aortic valve. Aortic valve regurgitation is trivial. Mild to moderate aortic valve sclerosis/calcification is present, without any evidence of aortic stenosis. Pulmonic Valve: The pulmonic valve was grossly normal. Pulmonic valve regurgitation is trivial. No evidence of pulmonic stenosis. Aorta: The aortic root and ascending aorta are structurally normal, with no evidence of dilitation. LEFT VENTRICLE PLAX 2D LVIDd:         4.90 cm LVIDs:         3.20 cm LVOT diam:     2.20 cm LV SV:         69 LV SV Index:   33 LVOT Area:     3.80 cm  AORTIC VALVE LVOT Vmax:   88.80 cm/s LVOT Vmean:  55.100 cm/s LVOT VTI:    0.182 m  AORTA Ao Root diam: 2.80 cm Ao Asc diam:  3.20 cm MITRAL VALVE               TRICUSPID VALVE MV Area (PHT): 3.17 cm    TR Peak grad:   36.2 mmHg MV Decel Time: 239 msec    TR Vmax:        301.00 cm/s MV E velocity: 80.60 cm/s MV A velocity: 77.60 cm/s  SHUNTS MV E/A ratio:  1.04        Systemic VTI:  0.18 m                            Systemic Diam: 2.20 cm Eleonore Chiquito MD Electronically signed by Eleonore Chiquito MD Signature Date/Time: 06/19/2020/4:08:02 PM    Final    Scheduled Meds: . amLODipine  10 mg Oral Daily  . atorvastatin  80 mg Oral q1800  . calcitRIOL  0.25 mcg Oral Daily  . carvedilol  25 mg Oral BID WC  . cinacalcet  30 mg Oral Q breakfast  . cloNIDine  0.1 mg Oral Daily  . [START ON 06/22/2020] clopidogrel  75 mg Oral QAC breakfast  . escitalopram  10 mg Oral Daily  . heparin  3,000 Units Dialysis Once in dialysis  . pantoprazole  40 mg Oral Daily  . sevelamer carbonate  3,200 mg Oral TID with meals  . sodium chloride flush  3 mL Intravenous Q12H   Continuous Infusions: . sodium chloride    . sodium chloride    . sodium chloride    . sodium chloride    . sodium chloride    . heparin 950 Units/hr (06/21/20 1000)     LOS: 3 days    Time spent: 25 mins    PARDEEP KUMAR, MD [facility]   If 7PM-7AM,  please contact night-coverage

## 2020-06-21 NOTE — Progress Notes (Signed)
Back from HD by bed awake and alert. °

## 2020-06-21 NOTE — Progress Notes (Signed)
Coordinated with HD RN to change rate of heparin gtt. to 9.5 ml/hr

## 2020-06-22 ENCOUNTER — Inpatient Hospital Stay (HOSPITAL_COMMUNITY)
Admission: EM | Disposition: A | Discharge status: Home / Self Care | Payer: Self-pay | Source: Home / Self Care | Attending: Family Medicine

## 2020-06-22 ENCOUNTER — Other Ambulatory Visit: Payer: Self-pay

## 2020-06-22 ENCOUNTER — Inpatient Hospital Stay (HOSPITAL_COMMUNITY): Admission: RE | Admit: 2020-06-22 | Payer: Medicare Other | Source: Home / Self Care | Admitting: Cardiovascular Disease

## 2020-06-22 DIAGNOSIS — I214 Non-ST elevation (NSTEMI) myocardial infarction: Secondary | ICD-10-CM | POA: Diagnosis not present

## 2020-06-22 DIAGNOSIS — R0789 Other chest pain: Secondary | ICD-10-CM | POA: Diagnosis not present

## 2020-06-22 DIAGNOSIS — I2510 Atherosclerotic heart disease of native coronary artery without angina pectoris: Secondary | ICD-10-CM | POA: Diagnosis not present

## 2020-06-22 HISTORY — PX: CORONARY STENT INTERVENTION: CATH118234

## 2020-06-22 HISTORY — PX: CORONARY ATHERECTOMY: CATH118238

## 2020-06-22 HISTORY — PX: INTRAVASCULAR IMAGING/OCT: CATH118326

## 2020-06-22 LAB — CBC
HCT: 31.8 % — ABNORMAL LOW (ref 36.0–46.0)
Hemoglobin: 9.6 g/dL — ABNORMAL LOW (ref 12.0–15.0)
MCH: 30 pg (ref 26.0–34.0)
MCHC: 30.2 g/dL (ref 30.0–36.0)
MCV: 99.4 fL (ref 80.0–100.0)
Platelets: 329 10*3/uL (ref 150–400)
RBC: 3.2 MIL/uL — ABNORMAL LOW (ref 3.87–5.11)
RDW: 15.7 % — ABNORMAL HIGH (ref 11.5–15.5)
WBC: 12.5 10*3/uL — ABNORMAL HIGH (ref 4.0–10.5)
nRBC: 0 % (ref 0.0–0.2)

## 2020-06-22 LAB — BASIC METABOLIC PANEL
Anion gap: 12 (ref 5–15)
BUN: 16 mg/dL (ref 6–20)
CO2: 28 mmol/L (ref 22–32)
Calcium: 9.1 mg/dL (ref 8.9–10.3)
Chloride: 97 mmol/L — ABNORMAL LOW (ref 98–111)
Creatinine, Ser: 6.44 mg/dL — ABNORMAL HIGH (ref 0.44–1.00)
GFR, Estimated: 7 mL/min — ABNORMAL LOW (ref >60)
Glucose, Bld: 123 mg/dL — ABNORMAL HIGH (ref 70–99)
Potassium: 3.8 mmol/L (ref 3.5–5.1)
Sodium: 137 mmol/L (ref 135–145)

## 2020-06-22 LAB — POCT ACTIVATED CLOTTING TIME
Activated Clotting Time: 255 seconds
Activated Clotting Time: 327 seconds

## 2020-06-22 LAB — HEPARIN LEVEL (UNFRACTIONATED): Heparin Unfractionated: 0.62 IU/mL (ref 0.30–0.70)

## 2020-06-22 SURGERY — CORONARY STENT INTERVENTION
Anesthesia: LOCAL

## 2020-06-22 MED ORDER — LIDOCAINE HCL (PF) 1 % IJ SOLN
INTRAMUSCULAR | Status: AC
  Filled 2020-06-22: qty 30

## 2020-06-22 MED ORDER — HEPARIN (PORCINE) IN NACL 1000-0.9 UT/500ML-% IV SOLN
INTRAVENOUS | Status: DC | PRN
  Administered 2020-06-22 (×2): 500 mL

## 2020-06-22 MED ORDER — FENTANYL CITRATE (PF) 100 MCG/2ML IJ SOLN
INTRAMUSCULAR | Status: AC
  Filled 2020-06-22: qty 2

## 2020-06-22 MED ORDER — HEPARIN (PORCINE) IN NACL 1000-0.9 UT/500ML-% IV SOLN
INTRAVENOUS | Status: AC
  Filled 2020-06-22: qty 1500

## 2020-06-22 MED ORDER — CHLORHEXIDINE GLUCONATE CLOTH 2 % EX PADS: 6.0000 | MEDICATED_PAD | Freq: Every day | CUTANEOUS | Status: DC

## 2020-06-22 MED ORDER — NITROGLYCERIN 1 MG/10 ML FOR IR/CATH LAB
INTRA_ARTERIAL | Status: DC | PRN
  Administered 2020-06-22: 200 ug

## 2020-06-22 MED ORDER — MIDAZOLAM HCL 2 MG/2ML IJ SOLN
INTRAMUSCULAR | Status: AC
  Filled 2020-06-22: qty 2

## 2020-06-22 MED ORDER — HYDRALAZINE HCL 20 MG/ML IJ SOLN: 10.0000 mg | INTRAMUSCULAR | Status: AC | PRN

## 2020-06-22 MED ORDER — SODIUM CHLORIDE 0.9% FLUSH
3.0000 mL | INTRAVENOUS | Status: DC | PRN
Start: 2020-06-22 — End: 2020-06-23

## 2020-06-22 MED ORDER — LIDOCAINE HCL (PF) 1 % IJ SOLN
INTRAMUSCULAR | Status: DC | PRN
  Administered 2020-06-22: 2 mL
  Administered 2020-06-22: 18 mL

## 2020-06-22 MED ORDER — ASPIRIN 81 MG PO CHEW
81.0000 mg | CHEWABLE_TABLET | ORAL | Status: AC
  Administered 2020-06-22: 81 mg via ORAL
  Filled 2020-06-22: qty 1

## 2020-06-22 MED ORDER — HEPARIN SODIUM (PORCINE) 1000 UNIT/ML IJ SOLN
INTRAMUSCULAR | Status: DC | PRN
  Administered 2020-06-22: 10000 [IU] via INTRAVENOUS
  Administered 2020-06-22: 3000 [IU] via INTRAVENOUS

## 2020-06-22 MED ORDER — SODIUM CHLORIDE 0.9 % IV SOLN: INTRAVENOUS | Status: DC

## 2020-06-22 MED ORDER — FENTANYL CITRATE (PF) 100 MCG/2ML IJ SOLN
INTRAMUSCULAR | Status: DC | PRN
  Administered 2020-06-22: 50 ug via INTRAVENOUS

## 2020-06-22 MED ORDER — IOHEXOL 350 MG/ML SOLN
INTRAVENOUS | Status: DC | PRN
  Administered 2020-06-22: 150 mL

## 2020-06-22 MED ORDER — VIPERSLIDE LUBRICANT OPTIME
TOPICAL | Status: DC | PRN
  Administered 2020-06-22: 20 mL via SURGICAL_CAVITY

## 2020-06-22 MED ORDER — SODIUM CHLORIDE 0.9 % IV SOLN
250.0000 mL | INTRAVENOUS | Status: DC | PRN
Start: 2020-06-22 — End: 2020-06-23

## 2020-06-22 MED ORDER — NITROGLYCERIN 1 MG/10 ML FOR IR/CATH LAB
INTRA_ARTERIAL | Status: AC
  Filled 2020-06-22: qty 10

## 2020-06-22 MED ORDER — SODIUM CHLORIDE 0.9 % IV SOLN
250.0000 mL | INTRAVENOUS | Status: DC | PRN
Start: 2020-06-22 — End: 2020-06-22

## 2020-06-22 MED ORDER — MIDAZOLAM HCL 2 MG/2ML IJ SOLN
INTRAMUSCULAR | Status: DC | PRN
  Administered 2020-06-22 (×2): 1 mg via INTRAVENOUS

## 2020-06-22 MED ORDER — VERAPAMIL HCL 2.5 MG/ML IV SOLN
INTRAVENOUS | Status: AC
  Filled 2020-06-22: qty 2

## 2020-06-22 MED ORDER — HEPARIN SODIUM (PORCINE) 1000 UNIT/ML IJ SOLN
INTRAMUSCULAR | Status: AC
  Filled 2020-06-22: qty 1

## 2020-06-22 MED ORDER — SODIUM CHLORIDE 0.9% FLUSH: 3.0000 mL | Freq: Two times a day (BID) | INTRAVENOUS | Status: DC

## 2020-06-22 MED ORDER — ASPIRIN 81 MG PO CHEW: 81.0000 mg | CHEWABLE_TABLET | Freq: Every day | ORAL | Status: DC

## 2020-06-22 MED ORDER — SODIUM CHLORIDE 0.9% FLUSH: 3.0000 mL | INTRAVENOUS | Status: DC | PRN

## 2020-06-22 SURGICAL SUPPLY — 21 items
BALLN SAPPHIRE 2.5X12 (BALLOONS) ×2
BALLN SAPPHIRE ~~LOC~~ 3.75X12 (BALLOONS) ×2 IMPLANT
BALLOON SAPPHIRE 2.5X12 (BALLOONS) ×1 IMPLANT
CATH DRAGONFLY OPSTAR (CATHETERS) ×2 IMPLANT
CATH VISTA GUIDE 6FR XB3.5 (CATHETERS) ×2 IMPLANT
CLOSURE PERCLOSE PROSTYLE (VASCULAR PRODUCTS) ×2 IMPLANT
CROWN DIAMONDBACK CLASSIC 1.25 (BURR) ×2 IMPLANT
ELECT DEFIB PAD ADLT CADENCE (PAD) ×2 IMPLANT
KIT ENCORE 26 ADVANTAGE (KITS) ×2 IMPLANT
KIT HEART LEFT (KITS) ×2 IMPLANT
KIT MICROPUNCTURE NIT STIFF (SHEATH) ×2 IMPLANT
LUBRICANT VIPERSLIDE CORONARY (MISCELLANEOUS) ×2 IMPLANT
PACK CARDIAC CATHETERIZATION (CUSTOM PROCEDURE TRAY) ×2 IMPLANT
SHEATH PINNACLE 6F 10CM (SHEATH) ×2 IMPLANT
SHEATH PROBE COVER 6X72 (BAG) ×2 IMPLANT
STENT RESOLUTE ONYX 3.5X15 (Permanent Stent) ×2 IMPLANT
TRANSDUCER W/STOPCOCK (MISCELLANEOUS) ×2 IMPLANT
TUBING CIL FLEX 10 FLL-RA (TUBING) ×2 IMPLANT
WIRE EMERALD 3MM-J .035X150CM (WIRE) ×2 IMPLANT
WIRE RUNTHROUGH .014X180CM (WIRE) ×2 IMPLANT
WIRE VIPERWIRE COR FLEX .012 (WIRE) ×2 IMPLANT

## 2020-06-22 NOTE — Care Management Important Message (Signed)
Important Message  Patient Details  Name: Erin Dean MRN: [id number] Date of Birth: [birth year]   Medicare Important Message Given:  Yes     Orbie Pyo 06/22/2020, 3:09 PM

## 2020-06-22 NOTE — Progress Notes (Addendum)
[place] for heparin Indication: atrial fibrillation  Allergies  Allergen Reactions  . Chlorhexidine Gluconate Rash    Patient Measurements: Height: '5\' 8"'$  (172.7 cm) Weight: 108.5 kg (239 lb 3.2 oz) IBW/kg (Calculated) : 63.9 Heparin Dosing Weight: 85kg  Vital Signs: Temp: 99.1 F (37.3 C) (03/16 0731) Temp Source: Oral (03/16 0731) BP: 129/67 (03/16 0731)  Labs: Recent Labs    06/20/20 0027 06/20/20 1106 06/21/20 0554 06/22/20 0105  HGB 8.7* 8.4* 8.6* 9.6*  HCT 28.0* 27.5* 28.5* 31.8*  PLT 265 250 271 329  HEPARINUNFRC 0.54  --  0.72* 0.62  CREATININE 7.47* 8.50* 10.00* 6.44*    Estimated Creatinine Clearance: 12.7 mL/min (A) (by C-G formula based on SCr of 6.44 mg/dL (H)).   Medical History: Past Medical History:  Diagnosis Date  . ESRD (end stage renal disease) ([place])    [facility] [place]   . Hypertension   . Stroke ([place]) 04/2017   no residual . Limp left side  . SVT (supraventricular tachycardia) (HCC)     Assessment: 36 yoF admitted with AFib RVR. No AC PTA. Pharmacy to begin heparin infusion.  Pt s/p cath, heparin restarted after. CBC stable, no bleeding noted or infusion issues. Heparin level came back at goal at 0.62, on 950 units/hr.  Goal of Therapy:  Heparin level 0.3-0.7 units/ml Monitor platelets by anticoagulation protocol: Yes   Plan:  Continue heparin at 950 units/h Daily heparin level and CBC F/u after cath today  Antonietta Jewel, PharmD, [place] Pharmacist  Phone: [phone] 06/22/2020 8:05 AM  Please check AMION for all [place] phone numbers After 10:00 PM, call [place] [phone]

## 2020-06-22 NOTE — TOC Benefit Eligibility Note (Signed)
Transition of Care [facility]) Benefit Eligibility Note    Patient Details  Name: Erin Dean MRN: [id number] Date of Birth: [birth year]   Medication/Dose: Eliquis 2,26m. and or 5 mg, bid for 30 day supply  Covered?: Yes  Tier: 3 Drug  Prescription Coverage [facility]erred [facility]: [facility]  Spoke with Person/Company/Phone Number:: Tonya H. [employer] PH# [phone] Co-Pay: Zero  Prior Approval: No  Deductible: Met       HShelda AltesPhone Number: 06/22/2020, 8:55 AM

## 2020-06-22 NOTE — Hospital Course (Addendum)
55 year old black female ESRD, CVA, HFpEF EF 60%, HTN-breakthrough COVID and hospitalized 12/23 for Sedation?  2/2 acute left frontal lobe infarct Discharged on aspirin Plavix?  Took Brilinta subsequently  has implanted ILR At that hospitalization TEE recommended for mobile echodensity aortic valve left cusp  She was discharged home and instructed to get outpatient rehab  Readmit 3/11 , 2/2  chest pain-cardiac cath performed 3/14 = severe LAD stenosis heavy calcification, Myoview + ischemia but poor CABG candidate and is scheduled for PCI to LAD 3/16 Because of polymorbidities [reactions to anesthesia obesity etc.]-very poor candidate for TEE repeat per cardiology  She had paroxysmal A. fib during this hospital stay and is currently on heparin gtt. in addition to her usual Plavix   Data Potassium 3.8 BUNs/creatinine 16/6.4 White count 12.5 up from 10.0    Chest pain for cath and DES 3/16  ESRD TTS ?  Mobile echodensity aortic valve BMI 36 Prior breakthrough COVID CVA 03/31/2021 New onset A. fib on heparin, CHA2DS2-VASc >3 HTN  ASA '81mg'$  daily, Plavix '75mg'$  daily, Eliquis '5mg'$  BID, Carvedilol '25mg'$  BID, amlodipine '10mg'$  daily, atorvastatin '80mg'$  daily

## 2020-06-22 NOTE — Interval H&P Note (Signed)
Cath Lab Visit (complete for each Cath Lab visit)  Clinical Evaluation Leading to the Procedure:   ACS: Yes.    Non-ACS:   N/a     History and Physical Interval Note:  06/22/2020 1:25 PM  Erin Dean  has presented today for surgery, with the diagnosis of cad.  The various methods of treatment have been discussed with the patient and family. After consideration of risks, benefits and other options for treatment, the patient has consented to  Procedure(s): CORONARY STENT INTERVENTION (N/A) as a surgical intervention.  The patient's history has been reviewed, patient examined, no change in status, stable for surgery.  I have reviewed the patient's chart and labs.  Questions were answered to the patient's satisfaction.     [name]

## 2020-06-22 NOTE — Progress Notes (Signed)
[facility] ROUNDING NOTE   Subjective:   Brief history: 55 year old lady with end-stage renal disease Tuesday Thursday Saturday dialysis, history of hypertension diastolic heart failure multiple CVAs.  She was admitted     06/17/2020 with chest pain.  She is scheduled to undergo TEE for suspected aortic valve vegetation and high risk Myoview.  She dialyzes at [facility] center Tuesday Thursday Saturday schedule.  Her last dialysis 06/21/2020 with 1 L removed.  Status post cardiac catheterization 06/20/2020.  Appreciate assistance of cardiology. Single vessel coronary disease 95% ostial/proximal LAD stenosis with heavy calcification.  Recommendations are for aggressive secondary prevention.  Cardiology planning to do intervention 06/22/2020.  Next dialysis planned for 06/23/2020  Blood pressure 123/56 pulse 62 temperature 97.9  Sodium 137 potassium 3.8 chloride 97 CO2 28 BUN 16 creatinine 6.44 glucose 123 calcium 9.1 hemoglobin 9.6  Medications: Amlodipine 10 mg daily, atorvastatin 80 mg daily calcitriol 0.25 mcg daily Coreg 25 mg twice daily Sensipar 30 mg daily, clonidine 0.1 mg 3 times daily, Lexapro 10 mg daily, Lasix 80 mg twice daily, hydralazine 100 mg 3 times daily Protonix 40 mg daily Renvela 3.2 g with meals, Brilinta 90 mg twice daily                      Objective:  Vital signs in last 24 hours:  Temp:  [97.9 F (36.6 C)-98.8 F (37.1 C)] 97.9 F (36.6 C) (03/16 0419) Pulse Rate:  [49-60] 60 (03/15 1631) Resp:  [15-22] 20 (03/16 0419) BP: (82-126)/(49-70) 123/56 (03/16 0419) SpO2:  [93 %-95 %] 94 % (03/16 0419) Weight:  [108.5 kg-110 kg] 108.5 kg (03/15 1229)  Weight change:  Filed Weights   06/17/20 1937 06/21/20 0840 06/21/20 1229  Weight: 98.9 kg 110 kg 108.5 kg    Intake/Output: I/O last 3 completed shifts: In: 1030.7 [P.O.:850; I.V.:180.7] Out: 1050 [Other:1050]   Intake/Output this shift:  Total I/O In: 70.3 [I.V.:70.3] Out: -   General:  WDWN NAD Head: NCAT sclera not icteric MMM Neck: Supple. No JVD No masses Lungs: CTA bilaterally without wheezes, rales, or rhonchi. Breathing is unlabored. Heart: RRR with S1 S2 Abdomen: soft NT + BS Lower extremities:without edema or ischemic changes, no open wounds  Neuro: A & O  X 3. Moves all extremities spontaneously. Psych:  Responds to questions appropriately with a normal affect. Dialysis Access: LUE AVF +bruit    Basic Metabolic Panel: Recent Labs  Lab 06/18/20 0338 06/20/20 0027 06/20/20 1106 06/21/20 0554 06/22/20 0105  NA 139 138 140 139 137  K 4.9 4.4 4.4 5.0 3.8  CL 96* 97* 100 99 97*  CO2 '29 27 26 26 28  '$ GLUCOSE 101* 94 98 85 123*  BUN 41* 24* 26* 35* 16  CREATININE 9.69* 7.47* 8.50* 10.00* 6.44*  CALCIUM 8.7* 8.7* 8.4* 8.6* 9.1  MG  --   --   --  2.2  --   PHOS  --   --  6.2* 7.1*  --     Liver Function Tests: Recent Labs  Lab 06/17/20 2020 06/20/20 1106  AST 17  --   ALT 11  --   ALKPHOS 66  --   BILITOT 0.2*  --   PROT 8.1  --   ALBUMIN 3.5 3.1*   Recent Labs  Lab 06/17/20 2020  LIPASE 44   No results for input(s): AMMONIA in the last 168 hours.  CBC: Recent Labs  Lab 06/17/20 2020 06/18/20 0338 06/19/20 0741 06/20/20  [id number] 06/20/20 1106 06/21/20 0554 06/22/20 0105  WBC 10.1   < > 13.1* 12.2* 9.5 10.0 12.5*  NEUTROABS 7.1  --   --   --   --   --   --   HGB 9.5*   < > 9.2* 8.7* 8.4* 8.6* 9.6*  HCT 29.5*   < > 29.7* 28.0* 27.5* 28.5* 31.8*  MCV 97.4   < > 97.7 98.2 99.3 99.7 99.4  PLT 199   < > 228 265 250 271 329   < > = values in this interval not displayed.    Cardiac Enzymes: No results for input(s): CKTOTAL, CKMB, CKMBINDEX, TROPONINI in the last 168 hours.  BNP: Invalid input(s): POCBNP  CBG: No results for input(s): GLUCAP in the last 168 hours.  Microbiology: Results for orders placed or performed during the hospital encounter of 06/17/20  MRSA PCR Screening     Status: None   Collection Time: 06/18/20  2:20 PM    Specimen: Nasopharyngeal  Result Value Ref Range Status   MRSA by PCR NEGATIVE NEGATIVE Final    Comment:        The GeneXpert MRSA Assay (FDA approved for NASAL specimens only), is one component of a comprehensive MRSA colonization surveillance program. It is not intended to diagnose MRSA infection nor to guide or monitor treatment for MRSA infections. Performed at [facility], [place] [address] [place], [place] [address]     Coagulation Studies: No results for input(s): LABPROT, INR in the last 72 hours.  Urinalysis: No results for input(s): COLORURINE, LABSPEC, PHURINE, GLUCOSEU, HGBUR, BILIRUBINUR, KETONESUR, PROTEINUR, UROBILINOGEN, NITRITE, LEUKOCYTESUR in the last 72 hours.  Invalid input(s): APPERANCEUR    Imaging: CARDIAC CATHETERIZATION  Result Date: 06/20/2020 Conclusions: 1. Severe single-vessel coronary artery disease with 95% ostial/proximal LAD stenosis with heavy calcification.  Mild to moderate CAD also noted in mid/distal LAD, D1, ramus, intermedius, large OM1, codominant LCx, and RCA. 2. Normal left ventricular filling pressure. Recommendations: 1. Images reviewed with interventional team and Dr. Radford Pax.  Given multiple comorbitities, including recurrent CVA and ESRD, patient is not a good candidate for CABG.  Additionally, she has refused procedures necessitating general anesthesia.  We agree that the most favorable plan would be to proceed with atherectomy/PCI of the ostial/proximal LAD. 2. Stop ticagrelor after tonight's dose and load with clopidogrel 300 mg x 1 tomorrow morning, followed by 75 mg daily thereafter.  Anticipate discharge on apixaban and clopidogrel. 3. Restart IV heparin in the setting of paroxysmal atrial fibrillation and NSTEMI 8 hours after right femoral artery sheath removed. 4. Remove right femoral artery sheath with manual compression. 5. Aggressive secondary prevention. Nelva Bush, MD [facility]     Medications:   .  sodium chloride    . sodium chloride    . sodium chloride    . sodium chloride    . sodium chloride    . sodium chloride    . heparin 950 Units/hr (06/21/20 1920)   . amLODipine  10 mg Oral Daily  . aspirin  81 mg Oral Pre-Cath  . atorvastatin  80 mg Oral q1800  . calcitRIOL  0.25 mcg Oral Daily  . carvedilol  25 mg Oral BID WC  . cinacalcet  30 mg Oral Q breakfast  . cloNIDine  0.1 mg Oral Daily  . clopidogrel  75 mg Oral QAC breakfast  . escitalopram  10 mg Oral Daily  . heparin  3,000 Units Dialysis Once in dialysis  . pantoprazole  40 mg Oral Daily  . sevelamer carbonate  3,200 mg Oral TID with meals  . sodium chloride flush  3 mL Intravenous Q12H   sodium chloride, sodium chloride, sodium chloride, sodium chloride, sodium chloride, acetaminophen **OR** acetaminophen, ALPRAZolam, alteplase, heparin, lidocaine (PF), lidocaine-prilocaine, ondansetron **OR** ondansetron (ZOFRAN) IV, pentafluoroprop-tetrafluoroeth, sodium chloride flush  Assessment/ Plan:  Dialysis Orders:  [facility] TTS 4h 400/500 EDW 108 kg 2K/2Ca UFP 4  AVF Heparin 3000  Sensipar 180 TIW, Calcitriol 3.75 TIW, Mircera 60 q 2 wks (last 3/10)   Assessment/Plan: 1. Chest pain/elevated troponin --Cardiology following. Concern for aortic valve vegetation on outpatient Echo. Plan for Lexiscan and TEE.  Status post cardiac catheterization 06/20/2020 with singles vessel coronary disease 95% ostial/proximal LAD stenosis with heavy calcification.  Recommendations are for aggressive secondary prevention.  Cardiology planning intervention 06/22/2020 2. AFib - with RVR on admission. Controlled. On Coreg 25 bid. Heparin per pharmacy.  3. ESRD -  HD TTS. Continue on schedule. Next HD 3/15.  4. Hypertension/volume  -have discontinued hydralazine and reduced dose of clonidine.  Blood pressures appear improved this morning.  Patient continues on Lasix despite the fact no documented urine output.  Will discontinue Lasix. 5. Anemia  - Hgb  9.2   Recent ESA dose as outpatient. Follow trends .    6. Metabolic bone disease -  Continue binders, Sensipar, Calcitriol. Follow trends here.  7. Nutrition - Renal diet with fluid restriction  8. Hx prior CVA     LOS: [place] '@TODAY''@6'$ :12 AM

## 2020-06-22 NOTE — Progress Notes (Signed)
PROGRESS NOTE   Erin Dean  [id number] DOB: [birth year] DOA: 06/17/2020 PCP: Lois Huxley, PA  Brief Narrative:  55 year old black female ESRD, CVA, HFpEF EF 60%, HTN-breakthrough COVID and hospitalized 12/23 for Sedation?  2/2 acute left frontal lobe infarct Discharged on aspirin Plavix?  Took Brilinta subsequently  has implanted ILR At that hospitalization TEE recommended for mobile echodensity aortic valve left cusp  She was discharged home and instructed to get outpatient rehab  Readmit 3/11 , 2/2  chest pain-cardiac cath performed 3/14 = severe LAD stenosis heavy calcification, Myoview + ischemia but poor CABG candidate and is scheduled for PCI to LAD 3/16 Because of polymorbidities [reactions to anesthesia obesity etc.]-very poor candidate for TEE repeat per cardiology  She had paroxysmal A. fib during this hospital stay and is currently on heparin gtt. in addition to her usual Plavix  Hospital-Problem based course  Chest pain for cath and DES 3/16  Cath 3/14, repeat cath with a DES 3/16  Rest per cardiology- MEDS amlodipine 10 Coreg 25 twice daily clonidine 0.1 ESRD TTS  Defer to renal--HD 3/17--renal/cardiology managing  meds ?  Mobile echodensity aortic valve New onset A. fib on heparin, CHA2DS2-VASc >3  Anticoagulation to resume as per cardiology 3/17? BMI 36 Prior breakthrough COVID CVA 03/31/2021  Needs uninterrupted Plavix post procedure as per  cardiology when to resume HTN  Meds as above discussion  DVT prophylaxis: Heparin GTT currently Code Status: Full Family Communication: None Disposition:  Status is: Inpatient  Remains inpatient appropriate because:Hemodynamically unstable, Ongoing diagnostic testing needed not appropriate for outpatient work up and Unsafe d/c plan   Dispo: The patient is from: Home              Anticipated d/c is to: Homeafter seen by Nephro and Cardiology--will need HD prior to d/c              Patient currently is not  medically stable to d/c.   Difficult to place patient No       Consultants:   Cardiology  renal  Procedures: Cardiac cath as above 3/16  Antimicrobials: None   Subjective: Awake coherent  Some groin pain No cp Eating some   Objective: Vitals:   06/22/20 1457 06/22/20 1457 06/22/20 1502 06/22/20 1534  BP:  (!) 164/79 (!) 184/89 120/70  Pulse: (!) 56 (!) 59 64   Resp: (!) '28 19 16 19  '$ Temp:    98 F (36.7 C)  TempSrc:    Oral  SpO2: 100% 100% 100% 93%  Weight:      Height:        Intake/Output Summary (Last 24 hours) at 06/22/2020 1641 Last data filed at 06/22/2020 1200 Gross per 24 hour  Intake 154.6 ml  Output --  Net 154.6 ml   Filed Weights   06/17/20 1937 06/21/20 0840 06/21/20 1229  Weight: 98.9 kg 110 kg 108.5 kg    Examination:  Obese pelasant in nad no focal deficit cta b no added sound Groin breifly visualized [no chaperone] abd soft nt nd no rebound Neuro intact   Data Reviewed: personally reviewed   Data Potassium 3.8 BUNs/creatinine 16/6.4 White count 12.5 up from 10.0  Cath 3/16 Conclusion     Dist LM lesion is 15% stenosed.  1st Diag lesion is 70% stenosed.  Mid LAD lesion is 50% stenosed.  Ost LAD to Prox LAD lesion is 95% stenosed.  Dist LAD lesion is 30% stenosed.  1st Mrg lesion is 30% stenosed.  Dist Cx lesion is 60% stenosed with 60% stenosed side branch in LPAV.  Ramus lesion is 60% stenosed.  Post intervention, there is a 0% residual stenosis.  A drug-eluting stent was successfully placed using a STENT RESOLUTE ONYX 3.5X15.   Successful OCT guided orbital atherectomy and drug-eluting stent placement to the ostial LAD.  OCT images were overall suboptimal due to high flow state and difficulty clearing blood with contrast.   Scheduled Meds: . amLODipine  10 mg Oral Daily  . [START ON 06/23/2020] aspirin  81 mg Oral Daily  . atorvastatin  80 mg Oral q1800  . calcitRIOL  0.25 mcg Oral Daily  . carvedilol   25 mg Oral BID WC  . Chlorhexidine Gluconate Cloth  6 each Topical Q0600  . cinacalcet  30 mg Oral Q breakfast  . cloNIDine  0.1 mg Oral Daily  . clopidogrel  75 mg Oral QAC breakfast  . escitalopram  10 mg Oral Daily  . heparin  3,000 Units Dialysis Once in dialysis  . pantoprazole  40 mg Oral Daily  . sevelamer carbonate  3,200 mg Oral TID with meals  . sodium chloride flush  3 mL Intravenous Q12H  . sodium chloride flush  3 mL Intravenous Q12H   Continuous Infusions: . sodium chloride    . sodium chloride    . sodium chloride    . sodium chloride    . sodium chloride    . sodium chloride    . heparin 950 Units/hr (06/22/20 1200)     LOS: 4 days   Time spent: [place], MD [facility] To contact the attending provider between 7A-7P or the covering provider during after hours 7P-7A, please log into the web site [email or web address] and access using universal [place] password for that web site. If you do not have the password, please call the hospital operator.  06/22/2020, 4:41 PM

## 2020-06-22 NOTE — Plan of Care (Signed)
  Problem: Education: Goal: Knowledge of General Education information will improve Description: Including pain rating scale, medication(s)/side effects and non-pharmacologic comfort measures Outcome: Progressing   Problem: Health Behavior/Discharge Planning: Goal: Ability to manage health-related needs will improve Outcome: Progressing   Problem: Clinical Measurements: Goal: Ability to maintain clinical measurements within normal limits will improve Outcome: Progressing Goal: Will remain free from infection Outcome: Progressing Goal: Diagnostic test results will improve Outcome: Progressing Goal: Respiratory complications will improve Outcome: Progressing Goal: Cardiovascular complication will be avoided Outcome: Progressing   Problem: Activity: Goal: Risk for activity intolerance will decrease Outcome: Progressing   Problem: Nutrition: Goal: Adequate nutrition will be maintained Outcome: Progressing   Problem: Coping: Goal: Level of anxiety will decrease Outcome: Progressing   Problem: Elimination: Goal: Will not experience complications related to bowel motility Outcome: Progressing Goal: Will not experience complications related to urinary retention Outcome: Progressing   Problem: Pain Managment: Goal: General experience of comfort will improve Outcome: Progressing   Problem: Safety: Goal: Ability to remain free from injury will improve Outcome: Progressing   Problem: Skin Integrity: Goal: Risk for impaired skin integrity will decrease Outcome: Progressing   Problem: Education: Goal: Understanding of CV disease, CV risk reduction, and recovery process will improve Outcome: Progressing Goal: Individualized Educational Video(s) Outcome: Progressing   Problem: Cardiovascular: Goal: Ability to achieve and maintain adequate cardiovascular perfusion will improve Outcome: Progressing Goal: Vascular access site(s) Level 0-1 will be maintained Outcome:  Progressing

## 2020-06-23 ENCOUNTER — Telehealth: Payer: Self-pay | Admitting: Physician Assistant

## 2020-06-23 ENCOUNTER — Other Ambulatory Visit: Payer: Self-pay | Admitting: Physician Assistant

## 2020-06-23 ENCOUNTER — Encounter (HOSPITAL_COMMUNITY): Payer: Self-pay | Admitting: Cardiovascular Disease

## 2020-06-23 DIAGNOSIS — R0789 Other chest pain: Secondary | ICD-10-CM | POA: Diagnosis not present

## 2020-06-23 DIAGNOSIS — R778 Other specified abnormalities of plasma proteins: Secondary | ICD-10-CM | POA: Diagnosis not present

## 2020-06-23 DIAGNOSIS — N186 End stage renal disease: Secondary | ICD-10-CM | POA: Diagnosis not present

## 2020-06-23 DIAGNOSIS — I2510 Atherosclerotic heart disease of native coronary artery without angina pectoris: Secondary | ICD-10-CM

## 2020-06-23 DIAGNOSIS — I200 Unstable angina: Secondary | ICD-10-CM | POA: Diagnosis not present

## 2020-06-23 LAB — BASIC METABOLIC PANEL
Anion gap: 18 — ABNORMAL HIGH (ref 5–15)
BUN: 26 mg/dL — ABNORMAL HIGH (ref 6–20)
CO2: 17 mmol/L — ABNORMAL LOW (ref 22–32)
Calcium: 8.3 mg/dL — ABNORMAL LOW (ref 8.9–10.3)
Chloride: 99 mmol/L (ref 98–111)
Creatinine, Ser: 8.93 mg/dL — ABNORMAL HIGH (ref 0.44–1.00)
GFR, Estimated: 5 mL/min — ABNORMAL LOW (ref >60)
Glucose, Bld: 78 mg/dL (ref 70–99)
Potassium: 5.2 mmol/L — ABNORMAL HIGH (ref 3.5–5.1)
Sodium: 134 mmol/L — ABNORMAL LOW (ref 135–145)

## 2020-06-23 LAB — CBC
HCT: 27.8 % — ABNORMAL LOW (ref 36.0–46.0)
Hemoglobin: 8.8 g/dL — ABNORMAL LOW (ref 12.0–15.0)
MCH: 31.1 pg (ref 26.0–34.0)
MCHC: 31.7 g/dL (ref 30.0–36.0)
MCV: 98.2 fL (ref 80.0–100.0)
Platelets: 217 10*3/uL (ref 150–400)
RBC: 2.83 MIL/uL — ABNORMAL LOW (ref 3.87–5.11)
RDW: 15.8 % — ABNORMAL HIGH (ref 11.5–15.5)
WBC: 11.9 10*3/uL — ABNORMAL HIGH (ref 4.0–10.5)
nRBC: 0 % (ref 0.0–0.2)

## 2020-06-23 LAB — HEPATITIS B SURFACE ANTIGEN: Hepatitis B Surface Ag: NONREACTIVE

## 2020-06-23 LAB — IRON AND TIBC
Iron: 29 ug/dL (ref 28–170)
Saturation Ratios: 14 % (ref 10.4–31.8)
TIBC: 213 ug/dL — ABNORMAL LOW (ref 250–450)
UIBC: 184 ug/dL

## 2020-06-23 MED ORDER — APIXABAN 5 MG PO TABS: 5.0000 mg | ORAL_TABLET | Freq: Two times a day (BID) | ORAL | 12 refills | Status: AC

## 2020-06-23 MED ORDER — CLOPIDOGREL BISULFATE 75 MG PO TABS: 75.0000 mg | ORAL_TABLET | Freq: Every day | ORAL | 12 refills | Status: AC

## 2020-06-23 MED ORDER — DARBEPOETIN ALFA 100 MCG/0.5ML IJ SOSY
100.0000 ug | PREFILLED_SYRINGE | INTRAMUSCULAR | Status: DC
  Administered 2020-06-23: 100 ug via INTRAVENOUS

## 2020-06-23 MED ORDER — DARBEPOETIN ALFA 100 MCG/0.5ML IJ SOSY
PREFILLED_SYRINGE | INTRAMUSCULAR | Status: AC
  Filled 2020-06-23: qty 0.5

## 2020-06-23 MED ORDER — CALCITRIOL 0.25 MCG PO CAPS
ORAL_CAPSULE | ORAL | Status: AC
  Administered 2020-06-23: 0.25 ug
  Filled 2020-06-23: qty 1

## 2020-06-23 MED ORDER — ASPIRIN 81 MG PO CHEW: 81.0000 mg | CHEWABLE_TABLET | Freq: Every day | ORAL | 12 refills | Status: DC

## 2020-06-23 MED ORDER — APIXABAN 5 MG PO TABS
5.0000 mg | ORAL_TABLET | Freq: Two times a day (BID) | ORAL | Status: DC
  Filled 2020-06-23: qty 1

## 2020-06-23 MED FILL — Nitroglycerin IV Soln 100 MCG/ML in D5W: INTRA_ARTERIAL | Qty: 10 | Status: AC

## 2020-06-23 MED FILL — Verapamil HCl IV Soln 2.5 MG/ML: INTRAVENOUS | Qty: 2 | Status: AC

## 2020-06-23 NOTE — Telephone Encounter (Signed)
° °  Dr. Radford Pax relayed request for close post-hospital follow-up as well as labwork (lipid profile/ALT) in 6 weeks for this patient. She was discharged before we were able to relay this on her paperwork. Triage, can you please call her so she is aware of these appts? Please let her know her appointment is scheduled 4/1 with Callie at 9:45am, and she will also need to be reminded to come in around 4/27 for fasting labwork. I put in the lab orders already. Will also cc to [facility] pool as she needs the [facility] call as well. Thanks, [employer]

## 2020-06-23 NOTE — Progress Notes (Signed)
Progress Note  Patient Name: Erin Dean Date of Encounter: 06/23/2020  [facility] Cardiologist: Skeet Latch, MD   Subjective   Currently in HD  Cath showed severe 95% ostial/proximal LAD stenosis with heavy calcification and mild to moderate CAD in mid to distal LAD/D1/Ramus/Large OM1, RCA and codominant LCx.  S/P PCI of LAD yesterday  She has not had any further CP.    Inpatient Medications    Scheduled Meds: . amLODipine  10 mg Oral Daily  . aspirin  81 mg Oral Daily  . atorvastatin  80 mg Oral q1800  . calcitRIOL  0.25 mcg Oral Daily  . carvedilol  25 mg Oral BID WC  . Chlorhexidine Gluconate Cloth  6 each Topical Q0600  . cinacalcet  30 mg Oral Q breakfast  . clopidogrel  75 mg Oral QAC breakfast  . Darbepoetin Alfa      . darbepoetin (ARANESP) injection - DIALYSIS  100 mcg Intravenous Q Thu-HD  . escitalopram  10 mg Oral Daily  . heparin  3,000 Units Dialysis Once in dialysis  . pantoprazole  40 mg Oral Daily  . sevelamer carbonate  3,200 mg Oral TID with meals  . sodium chloride flush  3 mL Intravenous Q12H  . sodium chloride flush  3 mL Intravenous Q12H   Continuous Infusions: . sodium chloride    . sodium chloride    . sodium chloride    . sodium chloride    . sodium chloride    . sodium chloride     PRN Meds: sodium chloride, sodium chloride, sodium chloride, sodium chloride, sodium chloride, sodium chloride, acetaminophen **OR** acetaminophen, ALPRAZolam, alteplase, heparin, lidocaine (PF), lidocaine-prilocaine, ondansetron **OR** ondansetron (ZOFRAN) IV, pentafluoroprop-tetrafluoroeth, sodium chloride flush, sodium chloride flush   Vital Signs    Vitals:   06/23/20 0900 06/23/20 0930 06/23/20 1000 06/23/20 1030  BP: 125/80 (!) 134/48 116/72 105/62  Pulse:      Resp: (!) 24 (!) 25  (!) 7  Temp:      TempSrc:      SpO2:      Weight:      Height:        Intake/Output Summary (Last 24 hours) at 06/23/2020 1046 Last data filed at  06/22/2020 1915 Gross per 24 hour  Intake 467.01 ml  Output --  Net 467.01 ml   Last 3 Weights 06/23/2020 06/22/2020 06/21/2020  Weight (lbs) 249 lb 5.4 oz 235 lb 14.3 oz 239 lb 3.2 oz  Weight (kg) 113.1 kg 107 kg 108.5 kg      Telemetry    NSR with PVCs- Personally Reviewed  ECG    NSR, LVH with repol abnormality - Personally Reviewed  Physical Exam   GEN: Well nourished, well developed in no acute distress HEENT: Normal NECK: No JVD; No carotid bruits LYMPHATICS: No lymphadenopathy CARDIAC:RRR, no murmurs, rubs, gallops RESPIRATORY:  Clear to auscultation without rales, wheezing or rhonchi  ABDOMEN: Soft, non-tender, non-distended MUSCULOSKELETAL:  No edema; No deformity  SKIN: Warm and dry NEUROLOGIC:  Alert and oriented x 3 PSYCHIATRIC:  Normal affect     Labs    High Sensitivity Troponin:   Recent Labs  Lab 06/17/20 2020 06/17/20 2225 06/18/20 0338  TROPONINIHS 174* 233* 367*      Chemistry Recent Labs  Lab 06/17/20 2020 06/18/20 0338 06/20/20 1106 06/21/20 0554 06/22/20 0105 06/23/20 0055  NA 140   < > 140 139 137 134*  K 4.4   < > 4.4 5.0 3.8  5.2*  CL 97*   < > 100 99 97* 99  CO2 27   < > '26 26 28 '$ 17*  GLUCOSE 111*   < > 98 85 123* 78  BUN 33*   < > 26* 35* 16 26*  CREATININE 8.61*   < > 8.50* 10.00* 6.44* 8.93*  CALCIUM 8.8*   < > 8.4* 8.6* 9.1 8.3*  PROT 8.1  --   --   --   --   --   ALBUMIN 3.5  --  3.1*  --   --   --   AST 17  --   --   --   --   --   ALT 11  --   --   --   --   --   ALKPHOS 66  --   --   --   --   --   BILITOT 0.2*  --   --   --   --   --   GFRNONAA 5*   < > 5* 4* 7* 5*  ANIONGAP 16*   < > '14 14 12 '$ 18*   < > = values in this interval not displayed.     Hematology Recent Labs  Lab 06/21/20 0554 06/22/20 0105 06/23/20 0055  WBC 10.0 12.5* 11.9*  RBC 2.86* 3.20* 2.83*  HGB 8.6* 9.6* 8.8*  HCT 28.5* 31.8* 27.8*  MCV 99.7 99.4 98.2  MCH 30.1 30.0 31.1  MCHC 30.2 30.2 31.7  RDW 15.6* 15.7* 15.8*  PLT 271 329  217    BNPNo results for input(s): BNP, PROBNP in the last 168 hours.   DDimer No results for input(s): DDIMER in the last 168 hours.   Radiology    CARDIAC CATHETERIZATION  Result Date: 06/22/2020  Dist LM lesion is 15% stenosed.  1st Diag lesion is 70% stenosed.  Mid LAD lesion is 50% stenosed.  Ost LAD to Prox LAD lesion is 95% stenosed.  Dist LAD lesion is 30% stenosed.  1st Mrg lesion is 30% stenosed.  Dist Cx lesion is 60% stenosed with 60% stenosed side branch in LPAV.  Ramus lesion is 60% stenosed.  Post intervention, there is a 0% residual stenosis.  A drug-eluting stent was successfully placed using a STENT RESOLUTE ONYX 3.5X15.  Successful OCT guided orbital atherectomy and drug-eluting stent placement to the ostial LAD.  OCT images were overall suboptimal due to high flow state and difficulty clearing blood with contrast. Recommendations: Dual antiplatelet therapy with aspirin and clopidogrel.  Aspirin can be discontinued after 1 week to minimize the risk of bleeding given that she is on anticoagulation. Anticoagulation can be resumed tomorrow if no bleeding issues from the right femoral artery.    Cardiac Studies   Cardiac Cath 06/20/2020 Conclusion  Conclusions: 1. Severe single-vessel coronary artery disease with 95% ostial/proximal LAD stenosis with heavy calcification.  Mild to moderate CAD also noted in mid/distal LAD, D1, ramus, intermedius, large OM1, codominant LCx, and RCA. 2. Normal left ventricular filling pressure.  Recommendations: 1. Images reviewed with interventional team and Dr. Radford Pax.  Given multiple comorbitities, including recurrent CVA and ESRD, patient is not a good candidate for CABG.  Additionally, she has refused procedures necessitating general anesthesia.  We agree that the most favorable plan would be to proceed with atherectomy/PCI of the ostial/proximal LAD. 2. Stop ticagrelor after tonight's dose and load with clopidogrel 300 mg x 1  tomorrow morning, followed by 75 mg daily thereafter.  Anticipate discharge on  apixaban and clopidogrel. 3. Restart IV heparin in the setting of paroxysmal atrial fibrillation and NSTEMI 8 hours after right femoral artery sheath removed. 4. Remove right femoral artery sheath with manual compression. 5. Aggressive secondary prevention.  Diagnostic Dominance: Co-dominant     06/19/2020 IMPRESSION: 1. There is an infarct in the inferior wall extending from mid wall to apex. There is a moderate size reversible defect in the anterior septal wall towards the apex.  2. Global hypokinesis. No wall thickening in the region of infarct. Severely abnormal end-diastolic volume and end systolic volume.  3. Left ventricular ejection fraction 44%  4. Non invasive risk stratification*: High  ECHO 06/19/2020 IMPRESSIONS   1. The aortic valve is tricuspid and the leaflets are moderately  calcified. There is trivial aortic regurgitation. There is no apparent  vegetation on the current study. It appears to just be calcium on the  valve. Additionally, there is no significant  regurgitation, which goes against a destructive lesion such as  endocarditis. If there are clinical concerns for endocarditis, would  recommend a TEE. The aortic valve is tricuspid. There is moderate  calcification of the aortic valve. There is moderate  thickening of the aortic valve. Aortic valve regurgitation is trivial.  Mild to moderate aortic valve sclerosis/calcification is present, without  any evidence of aortic stenosis.  2. Left ventricular ejection fraction, by estimation, is 60 to 65%. The  left ventricle has normal function. The left ventricle has no regional  wall motion abnormalities.  3. A small pericardial effusion is present. The pericardial effusion is  circumferential.  4. The mitral valve is degenerative. Mild mitral valve regurgitation. No  evidence of mitral stenosis.  5. There is mildly  elevated pulmonary artery systolic pressure.   Comparison(s): No prior Echocardiogram.  Echo 04/02/20: 1. When compared to the prior study from 03/10/2018 aortc valve is now  thicker with a possible mobile echodensity attached to the left coronary  cusp. A TEE is recommended for further evaluation.  2. Left ventricular ejection fraction, by estimation, is 65 to 70%. The  left ventricle has normal function. The left ventricle has no regional  wall motion abnormalities. Left ventricular diastolic parameters are  consistent with Grade II diastolic  dysfunction (pseudonormalization). Elevated left atrial pressure.  3. Right ventricular systolic function is normal. The right ventricular  size is normal. There is mildly elevated pulmonary artery systolic  pressure.  4. Left atrial size was mildly dilated.  5. The mitral valve is normal in structure. No evidence of mitral valve  regurgitation. No evidence of mitral stenosis.  6. The aortic valve is normal in structure. There is mild calcification  of the aortic valve. There is severe thickening of the aortic valve.  Aortic valve regurgitation is mild. No aortic stenosis is present.  7. The inferior vena cava is normal in size with greater than 50%  respiratory variability, suggesting right atrial pressure of 3 mmHg.   Patient Profile     55 y.o. female with a history of ESRD on HD, poorly controlled HTN, noncompliance, morbid obesity, chronic diastolic heart failure, and CVA (2019, 2021) who presented with chest pain found to be in SVT with elevated troponin for which cardiology has been consulted.  Assessment & Plan    #Paroxysmal Atrial fibrillation with RVR -Patient presented with chest pain and rapid afib with RVR -she has been maintaining sinus brady on tele -Continue coreg '25mg'$  BID -start apixaban '5mg'$  BID  #Concern for aortic valve vegetation: Patient noted  to have thicker aortic valve with possible mobile echodensity on the  [place] on surface TTE in 03/2020. TEE deferred at that time due to [place] and patient was recommended to follow-up, but she has not.  -patient adamently refused general sedation at this time as had bad experience with it in the past where she "would not wake up" and she "almost died." She is amenable to proceed with moderate sedation, however.  -Limited TTE done yesterday showed typical AVSC but no focal density that was worrisome for vegetation -I have personally reviewed the TTE from 06/19/20 and agree this is consistent with AVSC and likely more calcified AV in the setting of ESRD on HD>>No vegetation noted -she has no current infectious symptoms and remains afebrile -she has a source for prior CVAs with known PAF and TEE in 2019 for CVA with bubble study showed no PFO  #Chest pain/ASCAD: Developed in the setting of Afib with RVR. Resolved with conversion to NSR. HS troponin 174 --> 233 --> 367. ECG with TWI that were not present on priors. Suspect demand, however, patient has multiple risk factors for CAD.  Leane Call was high risk showing infarct in the inferior wall extending from mid wall to apex with moderate size reversible defect in the anterior septal wall towards the apex. -Cath showed severe 95% ostial/proximal LAD stenosis with heavy calcification and mild to moderate CAD in mid to distal LAD/D1/Ramus/Large OM1, RCA and codominant LCx.   -given her co morbidities not a good CABG candidate -s/p PCI of LAD yesterday with no further CP -Continue coreg '25mg'$  BID, lipitor '80mg'$  daily -She will be on triple therapy with ASA, Plavix '75mg'$  daily and Eliquis for 1 month and then Plavix and Eliquis  #Hypertension -Well controlled at 105/70mHg today -Continue  Amlodipine '10mg'$  daily and coreg '25mg'$  BID  #Chronic diastolic heart failure -Fluid management with HD -Continue antihypertensive agents as detailed above  #Hx of CVA (2019, 2021) -Patient with history of recurrent strokes likely  related to PAF.   -TEE in 2019 for CVA showed no PFO by bubble study -was on ASA and ticagrelor but changed to Clopidigrel due to need for DOAC on discharge  #Anemia of chronic disease #ESRD on HD - TTS - per primary  I have spent a total of 35 minutes with patient reviewing 2D echo from 03/2020 and 06/2020 , telemetry, EKGs, labs and examining patient as well as establishing an assessment and plan that was discussed with the patient.  > 50% of time was spent in direct patient care including risks of cath.  [facility] will sign off.   Medication Recommendations: ASA '81mg'$  daily, Plavix '75mg'$  daily, Eliquis '5mg'$  BID, Carvedilol '25mg'$  BID, amlodipine '10mg'$  daily, atorvastatin '80mg'$  daily Other recommendations (labs, testing, etc):  FLP and ALT In 6 weeks Follow up as an outpatient:  TOC followup 7-10 days with Dr. ROval Linsey For questions or updates, please contact [facility] JamesPlease consult [email or web address] for contact info under      Signed, TFransico Him MD  06/23/2020, 10:46 AM

## 2020-06-23 NOTE — Plan of Care (Signed)

## 2020-06-23 NOTE — Progress Notes (Addendum)
1258 Came to see pt to ed. In wheelchair and on her way out. Pt has CRP 2  Order by Dr Fletcher Anon. Will send stent card (found in chart), [facility] brochure and ed materials.   Pt  with HD T-TH-SAT. Will send information for referral to [facility] . Graylon Good RN BSN 06/23/2020 1:02 PM

## 2020-06-23 NOTE — Discharge Instructions (Signed)

## 2020-06-23 NOTE — Progress Notes (Signed)
[id number] Called pt at home to educate. Discussed importance of plavix with stent. Reviewed NTG use, walking for exercise,MI restrictions, and CRP 2. Pt stated not interested in CRP at this time but maybe in future. Referral has been made to [facility] [place]. Encouraged pt to refer to HD dietitian for diet information. Will send stent card, walking instructions, MI booklet and CRP 2 brochure. Pt voiced understanding. Graylon Good RN BSN 06/23/2020 3:14 PM

## 2020-06-23 NOTE — Progress Notes (Signed)
[facility] ROUNDING NOTE   Subjective:   Brief history: 55 year old lady with end-stage renal disease Tuesday Thursday Saturday dialysis, history of hypertension diastolic heart failure multiple CVAs.  She was admitted     06/17/2020 with chest pain.  She is scheduled to undergo TEE for suspected aortic valve vegetation and high risk Myoview.  She dialyzes at [facility] Tuesday Thursday Saturday schedule.  Her last dialysis 06/21/2020 with 1 L removed.  Status post cardiac catheterization 06/20/2020.  Appreciate assistance of cardiology. Single vessel coronary disease 95% ostial/proximal LAD stenosis with heavy calcification.  Recommendations are for aggressive secondary prevention.  Cardiology planning to do intervention 06/23/2020.  Next dialysis planned for 06/23/2020  Blood pressure 113/54 pulse 59 temperature 98.2 O2 sats 92% room air  Sodium 134 potassium 5.2 chloride 99 CO2 17 BUN 26 creatinine 8.93 glucose 78 calcium 8.3 hemoglobin 8.8  Medications: Amlodipine 10 mg daily, atorvastatin 80 mg daily calcitriol 0.25 mcg daily Coreg 25 mg twice daily Sensipar 30 mg daily, clonidine 0.1 mg 3 times daily, Lexapro 10 mg daily, Lasix 80 mg twice daily, hydralazine 100 mg 3 times daily Protonix 40 mg daily Renvela 3.2 g with meals, Brilinta 90 mg twice daily                      Objective:  Vital signs in last 24 hours:  Temp:  [98 F (36.7 C)-99.1 F (37.3 C)] 98.2 F (36.8 C) (03/17 0400) Pulse Rate:  [51-110] 55 (03/17 0437) Resp:  [8-79] 19 (03/17 0437) BP: (91-184)/(46-89) 113/54 (03/17 0400) SpO2:  [91 %-100 %] 95 % (03/17 0437) Weight:  [107 kg] 107 kg (03/16 0800)  Weight change: -3 kg Filed Weights   06/21/20 0840 06/21/20 1229 06/22/20 0800  Weight: 110 kg 108.5 kg 107 kg    Intake/Output: I/O last 3 completed shifts: In: 1031.6 [P.O.:800; I.V.:231.6] Out: 1050 [Other:1050]   Intake/Output this shift:  Total I/O In: 280 [P.O.:280] Out: -    General: WDWN NAD Head: NCAT sclera not icteric MMM Neck: Supple. No JVD No masses Lungs: CTA bilaterally without wheezes, rales, or rhonchi. Breathing is unlabored. Heart: RRR with S1 S2 Abdomen: soft NT + BS Lower extremities:without edema or ischemic changes, no open wounds  Neuro: A & O  X 3. Moves all extremities spontaneously. Psych:  Responds to questions appropriately with a normal affect. Dialysis Access: LUE AVF +bruit    Basic Metabolic Panel: Recent Labs  Lab 06/20/20 0027 06/20/20 1106 06/21/20 0554 06/22/20 0105 06/23/20 0055  NA 138 140 139 137 134*  K 4.4 4.4 5.0 3.8 5.2*  CL 97* 100 99 97* 99  CO2 '27 26 26 28 '$ 17*  GLUCOSE 94 98 85 123* 78  BUN 24* 26* 35* 16 26*  CREATININE 7.47* 8.50* 10.00* 6.44* 8.93*  CALCIUM 8.7* 8.4* 8.6* 9.1 8.3*  MG  --   --  2.2  --   --   PHOS  --  6.2* 7.1*  --   --     Liver Function Tests: Recent Labs  Lab 06/17/20 2020 06/20/20 1106  AST 17  --   ALT 11  --   ALKPHOS 66  --   BILITOT 0.2*  --   PROT 8.1  --   ALBUMIN 3.5 3.1*   Recent Labs  Lab 06/17/20 2020  LIPASE 44   No results for input(s): AMMONIA in the last 168 hours.  CBC: Recent Labs  Lab 06/17/20 2020  06/18/20 0338 06/20/20 0027 06/20/20 1106 06/21/20 0554 06/22/20 0105 06/23/20 0055  WBC 10.1   < > 12.2* 9.5 10.0 12.5* 11.9*  NEUTROABS 7.1  --   --   --   --   --   --   HGB 9.5*   < > 8.7* 8.4* 8.6* 9.6* 8.8*  HCT 29.5*   < > 28.0* 27.5* 28.5* 31.8* 27.8*  MCV 97.4   < > 98.2 99.3 99.7 99.4 98.2  PLT 199   < > 265 250 271 329 217   < > = values in this interval not displayed.    Cardiac Enzymes: No results for input(s): CKTOTAL, CKMB, CKMBINDEX, TROPONINI in the last 168 hours.  BNP: Invalid input(s): POCBNP  CBG: No results for input(s): GLUCAP in the last 168 hours.  Microbiology: Results for orders placed or performed during the hospital encounter of 06/17/20  MRSA PCR Screening     Status: None   Collection Time:  06/18/20  2:20 PM   Specimen: Nasopharyngeal  Result Value Ref Range Status   MRSA by PCR NEGATIVE NEGATIVE Final    Comment:        The GeneXpert MRSA Assay (FDA approved for NASAL specimens only), is one component of a comprehensive MRSA colonization surveillance program. It is not intended to diagnose MRSA infection nor to guide or monitor treatment for MRSA infections. Performed at [facility], [place] [address], [place], [place] [address]     Coagulation Studies: No results for input(s): LABPROT, INR in the last 72 hours.  Urinalysis: No results for input(s): COLORURINE, LABSPEC, PHURINE, GLUCOSEU, HGBUR, BILIRUBINUR, KETONESUR, PROTEINUR, UROBILINOGEN, NITRITE, LEUKOCYTESUR in the last 72 hours.  Invalid input(s): APPERANCEUR    Imaging: CARDIAC CATHETERIZATION  Result Date: 06/22/2020  Dist LM lesion is 15% stenosed.  1st Diag lesion is 70% stenosed.  Mid LAD lesion is 50% stenosed.  Ost LAD to Prox LAD lesion is 95% stenosed.  Dist LAD lesion is 30% stenosed.  1st Mrg lesion is 30% stenosed.  Dist Cx lesion is 60% stenosed with 60% stenosed side branch in LPAV.  Ramus lesion is 60% stenosed.  Post intervention, there is a 0% residual stenosis.  A drug-eluting stent was successfully placed using a STENT RESOLUTE ONYX 3.5X15.  Successful OCT guided orbital atherectomy and drug-eluting stent placement to the ostial LAD.  OCT images were overall suboptimal due to high flow state and difficulty clearing blood with contrast. Recommendations: Dual antiplatelet therapy with aspirin and clopidogrel.  Aspirin can be discontinued after 1 week to minimize the risk of bleeding given that she is on anticoagulation. Anticoagulation can be resumed tomorrow if no bleeding issues from the right femoral artery.     Medications:   . sodium chloride    . sodium chloride    . sodium chloride    . sodium chloride    . sodium chloride    . sodium chloride     . amLODipine  10  mg Oral Daily  . aspirin  81 mg Oral Daily  . atorvastatin  80 mg Oral q1800  . calcitRIOL  0.25 mcg Oral Daily  . carvedilol  25 mg Oral BID WC  . Chlorhexidine Gluconate Cloth  6 each Topical Q0600  . cinacalcet  30 mg Oral Q breakfast  . cloNIDine  0.1 mg Oral Daily  . clopidogrel  75 mg Oral QAC breakfast  . escitalopram  10 mg Oral Daily  . heparin  3,000 Units Dialysis  Once in dialysis  . pantoprazole  40 mg Oral Daily  . sevelamer carbonate  3,200 mg Oral TID with meals  . sodium chloride flush  3 mL Intravenous Q12H  . sodium chloride flush  3 mL Intravenous Q12H   sodium chloride, sodium chloride, sodium chloride, sodium chloride, sodium chloride, sodium chloride, acetaminophen **OR** acetaminophen, ALPRAZolam, alteplase, heparin, lidocaine (PF), lidocaine-prilocaine, ondansetron **OR** ondansetron (ZOFRAN) IV, pentafluoroprop-tetrafluoroeth, sodium chloride flush, sodium chloride flush  Assessment/ Plan:  Dialysis Orders:  [facility] TTS 4h 400/500 EDW 108 kg 2K/2Ca UFP 4  AVF Heparin 3000  Sensipar 180 TIW, Calcitriol 3.75 TIW, Mircera 60 q 2 wks (last 3/10)   Assessment/Plan: 1. Chest pain/elevated troponin --Cardiology following. Concern for aortic valve vegetation on outpatient Echo. Plan for Lexiscan and TEE.  Status post cardiac catheterization 06/20/2020 with singles vessel coronary disease 95% ostial/proximal LAD stenosis with heavy calcification.  Recommendations are for aggressive secondary prevention.  Cardiology planning intervention 06/23/2020 2. AFib - with RVR on admission. Controlled. On Coreg 25 bid. Heparin per pharmacy.  3. ESRD -  HD TTS. Continue on schedule. Next HD 06/23/2020 4. Hypertension/volume  -we will discontinue clonidine at this point.  Blood pressures appear improved this morning  5. Anemia  -    Recent ESA dose as outpatient. Follow trends .   We will dose with additional dose of ESA check iron studies 6. Metabolic bone disease -  Continue binders,  Sensipar, Calcitriol. Follow trends here.  7. Nutrition - Renal diet with fluid restriction  8. Hx prior CVA     LOS: 5 Sherril Croon '@TODAY''@6'$ :23 AM

## 2020-06-23 NOTE — Progress Notes (Signed)
Patient refused CHG bath this morning.

## 2020-06-23 NOTE — Progress Notes (Signed)
RN gave pt discharge summary and removed PIV. Pt belongings with pt. Family member to transport pt home.

## 2020-06-23 NOTE — Discharge Summary (Signed)
Physician Discharge Summary  Erin Dean [id number] DOB: May 20, 1965 DOA: 06/17/2020  PCP: Lois Huxley, PA  Admit date: 06/17/2020 Discharge date: 06/23/2020  Time spent: 26 minutes  Recommendations for Outpatient Follow-up:  1. Needs aspirin Plavix Eliquis going forward and lab work in about 1 week 2. Will need repeat TEE/TTE as per Dr. Oval Linsey in the outpatient setting-I will forward to Dr. Oval Linsey to determine next steps 3. Eliquis started this admission-also?  New onset A. fib with hyper mobile density on aortic valve  Discharge Diagnoses:  MAIN problem for hospitalization   Chest pain showing LAD stenosis-very poor CABG candidate in addition to mobile echodensity on aortic valve ESRD  Please see below for itemized issues addressed in HOpsital- refer to other progress notes for clarity if needed  Discharge Condition: Fair  Diet recommendation: Renal   Filed Weights   06/21/20 1229 06/22/20 0800 06/23/20 0722  Weight: 108.5 kg 107 kg 113.1 kg    History of present illness:  55 year old black female ESRD, CVA, HFpEF EF 60%, HTN-breakthrough COVID and hospitalized 12/23 for Sedation?  2/2 acute left frontal lobe infarct Discharged on aspirin Plavix?  Took Brilinta subsequently  has implanted ILR At that hospitalization TEE recommended for mobile echodensity aortic valve left cusp  She was discharged home and instructed to get outpatient rehab  Readmit 3/11 , 2/2  chest pain-cardiac cath performed 3/14 = severe LAD stenosis heavy calcification, Myoview + ischemia but poor CABG candidate and is scheduled for PCI to LAD 3/16 Because of polymorbidities [reactions to anesthesia obesity etc.]-very poor candidate for TEE repeat per cardiology  She had paroxysmal A. fib during this hospital stay and is currently on heparin gtt. in addition to her usual Plavix  Hospital Course:   Chest pain for cath and DES 3/16             Cath 3/14, repeat cath with a DES 3/16              Rest per cardiology- MEDS on discharge include Coreg Plavix  aspirin and amlodipine-appreciate Dr. Theodosia Blender input ESRD TTS             Defer to renal--HD 3/17--renal/cardiology managing meds ?  Mobile echodensity aortic valve New onset A. fib on heparin, CHA2DS2-VASc >3             Eliquis 5 mg twice daily per cardiologist resumed on 3/17 and  heparin GTT discontinued  Outpatient echocardiogram versus other as per Dr. Oval Linsey  CC on this note BMI 36 Prior breakthrough COVID CVA 03/31/2021             Needs uninterrupted Plavix post procedure  HTN             Meds as above discussion BMI >37  Overall prognosis is fair only because of her multiple  comorbidities  May benefit from advanced care planning?  Procedures: Cath 3/16 Conclusion     Dist LM lesion is 15% stenosed.  1st Diag lesion is 70% stenosed.  Mid LAD lesion is 50% stenosed.  Ost LAD to Prox LAD lesion is 95% stenosed.  Dist LAD lesion is 30% stenosed.  1st Mrg lesion is 30% stenosed.  Dist Cx lesion is 60% stenosed with 60% stenosed side branch in LPAV.  Ramus lesion is 60% stenosed.  Post intervention, there is a 0% residual stenosis.  A drug-eluting stent was successfully placed using a STENT RESOLUTE ONYX 3.5X15.   Successful OCT guided orbital atherectomy and drug-eluting  stent placement to the ostial LAD.  OCT images were overall suboptimal due to high flow state and difficulty clearing blood with contrast.  Consultations:  Cardiology  Renal  Discharge Exam: Vitals:   06/23/20 1000 06/23/20 1030  BP: 116/72 105/62  Pulse:    Resp:  (!) 7  Temp:    SpO2:      Subj on day of d/c   Awake alert coherent seen on HD unit Pain in groin is better  General Exam on discharge  Awake alert pleasant "im ready to go" cta b no added sound, abd soft nt nd no rebound s1 s2  NSR Groin examined, no swelling mild discomfort Neuro intact no focal deficit  Discharge  Instructions   Discharge Instructions    AMB Referral to Cardiac Rehabilitation - Phase II   Complete by: As directed    Diagnosis: Coronary Stents   After initial evaluation and assessments completed: Virtual Based Care may be provided alone or in conjunction with Phase 2 Cardiac Rehab based on patient barriers.: Yes   Diet - low sodium heart healthy   Complete by: As directed    Discharge instructions   Complete by: As directed    Look at your medications carefully and please report any bleeding to your primary physician-you will need to be on aspirin Plavix and Eliquis going forward Please get blood count checks in the next week at your primary care physician office Dr. Oval Linsey from cardiology will see you in the outpatient setting and cardiology will set up an appointment for you on follow-up Your regular nephrologist will also be following up with you in the next several days for dialysis   Increase activity slowly   Complete by: As directed    No wound care   Complete by: As directed      Allergies as of 06/23/2020      Reactions   Chlorhexidine Gluconate Rash      Medication List    STOP taking these medications   cloNIDine 0.1 MG tablet Commonly known as: CATAPRES   furosemide 80 MG tablet Commonly known as: LASIX   hydrALAZINE 100 MG tablet Commonly known as: APRESOLINE   Meclizine HCl 25 MG Chew   multivitamin Tabs tablet   ticagrelor 90 MG Tabs tablet Commonly known as: BRILINTA     TAKE these medications   acetaminophen 325 MG tablet Commonly known as: TYLENOL Take 2 tablets (650 mg total) by mouth every 6 (six) hours as needed for mild pain (or Fever >/= 101).   ALPRAZolam 0.5 MG tablet Commonly known as: XANAX Take 0.5 mg by mouth daily as needed for anxiety.   amLODipine 10 MG tablet Commonly known as: NORVASC Take 1 tablet (10 mg total) by mouth daily.   apixaban 5 MG Tabs tablet Commonly known as: ELIQUIS Take 1 tablet (5 mg total) by  mouth 2 (two) times daily.   aspirin 81 MG chewable tablet Chew 1 tablet (81 mg total) by mouth daily.   atorvastatin 80 MG tablet Commonly known as: LIPITOR Take 1 tablet (80 mg total) by mouth daily at 6 PM.   calcitRIOL 0.25 MCG capsule Commonly known as: ROCALTROL Take 1 capsule (0.25 mcg total) by mouth daily.   carvedilol 25 MG tablet Commonly known as: COREG Take 1 tablet (25 mg total) by mouth 2 (two) times daily with a meal.   clopidogrel 75 MG tablet Commonly known as: PLAVIX Take 1 tablet (75 mg total) by mouth daily before  breakfast. Start taking on: June 24, 2020   cyanocobalamin 1000 MCG tablet Take 1 tablet (1,000 mcg total) by mouth daily.   escitalopram 10 MG tablet Commonly known as: LEXAPRO Take 1 tablet (10 mg total) by mouth daily.   ferrous sulfate 325 (65 FE) MG tablet Take 1 tablet (325 mg total) by mouth 3 (three) times daily with meals.   nitroGLYCERIN 0.4 MG SL tablet Commonly known as: NITROSTAT Place 0.4 mg under the tongue every 5 (five) minutes x 3 doses as needed for chest pain.   omeprazole 20 MG capsule Commonly known as: PRILOSEC Take 20 mg by mouth daily.   Renagel 800 MG tablet Generic drug: sevelamer Take 3,200 mg by mouth See admin instructions. Take 3200 mg with each meal and snack   SENSIPAR PO Take 1 tablet by mouth daily.      Allergies  Allergen Reactions  . Chlorhexidine Gluconate Rash      The results of significant diagnostics from this hospitalization (including imaging, microbiology, ancillary and laboratory) are listed below for reference.    Significant Diagnostic Studies: CARDIAC CATHETERIZATION  Result Date: 06/22/2020  Dist LM lesion is 15% stenosed.  1st Diag lesion is 70% stenosed.  Mid LAD lesion is 50% stenosed.  Ost LAD to Prox LAD lesion is 95% stenosed.  Dist LAD lesion is 30% stenosed.  1st Mrg lesion is 30% stenosed.  Dist Cx lesion is 60% stenosed with 60% stenosed side branch in LPAV.   Ramus lesion is 60% stenosed.  Post intervention, there is a 0% residual stenosis.  A drug-eluting stent was successfully placed using a STENT RESOLUTE ONYX 3.5X15.  Successful OCT guided orbital atherectomy and drug-eluting stent placement to the ostial LAD.  OCT images were overall suboptimal due to high flow state and difficulty clearing blood with contrast. Recommendations: Dual antiplatelet therapy with aspirin and clopidogrel.  Aspirin can be discontinued after 1 week to minimize the risk of bleeding given that she is on anticoagulation. Anticoagulation can be resumed tomorrow if no bleeding issues from the right femoral artery.   CARDIAC CATHETERIZATION  Result Date: 06/20/2020 Conclusions: 1. Severe single-vessel coronary artery disease with 95% ostial/proximal LAD stenosis with heavy calcification.  Mild to moderate CAD also noted in mid/distal LAD, D1, ramus, intermedius, large OM1, codominant LCx, and RCA. 2. Normal left ventricular filling pressure. Recommendations: 1. Images reviewed with interventional team and Dr. Radford Pax.  Given multiple comorbitities, including recurrent CVA and ESRD, patient is not a good candidate for CABG.  Additionally, she has refused procedures necessitating general anesthesia.  We agree that the most favorable plan would be to proceed with atherectomy/PCI of the ostial/proximal LAD. 2. Stop ticagrelor after tonight's dose and load with clopidogrel 300 mg x 1 tomorrow morning, followed by 75 mg daily thereafter.  Anticipate discharge on apixaban and clopidogrel. 3. Restart IV heparin in the setting of paroxysmal atrial fibrillation and NSTEMI 8 hours after right femoral artery sheath removed. 4. Remove right femoral artery sheath with manual compression. 5. Aggressive secondary prevention. Nelva Bush, MD [facility]   NM Myocar Multi W/Spect Tamela Oddi Motion / EF  Result Date: 06/19/2020 CLINICAL DATA:  Chest pain EXAM: MYOCARDIAL IMAGING WITH SPECT (REST AND  PHARMACOLOGIC-STRESS) GATED LEFT VENTRICULAR WALL MOTION STUDY LEFT VENTRICULAR EJECTION FRACTION TECHNIQUE: Standard myocardial SPECT imaging was performed after resting intravenous injection of 11 mCi Tc-68mtetrofosmin. Subsequently, intravenous infusion of Lexiscan was performed under the supervision of the Cardiology staff. At peak effect of the drug, 33  mCi Tc-4mtetrofosmin was injected intravenously and standard myocardial SPECT imaging was performed. Quantitative gated imaging was also performed to evaluate left ventricular wall motion, and estimate left ventricular ejection fraction. COMPARISON:  None. FINDINGS: Perfusion: There is a fixed defect in the inferior wall extending from mid wall the apex. There is a mildly reversible defect accounting for approximately 10% of left ventricular wall affecting the anterior septal wall. No other perfusion abnormalities. Wall Motion: Global hypokinesis. No wall thickening in the region of the inferior wall fixed defect. Left Ventricular Ejection Fraction: 44 % End diastolic volume [id number] End systolic volume 91 ml IMPRESSION: 1. There is an infarct in the inferior wall extending from mid wall to apex. There is a moderate size reversible defect in the anterior septal wall towards the apex. 2. Global hypokinesis. No wall thickening in the region of infarct. Severely abnormal end-diastolic volume and end systolic volume. 3. Left ventricular ejection fraction 44% 4. Non invasive risk stratification*: High *2012 Appropriate Use Criteria for Coronary Revascularization Focused Update: J Am Coll Cardiol. 2N6492421 http://content.oairportbarriers.comaspx?articleid=1201161 These results will be called to the ordering clinician or representative by the Radiologist Assistant, and communication documented in the PACS or [employer] Electronically Signed   By: DDorise BullionIII M.D   On: 06/19/2020 14:15   DG Chest Port 1 View  Result Date:  06/17/2020 CLINICAL DATA:  Chest pain EXAM: PORTABLE CHEST 1 VIEW COMPARISON:  March 31, 2020 FINDINGS: The cardiac silhouette is enlarged but stable from prior study. There is stable shift of the trachea to the right. There is no pneumothorax. No large pleural effusion or focal infiltrate. The previously demonstrated loop recorder is again noted. There is no acute osseous abnormality. IMPRESSION: Stable cardiomegaly. No acute cardiopulmonary process. Electronically Signed   By: CConstance HolsterM.D.   On: 06/17/2020 20:23   ECHOCARDIOGRAM LIMITED  Result Date: 06/19/2020    ECHOCARDIOGRAM LIMITED REPORT   Patient Name:   SSHARAINE LOOKABILLDate of Exam: 06/19/2020 Medical Rec #:  [id number]       Height:       68.0 in Accession #:    [id number]      Weight:       218.0 lb Date of Birth:  110-24-1967       BSA:          2.120 m Patient Age:    89years         BP:           185/105 mmHg Patient Gender: F                HR:           64 bpm. Exam Location:  Inpatient Procedure: Limited Echo and Color Doppler Indications:    possible aortic valve vegetation  History:        Patient has prior history of Echocardiogram examinations, most                 recent 04/02/2020. End stage renal disease, Signs/Symptoms:Chest                 Pain; Risk Factors:Hypertension.  Sonographer:    LJohny ChessReferring Phys: [id number]  Sonographer Comments: Patient is morbidly obese. Image acquisition challenging due to patient body habitus. IMPRESSIONS  1. The aortic valve is tricuspid and the leaflets are moderately calcified. There is trivial aortic regurgitation. There is no apparent vegetation on the current  study. It appears to just be calcium on the valve. Additionally, there is no significant regurgitation, which goes against a destructive lesion such as endocarditis. If there are clinical concerns for endocarditis, would recommend a TEE. The aortic valve is tricuspid. There is moderate  calcification of the aortic valve. There is moderate thickening of the aortic valve. Aortic valve regurgitation is trivial. Mild to moderate aortic valve sclerosis/calcification is present, without any evidence of aortic stenosis.  2. Left ventricular ejection fraction, by estimation, is 60 to 65%. The left ventricle has normal function. The left ventricle has no regional wall motion abnormalities.  3. A small pericardial effusion is present. The pericardial effusion is circumferential.  4. The mitral valve is degenerative. Mild mitral valve regurgitation. No evidence of mitral stenosis.  5. There is mildly elevated pulmonary artery systolic pressure. Comparison(s): No prior Echocardiogram. FINDINGS  Left Ventricle: Left ventricular ejection fraction, by estimation, is 60 to 65%. The left ventricle has normal function. The left ventricle has no regional wall motion abnormalities. Right Ventricle: There is mildly elevated pulmonary artery systolic pressure. The tricuspid regurgitant velocity is 3.01 m/s, and with an assumed right atrial pressure of 0 mmHg, the estimated right ventricular systolic pressure is 0000000 mmHg. Left Atrium: Left atrial size was normal in size. Right Atrium: Right atrial size was normal in size. Pericardium: A small pericardial effusion is present. The pericardial effusion is circumferential. Mitral Valve: The mitral valve is degenerative in appearance. Mild to moderate mitral annular calcification. Mild mitral valve regurgitation. No evidence of mitral valve stenosis. Tricuspid Valve: The tricuspid valve is grossly normal. Tricuspid valve regurgitation is trivial. No evidence of tricuspid stenosis. Aortic Valve: The aortic valve is tricuspid and the leaflets are moderately calcified. There is trivial aortic regurgitation. There is no apparent vegetation on the current study. It appears to just be calcium on the valve. Additionally, there is no significant regurgitation, which goes against a  destructive lesion such as endocarditis. If there are clinical concerns for endocarditis, would recommend a TEE. The aortic valve is tricuspid. There is moderate calcification of the aortic valve. There is moderate thickening of the aortic valve. Aortic valve regurgitation is trivial. Mild to moderate aortic valve sclerosis/calcification is present, without any evidence of aortic stenosis. Pulmonic Valve: The pulmonic valve was grossly normal. Pulmonic valve regurgitation is trivial. No evidence of pulmonic stenosis. Aorta: The aortic root and ascending aorta are structurally normal, with no evidence of dilitation. LEFT VENTRICLE PLAX 2D LVIDd:         4.90 cm LVIDs:         3.20 cm LVOT diam:     2.20 cm LV SV:         69 LV SV Index:   33 LVOT Area:     3.80 cm  AORTIC VALVE LVOT Vmax:   88.80 cm/s LVOT Vmean:  55.100 cm/s LVOT VTI:    0.182 m  AORTA Ao Root diam: 2.80 cm Ao Asc diam:  3.20 cm MITRAL VALVE               TRICUSPID VALVE MV Area (PHT): 3.17 cm    TR Peak grad:   36.2 mmHg MV Decel Time: 239 msec    TR Vmax:        301.00 cm/s MV E velocity: 80.60 cm/s MV A velocity: 77.60 cm/s  SHUNTS MV E/A ratio:  1.04        Systemic VTI:  0.18 m  Systemic Diam: 2.20 cm Eleonore Chiquito MD Electronically signed by Eleonore Chiquito MD Signature Date/Time: 06/19/2020/4:08:02 PM    Final     Microbiology: Recent Results (from the past 240 hour(s))  MRSA PCR Screening     Status: None   Collection Time: 06/18/20  2:20 PM   Specimen: Nasopharyngeal  Result Value Ref Range Status   MRSA by PCR NEGATIVE NEGATIVE Final    Comment:        The GeneXpert MRSA Assay (FDA approved for NASAL specimens only), is one component of a comprehensive MRSA colonization surveillance program. It is not intended to diagnose MRSA infection nor to guide or monitor treatment for MRSA infections. Performed at [facility], [place] [address] [place], [place] [address]      Labs: Basic  Metabolic Panel: Recent Labs  Lab 06/20/20 0027 06/20/20 1106 06/21/20 0554 06/22/20 0105 06/23/20 0055  NA 138 140 139 137 134*  K 4.4 4.4 5.0 3.8 5.2*  CL 97* 100 99 97* 99  CO2 '27 26 26 28 '$ 17*  GLUCOSE 94 98 85 123* 78  BUN 24* 26* 35* 16 26*  CREATININE 7.47* 8.50* 10.00* 6.44* 8.93*  CALCIUM 8.7* 8.4* 8.6* 9.1 8.3*  MG  --   --  2.2  --   --   PHOS  --  6.2* 7.1*  --   --    Liver Function Tests: Recent Labs  Lab 06/17/20 2020 06/20/20 1106  AST 17  --   ALT 11  --   ALKPHOS 66  --   BILITOT 0.2*  --   PROT 8.1  --   ALBUMIN 3.5 3.1*   Recent Labs  Lab 06/17/20 2020  LIPASE 44   No results for input(s): AMMONIA in the last 168 hours. CBC: Recent Labs  Lab 06/17/20 2020 06/18/20 0338 06/20/20 0027 06/20/20 1106 06/21/20 0554 06/22/20 0105 06/23/20 0055  WBC 10.1   < > 12.2* 9.5 10.0 12.5* 11.9*  NEUTROABS 7.1  --   --   --   --   --   --   HGB 9.5*   < > 8.7* 8.4* 8.6* 9.6* 8.8*  HCT 29.5*   < > 28.0* 27.5* 28.5* 31.8* 27.8*  MCV 97.4   < > 98.2 99.3 99.7 99.4 98.2  PLT 199   < > 265 250 271 329 217   < > = values in this interval not displayed.   Cardiac Enzymes: No results for input(s): CKTOTAL, CKMB, CKMBINDEX, TROPONINI in the last 168 hours. BNP: BNP (last 3 results) No results for input(s): BNP in the last 8760 hours.  ProBNP (last 3 results) No results for input(s): PROBNP in the last 8760 hours.  CBG: No results for input(s): GLUCAP in the last 168 hours.     Signed:  Nita Sells MD   [facility] 06/23/2020, 10:59 AM

## 2020-06-24 ENCOUNTER — Telehealth: Payer: Self-pay | Admitting: Nurse Practitioner

## 2020-06-24 NOTE — Telephone Encounter (Signed)
Transition of care contact from inpatient facility  Date of Discharge: 06/23/2020 Date of Contact: 06/24/2020 Method of contact: Phone  Attempted to contact patient to discuss transition of care from inpatient admission. Patient did not answer the phone. Message was left on the patient's voicemail with call back number [phone].

## 2020-06-24 NOTE — Telephone Encounter (Signed)
Date of Discharge: 06/23/2020  Date of Contact: 06/24/2020  Method of contact: Phone  Attempted to contact patient to discuss transition of care from inpatient admission. Patient did not answer the phone. Message was left on the patient's voicemail with call back number [phone].         SDOH       Social Determinants of Health  Expand All Collapse All   Intimate Partner Violence    Social Connections     Not on file   Not on file    Tobacco Use    Financial Resource Strain     Jun 23 2020: Low Risk    Not on file    Depression [id number])    Stress     Not on file   Not on file    Physical Activity    Food Insecurity     Not on file   Not on file    Transportation Needs     Alcohol Screen     Not on file    Not on file    Housing      Not on file         SDOH Interventions         [place] [phone])        ?  Routing  Meds & Orders  Therapist, sports    Route [id number] Copy  Low Priority  My List  Care Team  Other  Remove All Enter recipients   Pool for Replies:      Add a Warehouse manager Recipients   Close [employer] History       &  &&&Medications & Orders Comments         Patient-Reported

## 2020-06-24 NOTE — Telephone Encounter (Signed)
Attempted to call pt  TOC call .  Voicemail has not been set up unable to leave message. Will try later .Adonis Housekeeper

## 2020-06-25 DIAGNOSIS — D509 Iron deficiency anemia, unspecified: Secondary | ICD-10-CM | POA: Diagnosis not present

## 2020-06-25 DIAGNOSIS — N2581 Secondary hyperparathyroidism of renal origin: Secondary | ICD-10-CM | POA: Diagnosis not present

## 2020-06-25 DIAGNOSIS — R52 Pain, unspecified: Secondary | ICD-10-CM | POA: Diagnosis not present

## 2020-06-25 DIAGNOSIS — N186 End stage renal disease: Secondary | ICD-10-CM | POA: Diagnosis not present

## 2020-06-25 DIAGNOSIS — D689 Coagulation defect, unspecified: Secondary | ICD-10-CM | POA: Diagnosis not present

## 2020-06-25 DIAGNOSIS — Z992 Dependence on renal dialysis: Secondary | ICD-10-CM | POA: Diagnosis not present

## 2020-06-27 ENCOUNTER — Telehealth (HOSPITAL_COMMUNITY): Payer: Self-pay

## 2020-06-27 NOTE — Telephone Encounter (Signed)
Per Phase I pt is not interested in CR at this time.  Closed referral

## 2020-06-27 NOTE — Telephone Encounter (Signed)
Unable to reach pt or leave a message mailbox is not set up.

## 2020-06-28 DIAGNOSIS — N186 End stage renal disease: Secondary | ICD-10-CM | POA: Diagnosis not present

## 2020-06-28 DIAGNOSIS — D689 Coagulation defect, unspecified: Secondary | ICD-10-CM | POA: Diagnosis not present

## 2020-06-28 DIAGNOSIS — D631 Anemia in chronic kidney disease: Secondary | ICD-10-CM | POA: Diagnosis not present

## 2020-06-28 DIAGNOSIS — D509 Iron deficiency anemia, unspecified: Secondary | ICD-10-CM | POA: Diagnosis not present

## 2020-06-28 DIAGNOSIS — I4891 Unspecified atrial fibrillation: Secondary | ICD-10-CM | POA: Diagnosis not present

## 2020-06-28 DIAGNOSIS — N2581 Secondary hyperparathyroidism of renal origin: Secondary | ICD-10-CM | POA: Diagnosis not present

## 2020-06-28 DIAGNOSIS — Z992 Dependence on renal dialysis: Secondary | ICD-10-CM | POA: Diagnosis not present

## 2020-06-28 DIAGNOSIS — R079 Chest pain, unspecified: Secondary | ICD-10-CM | POA: Diagnosis not present

## 2020-06-29 NOTE — Telephone Encounter (Signed)
Attempted to contact pt x 3. Unable to leave message as mailbox is not set up. Will remove from [facility] pool.

## 2020-06-30 DIAGNOSIS — N186 End stage renal disease: Secondary | ICD-10-CM | POA: Diagnosis not present

## 2020-06-30 DIAGNOSIS — D689 Coagulation defect, unspecified: Secondary | ICD-10-CM | POA: Diagnosis not present

## 2020-06-30 DIAGNOSIS — Z992 Dependence on renal dialysis: Secondary | ICD-10-CM | POA: Diagnosis not present

## 2020-06-30 DIAGNOSIS — D509 Iron deficiency anemia, unspecified: Secondary | ICD-10-CM | POA: Diagnosis not present

## 2020-06-30 DIAGNOSIS — D631 Anemia in chronic kidney disease: Secondary | ICD-10-CM | POA: Diagnosis not present

## 2020-06-30 DIAGNOSIS — N2581 Secondary hyperparathyroidism of renal origin: Secondary | ICD-10-CM | POA: Diagnosis not present

## 2020-07-02 DIAGNOSIS — D509 Iron deficiency anemia, unspecified: Secondary | ICD-10-CM | POA: Diagnosis not present

## 2020-07-02 DIAGNOSIS — N186 End stage renal disease: Secondary | ICD-10-CM | POA: Diagnosis not present

## 2020-07-02 DIAGNOSIS — Z992 Dependence on renal dialysis: Secondary | ICD-10-CM | POA: Diagnosis not present

## 2020-07-02 DIAGNOSIS — N2581 Secondary hyperparathyroidism of renal origin: Secondary | ICD-10-CM | POA: Diagnosis not present

## 2020-07-02 DIAGNOSIS — D631 Anemia in chronic kidney disease: Secondary | ICD-10-CM | POA: Diagnosis not present

## 2020-07-02 DIAGNOSIS — D689 Coagulation defect, unspecified: Secondary | ICD-10-CM | POA: Diagnosis not present

## 2020-07-02 NOTE — Progress Notes (Deleted)
Cardiology Office Note:    Date:  07/02/2020   ID:  Erin Dean, DOB [birth year], MRN [id number]  PCP:  Lois Huxley, PA  Cardiologist:  Skeet Latch, MD  Electrophysiologist:  None   Referring MD: Lois Huxley, [place]   Chief Complaint: hospital follow-up  History of Present Illness:    Erin Dean is a 55 y.o. female with a history of CAD s/p recent orbital atherectomy and DES to ostial LAD on 06/22/2020, paroxysmal atrial fibrillation on Eliquis, chronic diastolic CHF, CVA in [id number] and 2021 s/p loop recorder, poorly controlled hypertension, hyperlipidemia, ESRD on hemodialysis on T/Th/Sat, morbid obesity, and medication non-compliance who is followed by Dr. Oval Linsey and presents today for hospital follow-up.   Patient has had inconsistent follow-up with Cardiology in the past. She was last seen by Dr. Gwenlyn Found in 04/2017 after a hospitalization for a stroke. She was again loss to follow-up after this visit. She was admitted in 03/2020 with another stroke. She ended up testing positive for COVID. Echo at that time LVEF of 65-70% with possible mobile echodensity attached to the left coronary cusp. TEE was deferred to outpatient setting given COVID. Patient no showed follow-up visit.  Patient was recently admitted from 06/17/2020 to 06/23/2020 for chest pain in the setting of tachyarrhythmia. EMS initially thought tachyarrhythmia was SVT but interrogation of loop recorder revealed atrial fibrillation with RVR. This was a new diagnosis. Chest pain resolved after patient converted spontaneously converted to sinus rhythm en route to the ED via EMS. High-sensitivity troponin peaked at 367. Patient underwent Myoview which was abnormal. Therefore, cardiac catheterization was performed on 06/20/2020 and showed severe single vessel CAD with 95% stenosis of ostial/proximal LAD with heavy calcification and mild to moderate disease elsewhere. She was not felt to be a good candidate for CABG and therefore  was brought back to the cath lab on 06/22/2020 for orbital atherectomy and DES to LAD lesion. Initial plan was for TEE for further evaluation of possible mobile echodensity noted on Echo from last admission. However, patient refused general sedation due to bad experience in the past. Therefore, repeat TTE was performed and showed typical AVSC but no focal density that was worrisome for vegetation. Plan is for triple therapy with Aspirin, Plavix, and Eliquis for 1 month at which time Aspirin can be discontinued.  Patient presents today for follow-up. ***  CAD - Patient recently admitted with chest pain in setting of atrial fibrillation with RVR. She had abnormal Myoview so underwent cardiac catheterization which showed severe single vessel CAD. S/p orbital atherectomy and DES to ostial/proximal LAD. - *** - Plan is for triple therapy with Aspirin, Plavix, and Eliquis for 1 month at which time Aspirin can be stopped.  - Continue beta-blocker and high-intensity statin.  Paroxysmal Atrial Fibrillation - Newly diagnosed during recent admission.  - Maintaining sinus rhythm.  - Continue Coreg '25mg'$  twice daily. - Continue Eliquis '5mg'$  twice daily.  Chronic Diastolic CHF - Echo on recent admission showed LVEF of 60-65%. - Volume status managed with hemodialysis.  Hypertension - *** - Continue Amlodipine '10mg'$  daily and Coreg '25mg'$  twice daily.  Hyperlipidemia - Lipid panel in 03/2020: Total Cholesterol 144, Triglycerides 72, HDL 33, LDL 97.  - Continue Lipitor '80mg'$  daily (increased during admission in 03/2020). - Will repeat lipid panel and LFTs. ***  ESRD  - On hemodialysis on Tuesday/Thursday/Saturday. - Management per primary team.  Past Medical History:  Diagnosis Date  . ESRD (end stage renal disease) ([facility])  [facility]   . Hypertension   . Stroke ([place]) 04/2017   no residual . Limp left side  . SVT (supraventricular tachycardia) ([facility])     Past Surgical History:  Procedure  Laterality Date  . AV FISTULA PLACEMENT Right 08/16/2017   Procedure: CREATION OF RADIOCEPHALIC VERSUS BRACHIOCEPHALIC ARTERIOVENOUS FISTULA RIGHT ARM;  Surgeon: Conrad Blanford, MD;  Location: [place];  Service: Vascular;  Laterality: Right;  . AV FISTULA PLACEMENT Right 02/07/2018   Procedure: Creation Right arm Brachiocephalic Fistula;  Surgeon: Marty Heck, MD;  Location: [facility];  Service: Vascular;  Laterality: Right;  . AV FISTULA PLACEMENT Left 08/15/2018   Procedure: ARTERIOVENOUS (AV) FISTULA CREATION;  Surgeon: Serafina Mitchell, MD;  Location: [place];  Service: Vascular;  Laterality: Left;  . BASCILIC VEIN TRANSPOSITION Right 04/07/2018   Procedure: SECOND STAGE BASILIC VEIN TRANSPOSITION RIGHT ARM;  Surgeon: Marty Heck, MD;  Location: [place];  Service: Vascular;  Laterality: Right;  . CESAREAN SECTION    . CORONARY ATHERECTOMY N/A 06/22/2020   Procedure: CORONARY ATHERECTOMY;  Surgeon: Wellington Hampshire, MD;  Location: [place] [facility];  Service: Cardiovascular;  Laterality: N/A;  . CORONARY STENT INTERVENTION N/A 06/22/2020   Procedure: CORONARY STENT INTERVENTION;  Surgeon: Wellington Hampshire, MD;  Location: [place] [facility];  Service: Cardiovascular;  Laterality: N/A;  . ESOPHAGOGASTRODUODENOSCOPY N/A 04/19/2017   Procedure: ESOPHAGOGASTRODUODENOSCOPY (EGD);  Surgeon: Carol Ada, MD;  Location: [place];  Service: Endoscopy;  Laterality: N/A;  . FISTULA SUPERFICIALIZATION Right 10/01/2017   Procedure: FISTULA SUPERFICIALIZATION RIGHT ARM;  Surgeon: Conrad [place], MD;  Location: [place];  Service: Vascular;  Laterality: Right;  . FISTULA SUPERFICIALIZATION Left 10/08/2018   Procedure: FISTULA SUPERFICIALIZATION LEFT ARM;  Surgeon: Serafina Mitchell, MD;  Location: [place];  Service: Vascular;  Laterality: Left;  . INTRAVASCULAR IMAGING/OCT N/A 06/22/2020   Procedure: INTRAVASCULAR IMAGING/OCT;  Surgeon: Wellington Hampshire, MD;  Location: [place] [facility];  Service:  Cardiovascular;  Laterality: N/A;  . IR FLUORO GUIDE CV LINE RIGHT  04/23/2018  . IR US GUIDE VASC ACCESS RIGHT  04/23/2018  . LEFT HEART CATH AND CORONARY ANGIOGRAPHY N/A 06/20/2020   Procedure: LEFT HEART CATH AND CORONARY ANGIOGRAPHY;  Surgeon: Nelva Bush, MD;  Location: [place] [facility];  Service: Cardiovascular;  Laterality: N/A;  . LOOP RECORDER INSERTION N/A 04/22/2017   Procedure: LOOP RECORDER INSERTION;  Surgeon: Constance Haw, MD;  Location: [place] [facility];  Service: Cardiovascular;  Laterality: N/A;  . RADIOLOGY WITH ANESTHESIA N/A 04/01/2020   Procedure: MRI W/O CONSTRAST  WITH ANESTHESIA;  Surgeon: Radiologist, Medication, MD;  Location: [place];  Service: Radiology;  Laterality: N/A;  . TEE WITHOUT CARDIOVERSION N/A 04/19/2017   Procedure: TRANSESOPHAGEAL ECHOCARDIOGRAM (TEE);  Surgeon: Carol Ada, MD;  Location: [place];  Service: Endoscopy;  Laterality: N/A;    Current Medications: No outpatient medications have been marked as taking for the 07/08/20 encounter (Appointment) with Darreld Mclean, PA-C.     Allergies:   Chlorhexidine gluconate   Social History   Socioeconomic History  . Marital status: Legally Separated    Spouse name: Not on file  . Number of children: Not on file  . Years of education: Not on file  . Highest education level: Not on file  Occupational History  . Not on file  Tobacco Use  . Smoking status: Never Smoker  . Smokeless tobacco: Never Used  Vaping Use  . Vaping Use:  Never used  Substance and Sexual Activity  . Alcohol use: No  . Drug use: No  . Sexual activity: Not on file  Other Topics Concern  . Not on file  Social History Narrative   Lives with daughter   Education- high school   On temp disability   Social Determinants of Health   Financial Resource Strain: Not on file  Food Insecurity: Not on file  Transportation Needs: Not on file  Physical Activity: Not on file  Stress: Not on file  Social  Connections: Not on file     Family History: The patient's ***family history includes Cancer in her father; Heart attack in her maternal grandfather and maternal uncle; Hypertension in her sister; Stroke in her paternal grandmother.  ROS:   Please see the history of present illness.    *** All other systems reviewed and are negative.  EKGs/Labs/Other Studies Reviewed:    The following studies were reviewed today:  Echocardiogram 06/19/2020: Impressions: 1. The aortic valve is tricuspid and the leaflets are moderately  calcified. There is trivial aortic regurgitation. There is no apparent  vegetation on the current study. It appears to just be calcium on the  valve. Additionally, there is no significant  regurgitation, which goes against a destructive lesion such as  endocarditis. If there are clinical concerns for endocarditis, would  recommend a TEE. The aortic valve is tricuspid. There is moderate  calcification of the aortic valve. There is moderate  thickening of the aortic valve. Aortic valve regurgitation is trivial.  Mild to moderate aortic valve sclerosis/calcification is present, without  any evidence of aortic stenosis.  2. Left ventricular ejection fraction, by estimation, is 60 to 65%. The  left ventricle has normal function. The left ventricle has no regional  wall motion abnormalities.  3. A small pericardial effusion is present. The pericardial effusion is  circumferential.  4. The mitral valve is degenerative. Mild mitral valve regurgitation. No  evidence of mitral stenosis.  5. There is mildly elevated pulmonary artery systolic pressure. _______________  Left Cardiac Catheterization 06/20/2020: Conclusions: 1. Severe single-vessel coronary artery disease with 95% ostial/proximal LAD stenosis with heavy calcification.  Mild to moderate CAD also noted in mid/distal LAD, D1, ramus, intermedius, large OM1, codominant LCx, and RCA. 2. Normal left ventricular  filling pressure.  Recommendations: 1. Images reviewed with interventional team and Dr. Radford Pax.  Given multiple comorbitities, including recurrent CVA and ESRD, patient is not a good candidate for CABG.  Additionally, she has refused procedures necessitating general anesthesia.  We agree that the most favorable plan would be to proceed with atherectomy/PCI of the ostial/proximal LAD. 2. Stop ticagrelor after tonight's dose and load with clopidogrel 300 mg x 1 tomorrow morning, followed by 75 mg daily thereafter.  Anticipate discharge on apixaban and clopidogrel. 3. Restart IV heparin in the setting of paroxysmal atrial fibrillation and NSTEMI 8 hours after right femoral artery sheath removed. 4. Remove right femoral artery sheath with manual compression. 5. Aggressive secondary prevention. _______________  Coronary Stent Intervention 06/22/2020:  Dist LM lesion is 15% stenosed.  1st Diag lesion is 70% stenosed.  Mid LAD lesion is 50% stenosed.  Ost LAD to Prox LAD lesion is 95% stenosed.  Dist LAD lesion is 30% stenosed.  1st Mrg lesion is 30% stenosed.  Dist Cx lesion is 60% stenosed with 60% stenosed side branch in LPAV.  Ramus lesion is 60% stenosed.  Post intervention, there is a 0% residual stenosis.  A drug-eluting stent was successfully  placed using a STENT RESOLUTE ONYX 3.5X15.   Successful OCT guided orbital atherectomy and drug-eluting stent placement to the ostial LAD.  OCT images were overall suboptimal due to high flow state and difficulty clearing blood with contrast.  Recommendations: Dual antiplatelet therapy with aspirin and clopidogrel.  Aspirin can be discontinued after 1 week to minimize the risk of bleeding given that she is on anticoagulation. Anticoagulation can be resumed tomorrow if no bleeding issues from the right femoral artery.  EKG:  EKG ordered today. EKG personally reviewed and demonstrates ***.  06/17/2020: ALT 11 06/18/2020: TSH  1.148 06/21/2020: Magnesium 2.2 06/23/2020: BUN 26; Creatinine, Ser 8.93; Hemoglobin 8.8; Platelets 217; Potassium 5.2; Sodium 134  Recent Lipid Panel    Component Value Date/Time   CHOL 144 04/01/2020 0115   TRIG 72 04/01/2020 0115   HDL 33 (L) 04/01/2020 0115   CHOLHDL 4.4 04/01/2020 0115   VLDL 14 04/01/2020 0115   LDLCALC 97 04/01/2020 0115    Physical Exam:    Vital Signs: LMP 08/12/2017     Wt Readings from Last 3 Encounters:  06/23/20 244 lb 14.9 oz (111.1 kg)  04/02/20 251 lb 1.7 oz (113.9 kg)  12/01/18 255 lb (115.7 kg)     General: 55 y.o. female in no acute distress. HEENT: Normocephalic and atraumatic. Sclera clear. EOMs intact. Neck: Supple. No carotid bruits. No JVD. Heart: *** RRR. Distinct S1 and S2. No murmurs, gallops, or rubs. Radial and distal pedal pulses 2+ and equal bilaterally. Lungs: No increased work of breathing. Clear to ausculation bilaterally. No wheezes, rhonchi, or rales.  Abdomen: Soft, non-distended, and non-tender to palpation. Bowel sounds present in all 4 quadrants.  MSK: Normal strength and tone for age. *** Extremities: No lower extremity edema.    Skin: Warm and dry. Neuro: Alert and oriented x3. No focal deficits. Psych: Normal affect. Responds appropriately.   Assessment:    No diagnosis found.  Plan:     Disposition: Follow up in ***   Medication Adjustments/Labs and Tests Ordered: Current medicines are reviewed at length with the patient today.  Concerns regarding medicines are outlined above.  No orders of the defined types were placed in this encounter.  No orders of the defined types were placed in this encounter.   There are no Patient Instructions on file for this visit.   Signed, Darreld Mclean, PA-C  07/02/2020 10:36 AM    [facility]

## 2020-07-05 DIAGNOSIS — D689 Coagulation defect, unspecified: Secondary | ICD-10-CM | POA: Diagnosis not present

## 2020-07-05 DIAGNOSIS — D509 Iron deficiency anemia, unspecified: Secondary | ICD-10-CM | POA: Diagnosis not present

## 2020-07-05 DIAGNOSIS — N186 End stage renal disease: Secondary | ICD-10-CM | POA: Diagnosis not present

## 2020-07-05 DIAGNOSIS — Z992 Dependence on renal dialysis: Secondary | ICD-10-CM | POA: Diagnosis not present

## 2020-07-05 DIAGNOSIS — N2581 Secondary hyperparathyroidism of renal origin: Secondary | ICD-10-CM | POA: Diagnosis not present

## 2020-07-07 DIAGNOSIS — Z992 Dependence on renal dialysis: Secondary | ICD-10-CM | POA: Diagnosis not present

## 2020-07-07 DIAGNOSIS — I129 Hypertensive chronic kidney disease with stage 1 through stage 4 chronic kidney disease, or unspecified chronic kidney disease: Secondary | ICD-10-CM | POA: Diagnosis not present

## 2020-07-07 DIAGNOSIS — N186 End stage renal disease: Secondary | ICD-10-CM | POA: Diagnosis not present

## 2020-07-08 ENCOUNTER — Ambulatory Visit: Payer: Medicare Other | Admitting: Student

## 2020-07-09 DIAGNOSIS — D509 Iron deficiency anemia, unspecified: Secondary | ICD-10-CM | POA: Diagnosis not present

## 2020-07-09 DIAGNOSIS — D689 Coagulation defect, unspecified: Secondary | ICD-10-CM | POA: Diagnosis not present

## 2020-07-09 DIAGNOSIS — R52 Pain, unspecified: Secondary | ICD-10-CM | POA: Diagnosis not present

## 2020-07-09 DIAGNOSIS — N2581 Secondary hyperparathyroidism of renal origin: Secondary | ICD-10-CM | POA: Diagnosis not present

## 2020-07-09 DIAGNOSIS — Z992 Dependence on renal dialysis: Secondary | ICD-10-CM | POA: Diagnosis not present

## 2020-07-09 DIAGNOSIS — N186 End stage renal disease: Secondary | ICD-10-CM | POA: Diagnosis not present

## 2020-07-12 DIAGNOSIS — Z992 Dependence on renal dialysis: Secondary | ICD-10-CM | POA: Diagnosis not present

## 2020-07-12 DIAGNOSIS — N186 End stage renal disease: Secondary | ICD-10-CM | POA: Diagnosis not present

## 2020-07-12 DIAGNOSIS — D689 Coagulation defect, unspecified: Secondary | ICD-10-CM | POA: Diagnosis not present

## 2020-07-12 DIAGNOSIS — N2581 Secondary hyperparathyroidism of renal origin: Secondary | ICD-10-CM | POA: Diagnosis not present

## 2020-07-12 DIAGNOSIS — D509 Iron deficiency anemia, unspecified: Secondary | ICD-10-CM | POA: Diagnosis not present

## 2020-07-12 DIAGNOSIS — D631 Anemia in chronic kidney disease: Secondary | ICD-10-CM | POA: Diagnosis not present

## 2020-07-14 DIAGNOSIS — N2581 Secondary hyperparathyroidism of renal origin: Secondary | ICD-10-CM | POA: Diagnosis not present

## 2020-07-14 DIAGNOSIS — D631 Anemia in chronic kidney disease: Secondary | ICD-10-CM | POA: Diagnosis not present

## 2020-07-14 DIAGNOSIS — D509 Iron deficiency anemia, unspecified: Secondary | ICD-10-CM | POA: Diagnosis not present

## 2020-07-14 DIAGNOSIS — Z992 Dependence on renal dialysis: Secondary | ICD-10-CM | POA: Diagnosis not present

## 2020-07-14 DIAGNOSIS — N186 End stage renal disease: Secondary | ICD-10-CM | POA: Diagnosis not present

## 2020-07-14 DIAGNOSIS — D689 Coagulation defect, unspecified: Secondary | ICD-10-CM | POA: Diagnosis not present

## 2020-07-16 DIAGNOSIS — D631 Anemia in chronic kidney disease: Secondary | ICD-10-CM | POA: Diagnosis not present

## 2020-07-16 DIAGNOSIS — N2581 Secondary hyperparathyroidism of renal origin: Secondary | ICD-10-CM | POA: Diagnosis not present

## 2020-07-16 DIAGNOSIS — D689 Coagulation defect, unspecified: Secondary | ICD-10-CM | POA: Diagnosis not present

## 2020-07-16 DIAGNOSIS — N186 End stage renal disease: Secondary | ICD-10-CM | POA: Diagnosis not present

## 2020-07-16 DIAGNOSIS — Z992 Dependence on renal dialysis: Secondary | ICD-10-CM | POA: Diagnosis not present

## 2020-07-16 DIAGNOSIS — D509 Iron deficiency anemia, unspecified: Secondary | ICD-10-CM | POA: Diagnosis not present

## 2020-07-19 DIAGNOSIS — D689 Coagulation defect, unspecified: Secondary | ICD-10-CM | POA: Diagnosis not present

## 2020-07-19 DIAGNOSIS — N2581 Secondary hyperparathyroidism of renal origin: Secondary | ICD-10-CM | POA: Diagnosis not present

## 2020-07-19 DIAGNOSIS — Z992 Dependence on renal dialysis: Secondary | ICD-10-CM | POA: Diagnosis not present

## 2020-07-19 DIAGNOSIS — N186 End stage renal disease: Secondary | ICD-10-CM | POA: Diagnosis not present

## 2020-07-19 DIAGNOSIS — R52 Pain, unspecified: Secondary | ICD-10-CM | POA: Diagnosis not present

## 2020-07-21 DIAGNOSIS — N186 End stage renal disease: Secondary | ICD-10-CM | POA: Diagnosis not present

## 2020-07-21 DIAGNOSIS — Z992 Dependence on renal dialysis: Secondary | ICD-10-CM | POA: Diagnosis not present

## 2020-07-21 DIAGNOSIS — R52 Pain, unspecified: Secondary | ICD-10-CM | POA: Diagnosis not present

## 2020-07-21 DIAGNOSIS — D689 Coagulation defect, unspecified: Secondary | ICD-10-CM | POA: Diagnosis not present

## 2020-07-21 DIAGNOSIS — N2581 Secondary hyperparathyroidism of renal origin: Secondary | ICD-10-CM | POA: Diagnosis not present

## 2020-07-23 DIAGNOSIS — Z992 Dependence on renal dialysis: Secondary | ICD-10-CM | POA: Diagnosis not present

## 2020-07-23 DIAGNOSIS — N2581 Secondary hyperparathyroidism of renal origin: Secondary | ICD-10-CM | POA: Diagnosis not present

## 2020-07-23 DIAGNOSIS — R52 Pain, unspecified: Secondary | ICD-10-CM | POA: Diagnosis not present

## 2020-07-23 DIAGNOSIS — D689 Coagulation defect, unspecified: Secondary | ICD-10-CM | POA: Diagnosis not present

## 2020-07-23 DIAGNOSIS — N186 End stage renal disease: Secondary | ICD-10-CM | POA: Diagnosis not present

## 2020-07-26 DIAGNOSIS — N2581 Secondary hyperparathyroidism of renal origin: Secondary | ICD-10-CM | POA: Diagnosis not present

## 2020-07-26 DIAGNOSIS — Z992 Dependence on renal dialysis: Secondary | ICD-10-CM | POA: Diagnosis not present

## 2020-07-26 DIAGNOSIS — D631 Anemia in chronic kidney disease: Secondary | ICD-10-CM | POA: Diagnosis not present

## 2020-07-26 DIAGNOSIS — D509 Iron deficiency anemia, unspecified: Secondary | ICD-10-CM | POA: Diagnosis not present

## 2020-07-26 DIAGNOSIS — D689 Coagulation defect, unspecified: Secondary | ICD-10-CM | POA: Diagnosis not present

## 2020-07-26 DIAGNOSIS — N186 End stage renal disease: Secondary | ICD-10-CM | POA: Diagnosis not present

## 2020-07-28 DIAGNOSIS — Z992 Dependence on renal dialysis: Secondary | ICD-10-CM | POA: Diagnosis not present

## 2020-07-28 DIAGNOSIS — D689 Coagulation defect, unspecified: Secondary | ICD-10-CM | POA: Diagnosis not present

## 2020-07-28 DIAGNOSIS — N2581 Secondary hyperparathyroidism of renal origin: Secondary | ICD-10-CM | POA: Diagnosis not present

## 2020-07-28 DIAGNOSIS — D509 Iron deficiency anemia, unspecified: Secondary | ICD-10-CM | POA: Diagnosis not present

## 2020-07-28 DIAGNOSIS — N186 End stage renal disease: Secondary | ICD-10-CM | POA: Diagnosis not present

## 2020-07-28 DIAGNOSIS — D631 Anemia in chronic kidney disease: Secondary | ICD-10-CM | POA: Diagnosis not present

## 2020-08-02 DIAGNOSIS — Z992 Dependence on renal dialysis: Secondary | ICD-10-CM | POA: Diagnosis not present

## 2020-08-02 DIAGNOSIS — D689 Coagulation defect, unspecified: Secondary | ICD-10-CM | POA: Diagnosis not present

## 2020-08-02 DIAGNOSIS — D509 Iron deficiency anemia, unspecified: Secondary | ICD-10-CM | POA: Diagnosis not present

## 2020-08-02 DIAGNOSIS — N2581 Secondary hyperparathyroidism of renal origin: Secondary | ICD-10-CM | POA: Diagnosis not present

## 2020-08-02 DIAGNOSIS — N186 End stage renal disease: Secondary | ICD-10-CM | POA: Diagnosis not present

## 2020-08-02 NOTE — Progress Notes (Deleted)
Cardiology Office Note:    Date:  08/02/2020   ID:  Erin Dean, DOB 08/15/1965, MRN [id number]  PCP:  Lois Huxley, PA  Cardiologist:  Skeet Latch, MD  Electrophysiologist:  None   Referring MD: Lois Huxley, [place]   Chief Complaint: hospital follow-up for atrial fibrillation and chest pain  History of Present Illness:    Erin Dean is a 55 y.o. female with a history of CAD s/p recent orbital atherectomy and DES to ostial LAD on 06/22/2020, paroxysmal atrial fibrillation on Eliquis, chronic diastolic CHF, CVA in Erin Dean and 2021 s/p loop recorder, poorly controlled hypertension, hyperlipidemia, ESRD on hemodialysis on T/Th/Sat, morbid obesity, and medication non-compliance who is followed by Dr. Oval Linsey and presents today for hospital follow-up.   Patient has had inconsistent follow-up with Cardiology in the past. She was last seen by Dr. Gwenlyn Found in 04/2017 after a hospitalization for a stroke. She was again loss to follow-up after this visit. She was admitted in 03/2020 with another stroke. She ended up testing positive for COVID. Echo at that time LVEF of 65-70% with possible mobile echodensity attached to the left coronary cusp. TEE was deferred to outpatient setting given COVID. Patient no showed follow-up visit.  Patient was recently admitted from 06/17/2020 to 06/23/2020 for chest pain in the setting of tachyarrhythmia. EMS initially thought tachyarrhythmia was SVT but interrogation of loop recorder revealed atrial fibrillation with RVR. This was a new diagnosis. Chest pain resolved after patient converted spontaneously converted to sinus rhythm en route to the ED via EMS. High-sensitivity troponin peaked at 367. Patient underwent Myoview which was abnormal. Therefore, cardiac catheterization was performed on 06/20/2020 and showed severe single vessel CAD with 95% stenosis of ostial/proximal LAD with heavy calcification and mild to moderate disease elsewhere. She was not felt to be  a good candidate for CABG and therefore was brought back to the cath lab on 06/22/2020 for orbital atherectomy and DES to LAD lesion. Initial plan was for TEE for further evaluation of possible mobile echodensity noted on Echo from last admission. However, patient refused general sedation due to bad experience in the past. Therefore, repeat TTE was performed and showed typical AVSC but no focal density that was worrisome for vegetation. Plan is for triple therapy with Aspirin, Plavix, and Eliquis for 1 month at which time Aspirin can be discontinued.  Patient presents today for follow-up. ***  CAD - Patient recently admitted with chest pain in setting of atrial fibrillation with RVR. She had abnormal Myoview so underwent cardiac catheterization which showed severe single vessel CAD. S/p orbital atherectomy and DES to ostial/proximal LAD. - *** - Plan is for triple therapy with Aspirin, Plavix, and Eliquis for 1 month at which time Aspirin can be stopped.  *** - Continue beta-blocker and high-intensity statin.  Paroxysmal Atrial Fibrillation - Newly diagnosed during recent admission.  - Maintaining sinus rhythm.  - Continue Coreg '25mg'$  twice daily. - Continue Eliquis '5mg'$  twice daily.  Chronic Diastolic CHF - Echo on recent admission showed LVEF of 60-65%. - Volume status managed with hemodialysis.  Hypertension - *** - Continue Amlodipine '10mg'$  daily and Coreg '25mg'$  twice daily.  Hyperlipidemia - Lipid panel in 03/2020: Total Cholesterol 144, Triglycerides 72, HDL 33, LDL 97.  - Continue Lipitor '80mg'$  daily (increased during admission in 03/2020). - Will repeat lipid panel and LFTs. ***  ESRD  - On hemodialysis on Tuesday/Thursday/Saturday. - Management per primary team.   Past Medical History:  Diagnosis Date  .  ESRD (end stage renal disease) (Erin Dean)    [facility] [place]   . Hypertension   . Stroke ([place]) 04/2017   no residual . Limp left side  . SVT (supraventricular tachycardia) ([facility])      Past Surgical History:  Procedure Laterality Date  . AV FISTULA PLACEMENT Right 08/16/2017   Procedure: CREATION OF RADIOCEPHALIC VERSUS BRACHIOCEPHALIC ARTERIOVENOUS FISTULA RIGHT ARM;  Surgeon: Conrad Vega Baja, MD;  Location: [place];  Service: Vascular;  Laterality: Right;  . AV FISTULA PLACEMENT Right 02/07/2018   Procedure: Creation Right arm Brachiocephalic Fistula;  Surgeon: Marty Heck, MD;  [facility];  Service: Vascular;  Laterality: Right;  . AV FISTULA PLACEMENT Left 08/15/2018   Procedure: ARTERIOVENOUS (AV) FISTULA CREATION;  Surgeon: Serafina Mitchell, MD;  Location: [place];  Service: Vascular;  Laterality: Left;  . BASCILIC VEIN TRANSPOSITION Right 04/07/2018   Procedure: SECOND STAGE BASILIC VEIN TRANSPOSITION RIGHT ARM;  Surgeon: Marty Heck, MD;  Location: [place];  Service: Vascular;  Laterality: Right;  . CESAREAN SECTION    . CORONARY ATHERECTOMY N/A 06/22/2020   Procedure: CORONARY ATHERECTOMY;  Surgeon: Wellington Hampshire, MD;  Location: [place] [facility];  Service: Cardiovascular;  Laterality: N/A;  . CORONARY STENT INTERVENTION N/A 06/22/2020   Procedure: CORONARY STENT INTERVENTION;  Surgeon: Wellington Hampshire, MD;  Location: [place] [facility];  Service: Cardiovascular;  Laterality: N/A;  . ESOPHAGOGASTRODUODENOSCOPY N/A 04/19/2017   Procedure: ESOPHAGOGASTRODUODENOSCOPY (EGD);  Surgeon: Carol Ada, MD;  Location: [place];  Service: Endoscopy;  Laterality: N/A;  . FISTULA SUPERFICIALIZATION Right 10/01/2017   Procedure: FISTULA SUPERFICIALIZATION RIGHT ARM;  Surgeon: Conrad Allenville, MD;  Location: [place];  Service: Vascular;  Laterality: Right;  . FISTULA SUPERFICIALIZATION Left 10/08/2018   Procedure: FISTULA SUPERFICIALIZATION LEFT ARM;  Surgeon: Serafina Mitchell, MD;  Location: [place];  Service: Vascular;  Laterality: Left;  . INTRAVASCULAR IMAGING/OCT N/A 06/22/2020   Procedure: INTRAVASCULAR IMAGING/OCT;  Surgeon: Wellington Hampshire, MD;  Location:  [place] [facility];  Service: Cardiovascular;  Laterality: N/A;  . IR FLUORO GUIDE CV LINE RIGHT  04/23/2018  . IR US GUIDE VASC ACCESS RIGHT  04/23/2018  . LEFT HEART CATH AND CORONARY ANGIOGRAPHY N/A 06/20/2020   Procedure: LEFT HEART CATH AND CORONARY ANGIOGRAPHY;  Surgeon: Nelva Bush, MD;  Location: [place] [facility];  Service: Cardiovascular;  Laterality: N/A;  . LOOP RECORDER INSERTION N/A 04/22/2017   Procedure: LOOP RECORDER INSERTION;  Surgeon: Constance Haw, MD;  Location: [place] [facility];  Service: Cardiovascular;  Laterality: N/A;  . RADIOLOGY WITH ANESTHESIA N/A 04/01/2020   Procedure: MRI W/O CONSTRAST  WITH ANESTHESIA;  Surgeon: Radiologist, Medication, MD;  Location: [place];  Service: Radiology;  Laterality: N/A;  . TEE WITHOUT CARDIOVERSION N/A 04/19/2017   Procedure: TRANSESOPHAGEAL ECHOCARDIOGRAM (TEE);  Surgeon: Carol Ada, MD;  Location: [place];  Service: Endoscopy;  Laterality: N/A;    Current Medications: No outpatient medications have been marked as taking for the 08/11/20 encounter (Appointment) with Darreld Mclean, PA-C.     Allergies:   Chlorhexidine gluconate   Social History   Socioeconomic History  . Marital status: Legally Separated    Spouse name: Not on file  . Number of children: Not on file  . Years of education: Not on file  . Highest education level: Not on file  Occupational History  . Not on file  Tobacco Use  . Smoking status: Never Smoker  . Smokeless tobacco:  Never Used  Vaping Use  . Vaping Use: Never used  Substance and Sexual Activity  . Alcohol use: No  . Drug use: No  . Sexual activity: Not on file  Other Topics Concern  . Not on file  Social History Narrative   Lives with daughter   Education- high school   On temp disability   Social Determinants of Health   Financial Resource Strain: Not on file  Food Insecurity: Not on file  Transportation Needs: Not on file  Physical Activity: Not on file   Stress: Not on file  Social Connections: Not on file     Family History: The patient's family history includes Cancer in her father; Heart attack in her maternal grandfather and maternal uncle; Hypertension in her sister; Stroke in her paternal grandmother.  ROS:   Please see the history of present illness.     EKGs/Labs/Other Studies Reviewed:    The following studies were reviewed today:  Echocardiogram 06/19/2020: Impressions: 1. The aortic valve is tricuspid and the leaflets are moderately  calcified. There is trivial aortic regurgitation. There is no apparent  vegetation on the current study. It appears to just be calcium on the  valve. Additionally, there is no significant  regurgitation, which goes against a destructive lesion such as  endocarditis. If there are clinical concerns for endocarditis, would  recommend a TEE. The aortic valve is tricuspid. There is moderate  calcification of the aortic valve. There is moderate  thickening of the aortic valve. Aortic valve regurgitation is trivial.  Mild to moderate aortic valve sclerosis/calcification is present, without  any evidence of aortic stenosis.  2. Left ventricular ejection fraction, by estimation, is 60 to 65%. The  left ventricle has normal function. The left ventricle has no regional  wall motion abnormalities.  3. A small pericardial effusion is present. The pericardial effusion is  circumferential.  4. The mitral valve is degenerative. Mild mitral valve regurgitation. No  evidence of mitral stenosis.  5. There is mildly elevated pulmonary artery systolic pressure.  _______________  Left Cardiac Catheterization 06/20/2020: Conclusions: 1. Severe single-vessel coronary artery disease with 95% ostial/proximal LAD stenosis with heavy calcification.  Mild to moderate CAD also noted in mid/distal LAD, D1, ramus, intermedius, large OM1, codominant LCx, and RCA. 2. Normal left ventricular filling  pressure.  Recommendations: 1. Images reviewed with interventional team and Dr. Radford Pax.  Given multiple comorbitities, including recurrent CVA and ESRD, patient is not a good candidate for CABG.  Additionally, she has refused procedures necessitating general anesthesia.  We agree that the most favorable plan would be to proceed with atherectomy/PCI of the ostial/proximal LAD. 2. Stop ticagrelor after tonight's dose and load with clopidogrel 300 mg x 1 tomorrow morning, followed by 75 mg daily thereafter.  Anticipate discharge on apixaban and clopidogrel. 3. Restart IV heparin in the setting of paroxysmal atrial fibrillation and NSTEMI 8 hours after right femoral artery sheath removed. 4. Remove right femoral artery sheath with manual compression. 5. Aggressive secondary prevention. _______________  Coronary Stent Intervention 06/22/2020:  Dist LM lesion is 15% stenosed.  1st Diag lesion is 70% stenosed.  Mid LAD lesion is 50% stenosed.  Ost LAD to Prox LAD lesion is 95% stenosed.  Dist LAD lesion is 30% stenosed.  1st Mrg lesion is 30% stenosed.  Dist Cx lesion is 60% stenosed with 60% stenosed side branch in LPAV.  Ramus lesion is 60% stenosed.  Post intervention, there is a 0% residual stenosis.  A drug-eluting stent  was successfully placed using a STENT RESOLUTE ONYX 3.5X15.   Successful OCT guided orbital atherectomy and drug-eluting stent placement to the ostial LAD.  OCT images were overall suboptimal due to high flow state and difficulty clearing blood with contrast.  Recommendations: Dual antiplatelet therapy with aspirin and clopidogrel.  Aspirin can be discontinued after 1 week to minimize the risk of bleeding given that she is on anticoagulation. Anticoagulation can be resumed tomorrow if no bleeding issues from the right femoral artery.  Diagnostic Dominance: Co-dominant    Intervention        EKG:  EKG ordered today. EKG personally reviewed and  demonstrates ***.  Recent Labs: 06/17/2020: ALT 11 06/18/2020: TSH 1.148 06/21/2020: Magnesium 2.2 06/23/2020: BUN 26; Creatinine, Ser 8.93; Hemoglobin 8.8; Platelets 217; Potassium 5.2; Sodium 134  Recent Lipid Panel    Component Value Date/Time   CHOL 144 04/01/2020 0115   TRIG 72 04/01/2020 0115   HDL 33 (L) 04/01/2020 0115   CHOLHDL 4.4 04/01/2020 0115   VLDL 14 04/01/2020 0115   LDLCALC 97 04/01/2020 0115    Physical Exam:    Vital Signs: LMP 08/12/2017     Wt Readings from Last 3 Encounters:  06/23/20 244 lb 14.9 oz (111.1 kg)  04/02/20 251 lb 1.7 oz (113.9 kg)  12/01/18 255 lb (115.7 kg)     General: 55 y.o. female in no acute distress. HEENT: Normocephalic and atraumatic. Sclera clear. EOMs intact. Neck: Supple. No carotid bruits. No JVD. Heart: *** RRR. Distinct S1 and S2. No murmurs, gallops, or rubs. Radial and distal pedal pulses 2+ and equal bilaterally. Lungs: No increased work of breathing. Clear to ausculation bilaterally. No wheezes, rhonchi, or rales.  Abdomen: Soft, non-distended, and non-tender to palpation. Bowel sounds present in all 4 quadrants.  MSK: Normal strength and tone for age. *** Extremities: No lower extremity edema.    Skin: Warm and dry. Neuro: Alert and oriented x3. No focal deficits. Psych: Normal affect. Responds appropriately.   Assessment:    No diagnosis found.  Plan:     Disposition: Follow up in ***   Medication Adjustments/Labs and Tests Ordered: Current medicines are reviewed at length with the patient today.  Concerns regarding medicines are outlined above.  No orders of the defined types were placed in this encounter.  No orders of the defined types were placed in this encounter.   There are no Patient Instructions on file for this visit.   Signed, Darreld Mclean, PA-C  08/02/2020 4:51 PM    [facility]

## 2020-08-04 DIAGNOSIS — N186 End stage renal disease: Secondary | ICD-10-CM | POA: Diagnosis not present

## 2020-08-04 DIAGNOSIS — N2581 Secondary hyperparathyroidism of renal origin: Secondary | ICD-10-CM | POA: Diagnosis not present

## 2020-08-04 DIAGNOSIS — D689 Coagulation defect, unspecified: Secondary | ICD-10-CM | POA: Diagnosis not present

## 2020-08-04 DIAGNOSIS — Z992 Dependence on renal dialysis: Secondary | ICD-10-CM | POA: Diagnosis not present

## 2020-08-04 DIAGNOSIS — D509 Iron deficiency anemia, unspecified: Secondary | ICD-10-CM | POA: Diagnosis not present

## 2020-08-06 DIAGNOSIS — D509 Iron deficiency anemia, unspecified: Secondary | ICD-10-CM | POA: Diagnosis not present

## 2020-08-06 DIAGNOSIS — N2581 Secondary hyperparathyroidism of renal origin: Secondary | ICD-10-CM | POA: Diagnosis not present

## 2020-08-06 DIAGNOSIS — I129 Hypertensive chronic kidney disease with stage 1 through stage 4 chronic kidney disease, or unspecified chronic kidney disease: Secondary | ICD-10-CM | POA: Diagnosis not present

## 2020-08-06 DIAGNOSIS — Z992 Dependence on renal dialysis: Secondary | ICD-10-CM | POA: Diagnosis not present

## 2020-08-06 DIAGNOSIS — D689 Coagulation defect, unspecified: Secondary | ICD-10-CM | POA: Diagnosis not present

## 2020-08-06 DIAGNOSIS — N186 End stage renal disease: Secondary | ICD-10-CM | POA: Diagnosis not present

## 2020-08-09 DIAGNOSIS — I770 Arteriovenous fistula, acquired: Secondary | ICD-10-CM | POA: Diagnosis not present

## 2020-08-09 DIAGNOSIS — R52 Pain, unspecified: Secondary | ICD-10-CM | POA: Diagnosis not present

## 2020-08-09 DIAGNOSIS — N2581 Secondary hyperparathyroidism of renal origin: Secondary | ICD-10-CM | POA: Diagnosis not present

## 2020-08-09 DIAGNOSIS — Z992 Dependence on renal dialysis: Secondary | ICD-10-CM | POA: Diagnosis not present

## 2020-08-09 DIAGNOSIS — D631 Anemia in chronic kidney disease: Secondary | ICD-10-CM | POA: Diagnosis not present

## 2020-08-09 DIAGNOSIS — D689 Coagulation defect, unspecified: Secondary | ICD-10-CM | POA: Diagnosis not present

## 2020-08-09 DIAGNOSIS — N186 End stage renal disease: Secondary | ICD-10-CM | POA: Diagnosis not present

## 2020-08-11 ENCOUNTER — Ambulatory Visit: Payer: Medicare Other | Admitting: Student

## 2020-08-11 DIAGNOSIS — N2581 Secondary hyperparathyroidism of renal origin: Secondary | ICD-10-CM | POA: Diagnosis not present

## 2020-08-11 DIAGNOSIS — I770 Arteriovenous fistula, acquired: Secondary | ICD-10-CM | POA: Diagnosis not present

## 2020-08-11 DIAGNOSIS — Z992 Dependence on renal dialysis: Secondary | ICD-10-CM | POA: Diagnosis not present

## 2020-08-11 DIAGNOSIS — R52 Pain, unspecified: Secondary | ICD-10-CM | POA: Diagnosis not present

## 2020-08-11 DIAGNOSIS — D689 Coagulation defect, unspecified: Secondary | ICD-10-CM | POA: Diagnosis not present

## 2020-08-11 DIAGNOSIS — D631 Anemia in chronic kidney disease: Secondary | ICD-10-CM | POA: Diagnosis not present

## 2020-08-11 DIAGNOSIS — N186 End stage renal disease: Secondary | ICD-10-CM | POA: Diagnosis not present

## 2020-08-13 DIAGNOSIS — I770 Arteriovenous fistula, acquired: Secondary | ICD-10-CM | POA: Diagnosis not present

## 2020-08-13 DIAGNOSIS — D631 Anemia in chronic kidney disease: Secondary | ICD-10-CM | POA: Diagnosis not present

## 2020-08-13 DIAGNOSIS — N186 End stage renal disease: Secondary | ICD-10-CM | POA: Diagnosis not present

## 2020-08-13 DIAGNOSIS — N2581 Secondary hyperparathyroidism of renal origin: Secondary | ICD-10-CM | POA: Diagnosis not present

## 2020-08-13 DIAGNOSIS — D689 Coagulation defect, unspecified: Secondary | ICD-10-CM | POA: Diagnosis not present

## 2020-08-13 DIAGNOSIS — R52 Pain, unspecified: Secondary | ICD-10-CM | POA: Diagnosis not present

## 2020-08-13 DIAGNOSIS — Z992 Dependence on renal dialysis: Secondary | ICD-10-CM | POA: Diagnosis not present

## 2020-08-16 DIAGNOSIS — N186 End stage renal disease: Secondary | ICD-10-CM | POA: Diagnosis not present

## 2020-08-16 DIAGNOSIS — I770 Arteriovenous fistula, acquired: Secondary | ICD-10-CM | POA: Diagnosis not present

## 2020-08-16 DIAGNOSIS — D689 Coagulation defect, unspecified: Secondary | ICD-10-CM | POA: Diagnosis not present

## 2020-08-16 DIAGNOSIS — N2581 Secondary hyperparathyroidism of renal origin: Secondary | ICD-10-CM | POA: Diagnosis not present

## 2020-08-16 DIAGNOSIS — Z992 Dependence on renal dialysis: Secondary | ICD-10-CM | POA: Diagnosis not present

## 2020-08-18 DIAGNOSIS — N2581 Secondary hyperparathyroidism of renal origin: Secondary | ICD-10-CM | POA: Diagnosis not present

## 2020-08-18 DIAGNOSIS — I770 Arteriovenous fistula, acquired: Secondary | ICD-10-CM | POA: Diagnosis not present

## 2020-08-18 DIAGNOSIS — D689 Coagulation defect, unspecified: Secondary | ICD-10-CM | POA: Diagnosis not present

## 2020-08-18 DIAGNOSIS — N186 End stage renal disease: Secondary | ICD-10-CM | POA: Diagnosis not present

## 2020-08-18 DIAGNOSIS — Z992 Dependence on renal dialysis: Secondary | ICD-10-CM | POA: Diagnosis not present

## 2020-08-20 DIAGNOSIS — N186 End stage renal disease: Secondary | ICD-10-CM | POA: Diagnosis not present

## 2020-08-20 DIAGNOSIS — I770 Arteriovenous fistula, acquired: Secondary | ICD-10-CM | POA: Diagnosis not present

## 2020-08-20 DIAGNOSIS — D689 Coagulation defect, unspecified: Secondary | ICD-10-CM | POA: Diagnosis not present

## 2020-08-20 DIAGNOSIS — Z992 Dependence on renal dialysis: Secondary | ICD-10-CM | POA: Diagnosis not present

## 2020-08-20 DIAGNOSIS — N2581 Secondary hyperparathyroidism of renal origin: Secondary | ICD-10-CM | POA: Diagnosis not present

## 2020-08-23 DIAGNOSIS — D509 Iron deficiency anemia, unspecified: Secondary | ICD-10-CM | POA: Diagnosis not present

## 2020-08-23 DIAGNOSIS — Z992 Dependence on renal dialysis: Secondary | ICD-10-CM | POA: Diagnosis not present

## 2020-08-23 DIAGNOSIS — D631 Anemia in chronic kidney disease: Secondary | ICD-10-CM | POA: Diagnosis not present

## 2020-08-23 DIAGNOSIS — N186 End stage renal disease: Secondary | ICD-10-CM | POA: Diagnosis not present

## 2020-08-23 DIAGNOSIS — I770 Arteriovenous fistula, acquired: Secondary | ICD-10-CM | POA: Diagnosis not present

## 2020-08-23 DIAGNOSIS — D689 Coagulation defect, unspecified: Secondary | ICD-10-CM | POA: Diagnosis not present

## 2020-08-23 DIAGNOSIS — N2581 Secondary hyperparathyroidism of renal origin: Secondary | ICD-10-CM | POA: Diagnosis not present

## 2020-08-25 DIAGNOSIS — D509 Iron deficiency anemia, unspecified: Secondary | ICD-10-CM | POA: Diagnosis not present

## 2020-08-25 DIAGNOSIS — Z992 Dependence on renal dialysis: Secondary | ICD-10-CM | POA: Diagnosis not present

## 2020-08-25 DIAGNOSIS — D689 Coagulation defect, unspecified: Secondary | ICD-10-CM | POA: Diagnosis not present

## 2020-08-25 DIAGNOSIS — D631 Anemia in chronic kidney disease: Secondary | ICD-10-CM | POA: Diagnosis not present

## 2020-08-25 DIAGNOSIS — N186 End stage renal disease: Secondary | ICD-10-CM | POA: Diagnosis not present

## 2020-08-25 DIAGNOSIS — N2581 Secondary hyperparathyroidism of renal origin: Secondary | ICD-10-CM | POA: Diagnosis not present

## 2020-08-25 DIAGNOSIS — I770 Arteriovenous fistula, acquired: Secondary | ICD-10-CM | POA: Diagnosis not present

## 2020-08-27 DIAGNOSIS — D689 Coagulation defect, unspecified: Secondary | ICD-10-CM | POA: Diagnosis not present

## 2020-08-27 DIAGNOSIS — D509 Iron deficiency anemia, unspecified: Secondary | ICD-10-CM | POA: Diagnosis not present

## 2020-08-27 DIAGNOSIS — Z992 Dependence on renal dialysis: Secondary | ICD-10-CM | POA: Diagnosis not present

## 2020-08-27 DIAGNOSIS — D631 Anemia in chronic kidney disease: Secondary | ICD-10-CM | POA: Diagnosis not present

## 2020-08-27 DIAGNOSIS — N2581 Secondary hyperparathyroidism of renal origin: Secondary | ICD-10-CM | POA: Diagnosis not present

## 2020-08-27 DIAGNOSIS — I770 Arteriovenous fistula, acquired: Secondary | ICD-10-CM | POA: Diagnosis not present

## 2020-08-27 DIAGNOSIS — N186 End stage renal disease: Secondary | ICD-10-CM | POA: Diagnosis not present

## 2020-08-30 DIAGNOSIS — D509 Iron deficiency anemia, unspecified: Secondary | ICD-10-CM | POA: Diagnosis not present

## 2020-08-30 DIAGNOSIS — D689 Coagulation defect, unspecified: Secondary | ICD-10-CM | POA: Diagnosis not present

## 2020-08-30 DIAGNOSIS — N2581 Secondary hyperparathyroidism of renal origin: Secondary | ICD-10-CM | POA: Diagnosis not present

## 2020-08-30 DIAGNOSIS — I770 Arteriovenous fistula, acquired: Secondary | ICD-10-CM | POA: Diagnosis not present

## 2020-08-30 DIAGNOSIS — Z992 Dependence on renal dialysis: Secondary | ICD-10-CM | POA: Diagnosis not present

## 2020-08-30 DIAGNOSIS — N186 End stage renal disease: Secondary | ICD-10-CM | POA: Diagnosis not present

## 2020-09-01 DIAGNOSIS — Z992 Dependence on renal dialysis: Secondary | ICD-10-CM | POA: Diagnosis not present

## 2020-09-01 DIAGNOSIS — D689 Coagulation defect, unspecified: Secondary | ICD-10-CM | POA: Diagnosis not present

## 2020-09-01 DIAGNOSIS — N2581 Secondary hyperparathyroidism of renal origin: Secondary | ICD-10-CM | POA: Diagnosis not present

## 2020-09-01 DIAGNOSIS — N186 End stage renal disease: Secondary | ICD-10-CM | POA: Diagnosis not present

## 2020-09-01 DIAGNOSIS — D509 Iron deficiency anemia, unspecified: Secondary | ICD-10-CM | POA: Diagnosis not present

## 2020-09-01 DIAGNOSIS — I770 Arteriovenous fistula, acquired: Secondary | ICD-10-CM | POA: Diagnosis not present

## 2020-09-03 DIAGNOSIS — N186 End stage renal disease: Secondary | ICD-10-CM | POA: Diagnosis not present

## 2020-09-03 DIAGNOSIS — D689 Coagulation defect, unspecified: Secondary | ICD-10-CM | POA: Diagnosis not present

## 2020-09-03 DIAGNOSIS — I770 Arteriovenous fistula, acquired: Secondary | ICD-10-CM | POA: Diagnosis not present

## 2020-09-03 DIAGNOSIS — Z992 Dependence on renal dialysis: Secondary | ICD-10-CM | POA: Diagnosis not present

## 2020-09-03 DIAGNOSIS — D509 Iron deficiency anemia, unspecified: Secondary | ICD-10-CM | POA: Diagnosis not present

## 2020-09-03 DIAGNOSIS — N2581 Secondary hyperparathyroidism of renal origin: Secondary | ICD-10-CM | POA: Diagnosis not present

## 2020-09-06 ENCOUNTER — Emergency Department (HOSPITAL_COMMUNITY): Payer: Medicare Other

## 2020-09-06 ENCOUNTER — Encounter (HOSPITAL_COMMUNITY): Payer: Self-pay

## 2020-09-06 ENCOUNTER — Emergency Department (HOSPITAL_COMMUNITY)
Admission: EM | Admit: 2020-09-06 | Discharge: 2020-09-06 | Disposition: A | Discharge status: Home / Self Care | Payer: Medicare Other | Attending: Emergency Medicine | Admitting: Emergency Medicine

## 2020-09-06 ENCOUNTER — Other Ambulatory Visit: Payer: Self-pay

## 2020-09-06 DIAGNOSIS — R079 Chest pain, unspecified: Secondary | ICD-10-CM | POA: Diagnosis not present

## 2020-09-06 DIAGNOSIS — D689 Coagulation defect, unspecified: Secondary | ICD-10-CM | POA: Diagnosis not present

## 2020-09-06 DIAGNOSIS — I132 Hypertensive heart and chronic kidney disease with heart failure and with stage 5 chronic kidney disease, or end stage renal disease: Secondary | ICD-10-CM | POA: Insufficient documentation

## 2020-09-06 DIAGNOSIS — R0602 Shortness of breath: Secondary | ICD-10-CM | POA: Diagnosis not present

## 2020-09-06 DIAGNOSIS — R0789 Other chest pain: Secondary | ICD-10-CM | POA: Diagnosis not present

## 2020-09-06 DIAGNOSIS — R000 Tachycardia, unspecified: Secondary | ICD-10-CM | POA: Diagnosis not present

## 2020-09-06 DIAGNOSIS — I480 Paroxysmal atrial fibrillation: Secondary | ICD-10-CM | POA: Insufficient documentation

## 2020-09-06 DIAGNOSIS — Z8616 Personal history of COVID-19: Secondary | ICD-10-CM | POA: Diagnosis not present

## 2020-09-06 DIAGNOSIS — I5032 Chronic diastolic (congestive) heart failure: Secondary | ICD-10-CM | POA: Diagnosis not present

## 2020-09-06 DIAGNOSIS — I471 Supraventricular tachycardia: Secondary | ICD-10-CM | POA: Insufficient documentation

## 2020-09-06 DIAGNOSIS — I129 Hypertensive chronic kidney disease with stage 1 through stage 4 chronic kidney disease, or unspecified chronic kidney disease: Secondary | ICD-10-CM | POA: Diagnosis not present

## 2020-09-06 DIAGNOSIS — I517 Cardiomegaly: Secondary | ICD-10-CM | POA: Diagnosis not present

## 2020-09-06 DIAGNOSIS — I10 Essential (primary) hypertension: Secondary | ICD-10-CM | POA: Diagnosis not present

## 2020-09-06 DIAGNOSIS — R0902 Hypoxemia: Secondary | ICD-10-CM | POA: Diagnosis not present

## 2020-09-06 DIAGNOSIS — I770 Arteriovenous fistula, acquired: Secondary | ICD-10-CM | POA: Diagnosis not present

## 2020-09-06 DIAGNOSIS — N2581 Secondary hyperparathyroidism of renal origin: Secondary | ICD-10-CM | POA: Diagnosis not present

## 2020-09-06 DIAGNOSIS — Z7901 Long term (current) use of anticoagulants: Secondary | ICD-10-CM | POA: Insufficient documentation

## 2020-09-06 DIAGNOSIS — Z79899 Other long term (current) drug therapy: Secondary | ICD-10-CM | POA: Insufficient documentation

## 2020-09-06 DIAGNOSIS — Z992 Dependence on renal dialysis: Secondary | ICD-10-CM | POA: Insufficient documentation

## 2020-09-06 DIAGNOSIS — R030 Elevated blood-pressure reading, without diagnosis of hypertension: Secondary | ICD-10-CM

## 2020-09-06 DIAGNOSIS — N186 End stage renal disease: Secondary | ICD-10-CM | POA: Diagnosis not present

## 2020-09-06 LAB — CBC
HCT: 34.8 % — ABNORMAL LOW (ref 36.0–46.0)
Hemoglobin: 10.3 g/dL — ABNORMAL LOW (ref 12.0–15.0)
MCH: 28.2 pg (ref 26.0–34.0)
MCHC: 29.6 g/dL — ABNORMAL LOW (ref 30.0–36.0)
MCV: 95.3 fL (ref 80.0–100.0)
Platelets: 137 10*3/uL — ABNORMAL LOW (ref 150–400)
RBC: 3.65 MIL/uL — ABNORMAL LOW (ref 3.87–5.11)
RDW: 17.3 % — ABNORMAL HIGH (ref 11.5–15.5)
WBC: 7.2 10*3/uL (ref 4.0–10.5)
nRBC: 0 % (ref 0.0–0.2)

## 2020-09-06 LAB — BASIC METABOLIC PANEL
Anion gap: 11 (ref 5–15)
BUN: 18 mg/dL (ref 6–20)
CO2: 30 mmol/L (ref 22–32)
Calcium: 8.1 mg/dL — ABNORMAL LOW (ref 8.9–10.3)
Chloride: 95 mmol/L — ABNORMAL LOW (ref 98–111)
Creatinine, Ser: 6.3 mg/dL — ABNORMAL HIGH (ref 0.44–1.00)
GFR, Estimated: 7 mL/min — ABNORMAL LOW (ref >60)
Glucose, Bld: 89 mg/dL (ref 70–99)
Potassium: 4 mmol/L (ref 3.5–5.1)
Sodium: 136 mmol/L (ref 135–145)

## 2020-09-06 LAB — TROPONIN I (HIGH SENSITIVITY): Troponin I (High Sensitivity): 60 ng/L — ABNORMAL HIGH (ref <18)

## 2020-09-06 NOTE — ED Notes (Signed)
Reviewed discharge instructions with patient. Follow-up care reviewed. Patient verbalized understanding. Patient A&Ox4, VSS, and ambulatory with steady gait upon discharge.  

## 2020-09-06 NOTE — ED Triage Notes (Signed)
Pt arrived to ED via EMS from dialysis center w/ c/o A-fib RVR. 30 mins prior to the end of pt's dialysis tx she started feeling anxious and shob. EMS reports on scene pt was then c/o CP. Pt initial HR noted to be 187-200's. EMS placed 20g R hand and gave total of '20mg'$  cardizem. EMS reports pt last HR was 87 A-fib. EMS reports dialysis gave 553m: "fluids" to pt prior to EMS arrival. Pt fistula L arm.

## 2020-09-06 NOTE — Discharge Instructions (Addendum)
At this time there does not appear to be the presence of an emergent medical condition, however there is always the potential for conditions to change. Please read and follow the below instructions.  Please return to the Emergency Department immediately for any new or worsening symptoms . Please be sure to follow up with your Primary Care Provider within one week regarding your visit today; please call their office to schedule an appointment even if you are feeling better for a follow-up visit. As we discussed we offered to obtain a second heart test called a troponin level today which you refused.  You are welcome back to the emergency department at any time for further evaluation and treatment.  If your symptoms return do not wait to see if they will resolve on their own and instead return immediately to the emergency department. Your blood pressure was elevated in the emergency department today.  Please have your blood pressure rechecked by your primary care provider at your follow-up appointment and discuss medication management at that time.  Go to the nearest Emergency Department immediately if: You have fever or chills You have chest pain. Your symptoms get worse. You have trouble breathing. Your heart beats very fast for more than 20 minutes. You pass out. These symptoms may be an emergency. Get medical help right away. Call your local emergency services (911 in the [place] Do not wait to see if the symptoms will go Do not drive yourself to the hospital.   Please read the additional information packets attached to your discharge summary.  Do not take your medicine if  develop an itchy rash, swelling in your mouth or lips, or difficulty breathing; call 911 and seek immediate emergency medical attention if this occurs.  You may review your lab tests and imaging results in their entirety on your MyChart account.  Please discuss all results of fully with your primary care provider and other  specialist at your follow-up visit.  Note: Portions of this text may have been transcribed using voice recognition software. Every effort was made to ensure accuracy; however, inadvertent computerized transcription errors may still be present.

## 2020-09-06 NOTE — ED Provider Notes (Signed)
[place] EMERGENCY DEPARTMENT Provider Note   CSN: [id number] Arrival date & time: 09/06/20  1052     History Chief Complaint  Patient presents with  . A-fib RVR    Erin Dean is a 55 y.o. female history of ESRD, hypertension, CVA, SVT, CHF, A. fib on Eliquis.  Patient reports that today while at dialysis she began to feel anxious and felt her heart was racing, the sensation lasted around 20-30 minutes and caused her to feel somewhat short of breath.  They noted her heart rate to be around 180-200 bpm, EMS gave patient 500 mL IV fluid as well as 20 mg Cardizem.  Patient reports shortly after receiving the Cardizem her symptoms completely resolved.  She reports heart racing sensation has not returned and she is no longer feeling short of breath.  She has no complaints at this time.  Denies fever/chills, chest pain, cough/hemoptysis, recent illness, abdominal pain, nausea/vomiting, diarrhea, extremity swelling/color change or any additional concerns. HPI     Past Medical History:  Diagnosis Date  . ESRD (end stage renal disease) ([place])    [facility] [place]   . Hypertension   . Stroke ([place]) 04/2017   no residual . Limp left side  . SVT (supraventricular tachycardia) [facility])     Patient Active Problem List   Diagnosis Date Noted  . Chronic diastolic CHF (congestive heart failure) ([place])   . Non-ST elevation (NSTEMI) myocardial infarction ([place])   . Abnormal stress test   . Arrhythmia, atrial 06/18/2020  . Elevated troponin 06/18/2020  . Chest pain 06/18/2020  . PAF (paroxysmal atrial fibrillation) ([place])   . Atypical chest pain 06/17/2020  . Acute CVA (cerebrovascular accident) ([place]) 03/31/2020  . COVID-19 virus infection 03/31/2020  . Chronic diastolic (congestive) heart failure ([place]) 03/31/2020  . ESRD (end stage renal disease) ([place]) 05/06/2018  . Hyperlipidemia 03/11/2018  . Family hx-stroke 03/11/2018  . CKD (chronic kidney disease) stage 5, GFR less  than 15 ml/min (HCC) 07/31/2017  . Cryptogenic stroke ([place])   . Hypertensive emergency   . Demand ischemia ([place])   . Stroke-like episode ([place]) s/p tPA 04/17/2017  . Essential hypertension 12/01/2015  . Obesity 12/01/2015  . Lower extremity edema 12/01/2015  . Heart murmur 12/01/2015  . Hypertensive urgency 09/22/2012  . Anxiety 09/22/2012    Past Surgical History:  Procedure Laterality Date  . AV FISTULA PLACEMENT Right 08/16/2017   Procedure: CREATION OF RADIOCEPHALIC VERSUS BRACHIOCEPHALIC ARTERIOVENOUS FISTULA RIGHT ARM;  Surgeon: Conrad Whitewater, MD;  Location: [place];  Service: Vascular;  Laterality: Right;  . AV FISTULA PLACEMENT Right 02/07/2018   Procedure: Creation Right arm Brachiocephalic Fistula;  Surgeon: Marty Heck, MD;  Location: [facility] OR;  Service: Vascular;  Laterality: Right;  . AV FISTULA PLACEMENT Left 08/15/2018   Procedure: ARTERIOVENOUS (AV) FISTULA CREATION;  Surgeon: Serafina Mitchell, MD;  Location: [place];  Service: Vascular;  Laterality: Left;  . BASCILIC VEIN TRANSPOSITION Right 04/07/2018   Procedure: SECOND STAGE BASILIC VEIN TRANSPOSITION RIGHT ARM;  Surgeon: Marty Heck, MD;  Location: [place];  Service: Vascular;  Laterality: Right;  . CESAREAN SECTION    . CORONARY ATHERECTOMY N/A 06/22/2020   Procedure: CORONARY ATHERECTOMY;  Surgeon: Wellington Hampshire, MD;  Location: [place] CV LAB;  Service: Cardiovascular;  Laterality: N/A;  . CORONARY STENT INTERVENTION N/A 06/22/2020   Procedure: CORONARY STENT INTERVENTION;  Surgeon: Wellington Hampshire, MD;  Location: [place] CV LAB;  Service: Cardiovascular;  Laterality: N/A;  . ESOPHAGOGASTRODUODENOSCOPY N/A 04/19/2017   Procedure: ESOPHAGOGASTRODUODENOSCOPY (EGD);  Surgeon: Carol Ada, MD;  Location: [place];  Service: Endoscopy;  Laterality: N/A;  . FISTULA SUPERFICIALIZATION Right 10/01/2017   Procedure: FISTULA SUPERFICIALIZATION RIGHT ARM;  Surgeon: Conrad [place] Gates, MD;  Location: [place];   Service: Vascular;  Laterality: Right;  . FISTULA SUPERFICIALIZATION Left 10/08/2018   Procedure: FISTULA SUPERFICIALIZATION LEFT ARM;  Surgeon: Serafina Mitchell, MD;  Location: [place];  Service: Vascular;  Laterality: Left;  . INTRAVASCULAR IMAGING/OCT N/A 06/22/2020   Procedure: INTRAVASCULAR IMAGING/OCT;  Surgeon: Wellington Hampshire, MD;  Location: [place] CV LAB;  Service: Cardiovascular;  Laterality: N/A;  . IR FLUORO GUIDE CV LINE RIGHT  04/23/2018  . IR US GUIDE VASC ACCESS RIGHT  04/23/2018  . LEFT HEART CATH AND CORONARY ANGIOGRAPHY N/A 06/20/2020   Procedure: LEFT HEART CATH AND CORONARY ANGIOGRAPHY;  Surgeon: Nelva Bush, MD;  Location: [place] CV LAB;  Service: Cardiovascular;  Laterality: N/A;  . LOOP RECORDER INSERTION N/A 04/22/2017   Procedure: LOOP RECORDER INSERTION;  Surgeon: Constance Haw, MD;  Location: [place] CV LAB;  Service: Cardiovascular;  Laterality: N/A;  . RADIOLOGY WITH ANESTHESIA N/A 04/01/2020   Procedure: MRI W/O CONSTRAST  WITH ANESTHESIA;  Surgeon: Radiologist, Medication, MD;  Location: [place];  Service: Radiology;  Laterality: N/A;  . TEE WITHOUT CARDIOVERSION N/A 04/19/2017   Procedure: TRANSESOPHAGEAL ECHOCARDIOGRAM (TEE);  Surgeon: Carol Ada, MD;  Location: [place];  Service: Endoscopy;  Laterality: N/A;     OB History   No obstetric history on file.     Family History  Problem Relation Age of Onset  . Heart attack Maternal Uncle   . Heart attack Maternal Grandfather   . Stroke Paternal Grandmother   . Cancer Father   . Hypertension Sister     Social History   Tobacco Use  . Smoking status: Never Smoker  . Smokeless tobacco: Never Used  Vaping Use  . Vaping Use: Never used  Substance Use Topics  . Alcohol use: No  . Drug use: No    Home Medications Prior to Admission medications   Medication Sig Start Date End Date Taking? Authorizing Provider  acetaminophen (TYLENOL) 325 MG tablet Take 2 tablets (650 mg  total) by mouth every 6 (six) hours as needed for mild pain (or Fever >/= 101). 04/03/20  Yes Elgergawy, Silver Huguenin, MD  ALPRAZolam Duanne Moron) 0.5 MG tablet Take 0.5 mg by mouth daily as needed for anxiety.  07/16/18  Yes [provider]  amLODipine (NORVASC) 10 MG tablet Take 1 tablet (10 mg total) by mouth daily. 04/05/20  Yes Elgergawy, Silver Huguenin, MD  apixaban (ELIQUIS) 5 MG TABS tablet Take 1 tablet (5 mg total) by mouth 2 (two) times daily. 06/23/20  Yes Nita Sells, MD  aspirin 81 MG chewable tablet Chew 1 tablet (81 mg total) by mouth daily. 06/23/20  Yes Nita Sells, MD  atorvastatin (LIPITOR) 80 MG tablet Take 1 tablet (80 mg total) by mouth daily at 6 PM. 04/03/20  Yes Elgergawy, Silver Huguenin, MD  calcitRIOL (ROCALTROL) 0.25 MCG capsule Take 1 capsule (0.25 mcg total) by mouth daily. 03/11/18  Yes Donzetta Starch, NP  carvedilol (COREG) 25 MG tablet Take 1 tablet (25 mg total) by mouth 2 (two) times daily with a meal. 03/11/18  Yes Donzetta Starch, NP  Cinacalcet HCl (SENSIPAR PO) Take 1 tablet by mouth See admin instructions. Given at dialysis. Tuesday, Thursday, and  Saturday. 10/31/19 10/29/20 Yes [provider]  clopidogrel (PLAVIX) 75 MG tablet Take 1 tablet (75 mg total) by mouth daily before breakfast. 06/24/20  Yes Nita Sells, MD  cyanocobalamin 1000 MCG tablet Take 1 tablet (1,000 mcg total) by mouth daily. 03/11/18  Yes Donzetta Starch, NP  escitalopram (LEXAPRO) 10 MG tablet Take 1 tablet (10 mg total) by mouth daily. 03/11/18  Yes Donzetta Starch, NP  ferrous sulfate 325 (65 FE) MG tablet Take 1 tablet (325 mg total) by mouth 3 (three) times daily with meals. 03/11/18  Yes Donzetta Starch, NP  multivitamin (RENA-VIT) TABS tablet Take 1 tablet by mouth daily. 08/30/20  Yes [provider]  nitroGLYCERIN (NITROSTAT) 0.4 MG SL tablet Place 0.4 mg under the tongue every 5 (five) minutes x 3 doses as needed for chest pain. 05/18/20  Yes [provider]   omeprazole (PRILOSEC) 20 MG capsule Take 20 mg by mouth daily. 02/12/19  Yes [provider]  RENAGEL 800 MG tablet Take 3,200 mg by mouth See admin instructions. Take 3200 mg with each meal and snack 07/23/18  Yes [provider]  sertraline (ZOLOFT) 25 MG tablet Take 25 mg by mouth daily. 09/01/20  Yes [provider]    Allergies    Chlorhexidine gluconate  Review of Systems   Review of Systems Ten systems are reviewed and are negative for acute change except as noted in the HPI  Physical Exam Updated Vital Signs BP (!) 180/85   Pulse 67   Temp 98.1 F (36.7 C) (Oral)   Resp 18   Ht '5\' 8"'$  (1.727 m)   Wt 103.4 kg   LMP 08/12/2017   SpO2 91%   BMI 34.67 kg/m   Physical Exam Constitutional:      General: She is not in acute distress.    Appearance: Normal appearance. She is well-developed. She is not ill-appearing or diaphoretic.  HENT:     Head: Normocephalic and atraumatic.  Eyes:     General: Vision grossly intact. Gaze aligned appropriately.     Pupils: Pupils are equal, round, and reactive to light.  Neck:     Trachea: Trachea and phonation normal.  Cardiovascular:     Rate and Rhythm: Normal rate and regular rhythm.     Pulses: Normal pulses.  Pulmonary:     Effort: Pulmonary effort is normal. No respiratory distress.     Breath sounds: Normal breath sounds.  Abdominal:     General: There is no distension.     Palpations: Abdomen is soft.     Tenderness: There is no abdominal tenderness. There is no guarding or rebound.  Musculoskeletal:        General: Normal range of motion.     Cervical back: Normal range of motion.     Right lower leg: No edema.     Left lower leg: No edema.  Feet:     Right foot:     Protective Sensation: 3 sites tested. 3 sites sensed.     Left foot:     Protective Sensation: 3 sites tested. 3 sites sensed.  Skin:    General: Skin is warm and dry.  Neurological:     Mental Status: She is alert.     GCS:  GCS eye subscore is 4. GCS verbal subscore is 5. GCS motor subscore is 6.     Comments: Speech is clear and goal oriented, follows commands Major Cranial nerves without deficit, no facial droop  Moves extremities without ataxia, coordination intact  Psychiatric:        Behavior: Behavior normal.     ED Results / Procedures / Treatments   Labs (all labs ordered are listed, but only abnormal results are displayed) Labs Reviewed  BASIC METABOLIC PANEL - Abnormal; Notable for the following components:      Result Value   Chloride 95 (*)    Creatinine, Ser 6.30 (*)    Calcium 8.1 (*)    GFR, Estimated 7 (*)    All other components within normal limits  CBC - Abnormal; Notable for the following components:   RBC 3.65 (*)    Hemoglobin 10.3 (*)    HCT 34.8 (*)    MCHC 29.6 (*)    RDW 17.3 (*)    Platelets 137 (*)    All other components within normal limits  TROPONIN I (HIGH SENSITIVITY) - Abnormal; Notable for the following components:   Troponin I (High Sensitivity) 60 (*)    All other components within normal limits  TROPONIN I (HIGH SENSITIVITY)    EKG EKG Interpretation  Date/Time:  Tuesday Sep 06 2020 11:05:46 EDT Ventricular Rate:  71 PR Interval:  195 QRS Duration: 102 QT Interval:  447 QTC Calculation: 486 R Axis:   77 Text Interpretation: Sinus rhythm Consider left atrial enlargement Probable left ventricular hypertrophy Anterior Q waves, possibly due to LVH Abnormal T, consider ischemia, lateral leads Confirmed by Elnora Morrison [phone] on 09/06/2020 12:57:52 PM   Radiology DG Chest Port 1 View  Result Date: 09/06/2020 CLINICAL DATA:  Short of breath EXAM: PORTABLE CHEST 1 VIEW COMPARISON:  06/17/2020 FINDINGS: Stable enlarged cardiac silhouette. Normal mediastinum. Normal pulmonary vasculature. No evidence of effusion, infiltrate, or pneumothorax. No acute bony abnormality. IMPRESSION: No acute cardiopulmonary process.  Cardiomegaly. Electronically Signed   By:  Suzy Bouchard M.D.   On: 09/06/2020 12:00    Procedures Procedures   Medications Ordered in ED Medications - No data to display  ED Course  I have reviewed the triage vital signs and the nursing notes.  Pertinent labs & imaging results that were available during my care of the patient were reviewed by me and considered in my medical decision making (see chart for details).    MDM Rules/Calculators/A&P                         Additional history obtained from: 1. Nursing notes from this visit. 2. Review of electronic medical records. ---------------------- I ordered, reviewed and interpreted labs which include:  CBC shows mild anemia of 10.3, no leukocytosis.  BMP shows creatinine 6.30 which is consistent with ESRD status.  No emergent electrolyte derangements.  Potassium is 4.0.  High-sensitivity troponin of 60 which is improved from 2 months ago when it was in the 300s.  Review of previous medical records from 2021 show patient's troponin normally around 80-60.  Chest x-ray IMPRESSION:  No acute cardiopulmonary process. Cardiomegaly.   EKG: Sinus rhythm Consider left atrial enlargement Probable left ventricular hypertrophy Anterior Q waves, possibly due to LVH Abnormal T, consider ischemia, lateral leads Confirmed by Elnora Morrison [phone]) on 09/06/2020 12:57:52 PM -------------- Patient reassessed she is resting comfortably bed no acute distress vital signs are stable.  She has been in A. fib without tachycardia since arrival.  Blood pressure stable.  She is asymptomatic at this point and requesting discharge.  I reviewed patient's labs in detail including the elevated troponin level which appears  to be at her baseline.  I offered to perform a delta troponin for this patient but she refused, her son is here to pick her up.  Suspect patient's earlier symptoms were due to SVT, patient reports that she has not been taking her anxiety medication, Lexapro that her primary care  provider prescribed to her for this.  Patient reports that she is normally able to manage her palpitations and anxiety on her own.  I encouraged her to closely follow-up with her primary care provider for a recheck of her blood pressure and labs/symptoms.  Patient is aware that she can return to the emergency department anytime for further evaluation and treatment.   Patient seen and evaluated by Dr. Reather Converse during this visit who agrees with plan to discharge at this time with close PCP follow-up.   At this time there does not appear to be any evidence of an acute emergency medical condition and the patient appears stable for discharge with appropriate outpatient follow up. Diagnosis was discussed with patient who verbalizes understanding of care plan and is agreeable to discharge. I have discussed return precautions with patient who verbalizes understanding. Patient encouraged to follow-up with their PCP. All questions answered.   Note: Portions of this report may have been transcribed using voice recognition software. Every effort was made to ensure accuracy; however, inadvertent computerized transcription errors may still be present.  Final Clinical Impression(s) / ED Diagnoses Final diagnoses:  SVT (supraventricular tachycardia) (HCC)  Elevated blood pressure reading    Rx / DC Orders ED Discharge Orders    None       Gari Crown 09/06/20 1350    Elnora Morrison, MD 09/06/20 1534

## 2020-09-08 DIAGNOSIS — I770 Arteriovenous fistula, acquired: Secondary | ICD-10-CM | POA: Diagnosis not present

## 2020-09-08 DIAGNOSIS — N186 End stage renal disease: Secondary | ICD-10-CM | POA: Diagnosis not present

## 2020-09-08 DIAGNOSIS — D689 Coagulation defect, unspecified: Secondary | ICD-10-CM | POA: Diagnosis not present

## 2020-09-08 DIAGNOSIS — N2581 Secondary hyperparathyroidism of renal origin: Secondary | ICD-10-CM | POA: Diagnosis not present

## 2020-09-08 DIAGNOSIS — Z992 Dependence on renal dialysis: Secondary | ICD-10-CM | POA: Diagnosis not present

## 2020-09-10 DIAGNOSIS — N186 End stage renal disease: Secondary | ICD-10-CM | POA: Diagnosis not present

## 2020-09-10 DIAGNOSIS — D689 Coagulation defect, unspecified: Secondary | ICD-10-CM | POA: Diagnosis not present

## 2020-09-10 DIAGNOSIS — N2581 Secondary hyperparathyroidism of renal origin: Secondary | ICD-10-CM | POA: Diagnosis not present

## 2020-09-10 DIAGNOSIS — I770 Arteriovenous fistula, acquired: Secondary | ICD-10-CM | POA: Diagnosis not present

## 2020-09-10 DIAGNOSIS — Z992 Dependence on renal dialysis: Secondary | ICD-10-CM | POA: Diagnosis not present

## 2020-09-13 ENCOUNTER — Emergency Department (HOSPITAL_COMMUNITY)
Admission: EM | Admit: 2020-09-13 | Discharge: 2020-09-13 | Disposition: A | Discharge status: Home / Self Care | Payer: Medicare Other | Attending: Emergency Medicine | Admitting: Emergency Medicine

## 2020-09-13 ENCOUNTER — Other Ambulatory Visit: Payer: Self-pay

## 2020-09-13 ENCOUNTER — Encounter (HOSPITAL_COMMUNITY): Payer: Self-pay

## 2020-09-13 DIAGNOSIS — Z992 Dependence on renal dialysis: Secondary | ICD-10-CM | POA: Diagnosis not present

## 2020-09-13 DIAGNOSIS — Z7982 Long term (current) use of aspirin: Secondary | ICD-10-CM | POA: Diagnosis not present

## 2020-09-13 DIAGNOSIS — I4891 Unspecified atrial fibrillation: Secondary | ICD-10-CM

## 2020-09-13 DIAGNOSIS — Z9861 Coronary angioplasty status: Secondary | ICD-10-CM | POA: Insufficient documentation

## 2020-09-13 DIAGNOSIS — Z79899 Other long term (current) drug therapy: Secondary | ICD-10-CM | POA: Insufficient documentation

## 2020-09-13 DIAGNOSIS — R002 Palpitations: Secondary | ICD-10-CM | POA: Diagnosis not present

## 2020-09-13 DIAGNOSIS — I770 Arteriovenous fistula, acquired: Secondary | ICD-10-CM | POA: Diagnosis not present

## 2020-09-13 DIAGNOSIS — N2581 Secondary hyperparathyroidism of renal origin: Secondary | ICD-10-CM | POA: Diagnosis not present

## 2020-09-13 DIAGNOSIS — I5032 Chronic diastolic (congestive) heart failure: Secondary | ICD-10-CM | POA: Insufficient documentation

## 2020-09-13 DIAGNOSIS — I132 Hypertensive heart and chronic kidney disease with heart failure and with stage 5 chronic kidney disease, or end stage renal disease: Secondary | ICD-10-CM | POA: Insufficient documentation

## 2020-09-13 DIAGNOSIS — I480 Paroxysmal atrial fibrillation: Secondary | ICD-10-CM

## 2020-09-13 DIAGNOSIS — Z8616 Personal history of COVID-19: Secondary | ICD-10-CM | POA: Insufficient documentation

## 2020-09-13 DIAGNOSIS — R000 Tachycardia, unspecified: Secondary | ICD-10-CM | POA: Diagnosis not present

## 2020-09-13 DIAGNOSIS — Z20822 Contact with and (suspected) exposure to covid-19: Secondary | ICD-10-CM | POA: Insufficient documentation

## 2020-09-13 DIAGNOSIS — D631 Anemia in chronic kidney disease: Secondary | ICD-10-CM | POA: Diagnosis not present

## 2020-09-13 DIAGNOSIS — R0602 Shortness of breath: Secondary | ICD-10-CM | POA: Diagnosis not present

## 2020-09-13 DIAGNOSIS — I10 Essential (primary) hypertension: Secondary | ICD-10-CM | POA: Diagnosis not present

## 2020-09-13 DIAGNOSIS — I499 Cardiac arrhythmia, unspecified: Secondary | ICD-10-CM | POA: Diagnosis not present

## 2020-09-13 DIAGNOSIS — R231 Pallor: Secondary | ICD-10-CM | POA: Diagnosis not present

## 2020-09-13 DIAGNOSIS — N186 End stage renal disease: Secondary | ICD-10-CM

## 2020-09-13 DIAGNOSIS — D689 Coagulation defect, unspecified: Secondary | ICD-10-CM | POA: Diagnosis not present

## 2020-09-13 DIAGNOSIS — R42 Dizziness and giddiness: Secondary | ICD-10-CM | POA: Diagnosis not present

## 2020-09-13 LAB — CBC WITH DIFFERENTIAL/PLATELET
Abs Immature Granulocytes: 0.04 10*3/uL (ref 0.00–0.07)
Basophils Absolute: 0 10*3/uL (ref 0.0–0.1)
Basophils Relative: 1 %
Eosinophils Absolute: 0.2 10*3/uL (ref 0.0–0.5)
Eosinophils Relative: 3 %
HCT: 36.7 % (ref 36.0–46.0)
Hemoglobin: 11.2 g/dL — ABNORMAL LOW (ref 12.0–15.0)
Immature Granulocytes: 1 %
Lymphocytes Relative: 18 %
Lymphs Abs: 1.2 10*3/uL (ref 0.7–4.0)
MCH: 28.7 pg (ref 26.0–34.0)
MCHC: 30.5 g/dL (ref 30.0–36.0)
MCV: 94.1 fL (ref 80.0–100.0)
Monocytes Absolute: 0.6 10*3/uL (ref 0.1–1.0)
Monocytes Relative: 8 %
Neutro Abs: 4.9 10*3/uL (ref 1.7–7.7)
Neutrophils Relative %: 69 %
Platelets: 160 10*3/uL (ref 150–400)
RBC: 3.9 MIL/uL (ref 3.87–5.11)
RDW: 16.9 % — ABNORMAL HIGH (ref 11.5–15.5)
WBC: 6.9 10*3/uL (ref 4.0–10.5)
nRBC: 0 % (ref 0.0–0.2)

## 2020-09-13 LAB — BASIC METABOLIC PANEL
Anion gap: 12 (ref 5–15)
BUN: 23 mg/dL — ABNORMAL HIGH (ref 6–20)
CO2: 30 mmol/L (ref 22–32)
Calcium: 8.2 mg/dL — ABNORMAL LOW (ref 8.9–10.3)
Chloride: 94 mmol/L — ABNORMAL LOW (ref 98–111)
Creatinine, Ser: 6.25 mg/dL — ABNORMAL HIGH (ref 0.44–1.00)
GFR, Estimated: 7 mL/min — ABNORMAL LOW (ref >60)
Glucose, Bld: 98 mg/dL (ref 70–99)
Potassium: 3.5 mmol/L (ref 3.5–5.1)
Sodium: 136 mmol/L (ref 135–145)

## 2020-09-13 LAB — RESP PANEL BY RT-PCR (FLU A&B, COVID) ARPGX2
Influenza A by PCR: NEGATIVE
Influenza B by PCR: NEGATIVE
SARS Coronavirus 2 by RT PCR: NEGATIVE

## 2020-09-13 MED ORDER — AMIODARONE HCL 200 MG PO TABS: 200.0000 mg | ORAL_TABLET | Freq: Every day | ORAL | 0 refills | Status: DC

## 2020-09-13 MED ORDER — AMIODARONE HCL 200 MG PO TABS: 200.0000 mg | ORAL_TABLET | Freq: Every day | ORAL | Status: DC

## 2020-09-13 MED ORDER — SODIUM CHLORIDE 0.9 % IV SOLN
INTRAVENOUS | Status: DC
  Administered 2020-09-13: 10 mL/h via INTRAVENOUS

## 2020-09-13 MED ORDER — AMIODARONE HCL 200 MG PO TABS
400.0000 mg | ORAL_TABLET | Freq: Two times a day (BID) | ORAL | Status: DC
  Administered 2020-09-13: 400 mg via ORAL
  Filled 2020-09-13: qty 2

## 2020-09-13 MED ORDER — AMLODIPINE BESYLATE 5 MG PO TABS
10.0000 mg | ORAL_TABLET | Freq: Every day | ORAL | Status: DC
  Administered 2020-09-13: 10 mg via ORAL
  Filled 2020-09-13: qty 2

## 2020-09-13 MED ORDER — CARVEDILOL 12.5 MG PO TABS
25.0000 mg | ORAL_TABLET | Freq: Two times a day (BID) | ORAL | Status: DC
  Administered 2020-09-13: 25 mg via ORAL
  Filled 2020-09-13: qty 2

## 2020-09-13 NOTE — Consult Note (Signed)
Cardiology Consultation:   Patient ID: Erin Dean MRN: [id number]; DOB: [birth year]  Admit date: 09/13/2020 Date of Consult: 09/13/2020  PCP:  Lois Huxley, PA   [facility] Providers Cardiologist:  Skeet Latch, MD    Patient Profile:   Erin Dean is a 55 y.o. female with a hx of ESRD on HD, poorly controlled HTN, noncompliance, obesity, chronic diastolic heart failure, CVA (2019, 2021), ?SVT, and PAF on eliquis who is being seen 09/13/2020 for the evaluation of Afib RVR at the request of Dr. Zenia Resides.  History of Present Illness:   Erin Dean has a history of difficult to control hypertension and periods of noncompliance with some issues with getting medications from pharmacy. She was seen by Dr. Irish Lack in 2017 who encouraged compliance, lost to follow up until Jan 2019 when she was admitted with CVA and GI bleed in the setting of hypertensive urgency. She had an elevated troponin felt due to demand ischemia. Pulmonary edema treated with lasix. TEE negative for source of stroke. Echo with preserved EF and grade 2 DD. She was discharged with ILR. She had another stroke 03/2020 while on plavix, also found to be COVID positive. Echo with possible mobile echodensity on AV left coronary cusp. TEE deferred due to [place]. She was placed on brilinta. TEE was not completed OP and she did not follow up. CareEverywhere with appt for possible renal transplant, but "no show" appointments.  She was hospitalized 06/2020 with chest pain found to be in Afib RVR. She converted on her own prior to medications. CP resolved with conversion to NSR. She was anticoagulated. Given chest pain and EKG changes, she underwent myoview which was high risk. Planned for TEE inpatient, but pt refused sedation. TTE reviewed and felt images were more consistent with aortic valve sclerosis and not vegetation. TEE was canceled. She underwent heart cath 06/20/20 that showed 95% ostial-proximal LAD stenosis followed by 50%  stenosis, 60% distal Cx, and mild disease in the RCA. Given her co-morbid conditions and refusal of general anesthesia, not a favorable candidate for CABG. She had orbital atherectomy and DES to ostial-proximal LAD on 06/22/20. She was on triple therapy for 1 week, then on plavix and eliquis. She was seen back in the ER on 09/06/20 with Afib RVR. When EMS arrived, HR 180-200 and she was given 20 mg IV cardizem. She converted to sinus rhythm and continued to be in sinus in the ER. She was discharged without admission.   She presented today 09/13/20 with palpitations at HD. She was lightheaded, short of breath, and diaphoretic. EMS cardioverted once, but reverted back to Afib. She was cardioverted a second time and has remained in sinus rhythm since. On arrival to ER, she remains in sinus rhythm with PVC and PACs. Cardiology consulted for further management.   She reports feeling heart palpitations after about 3 hrs of a 3 hr 45 min HD session. She denies chest pain, palpitations, or significant fluid overload prior to HD. She had been compliant with coreg and amlodipine, but BP still running high. She has been compliant with HD sessions.   Her main complaint currently is low appetite and shortness of breath when she is in her apartment. She is not short of breath when outside her apartment at HD or in the ER. She questions if air filters need to be changed or if she needs supplemental O2 at home.    Past Medical History:  Diagnosis Date  . ESRD (end stage renal disease) (  [place])    TTHS [place]   . Hypertension   . Stroke ([place]) 04/2017   no residual . Limp left side  . SVT (supraventricular tachycardia) ([facility])     Past Surgical History:  Procedure Laterality Date  . AV FISTULA PLACEMENT Right 08/16/2017   Procedure: CREATION OF RADIOCEPHALIC VERSUS BRACHIOCEPHALIC ARTERIOVENOUS FISTULA RIGHT ARM;  Surgeon: Conrad Pickstown, MD;  Location: [place];  Service: Vascular;  Laterality: Right;  . AV FISTULA PLACEMENT  Right 02/07/2018   Procedure: Creation Right arm Brachiocephalic Fistula;  Surgeon: Marty Heck, MD;  Location: [facility] OR;  Service: Vascular;  Laterality: Right;  . AV FISTULA PLACEMENT Left 08/15/2018   Procedure: ARTERIOVENOUS (AV) FISTULA CREATION;  Surgeon: Serafina Mitchell, MD;  Location: [place];  Service: Vascular;  Laterality: Left;  . BASCILIC VEIN TRANSPOSITION Right 04/07/2018   Procedure: SECOND STAGE BASILIC VEIN TRANSPOSITION RIGHT ARM;  Surgeon: Marty Heck, MD;  Location: [place];  Service: Vascular;  Laterality: Right;  . CESAREAN SECTION    . CORONARY ATHERECTOMY N/A 06/22/2020   Procedure: CORONARY ATHERECTOMY;  Surgeon: Wellington Hampshire, MD;  Location: [place] CV [facility];  Service: Cardiovascular;  Laterality: N/A;  . CORONARY STENT INTERVENTION N/A 06/22/2020   Procedure: CORONARY STENT INTERVENTION;  Surgeon: Wellington Hampshire, MD;  Location: [place] [facility];  Service: Cardiovascular;  Laterality: N/A;  . ESOPHAGOGASTRODUODENOSCOPY N/A 04/19/2017   Procedure: ESOPHAGOGASTRODUODENOSCOPY (EGD);  Surgeon: Carol Ada, MD;  Location: [place];  Service: Endoscopy;  Laterality: N/A;  . FISTULA SUPERFICIALIZATION Right 10/01/2017   Procedure: FISTULA SUPERFICIALIZATION RIGHT ARM;  Surgeon: Conrad Hostetter, MD;  Location: [place];  Service: Vascular;  Laterality: Right;  . FISTULA SUPERFICIALIZATION Left 10/08/2018   Procedure: FISTULA SUPERFICIALIZATION LEFT ARM;  Surgeon: Serafina Mitchell, MD;  Location: [place];  Service: Vascular;  Laterality: Left;  . INTRAVASCULAR IMAGING/OCT N/A 06/22/2020   Procedure: INTRAVASCULAR IMAGING/OCT;  Surgeon: Wellington Hampshire, MD;  Location: [place] [facility];  Service: Cardiovascular;  Laterality: N/A;  . IR FLUORO GUIDE CV LINE RIGHT  04/23/2018  . IR US GUIDE VASC ACCESS RIGHT  04/23/2018  . LEFT HEART CATH AND CORONARY ANGIOGRAPHY N/A 06/20/2020   Procedure: LEFT HEART CATH AND CORONARY ANGIOGRAPHY;  Surgeon: Nelva Bush, MD;   Location: [place] [facility];  Service: Cardiovascular;  Laterality: N/A;  . LOOP RECORDER INSERTION N/A 04/22/2017   Procedure: LOOP RECORDER INSERTION;  Surgeon: Constance Haw, MD;  Location: [place] [facility];  Service: Cardiovascular;  Laterality: N/A;  . RADIOLOGY WITH ANESTHESIA N/A 04/01/2020   Procedure: MRI W/O CONSTRAST  WITH ANESTHESIA;  Surgeon: Radiologist, Medication, MD;  Location: [place];  Service: Radiology;  Laterality: N/A;  . TEE WITHOUT CARDIOVERSION N/A 04/19/2017   Procedure: TRANSESOPHAGEAL ECHOCARDIOGRAM (TEE);  Surgeon: Carol Ada, MD;  Location: [place];  Service: Endoscopy;  Laterality: N/A;     Home Medications:  Prior to Admission medications   Medication Sig Start Date End Date Taking? Authorizing Provider  acetaminophen (TYLENOL) 325 MG tablet Take 2 tablets (650 mg total) by mouth every 6 (six) hours as needed for mild pain (or Fever >/= 101). 04/03/20   Elgergawy, Silver Huguenin, MD  ALPRAZolam Duanne Moron) 0.5 MG tablet Take 0.5 mg by mouth daily as needed for anxiety.  07/16/18   [provider]  amLODipine (NORVASC) 10 MG tablet Take 1 tablet (10 mg total) by mouth daily. 04/05/20   Elgergawy, Silver Huguenin, MD  apixaban Arne Cleveland)  5 MG TABS tablet Take 1 tablet (5 mg total) by mouth 2 (two) times daily. 06/23/20   Nita Sells, MD  aspirin 81 MG chewable tablet Chew 1 tablet (81 mg total) by mouth daily. 06/23/20   Nita Sells, MD  atorvastatin (LIPITOR) 80 MG tablet Take 1 tablet (80 mg total) by mouth daily at 6 PM. 04/03/20   Elgergawy, Silver Huguenin, MD  calcitRIOL (ROCALTROL) 0.25 MCG capsule Take 1 capsule (0.25 mcg total) by mouth daily. 03/11/18   Donzetta Starch, NP  carvedilol (COREG) 25 MG tablet Take 1 tablet (25 mg total) by mouth 2 (two) times daily with a meal. 03/11/18   Donzetta Starch, NP  Cinacalcet HCl (SENSIPAR PO) Take 1 tablet by mouth See admin instructions. Given at dialysis. Tuesday, Thursday, and Saturday. 10/31/19 10/29/20   [provider]  clopidogrel (PLAVIX) 75 MG tablet Take 1 tablet (75 mg total) by mouth daily before breakfast. 06/24/20   Nita Sells, MD  cyanocobalamin 1000 MCG tablet Take 1 tablet (1,000 mcg total) by mouth daily. 03/11/18   Donzetta Starch, NP  escitalopram (LEXAPRO) 10 MG tablet Take 1 tablet (10 mg total) by mouth daily. 03/11/18   Donzetta Starch, NP  ferrous sulfate 325 (65 FE) MG tablet Take 1 tablet (325 mg total) by mouth 3 (three) times daily with meals. 03/11/18   Donzetta Starch, NP  multivitamin (RENA-VIT) TABS tablet Take 1 tablet by mouth daily. 08/30/20   [provider]  nitroGLYCERIN (NITROSTAT) 0.4 MG SL tablet Place 0.4 mg under the tongue every 5 (five) minutes x 3 doses as needed for chest pain. 05/18/20   [provider]  omeprazole (PRILOSEC) 20 MG capsule Take 20 mg by mouth daily. 02/12/19   [provider]  RENAGEL 800 MG tablet Take 3,200 mg by mouth See admin instructions. Take 3200 mg with each meal and snack 07/23/18   [provider]  sertraline (ZOLOFT) 25 MG tablet Take 25 mg by mouth daily. 09/01/20   [provider]    Inpatient Medications: Scheduled Meds:  Continuous Infusions: . sodium chloride 10 mL/hr (09/13/20 0952)   PRN Meds:   Allergies:    Allergies  Allergen Reactions  . Chlorhexidine Gluconate Rash    Social History:   Social History   Socioeconomic History  . Marital status: Legally Separated    Spouse name: Not on file  . Number of children: Not on file  . Years of education: Not on file  . Highest education level: Not on file  Occupational History  . Not on file  Tobacco Use  . Smoking status: Never Smoker  . Smokeless tobacco: Never Used  Vaping Use  . Vaping Use: Never used  Substance and Sexual Activity  . Alcohol use: No  . Drug use: No  . Sexual activity: Not on file  Other Topics Concern  . Not on file  Social History Narrative   Lives with daughter    Education- high school   On temp disability   Social Determinants of Health   Financial Resource Strain: Not on file  Food Insecurity: Not on file  Transportation Needs: Not on file  Physical Activity: Not on file  Stress: Not on file  Social Connections: Not on file  Intimate Partner Violence: Not on file    Family History:    Family History  Problem Relation Age of Onset  . Heart attack Maternal Uncle   . Heart attack Maternal Grandfather   .  Stroke Paternal Grandmother   . Cancer Father   . Hypertension Sister      ROS:  Please see the history of present illness.   All other ROS reviewed and negative.     Physical Exam/Data:   Vitals:   09/13/20 1000 09/13/20 1030 09/13/20 1100 09/13/20 1130  BP: (!) 172/110 (!) 183/110 (!) 186/110 (!) 192/108  Pulse: 81 79 66 74  Resp: (!) 23 19 (!) 23 (!) 23  Temp:      TempSrc:      SpO2: 95% 93% 93% 99%   No intake or output data in the 24 hours ending 09/13/20 1213 Last 3 Weights 09/06/2020 06/23/2020 06/23/2020  Weight (lbs) 228 lb 244 lb 14.9 oz 249 lb 5.4 oz  Weight (kg) 103.42 kg 111.1 kg 113.1 kg     There is no height or weight on file to calculate BMI.  General:  Obese female in NAD HEENT: normal Neck: no JVD Vascular: No carotid bruits  Cardiac:  normal S1, S2; RRR; + systolic murmur Lungs:  clear to auscultation bilaterally, no wheezing, rhonchi or rales  Abd: soft, nontender, no hepatomegaly  Ext: no edema Musculoskeletal:  No deformities, BUE and BLE strength normal and equal Skin: warm and dry  Neuro:  CNs 2-12 intact, no focal abnormalities noted Psych:  Normal affect   EKG:  The EKG was personally reviewed and demonstrates:  Sinus rhythm with HR 87 Telemetry:  Telemetry was personally reviewed and demonstrates:  Sinus rhythm with PACs  Relevant CV Studies:  Heart cath 06/22/20:  Dist LM lesion is 15% stenosed.  1st Diag lesion is 70% stenosed.  Mid LAD lesion is 50% stenosed.  Ost LAD to Prox  LAD lesion is 95% stenosed.  Dist LAD lesion is 30% stenosed.  1st Mrg lesion is 30% stenosed.  Dist Cx lesion is 60% stenosed with 60% stenosed side branch in LPAV.  Ramus lesion is 60% stenosed.  Post intervention, there is a 0% residual stenosis.  A drug-eluting stent was successfully placed using a STENT RESOLUTE ONYX 3.5X15.   Successful OCT guided orbital atherectomy and drug-eluting stent placement to the ostial LAD.  OCT images were overall suboptimal due to high flow state and difficulty clearing blood with contrast.  Recommendations: Dual antiplatelet therapy with aspirin and clopidogrel.  Aspirin can be discontinued after 1 week to minimize the risk of bleeding given that she is on anticoagulation. Anticoagulation can be resumed tomorrow if no bleeding issues from the right femoral artery.   Echo limited 06/19/20: 1. The aortic valve is tricuspid and the leaflets are moderately  calcified. There is trivial aortic regurgitation. There is no apparent  vegetation on the current study. It appears to just be calcium on the  valve. Additionally, there is no significant  regurgitation, which goes against a destructive lesion such as  endocarditis. If there are clinical concerns for endocarditis, would  recommend a TEE. The aortic valve is tricuspid. There is moderate  calcification of the aortic valve. There is moderate  thickening of the aortic valve. Aortic valve regurgitation is trivial.  Mild to moderate aortic valve sclerosis/calcification is present, without  any evidence of aortic stenosis.  2. Left ventricular ejection fraction, by estimation, is 60 to 65%. The  left ventricle has normal function. The left ventricle has no regional  wall motion abnormalities.  3. A small pericardial effusion is present. The pericardial effusion is  circumferential.  4. The mitral valve is degenerative. Mild mitral valve  regurgitation. No  evidence of mitral stenosis.  5. There  is mildly elevated pulmonary artery systolic pressure.   Laboratory Data:  High Sensitivity Troponin:   Recent Labs  Lab 09/06/20 1157  TROPONINIHS 60*     Chemistry Recent Labs  Lab 09/13/20 0945  NA 136  K 3.5  CL 94*  CO2 30  GLUCOSE 98  BUN 23*  CREATININE 6.25*  CALCIUM 8.2*  GFRNONAA 7*  ANIONGAP 12    No results for input(s): PROT, ALBUMIN, AST, ALT, ALKPHOS, BILITOT in the last 168 hours. Hematology Recent Labs  Lab 09/13/20 0945  WBC 6.9  RBC 3.90  HGB 11.2*  HCT 36.7  MCV 94.1  MCH 28.7  MCHC 30.5  RDW 16.9*  PLT 160   BNPNo results for input(s): BNP, PROBNP in the last 168 hours.  DDimer No results for input(s): DDIMER in the last 168 hours.   Radiology/Studies:  No results found.   Assessment and Plan:   Paroxysmal Afib RVR - converted with IV cardizem push 09/06/20 - when EMS arrived, she was cardioverted x 2 to NSR - she remains in NSR in the ER  I have reviewed EMS run sheet for 5/31 and EMS strips from today. Appears to be in Afib RVR. - given recurrences of Afib, may need referral to EP for consideration of ablation, although she has previously refused sedation for TEE - her best option right now may be starting an antiarrhythmic such as amiodarone - I will start PO amiodarone load of 400 mg BID x 14 days, then 200 mg daily   Chronic anticoagulation Hx of GI bleed - no active bleeding - continue eliquis following recurrence of Afib and cardioversion   Shortness of breath - lungs are clear on exam - she does not appear volume up - evaluate need for supplemental O2, per primary   CAD  - orbital atherectomy and DES to ostial-proximal LAD - remains on plavix and eliquis - no chest pain prior to Afib today, chest soreness following cardioversion   Hypertension - home regimen includes 25 mg coreg and 10 mg amlodipine - she is hypertensive at 162/88, was in the 180s when I was in the room - historically has been on 100 mg TID  hydralazine but discontinued at discharge 06/23/20 - will restart 50 mg hydralazine TID --> will likely need to titrate this at OP follow up   Recurrent stroke - maintained on ASA and brilinta  - no focal deficits noted on exam   Risk Assessment/Risk Scores:    CHA2DS2-VASc Score = 6 This indicates a 9.7% annual risk of stroke. The patient's score is based upon: CHF History: Yes HTN History: Yes Diabetes History: No Stroke History: Yes Vascular Disease History: Yes Age Score: 0 Gender Score: 1       For questions or updates, please contact [place] Please consult [email or web address] for contact info under    Signed, Ledora Bottcher, [place]  09/13/2020 12:13 PM

## 2020-09-13 NOTE — Discharge Instructions (Addendum)
Start amiodarone '400mg'$  twice daily for 7 days then '200mg'$  daily thereafter.  Continue Eliquis.  Follow-up with Dr. Oval Linsey in 1 week.  Call the office tomorrow to schedule appointment.  If develop chest pain, palpitations, shortness of breath or other new concerning symptom, contact ER for reassessment.

## 2020-09-13 NOTE — ED Provider Notes (Addendum)
[facility] EMERGENCY DEPARTMENT Provider Note   CSN: [id number] Arrival date & time:        History Chief Complaint  Patient presents with  . Shortness of Breath  . Palpitations    Erin Dean is a 55 y.o. female.  55 year old female presents after experiencing SVT just prior to arrival.  Patient was at dialysis and experienced palpitations.  Denies any chest pain or chest pressure.  Patient noted to be in A. fib with RVR.  Has history of same and takes Eliquis and amiodarone.  EMS was called and patient was hypotensive and was cardioverted once.  She required a second cardioversion after she was only able to maintain sinus rhythm for short period of time.  She is now back in sinus rhythm.  She denies chest pain        Past Medical History:  Diagnosis Date  . ESRD (end stage renal disease) ([place])    [facility] [place]   . Hypertension   . Stroke ([place]) 04/2017   no residual . Limp left side  . SVT (supraventricular tachycardia) [facility])     Patient Active Problem List   Diagnosis Date Noted  . Chronic diastolic CHF (congestive heart failure) ([place])   . Non-ST elevation (NSTEMI) myocardial infarction ([place])   . Abnormal stress test   . Arrhythmia, atrial 06/18/2020  . Elevated troponin 06/18/2020  . Chest pain 06/18/2020  . PAF (paroxysmal atrial fibrillation) ([place])   . Atypical chest pain 06/17/2020  . Acute CVA (cerebrovascular accident) ([place]) 03/31/2020  . COVID-19 virus infection 03/31/2020  . Chronic diastolic (congestive) heart failure ([place]) 03/31/2020  . ESRD (end stage renal disease) ([place]) 05/06/2018  . Hyperlipidemia 03/11/2018  . Family hx-stroke 03/11/2018  . CKD (chronic kidney disease) stage 5, GFR less than 15 ml/min (H[facility]) 07/31/2017  . Cryptogenic stroke ([place])   . Hypertensive emergency   . Demand ischemia ([place])   . Stroke-like episode ([place]) s/p tPA 04/17/2017  . Essential hypertension 12/01/2015  . Obesity 12/01/2015  . Lower extremity  edema 12/01/2015  . Heart murmur 12/01/2015  . Hypertensive urgency 09/22/2012  . Anxiety 09/22/2012    Past Surgical History:  Procedure Laterality Date  . AV FISTULA PLACEMENT Right 08/16/2017   Procedure: CREATION OF RADIOCEPHALIC VERSUS BRACHIOCEPHALIC ARTERIOVENOUS FISTULA RIGHT ARM;  Surgeon: Conrad Kittanning, MD;  Location: [facility];  Service: Vascular;  Laterality: Right;  . AV FISTULA PLACEMENT Right 02/07/2018   Procedure: Creation Right arm Brachiocephalic Fistula;  Surgeon: Marty Heck, MD;  [facility];  Service: Vascular;  Laterality: Right;  . AV FISTULA PLACEMENT Left 08/15/2018   Procedure: ARTERIOVENOUS (AV) FISTULA CREATION;  Surgeon: Serafina Mitchell, MD;  Location: [facility];  Service: Vascular;  Laterality: Left;  . BASCILIC VEIN TRANSPOSITION Right 04/07/2018   Procedure: SECOND STAGE BASILIC VEIN TRANSPOSITION RIGHT ARM;  Surgeon: Marty Heck, MD;  Location: [facility];  Service: Vascular;  Laterality: Right;  . CESAREAN SECTION    . CORONARY ATHERECTOMY N/A 06/22/2020   Procedure: CORONARY ATHERECTOMY;  Surgeon: Wellington Hampshire, MD;  Location: [facility] CV LAB;  Service: Cardiovascular;  Laterality: N/A;  . CORONARY STENT INTERVENTION N/A 06/22/2020   Procedure: CORONARY STENT INTERVENTION;  Surgeon: Wellington Hampshire, MD;  Location: [facility] CV LAB;  Service: Cardiovascular;  Laterality: N/A;  . ESOPHAGOGASTRODUODENOSCOPY N/A 04/19/2017   Procedure: ESOPHAGOGASTRODUODENOSCOPY (EGD);  Surgeon: Carol Ada, MD;  Location: [facility];  Service: Endoscopy;  Laterality: N/A;  .  FISTULA SUPERFICIALIZATION Right 10/01/2017   Procedure: FISTULA SUPERFICIALIZATION RIGHT ARM;  Surgeon: Conrad La Coma, MD;  Location: [facility];  Service: Vascular;  Laterality: Right;  . FISTULA SUPERFICIALIZATION Left 10/08/2018   Procedure: FISTULA SUPERFICIALIZATION LEFT ARM;  Surgeon: Serafina Mitchell, MD;  Location: [facility];  Service: Vascular;  Laterality: Left;  . INTRAVASCULAR  IMAGING/OCT N/A 06/22/2020   Procedure: INTRAVASCULAR IMAGING/OCT;  Surgeon: Wellington Hampshire, MD;  Location: [facility] CV LAB;  Service: Cardiovascular;  Laterality: N/A;  . IR FLUORO GUIDE CV LINE RIGHT  04/23/2018  . IR US GUIDE VASC ACCESS RIGHT  04/23/2018  . LEFT HEART CATH AND CORONARY ANGIOGRAPHY N/A 06/20/2020   Procedure: LEFT HEART CATH AND CORONARY ANGIOGRAPHY;  Surgeon: Nelva Bush, MD;  Location: [facility] CV LAB;  Service: Cardiovascular;  Laterality: N/A;  . LOOP RECORDER INSERTION N/A 04/22/2017   Procedure: LOOP RECORDER INSERTION;  Surgeon: Constance Haw, MD;  Location: [facility] CV LAB;  Service: Cardiovascular;  Laterality: N/A;  . RADIOLOGY WITH ANESTHESIA N/A 04/01/2020   Procedure: MRI W/O CONSTRAST  WITH ANESTHESIA;  Surgeon: Radiologist, Medication, MD;  Location: [facility];  Service: Radiology;  Laterality: N/A;  . TEE WITHOUT CARDIOVERSION N/A 04/19/2017   Procedure: TRANSESOPHAGEAL ECHOCARDIOGRAM (TEE);  Surgeon: Carol Ada, MD;  Location: [facility];  Service: Endoscopy;  Laterality: N/A;     OB History   No obstetric history on file.     Family History  Problem Relation Age of Onset  . Heart attack Maternal Uncle   . Heart attack Maternal Grandfather   . Stroke Paternal Grandmother   . Cancer Father   . Hypertension Sister     Social History   Tobacco Use  . Smoking status: Never Smoker  . Smokeless tobacco: Never Used  Vaping Use  . Vaping Use: Never used  Substance Use Topics  . Alcohol use: No  . Drug use: No    Home Medications Prior to Admission medications   Medication Sig Start Date End Date Taking? Authorizing Provider  acetaminophen (TYLENOL) 325 MG tablet Take 2 tablets (650 mg total) by mouth every 6 (six) hours as needed for mild pain (or Fever >/= 101). 04/03/20   Elgergawy, Silver Huguenin, MD  ALPRAZolam Duanne Moron) 0.5 MG tablet Take 0.5 mg by mouth daily as needed for anxiety.  07/16/18   [provider]   amLODipine (NORVASC) 10 MG tablet Take 1 tablet (10 mg total) by mouth daily. 04/05/20   Elgergawy, Silver Huguenin, MD  apixaban (ELIQUIS) 5 MG TABS tablet Take 1 tablet (5 mg total) by mouth 2 (two) times daily. 06/23/20   Nita Sells, MD  aspirin 81 MG chewable tablet Chew 1 tablet (81 mg total) by mouth daily. 06/23/20   Nita Sells, MD  atorvastatin (LIPITOR) 80 MG tablet Take 1 tablet (80 mg total) by mouth daily at 6 PM. 04/03/20   Elgergawy, Silver Huguenin, MD  calcitRIOL (ROCALTROL) 0.25 MCG capsule Take 1 capsule (0.25 mcg total) by mouth daily. 03/11/18   Donzetta Starch, NP  carvedilol (COREG) 25 MG tablet Take 1 tablet (25 mg total) by mouth 2 (two) times daily with a meal. 03/11/18   Donzetta Starch, NP  Cinacalcet HCl (SENSIPAR PO) Take 1 tablet by mouth See admin instructions. Given at dialysis. Tuesday, Thursday, and Saturday. 10/31/19 10/29/20  [provider]  clopidogrel (PLAVIX) 75 MG tablet Take 1 tablet (75 mg total) by mouth daily before breakfast. 06/24/20   Nita Sells,  MD  cyanocobalamin 1000 MCG tablet Take 1 tablet (1,000 mcg total) by mouth daily. 03/11/18   Donzetta Starch, NP  escitalopram (LEXAPRO) 10 MG tablet Take 1 tablet (10 mg total) by mouth daily. 03/11/18   Donzetta Starch, NP  ferrous sulfate 325 (65 FE) MG tablet Take 1 tablet (325 mg total) by mouth 3 (three) times daily with meals. 03/11/18   Donzetta Starch, NP  multivitamin (RENA-VIT) TABS tablet Take 1 tablet by mouth daily. 08/30/20   [provider]  nitroGLYCERIN (NITROSTAT) 0.4 MG SL tablet Place 0.4 mg under the tongue every 5 (five) minutes x 3 doses as needed for chest pain. 05/18/20   [provider]  omeprazole (PRILOSEC) 20 MG capsule Take 20 mg by mouth daily. 02/12/19   [provider]  RENAGEL 800 MG tablet Take 3,200 mg by mouth See admin instructions. Take 3200 mg with each meal and snack 07/23/18   [provider]  sertraline (ZOLOFT) 25 MG tablet  Take 25 mg by mouth daily. 09/01/20   [provider]    Allergies    Chlorhexidine gluconate  Review of Systems   Review of Systems  All other systems reviewed and are negative.   Physical Exam Updated Vital Signs LMP 08/12/2017   Physical Exam Vitals and nursing note reviewed.  Constitutional:      General: She is not in acute distress.    Appearance: Normal appearance. She is well-developed. She is not toxic-appearing.  HENT:     Head: Normocephalic and atraumatic.  Eyes:     General: Lids are normal.     Conjunctiva/sclera: Conjunctivae normal.     Pupils: Pupils are equal, round, and reactive to light.  Neck:     Thyroid: No thyroid mass.     Trachea: No tracheal deviation.  Cardiovascular:     Rate and Rhythm: Normal rate and regular rhythm.     Heart sounds: Normal heart sounds. No murmur heard. No gallop.   Pulmonary:     Effort: Pulmonary effort is normal. No respiratory distress.     Breath sounds: Normal breath sounds. No stridor. No decreased breath sounds, wheezing, rhonchi or rales.  Abdominal:     General: Bowel sounds are normal. There is no distension.     Palpations: Abdomen is soft.     Tenderness: There is no abdominal tenderness. There is no rebound.  Musculoskeletal:        General: No tenderness. Normal range of motion.     Cervical back: Normal range of motion and neck supple.  Skin:    General: Skin is warm and dry.     Findings: No abrasion or rash.  Neurological:     Mental Status: She is alert and oriented to person, place, and time.     GCS: GCS eye subscore is 4. GCS verbal subscore is 5. GCS motor subscore is 6.     Cranial Nerves: No cranial nerve deficit.     Sensory: No sensory deficit.  Psychiatric:        Speech: Speech normal.        Behavior: Behavior normal.     ED Results / Procedures / Treatments   Labs (all labs ordered are listed, but only abnormal results are displayed) Labs Reviewed  RESP PANEL BY RT-PCR  (FLU A&B, COVID) ARPGX2  CBC WITH DIFFERENTIAL/PLATELET  BASIC METABOLIC PANEL    EKG EKG Interpretation  Date/Time:  Tuesday September 13 2020 09:32:58 EDT Ventricular  Rate:  87 PR Interval:  184 QRS Duration: 106 QT Interval:  379 QTC Calculation: 456 R Axis:   60 Text Interpretation: Sinus rhythm Borderline repolarization abnormality Confirmed by [facility], : ([id number]), editor Stetler, Angela ([id number]) on 09/13/2020 9:34:58 AM   Radiology No results found.  Procedures Procedures   Medications Ordered in ED Medications  0.9 %  sodium chloride infusion (has no administration in time range)    ED Course  I have reviewed the triage vital signs and the nursing notes.  Pertinent labs & imaging results that were available during my care of the patient were reviewed by me and considered in my medical decision making (see chart for details).    MDM Rules/Calculators/A&P                          Patient in normal sinus rhythm here.  Cardiology consult obtained and patient seen by Dr. Oval Linsey.  Patient has been loaded with amiodarone and plan will be to observe for 2 to 3 hours after this and discharge patient if she maintains a systolic blood pressure less than 150 and remains in sinus rhythm. Final Clinical Impression(s) / ED Diagnoses Final diagnoses:  None    Rx / DC Orders ED Discharge Orders    None       Lacretia Leigh, MD 09/13/20 1415    Lacretia Leigh, MD 09/13/20 1416

## 2020-09-13 NOTE — ED Provider Notes (Signed)
55 year old lady presented to ER with palpitations.  EMS found patient to be in A. fib with RVR and reported hypotension.  Underwent cardioversion prior to arrival.  Patient in sinus rhythm here.  Cardiology evaluated, loaded with amiodarone.  They recommended observing in ER for couple hours.  If patient remains well-appearing, remained in sinus rhythm then okay for discharge.  Reassessed patient, she is currently in sinus rhythm, she denies any ongoing medical complaints.  Discussed return precautions, need for follow-up in cardiology clinic.  Discussed amiodarone regimen.  Already on eliquis. Sent Rx for amio.  Discharged.   Lucrezia Starch, MD 09/13/20 1745

## 2020-09-13 NOTE — ED Triage Notes (Signed)
Pt BIB [facility] from dialysis center w/palpitations, hx of A-Fib RVR, pt c/o lightheaded, malaise, SOB, diaphoretic, clammy.   Pt alert and oriented   Pt given 2.5 mg Versed IM prior to each cardioversion 100 jules unsuccessful 200 jules converted    BP 113/90 HR 150 98% RA, 2L [place] for comfort  RR 25

## 2020-09-17 DIAGNOSIS — I770 Arteriovenous fistula, acquired: Secondary | ICD-10-CM | POA: Diagnosis not present

## 2020-09-17 DIAGNOSIS — N2581 Secondary hyperparathyroidism of renal origin: Secondary | ICD-10-CM | POA: Diagnosis not present

## 2020-09-17 DIAGNOSIS — D631 Anemia in chronic kidney disease: Secondary | ICD-10-CM | POA: Diagnosis not present

## 2020-09-17 DIAGNOSIS — D689 Coagulation defect, unspecified: Secondary | ICD-10-CM | POA: Diagnosis not present

## 2020-09-17 DIAGNOSIS — N186 End stage renal disease: Secondary | ICD-10-CM | POA: Diagnosis not present

## 2020-09-17 DIAGNOSIS — Z992 Dependence on renal dialysis: Secondary | ICD-10-CM | POA: Diagnosis not present

## 2020-09-20 ENCOUNTER — Emergency Department (HOSPITAL_COMMUNITY): Payer: Medicare Other

## 2020-09-20 ENCOUNTER — Observation Stay (HOSPITAL_COMMUNITY)
Admission: EM | Admit: 2020-09-20 | Discharge: 2020-09-21 | Disposition: A | Discharge status: Home / Self Care | Payer: Medicare Other | Attending: Student | Admitting: Student

## 2020-09-20 DIAGNOSIS — F419 Anxiety disorder, unspecified: Secondary | ICD-10-CM | POA: Diagnosis not present

## 2020-09-20 DIAGNOSIS — Z79899 Other long term (current) drug therapy: Secondary | ICD-10-CM | POA: Diagnosis not present

## 2020-09-20 DIAGNOSIS — R079 Chest pain, unspecified: Secondary | ICD-10-CM | POA: Diagnosis not present

## 2020-09-20 DIAGNOSIS — Z992 Dependence on renal dialysis: Secondary | ICD-10-CM | POA: Insufficient documentation

## 2020-09-20 DIAGNOSIS — R059 Cough, unspecified: Secondary | ICD-10-CM | POA: Diagnosis not present

## 2020-09-20 DIAGNOSIS — I4891 Unspecified atrial fibrillation: Secondary | ICD-10-CM

## 2020-09-20 DIAGNOSIS — I10 Essential (primary) hypertension: Secondary | ICD-10-CM | POA: Diagnosis not present

## 2020-09-20 DIAGNOSIS — R064 Hyperventilation: Secondary | ICD-10-CM | POA: Diagnosis not present

## 2020-09-20 DIAGNOSIS — Z7901 Long term (current) use of anticoagulants: Secondary | ICD-10-CM | POA: Diagnosis not present

## 2020-09-20 DIAGNOSIS — Z7982 Long term (current) use of aspirin: Secondary | ICD-10-CM | POA: Insufficient documentation

## 2020-09-20 DIAGNOSIS — I5032 Chronic diastolic (congestive) heart failure: Secondary | ICD-10-CM | POA: Diagnosis present

## 2020-09-20 DIAGNOSIS — E785 Hyperlipidemia, unspecified: Secondary | ICD-10-CM | POA: Diagnosis present

## 2020-09-20 DIAGNOSIS — I5033 Acute on chronic diastolic (congestive) heart failure: Secondary | ICD-10-CM | POA: Insufficient documentation

## 2020-09-20 DIAGNOSIS — D631 Anemia in chronic kidney disease: Secondary | ICD-10-CM | POA: Insufficient documentation

## 2020-09-20 DIAGNOSIS — R0789 Other chest pain: Secondary | ICD-10-CM | POA: Diagnosis not present

## 2020-09-20 DIAGNOSIS — E875 Hyperkalemia: Secondary | ICD-10-CM | POA: Insufficient documentation

## 2020-09-20 DIAGNOSIS — Z8616 Personal history of COVID-19: Secondary | ICD-10-CM | POA: Insufficient documentation

## 2020-09-20 DIAGNOSIS — E6609 Other obesity due to excess calories: Secondary | ICD-10-CM

## 2020-09-20 DIAGNOSIS — I517 Cardiomegaly: Secondary | ICD-10-CM | POA: Diagnosis not present

## 2020-09-20 DIAGNOSIS — I132 Hypertensive heart and chronic kidney disease with heart failure and with stage 5 chronic kidney disease, or end stage renal disease: Secondary | ICD-10-CM | POA: Diagnosis not present

## 2020-09-20 DIAGNOSIS — I4819 Other persistent atrial fibrillation: Secondary | ICD-10-CM | POA: Diagnosis not present

## 2020-09-20 DIAGNOSIS — I480 Paroxysmal atrial fibrillation: Secondary | ICD-10-CM | POA: Diagnosis not present

## 2020-09-20 DIAGNOSIS — Z6834 Body mass index (BMI) 34.0-34.9, adult: Secondary | ICD-10-CM

## 2020-09-20 DIAGNOSIS — E8770 Fluid overload, unspecified: Secondary | ICD-10-CM | POA: Diagnosis not present

## 2020-09-20 DIAGNOSIS — R0602 Shortness of breath: Secondary | ICD-10-CM | POA: Diagnosis not present

## 2020-09-20 DIAGNOSIS — N186 End stage renal disease: Secondary | ICD-10-CM | POA: Diagnosis not present

## 2020-09-20 DIAGNOSIS — R0902 Hypoxemia: Secondary | ICD-10-CM | POA: Diagnosis not present

## 2020-09-20 DIAGNOSIS — Z20822 Contact with and (suspected) exposure to covid-19: Secondary | ICD-10-CM | POA: Diagnosis not present

## 2020-09-20 DIAGNOSIS — E669 Obesity, unspecified: Secondary | ICD-10-CM | POA: Diagnosis present

## 2020-09-20 LAB — RESP PANEL BY RT-PCR (FLU A&B, COVID) ARPGX2
Influenza A by PCR: NEGATIVE
Influenza B by PCR: NEGATIVE
SARS Coronavirus 2 by RT PCR: NEGATIVE

## 2020-09-20 LAB — CBC
HCT: 36.6 % (ref 36.0–46.0)
Hemoglobin: 10.9 g/dL — ABNORMAL LOW (ref 12.0–15.0)
MCH: 28.5 pg (ref 26.0–34.0)
MCHC: 29.8 g/dL — ABNORMAL LOW (ref 30.0–36.0)
MCV: 95.8 fL (ref 80.0–100.0)
Platelets: 242 10*3/uL (ref 150–400)
RBC: 3.82 MIL/uL — ABNORMAL LOW (ref 3.87–5.11)
RDW: 17.3 % — ABNORMAL HIGH (ref 11.5–15.5)
WBC: 10.9 10*3/uL — ABNORMAL HIGH (ref 4.0–10.5)
nRBC: 0 % (ref 0.0–0.2)

## 2020-09-20 LAB — MAGNESIUM: Magnesium: 2 mg/dL (ref 1.7–2.4)

## 2020-09-20 LAB — COMPREHENSIVE METABOLIC PANEL
ALT: 16 U/L (ref 0–44)
AST: 20 U/L (ref 15–41)
Albumin: 3.6 g/dL (ref 3.5–5.0)
Alkaline Phosphatase: 61 U/L (ref 38–126)
Anion gap: 17 — ABNORMAL HIGH (ref 5–15)
BUN: 57 mg/dL — ABNORMAL HIGH (ref 6–20)
CO2: 25 mmol/L (ref 22–32)
Calcium: 8.7 mg/dL — ABNORMAL LOW (ref 8.9–10.3)
Chloride: 96 mmol/L — ABNORMAL LOW (ref 98–111)
Creatinine, Ser: 13.25 mg/dL — ABNORMAL HIGH (ref 0.44–1.00)
GFR, Estimated: 3 mL/min — ABNORMAL LOW (ref >60)
Glucose, Bld: 115 mg/dL — ABNORMAL HIGH (ref 70–99)
Potassium: 5.2 mmol/L — ABNORMAL HIGH (ref 3.5–5.1)
Sodium: 138 mmol/L (ref 135–145)
Total Bilirubin: 0.5 mg/dL (ref 0.3–1.2)
Total Protein: 8.4 g/dL — ABNORMAL HIGH (ref 6.5–8.1)

## 2020-09-20 MED ORDER — ACETAMINOPHEN 325 MG PO TABS: 650.0000 mg | ORAL_TABLET | Freq: Four times a day (QID) | ORAL | Status: DC | PRN

## 2020-09-20 MED ORDER — HYDRALAZINE HCL 20 MG/ML IJ SOLN
5.0000 mg | INTRAMUSCULAR | Status: DC | PRN
  Administered 2020-09-20: 5 mg via INTRAVENOUS

## 2020-09-20 MED ORDER — CINACALCET HCL 30 MG PO TABS
180.0000 mg | ORAL_TABLET | ORAL | Status: DC
  Administered 2020-09-20: 180 mg via ORAL
  Filled 2020-09-20 (×2): qty 6

## 2020-09-20 MED ORDER — ZOLPIDEM TARTRATE 5 MG PO TABS: 5.0000 mg | ORAL_TABLET | Freq: Every evening | ORAL | Status: DC | PRN

## 2020-09-20 MED ORDER — CLOPIDOGREL BISULFATE 75 MG PO TABS
75.0000 mg | ORAL_TABLET | Freq: Every day | ORAL | Status: DC
  Administered 2020-09-21: 75 mg via ORAL
  Filled 2020-09-20: qty 1

## 2020-09-20 MED ORDER — HYDRALAZINE HCL 20 MG/ML IJ SOLN: 10.0000 mg | INTRAMUSCULAR | Status: DC | PRN

## 2020-09-20 MED ORDER — ALBUTEROL SULFATE HFA 108 (90 BASE) MCG/ACT IN AERS
2.0000 | INHALATION_SPRAY | Freq: Once | RESPIRATORY_TRACT | Status: AC
  Administered 2020-09-20: 2 via RESPIRATORY_TRACT
  Filled 2020-09-20: qty 6.7

## 2020-09-20 MED ORDER — AMLODIPINE BESYLATE 10 MG PO TABS
10.0000 mg | ORAL_TABLET | Freq: Every day | ORAL | Status: DC
  Administered 2020-09-20 – 2020-09-21 (×2): 10 mg via ORAL
  Filled 2020-09-20 (×3): qty 1

## 2020-09-20 MED ORDER — HYDRALAZINE HCL 20 MG/ML IJ SOLN
INTRAMUSCULAR | Status: AC
  Filled 2020-09-20: qty 1

## 2020-09-20 MED ORDER — ONDANSETRON HCL 4 MG/2ML IJ SOLN: 4.0000 mg | Freq: Four times a day (QID) | INTRAMUSCULAR | Status: DC | PRN

## 2020-09-20 MED ORDER — NEPRO/CARBSTEADY PO LIQD
237.0000 mL | Freq: Three times a day (TID) | ORAL | Status: DC | PRN
  Filled 2020-09-20: qty 237

## 2020-09-20 MED ORDER — CALCITRIOL 0.5 MCG PO CAPS
3.7500 ug | ORAL_CAPSULE | ORAL | Status: DC
  Administered 2020-09-20: 3.75 ug via ORAL
  Filled 2020-09-20 (×2): qty 1

## 2020-09-20 MED ORDER — SEVELAMER CARBONATE 800 MG PO TABS: 1600.0000 mg | ORAL_TABLET | Freq: Three times a day (TID) | ORAL | Status: DC

## 2020-09-20 MED ORDER — HYDROXYZINE HCL 25 MG PO TABS: 25.0000 mg | ORAL_TABLET | Freq: Three times a day (TID) | ORAL | Status: DC | PRN

## 2020-09-20 MED ORDER — CARVEDILOL 25 MG PO TABS
25.0000 mg | ORAL_TABLET | Freq: Two times a day (BID) | ORAL | Status: DC
  Administered 2020-09-20 – 2020-09-21 (×2): 25 mg via ORAL
  Filled 2020-09-20 (×2): qty 1

## 2020-09-20 MED ORDER — CALCITRIOL 0.25 MCG PO CAPS
ORAL_CAPSULE | ORAL | Status: AC
  Filled 2020-09-20: qty 1

## 2020-09-20 MED ORDER — ONDANSETRON HCL 4 MG PO TABS: 4.0000 mg | ORAL_TABLET | Freq: Four times a day (QID) | ORAL | Status: DC | PRN

## 2020-09-20 MED ORDER — SODIUM CHLORIDE 0.9% FLUSH
3.0000 mL | Freq: Two times a day (BID) | INTRAVENOUS | Status: DC
  Administered 2020-09-20 (×2): 3 mL via INTRAVENOUS

## 2020-09-20 MED ORDER — DOCUSATE SODIUM 283 MG RE ENEM: 1.0000 | ENEMA | RECTAL | Status: DC | PRN

## 2020-09-20 MED ORDER — SEVELAMER CARBONATE 800 MG PO TABS
3200.0000 mg | ORAL_TABLET | Freq: Three times a day (TID) | ORAL | Status: DC
  Administered 2020-09-20 – 2020-09-21 (×3): 3200 mg via ORAL
  Filled 2020-09-20 (×3): qty 4

## 2020-09-20 MED ORDER — ASPIRIN 81 MG PO CHEW
81.0000 mg | CHEWABLE_TABLET | Freq: Every day | ORAL | Status: DC
  Administered 2020-09-20 – 2020-09-21 (×2): 81 mg via ORAL
  Filled 2020-09-20 (×2): qty 1

## 2020-09-20 MED ORDER — CALCITRIOL 0.5 MCG PO CAPS
ORAL_CAPSULE | ORAL | Status: AC
  Filled 2020-09-20: qty 7

## 2020-09-20 MED ORDER — APIXABAN 5 MG PO TABS
5.0000 mg | ORAL_TABLET | Freq: Two times a day (BID) | ORAL | Status: DC
  Administered 2020-09-20 – 2020-09-21 (×4): 5 mg via ORAL
  Filled 2020-09-20 (×4): qty 1

## 2020-09-20 MED ORDER — AMIODARONE HCL 200 MG PO TABS: 200.0000 mg | ORAL_TABLET | Freq: Every day | ORAL | Status: DC

## 2020-09-20 MED ORDER — HYDRALAZINE HCL 20 MG/ML IJ SOLN
INTRAMUSCULAR | Status: AC
  Administered 2020-09-20: 10 mg via INTRAVENOUS
  Filled 2020-09-20: qty 1

## 2020-09-20 MED ORDER — AMIODARONE HCL 200 MG PO TABS
400.0000 mg | ORAL_TABLET | Freq: Every day | ORAL | Status: DC
  Administered 2020-09-20 – 2020-09-21 (×2): 400 mg via ORAL
  Filled 2020-09-20 (×2): qty 2

## 2020-09-20 MED ORDER — RENA-VITE PO TABS
1.0000 | ORAL_TABLET | Freq: Every day | ORAL | Status: DC
  Administered 2020-09-20 – 2020-09-21 (×2): 1 via ORAL
  Filled 2020-09-20 (×3): qty 1

## 2020-09-20 MED ORDER — CALCIUM CARBONATE ANTACID 1250 MG/5ML PO SUSP
500.0000 mg | Freq: Four times a day (QID) | ORAL | Status: DC | PRN
  Filled 2020-09-20: qty 5

## 2020-09-20 MED ORDER — ACETAMINOPHEN 650 MG RE SUPP: 650.0000 mg | Freq: Four times a day (QID) | RECTAL | Status: DC | PRN

## 2020-09-20 MED ORDER — HEPARIN SODIUM (PORCINE) 1000 UNIT/ML DIALYSIS: 3000.0000 [IU] | Freq: Once | INTRAMUSCULAR | Status: DC

## 2020-09-20 MED ORDER — LORAZEPAM 2 MG/ML IJ SOLN
1.0000 mg | Freq: Once | INTRAMUSCULAR | Status: AC
  Administered 2020-09-20: 1 mg via INTRAVENOUS
  Filled 2020-09-20: qty 1

## 2020-09-20 MED ORDER — CHLORHEXIDINE GLUCONATE CLOTH 2 % EX PADS: 6.0000 | MEDICATED_PAD | Freq: Every day | CUTANEOUS | Status: DC

## 2020-09-20 MED ORDER — PANTOPRAZOLE SODIUM 40 MG PO TBEC
40.0000 mg | DELAYED_RELEASE_TABLET | Freq: Every day | ORAL | Status: DC
  Administered 2020-09-20 – 2020-09-21 (×2): 40 mg via ORAL
  Filled 2020-09-20 (×2): qty 1

## 2020-09-20 MED ORDER — CAMPHOR-MENTHOL 0.5-0.5 % EX LOTN: 1.0000 "application " | TOPICAL_LOTION | Freq: Three times a day (TID) | CUTANEOUS | Status: DC | PRN

## 2020-09-20 MED ORDER — ESCITALOPRAM OXALATE 10 MG PO TABS
10.0000 mg | ORAL_TABLET | Freq: Every day | ORAL | Status: DC
  Administered 2020-09-20 – 2020-09-21 (×2): 10 mg via ORAL
  Filled 2020-09-20 (×2): qty 1

## 2020-09-20 MED ORDER — ATORVASTATIN CALCIUM 80 MG PO TABS
80.0000 mg | ORAL_TABLET | Freq: Every day | ORAL | Status: DC
  Administered 2020-09-20: 80 mg via ORAL
  Filled 2020-09-20: qty 1

## 2020-09-20 MED ORDER — ALPRAZOLAM 0.5 MG PO TABS: 0.5000 mg | ORAL_TABLET | Freq: Every day | ORAL | Status: DC | PRN

## 2020-09-20 MED ORDER — SORBITOL 70 % SOLN: 30.0000 mL | Status: DC | PRN

## 2020-09-20 NOTE — ED Notes (Signed)
Dr. Lorin Mercy at bedside aware of patient's blood pressure.

## 2020-09-20 NOTE — H&P (Signed)
History and Physical    Erin Dean [id number] DOB: 1965-10-30 DOA: 09/20/2020  PCP: Lois Huxley, PA Consultants:  Nephrology;  Patient coming from:  Home - lives with granddaughter; NOK: Daughter, Floss Biondo, [phone]  Chief Complaint: SOB  HPI: Erin Dean is a 55 y.o. female with medical history significant of ESRD on TTS HD; CVA; SVT/afib; chronic diastolic CHF; and HTN presenting with SOB.  She reports that she went to HD last week on Tuesday and Thursday and had issues.  On 6/7, she had SVT with EMS coming and giving medication leading to resolution.  On 6/9, she had afib with RVR at HD and was cardioverted twice.  In the ER, she was in NSR and was loaded with Amio and discharged.  She did not have issues other than significant HTN on Saturday (6/11) at HD but she went home and slept the remainder of the day afterwards.  This AM, she was SOB upon awakening and felt like she was having a panic attack when it was time to go to HD and so she came to the ER instead.    ED Course: Fluid overloaded, HD patient.  Last went Saturday.  SOB, anxious this AM - afib with RVR to 150 on arrival and converted to NSR with Ativan, Albuterol.  Currently on 5L [place] O2.  Only wears O2 (2L) during HD.  Nephrology will dialyze today.  Likely needs observation overnight to ensure improvement.   Review of Systems: As per HPI; otherwise review of systems reviewed and negative.    Past Medical History:  Diagnosis Date   ESRD (end stage renal disease) ([place])    [facility] [place]    Hypertension    Stroke ([place]) 04/2017   no residual . Limp left side   SVT (supraventricular tachycardia) ([place])     Past Surgical History:  Procedure Laterality Date   AV FISTULA PLACEMENT Right 08/16/2017   Procedure: CREATION OF RADIOCEPHALIC VERSUS BRACHIOCEPHALIC ARTERIOVENOUS FISTULA RIGHT ARM;  Surgeon: Conrad Tuluksak, MD;  Location: [facility] OR;  Service: Vascular;  Laterality: Right;   AV FISTULA PLACEMENT  Right 02/07/2018   Procedure: Creation Right arm Brachiocephalic Fistula;  Surgeon: Marty Heck, MD;  Location: [place];  Service: Vascular;  Laterality: Right;   AV FISTULA PLACEMENT Left 08/15/2018   Procedure: ARTERIOVENOUS (AV) FISTULA CREATION;  Surgeon: Serafina Mitchell, MD;  Location: [place];  Service: Vascular;  Laterality: Left;   [place] Right 04/07/2018   Procedure: SECOND STAGE BASILIC VEIN TRANSPOSITION RIGHT ARM;  Surgeon: Marty Heck, MD;  Location: [place];  Service: Vascular;  Laterality: Right;   CESAREAN SECTION     CORONARY ATHERECTOMY N/A 06/22/2020   Procedure: CORONARY ATHERECTOMY;  Surgeon: Wellington Hampshire, MD;  Location: [place] CV LAB;  Service: Cardiovascular;  Laterality: N/A;   CORONARY STENT INTERVENTION N/A 06/22/2020   Procedure: CORONARY STENT INTERVENTION;  Surgeon: Wellington Hampshire, MD;  Location: [place] CV LAB;  Service: Cardiovascular;  Laterality: N/A;   ESOPHAGOGASTRODUODENOSCOPY N/A 04/19/2017   Procedure: ESOPHAGOGASTRODUODENOSCOPY (EGD);  Surgeon: Carol Ada, MD;  Location: [place];  Service: Endoscopy;  Laterality: N/A;   FISTULA SUPERFICIALIZATION Right 10/01/2017   Procedure: FISTULA SUPERFICIALIZATION RIGHT ARM;  Surgeon: Conrad Maywood, MD;  Location: [place];  Service: Vascular;  Laterality: Right;   FISTULA SUPERFICIALIZATION Left 10/08/2018   Procedure: FISTULA SUPERFICIALIZATION LEFT ARM;  Surgeon: Serafina Mitchell, MD;  Location: [place];  Service: Vascular;  Laterality: Left;   INTRAVASCULAR IMAGING/OCT N/A 06/22/2020   Procedure: INTRAVASCULAR IMAGING/OCT;  Surgeon: Wellington Hampshire, MD;  Location: [place] CV LAB;  Service: Cardiovascular;  Laterality: N/A;   IR FLUORO GUIDE CV LINE RIGHT  04/23/2018   IR US GUIDE VASC ACCESS RIGHT  04/23/2018   LEFT HEART CATH AND CORONARY ANGIOGRAPHY N/A 06/20/2020   Procedure: LEFT HEART CATH AND CORONARY ANGIOGRAPHY;  Surgeon: Nelva Bush, MD;  Location: [place] [facility];  Service: Cardiovascular;  Laterality: N/A;   LOOP RECORDER INSERTION N/A 04/22/2017   Procedure: LOOP RECORDER INSERTION;  Surgeon: Constance Haw, MD;  Location: [place] [facility];  Service: Cardiovascular;  Laterality: N/A;   RADIOLOGY WITH ANESTHESIA N/A 04/01/2020   Procedure: MRI W/O CONSTRAST  WITH ANESTHESIA;  Surgeon: Radiologist, Medication, MD;  Location: [place];  Service: Radiology;  Laterality: N/A;   TEE WITHOUT CARDIOVERSION N/A 04/19/2017   Procedure: TRANSESOPHAGEAL ECHOCARDIOGRAM (TEE);  Surgeon: Carol Ada, MD;  Location: [place];  Service: Endoscopy;  Laterality: N/A;    Social History   Socioeconomic History   Marital status: Legally Separated    Spouse name: Not on file   Number of children: Not on file   Years of education: Not on file   Highest education level: Not on file  Occupational History   Not on file  Tobacco Use   Smoking status: Never   Smokeless tobacco: Never  Vaping Use   Vaping Use: Never used  Substance and Sexual Activity   Alcohol use: No   Drug use: No   Sexual activity: Not on file  Other Topics Concern   Not on file  Social History Narrative   Lives with daughter   Education- high school   On temp disability   Social Determinants of Health   Financial Resource Strain: Not on file  Food Insecurity: Not on file  Transportation Needs: Not on file  Physical Activity: Not on file  Stress: Not on file  Social Connections: Not on file  Intimate Partner Violence: Not on file    Allergies  Allergen Reactions   Chlorhexidine Gluconate Rash    Family History  Problem Relation Age of Onset   Heart attack Maternal Uncle    Heart attack Maternal Grandfather    Stroke Paternal Grandmother    Cancer Father    Hypertension Sister     Prior to Admission medications   Medication Sig Start Date End Date Taking? Authorizing Provider  acetaminophen (TYLENOL) 325 MG tablet Take 2 tablets (650 mg total) by  mouth every 6 (six) hours as needed for mild pain (or Fever >/= 101). 04/03/20  Yes Elgergawy, Silver Huguenin, MD  ALPRAZolam Duanne Moron) 0.5 MG tablet Take 0.5 mg by mouth daily as needed for anxiety.  07/16/18  Yes [provider]  amiodarone (PACERONE) 200 MG tablet Take 1 tablet (200 mg total) by mouth daily. For the first 7 days, take amiodarone '400mg'$  (2 tablets) twice daily for 7 days then '200mg'$  daily thereafter. 09/13/20  Yes Lucrezia Starch, MD  amLODipine (NORVASC) 10 MG tablet Take 1 tablet (10 mg total) by mouth daily. 04/05/20  Yes Elgergawy, Silver Huguenin, MD  apixaban (ELIQUIS) 5 MG TABS tablet Take 1 tablet (5 mg total) by mouth 2 (two) times daily. 06/23/20  Yes Nita Sells, MD  aspirin 81 MG chewable tablet Chew 1 tablet (81 mg total) by mouth daily. 06/23/20  Yes Nita Sells, MD  atorvastatin (LIPITOR) 80 MG tablet Take  1 tablet (80 mg total) by mouth daily at 6 PM. 04/03/20  Yes Elgergawy, Silver Huguenin, MD  calcitRIOL (ROCALTROL) 0.25 MCG capsule Take 1 capsule (0.25 mcg total) by mouth daily. 03/11/18  Yes Donzetta Starch, NP  carvedilol (COREG) 25 MG tablet Take 1 tablet (25 mg total) by mouth 2 (two) times daily with a meal. 03/11/18  Yes Donzetta Starch, NP  Cinacalcet HCl (SENSIPAR PO) Take 1 tablet by mouth See admin instructions. Given at dialysis. Tuesday, Thursday, and Saturday. 10/31/19 10/29/20 Yes [provider]  clopidogrel (PLAVIX) 75 MG tablet Take 1 tablet (75 mg total) by mouth daily before breakfast. 06/24/20  Yes Nita Sells, MD  cyanocobalamin 1000 MCG tablet Take 1 tablet (1,000 mcg total) by mouth daily. 03/11/18  Yes Donzetta Starch, NP  escitalopram (LEXAPRO) 10 MG tablet Take 1 tablet (10 mg total) by mouth daily. 03/11/18  Yes Donzetta Starch, NP  ferrous sulfate 325 (65 FE) MG tablet Take 1 tablet (325 mg total) by mouth 3 (three) times daily with meals. 03/11/18  Yes Donzetta Starch, NP  multivitamin (RENA-VIT) TABS tablet Take 1 tablet by mouth  daily. 08/30/20  Yes [provider]  nitroGLYCERIN (NITROSTAT) 0.4 MG SL tablet Place 0.4 mg under the tongue every 5 (five) minutes x 3 doses as needed for chest pain. 05/18/20  Yes [provider]  omeprazole (PRILOSEC) 20 MG capsule Take 20 mg by mouth daily. 02/12/19  Yes [provider]  RENAGEL 800 MG tablet Take 1,600-3,200 mg by mouth See admin instructions. 3200  mg with meals 1600 mg with snacks 07/23/18  Yes [provider]  sertraline (ZOLOFT) 25 MG tablet Take 25 mg by mouth daily. 09/01/20  Yes [provider]    Physical Exam: Vitals:   09/20/20 1511 09/20/20 1522 09/20/20 1537 09/20/20 1552  BP: (!) 193/99 (!) 185/106 (!) 164/86 (!) 172/94  Pulse: 62 67 66 73  Resp: 20   15  Temp: 97.8 F (36.6 C)   98.4 F (36.9 C)  TempSrc: Oral   Oral  SpO2: 100%   94%     General:  Appears calm and comfortable and is in NAD, mildly anxious Eyes:  PERRL, EOMI, normal lids, iris ENT:  grossly normal hearing, lips & tongue, mmm Neck:  no LAD, masses or thyromegaly Cardiovascular:  RRR, no m/r/g. No LE edema.  Respiratory:   CTA bilaterally with no wheezes/rales/rhonchi.  Normal respiratory effort. Abdomen:  soft, NT, ND Skin:  no rash or induration seen on limited exam Musculoskeletal:  grossly normal tone BUE/BLE, good ROM, no bony abnormality Psychiatric:  mildly anxious mood and affect, speech fluent and appropriate, AOx3 Neurologic:  CN 2-12 grossly intact, moves all extremities in coordinated fashion    Radiological Exams on Admission: Independently reviewed - see discussion in A/P where applicable  DG Chest Port 1 View  Result Date: 09/20/2020 CLINICAL DATA:  Shortness of breath and cough for 2 days. EXAM: PORTABLE CHEST 1 VIEW COMPARISON:  09/06/2020 FINDINGS: 0704 hours. The cardio pericardial silhouette is enlarged. Diffuse interstitial and hazy airspace disease bilaterally suggest pulmonary edema. No substantial pleural  effusion. The visualized bony structures of the thorax show no acute abnormality. Telemetry leads overlie the chest. IMPRESSION: Cardiomegaly with features suggesting pulmonary edema. Electronically Signed   By: Misty Stanley M.D.   On: 09/20/2020 07:35    EKG: Independently reviewed.   0630 - Aflutter with rate 157 0720 - NSR with rate 78;  LVH with no evidence of acute ischemia   Labs on Admission: I have personally reviewed the available labs and imaging studies at the time of the admission.  Pertinent labs:   K+ 5.2 Glucose 115 BUN 57/Creatinine 13.25/GFR 3 Anion gap 17 WBC 10.9   Assessment/Plan Principal Problem:   Volume overload Active Problems:   Anxiety   Essential hypertension   Obesity   Hyperlipidemia   ESRD (end stage renal disease) (H[facility])   Chronic diastolic (congestive) heart failure (HCC)   PAF (paroxysmal atrial fibrillation) (H[facility])    Volume overload in an ESRD on HD patient -May be related to recent issues with arrhythmias -She has not missed HD - perhaps sessions were cut short last week? -CXR with pulmonary edema -She is likely to benefit from serial HD both today and tomorrow and may be appropriate for d/c to home tomorrow after HD -Patient on chronic TTS HD -Nephrology prn order set utilized -Nephrology is aware that patient will need HD today -Continue Calcitriol, Sensipar, Renavite, Renagel  SVT/Afib -Recent arrhythmia issues occurring at HD twice last week (but not Saturday) -She required EMS medications on 6/5 and cardioversion x 2 on 6/7 -As a result, she is appropriately anxious about attending HD and so may have had a panic attack this AM -She was in afib with RVR on presentation but converted to NSR -Will request that cardiology evaluate -Continue Amiodarone, Coreg -Continue Eliquis, ASA, Plavix  Anxiety -RVR can induce symptoms of panic -Her anxiety is at least as likely related to CV situation and recent instability as to psychogenic  causes -For now, will continue Xanax, Lexapro -Of note, she appeared to be taking both Lexapro and Zoloft and so Zoloft is being held.  She may need increased dosing of one or the other, but not both medications.  HTN -Continue Norvasc, Coreg  HLD -Continue Lipitor  Chronic diastolic CHF -Volume control with HD  Obesity -BMI 34.7 -Weight loss should be encouraged -Outpatient PCP/bariatric medicine f/u encouraged       Note: This patient has been tested and is negative for the novel coronavirus COVID-19.    Level of care: Telemetry Medical DVT prophylaxis: Eliquis Code Status:  Full - confirmed with patient Family Communication: None present Disposition Plan:  The patient is from: home  Anticipated d/c is to: home without [facility] services   Anticipated d/c date will depend on clinical response to treatment, but possibly as early as tomorrow if she has excellent response to treatment  Patient is currently: acutely ill Consults called: Cardiology; Nephrology  Admission status:  It is my clinical opinion that referral for OBSERVATION is reasonable and necessary in this patient based on the above information provided. The aforementioned taken together are felt to place the patient at high risk for further clinical deterioration. However it is anticipated that the patient may be medically stable for discharge from the hospital within 24 to 48 hours.    Karmen Bongo MD [facility]   How to contact the [facility] Attending or Consulting provider [place] or covering provider during after hours [place], for this patient?  Check the care team in [facility] and look for a) attending/consulting [facility] provider listed and b) the [facility] team listed Log into [email or web address] and use [place]'s universal password to access. If you do not have the password, please contact the hospital operator. Locate the [facility] provider you are looking for under [facility] and page to a number that you can be  directly  reached. If you still have difficulty reaching the provider, please page the [facility] (Director on Call) for the Hospitalists listed on amion for assistance.   09/20/2020, 5:21 PM

## 2020-09-20 NOTE — ED Notes (Signed)
Notified Dr. Regenia Skeeter of patient now in NS with rates in the 70s-80s.

## 2020-09-20 NOTE — Consult Note (Addendum)
Cardiology Consultation:   Patient ID: Erin Dean MRN: [id number]; DOB: 04-07-1966  Admit date: 09/20/2020 Date of Consult: 09/20/2020  PCP:  Erin Huxley, PA   [facility] Providers Cardiologist:  Skeet Latch, MD    Patient Profile:   Erin Dean is a 55 y.o. female with a hx of ESRF on HD, malignant HTN, obesity, chronic CHF (diastolic), stroke ([id number] and 2021), SVT, AFib, CAD, some degree of noncompliance who is being seen 09/20/2020 for the evaluation of recurrent Afib/RVR, SVTs at the request of Dr. Lorin Mercy.  History of Present Illness:   [name] has been seen by cardiology in the past and frequently of late noting  periods of noncompliance with some issues with getting medications from pharmacy.  --She was seen by Dr. Irish Lack in 2017 who encouraged compliance, lost to follow up until --Jan 2019 when she was admitted with CVA and GI bleed in the setting of hypertensive urgency. She had an elevated troponin felt due to demand ischemia. Pulmonary edema treated with lasix. TEE negative for source of stroke. Echo with preserved EF and grade 2 DD. She was discharged with ILR.  --She had another stroke 03/2020 while on plavix, also found to be COVID positive. Echo with possible mobile echodensity on AV left coronary cusp. TEE deferred due to [place]. She was placed on brilinta.  +++ TEE was not completed OP and she did not follow up.  -- CareEverywhere with appt for possible renal transplant, but "no show" appointments.  -- -- She was hospitalized 06/2020 with chest pain found to be in Afib RVR. She converted on her own prior to medications. CP resolved with conversion to NSR. She was anticoagulated. Given chest pain and EKG changes, she underwent myoview which was high risk. Planned for TEE inpatient, but pt refused sedation. TTE reviewed and felt images were more consistent with aortic valve sclerosis and not vegetation. TEE was canceled.  She underwent heart cath 06/20/20  that showed 95% ostial-proximal LAD stenosis followed by 50% stenosis, 60% distal Cx, and mild disease in the RCA. Given her co-morbid conditions and refusal of general anesthesia, not a favorable candidate for CABG. She had orbital atherectomy and DES to ostial-proximal LAD on 06/22/20. She was on triple therapy for 1 week, then on plavix and eliquis.  -- She was seen back in the ER on 09/06/20 with Afib RVR. When EMS arrived, HR 180-200 and she was given 20 mg IV cardizem. She converted to sinus rhythm and continued to be in sinus in the ER. She was discharged without admission. -- she returned 09/13/20 with palpitations at HD. She was lightheaded, short of breath, and diaphoretic. Without sedation EMS cardioverted once, but reverted back to Afib. She was cardioverted a second time and has remained in sinus rhythm since. On arrival to ER, she remains in sinus rhythm with PVC and PACs. Cardiology consulted for further management and started on amiodarone '400mg'$  bid x7 days then '200mg'$  daily   She comes today c/o SOB, anxious reporting a panic attack and w/palpitations, skipped HD did not think she could tolerate and came to the ER Nephrology notes CXR suggestive of pulmonary edema in setting of missed and truncated HD treatments. Last HD 09/17/20 signed off 15 minutes early, left 1.5 kg above OP EDW She arrived in Afib given lorazepam and had spontaneous conversion to SR.  She remained hypoxic, with persistent SOB and hypoxia, and planned dialysis and admission  LABS K+ 5.2 BUN/Creat 57/12.25 Mag 2.0 WBC  10.9 H/H 10/36 Plts 242  BP 206/143   She is feeling much better Sat is 100% on RA, not SOB Maintaining SR 60's  She denies CP, says the tachycardia started about 05:30 this AM as she was getting ready to go to HD.  She can feeling racing makes her SOB and weak.SHe did not have CP No near syncope or syncope (ever) She reports compliance with her medicines, including amiodarone as was written,  today to be the 1st day of reduced dose of '200mg'$  daily  I will make it '400mg'$  daily for another 2 weeks then '200mg'$   She has an ILR though last transmission was in 2019 shortly after implant (Jan 2019) lost to f/u until this year it looks with cardiology in March    Past Medical History:  Diagnosis Date   ESRD (end stage renal disease) ([place])    TTHS [place]    Hypertension    Stroke ([place]) 04/2017   no residual . Limp left side   SVT (supraventricular tachycardia) ([place])     Past Surgical History:  Procedure Laterality Date   AV FISTULA PLACEMENT Right 08/16/2017   Procedure: CREATION OF RADIOCEPHALIC VERSUS BRACHIOCEPHALIC ARTERIOVENOUS FISTULA RIGHT ARM;  Surgeon: Conrad Brenton, MD;  Location: [place];  Service: Vascular;  Laterality: Right;   AV FISTULA PLACEMENT Right 02/07/2018   Procedure: Creation Right arm Brachiocephalic Fistula;  Surgeon: Marty Heck, MD;  Location: [place];  Service: Vascular;  Laterality: Right;   AV FISTULA PLACEMENT Left 08/15/2018   Procedure: ARTERIOVENOUS (AV) FISTULA CREATION;  Surgeon: Serafina Mitchell, MD;  Location: [place];  Service: Vascular;  Laterality: Left;   [place] Right 04/07/2018   Procedure: SECOND STAGE BASILIC VEIN TRANSPOSITION RIGHT ARM;  Surgeon: Marty Heck, MD;  Location: [place];  Service: Vascular;  Laterality: Right;   CESAREAN SECTION     CORONARY ATHERECTOMY N/A 06/22/2020   Procedure: CORONARY ATHERECTOMY;  Surgeon: Wellington Hampshire, MD;  Location: [place] [facility];  Service: Cardiovascular;  Laterality: N/A;   CORONARY STENT INTERVENTION N/A 06/22/2020   Procedure: CORONARY STENT INTERVENTION;  Surgeon: Wellington Hampshire, MD;  Location: [place] [facility];  Service: Cardiovascular;  Laterality: N/A;   ESOPHAGOGASTRODUODENOSCOPY N/A 04/19/2017   Procedure: ESOPHAGOGASTRODUODENOSCOPY (EGD);  Surgeon: Carol Ada, MD;  Location: [place];  Service: Endoscopy;  Laterality: N/A;   FISTULA  SUPERFICIALIZATION Right 10/01/2017   Procedure: FISTULA SUPERFICIALIZATION RIGHT ARM;  Surgeon: Conrad [place], MD;  Location: [place];  Service: Vascular;  Laterality: Right;   FISTULA SUPERFICIALIZATION Left 10/08/2018   Procedure: FISTULA SUPERFICIALIZATION LEFT ARM;  Surgeon: Serafina Mitchell, MD;  Location: [place];  Service: Vascular;  Laterality: Left;   INTRAVASCULAR IMAGING/OCT N/A 06/22/2020   Procedure: INTRAVASCULAR IMAGING/OCT;  Surgeon: Wellington Hampshire, MD;  Location: [place] [facility];  Service: Cardiovascular;  Laterality: N/A;   IR FLUORO GUIDE CV LINE RIGHT  04/23/2018   IR US GUIDE VASC ACCESS RIGHT  04/23/2018   LEFT HEART CATH AND CORONARY ANGIOGRAPHY N/A 06/20/2020   Procedure: LEFT HEART CATH AND CORONARY ANGIOGRAPHY;  Surgeon: Nelva Bush, MD;  Location: [place] [facility];  Service: Cardiovascular;  Laterality: N/A;   LOOP RECORDER INSERTION N/A 04/22/2017   Procedure: LOOP RECORDER INSERTION;  Surgeon: Constance Haw, MD;  Location: [place] [facility];  Service: Cardiovascular;  Laterality: N/A;   RADIOLOGY WITH ANESTHESIA N/A 04/01/2020   Procedure: MRI W/O CONSTRAST  WITH ANESTHESIA;  Surgeon: Radiologist, Medication, MD;  Location: [place];  Service: Radiology;  Laterality: N/A;   TEE WITHOUT CARDIOVERSION N/A 04/19/2017   Procedure: TRANSESOPHAGEAL ECHOCARDIOGRAM (TEE);  Surgeon: Carol Ada, MD;  Location: [place];  Service: Endoscopy;  Laterality: N/A;     Home Medications:  Prior to Admission medications   Medication Sig Start Date End Date Taking? Authorizing Provider  acetaminophen (TYLENOL) 325 MG tablet Take 2 tablets (650 mg total) by mouth every 6 (six) hours as needed for mild pain (or Fever >/= 101). 04/03/20  Yes Elgergawy, Silver Huguenin, MD  ALPRAZolam Duanne Moron) 0.5 MG tablet Take 0.5 mg by mouth daily as needed for anxiety.  07/16/18  Yes [provider]  amiodarone (PACERONE) 200 MG tablet Take 1 tablet (200 mg total) by mouth daily. For the  first 7 days, take amiodarone '400mg'$  (2 tablets) twice daily for 7 days then '200mg'$  daily thereafter. 09/13/20  Yes Lucrezia Starch, MD  amLODipine (NORVASC) 10 MG tablet Take 1 tablet (10 mg total) by mouth daily. 04/05/20  Yes Elgergawy, Silver Huguenin, MD  apixaban (ELIQUIS) 5 MG TABS tablet Take 1 tablet (5 mg total) by mouth 2 (two) times daily. 06/23/20  Yes Nita Sells, MD  aspirin 81 MG chewable tablet Chew 1 tablet (81 mg total) by mouth daily. 06/23/20  Yes Nita Sells, MD  atorvastatin (LIPITOR) 80 MG tablet Take 1 tablet (80 mg total) by mouth daily at 6 PM. 04/03/20  Yes Elgergawy, Silver Huguenin, MD  calcitRIOL (ROCALTROL) 0.25 MCG capsule Take 1 capsule (0.25 mcg total) by mouth daily. 03/11/18  Yes Donzetta Starch, NP  carvedilol (COREG) 25 MG tablet Take 1 tablet (25 mg total) by mouth 2 (two) times daily with a meal. 03/11/18  Yes Donzetta Starch, NP  Cinacalcet HCl (SENSIPAR PO) Take 1 tablet by mouth See admin instructions. Given at dialysis. Tuesday, Thursday, and Saturday. 10/31/19 10/29/20 Yes [provider]  clopidogrel (PLAVIX) 75 MG tablet Take 1 tablet (75 mg total) by mouth daily before breakfast. 06/24/20  Yes Nita Sells, MD  cyanocobalamin 1000 MCG tablet Take 1 tablet (1,000 mcg total) by mouth daily. 03/11/18  Yes Donzetta Starch, NP  escitalopram (LEXAPRO) 10 MG tablet Take 1 tablet (10 mg total) by mouth daily. 03/11/18  Yes Donzetta Starch, NP  ferrous sulfate 325 (65 FE) MG tablet Take 1 tablet (325 mg total) by mouth 3 (three) times daily with meals. 03/11/18  Yes Donzetta Starch, NP  multivitamin (RENA-VIT) TABS tablet Take 1 tablet by mouth daily. 08/30/20  Yes [provider]  nitroGLYCERIN (NITROSTAT) 0.4 MG SL tablet Place 0.4 mg under the tongue every 5 (five) minutes x 3 doses as needed for chest pain. 05/18/20  Yes [provider]  omeprazole (PRILOSEC) 20 MG capsule Take 20 mg by mouth daily. 02/12/19  Yes [provider]   RENAGEL 800 MG tablet Take 1,600-3,200 mg by mouth See admin instructions. 3200  mg with meals 1600 mg with snacks 07/23/18  Yes [provider]  sertraline (ZOLOFT) 25 MG tablet Take 25 mg by mouth daily. 09/01/20  Yes [provider]    Inpatient Medications: Scheduled Meds:  amiodarone  200 mg Oral Daily   amLODipine  10 mg Oral Daily   apixaban  5 mg Oral BID   aspirin  81 mg Oral Daily   atorvastatin  80 mg Oral q1800   calcitRIOL  3.75 mcg Oral Q T,Th,Sa-HD   carvedilol  25  mg Oral BID WC   Chlorhexidine Gluconate Cloth  6 each Topical Q0600   cinacalcet  180 mg Oral Q T,Th,Sa-HD   [START ON 09/21/2020] clopidogrel  75 mg Oral QAC breakfast   escitalopram  10 mg Oral Daily   heparin  3,000 Units Dialysis Once in dialysis   multivitamin  1 tablet Oral Daily   pantoprazole  40 mg Oral Daily   sevelamer carbonate  3,200 mg Oral TID WC   sodium chloride flush  3 mL Intravenous Q12H   Continuous Infusions:  PRN Meds: acetaminophen **OR** acetaminophen, ALPRAZolam, calcium carbonate (dosed in mg elemental calcium), camphor-menthol **AND** hydrOXYzine, docusate sodium, feeding supplement (NEPRO CARB STEADY), hydrALAZINE, ondansetron **OR** ondansetron (ZOFRAN) IV, sorbitol, zolpidem  Allergies:    Allergies  Allergen Reactions   Chlorhexidine Gluconate Rash    Social History:   Social History   Socioeconomic History   Marital status: Legally Separated    Spouse name: Not on file   Number of children: Not on file   Years of education: Not on file   Highest education level: Not on file  Occupational History   Not on file  Tobacco Use   Smoking status: Never   Smokeless tobacco: Never  Vaping Use   Vaping Use: Never used  Substance and Sexual Activity   Alcohol use: No   Drug use: No   Sexual activity: Not on file  Other Topics Concern   Not on file  Social History Narrative   Lives with daughter   Education- high school   On temp disability    Social Determinants of Health   Financial Resource Strain: Not on file  Food Insecurity: Not on file  Transportation Needs: Not on file  Physical Activity: Not on file  Stress: Not on file  Social Connections: Not on file  Intimate Partner Violence: Not on file    Family History:    Family History  Problem Relation Age of Onset   Heart attack Maternal Uncle    Heart attack Maternal Grandfather    Stroke Paternal Grandmother    Cancer Father    Hypertension Sister      ROS:  Please see the history of present illness.  All other ROS reviewed and negative.     Physical Exam/Data:   Vitals:   09/20/20 1230 09/20/20 1300 09/20/20 1315 09/20/20 1400  BP: (!) 195/111 (!) 181/109 (!) 198/107 (!) 199/111  Pulse: (!) 58 61 (!) 59 (!) 59  Resp:      Temp:      TempSrc:      SpO2:       No intake or output data in the 24 hours ending 09/20/20 1427 Last 3 Weights 09/06/2020 06/23/2020 06/23/2020  Weight (lbs) 228 lb 244 lb 14.9 oz 249 lb 5.4 oz  Weight (kg) 103.42 kg 111.1 kg 113.1 kg     There is no height or weight on file to calculate BMI.  General:  Well nourished, well developed, in no acute distress getting HD HEENT: normal Lymph: no adenopathy Neck: no JVD Endocrine:  No thryomegaly Vascular: No carotid bruits  Cardiac:  RRR; no murmurs, gallops or rubs Lungs:  CTA b/l (ant auscultation), no wheezing, rhonchi or rales  Abd: soft, nontender, obese  Ext: no edema Musculoskeletal:  No deformities Skin: warm and dry  Neuro:  no focal abnormalities noted Psych:  Normal affect   EKG:  The EKG was personally reviewed and demonstrates:    SVT 157bpm, irregular  likely AFib w/RVR SR 75bpm,  SR 78bpm, no ischemic looking changes  Telemetry:  Telemetry was personally reviewed and demonstrates:   SR 60's  Relevant CV Studies:   06/19/20: TTE IMPRESSIONS   1. The aortic valve is tricuspid and the leaflets are moderately  calcified. There is trivial aortic  regurgitation. There is no apparent  vegetation on the current study. It appears to just be calcium on the  valve. Additionally, there is no significant  regurgitation, which goes against a destructive lesion such as  endocarditis. If there are clinical concerns for endocarditis, would  recommend a TEE. The aortic valve is tricuspid. There is moderate  calcification of the aortic valve. There is moderate  thickening of the aortic valve. Aortic valve regurgitation is trivial.  Mild to moderate aortic valve sclerosis/calcification is present, without  any evidence of aortic stenosis.   2. Left ventricular ejection fraction, by estimation, is 60 to 65%. The  left ventricle has normal function. The left ventricle has no regional  wall motion abnormalities.   3. A small pericardial effusion is present. The pericardial effusion is  circumferential.   4. The mitral valve is degenerative. Mild mitral valve regurgitation. No  evidence of mitral stenosis.   5. There is mildly elevated pulmonary artery systolic pressure.   04/02/20; TTE IMPRESSIONS   1. When compared to the prior study from 03/10/2018 aortc valve is now  thicker with a possible mobile echodensity attached to the left coronary  cusp. A TEE is recommended for further evaluation.   2. Left ventricular ejection fraction, by estimation, is 65 to 70%. The  left ventricle has normal function. The left ventricle has no regional  wall motion abnormalities. Left ventricular diastolic parameters are  consistent with Grade II diastolic  dysfunction (pseudonormalization). Elevated left atrial pressure.   3. Right ventricular systolic function is normal. The right ventricular  size is normal. There is mildly elevated pulmonary artery systolic  pressure.   4. Left atrial size was mildly dilated.   5. The mitral valve is normal in structure. No evidence of mitral valve  regurgitation. No evidence of mitral stenosis.   6. The aortic valve is  normal in structure. There is mild calcification  of the aortic valve. There is severe thickening of the aortic valve.  Aortic valve regurgitation is mild. No aortic stenosis is present.   7. The inferior vena cava is normal in size with greater than 50%  respiratory variability, suggesting right atrial pressure of 3 mmHg.   Laboratory Data:  High Sensitivity Troponin:   Recent Labs  Lab 09/06/20 1157  TROPONINIHS 60*     Chemistry Recent Labs  Lab 09/20/20 0643  NA 138  K 5.2*  CL 96*  CO2 25  GLUCOSE 115*  BUN 57*  CREATININE 13.25*  CALCIUM 8.7*  GFRNONAA 3*  ANIONGAP 17*    Recent Labs  Lab 09/20/20 0643  PROT 8.4*  ALBUMIN 3.6  AST 20  ALT 16  ALKPHOS 61  BILITOT 0.5   Hematology Recent Labs  Lab 09/20/20 0643  WBC 10.9*  RBC 3.82*  HGB 10.9*  HCT 36.6  MCV 95.8  MCH 28.5  MCHC 29.8*  RDW 17.3*  PLT 242   BNPNo results for input(s): BNP, PROBNP in the last 168 hours.  DDimer No results for input(s): DDIMER in the last 168 hours.   Radiology/Studies:  DG Chest Port 1 View  Result Date: 09/20/2020 CLINICAL DATA:  Shortness of breath and cough  for 2 days. EXAM: PORTABLE CHEST 1 VIEW COMPARISON:  09/06/2020 FINDINGS: 0704 hours. The cardio pericardial silhouette is enlarged. Diffuse interstitial and hazy airspace disease bilaterally suggest pulmonary edema. No substantial pleural effusion. The visualized bony structures of the thorax show no acute abnormality. Telemetry leads overlie the chest. IMPRESSION: Cardiomegaly with features suggesting pulmonary edema. Electronically Signed   By: Misty Stanley M.D.   On: 09/20/2020 07:35     Assessment and Plan:   Paroxysmal AFib CHA2DS2Vasc is 6, on Eliquis appropriately dosed She is maintaining SR Had spontaneous conversion after lorazepam was givne in the ER  She is feeling better Continue amiodarone at '400mg'$  daily for another 2 weeks She has cardiology f/u scheduled 10/03/20  Dr. Rayann Heman will see  her later this afternoon   2. CAD No anginal symptoms Reports compliance with her meds No sichemic looking EKG changes  3. HTN Improving Deferred to nephrology team   Risk Assessment/Risk Scores:   For questions or updates, please contact [facility] Please consult [email or web address] for contact info under    Signed, Baldwin Jamaica, PA-C  09/20/2020 2:27 PM.    I have seen, examined the patient, and reviewed the above assessment and plan.  Changes to above are made where necessary.  On exam, RRR.  The patient has symptomatic afib.  Treatment options are limited.  Continue oral amiodarone..  We will arrange follow-up in the AF clinic 3-5 days post discharge.  Co Sign: Thompson Grayer, MD 09/20/2020 9:29 PM

## 2020-09-20 NOTE — Discharge Instructions (Signed)

## 2020-09-20 NOTE — Progress Notes (Addendum)
Erin Dean is a 55 Y/O female with ESRD on hemodialysis T,Th,S  at [facility] who has been admitted as Observation patient for panic attackAFib RVR which converted to NSR with Lorazepam. CXR suggestive of pulmonary edema in setting of missed and truncated HD treatments. Last HD 09/17/20 signed off 15 minutes early, left 1.5 kg above OP EDW. Will manage HD today and consult formally if upgraded to in-patient.    HD orders: [facility] T,Th,S 3:45 hrs 180NRe 400/Autoflow 1.5 106.5 kg 2.0 K/ 2.0 Ca UFP 4  AVF -Heparin 3000 units IV initial bolus, Heparin 1500 units IV mid run -Sensipar 180 mg PO TIW -Mircera 30 mcg IV q 2 weeks (last dose 09/17/20) -Calcitriol 3.75 mcg PO TIW   Erin Dean [phone]  Pt seen, examined and agree w A/P as above.  Kelly Splinter  MD 09/20/2020, 3:56 PM

## 2020-09-20 NOTE — Plan of Care (Signed)

## 2020-09-20 NOTE — ED Triage Notes (Signed)
Pt arrives via EMS from home prior to scheduled dialysis, claims current anxiety attack, [facility] is due to previous visits to ED w SVT & Afib RVR/cardioversion. Previous visits inspired anxiety. Pt A&O4, tachy & hypertensive on arrival.

## 2020-09-20 NOTE — ED Provider Notes (Signed)
[facility] EMERGENCY DEPARTMENT Provider Note   CSN: [id number] Arrival date & time: 09/20/20  [id number]     History Chief Complaint  Patient presents with   Shortness of Breath    Erin Dean is a 55 y.o. female presenting for evaluation of shortness of breath.  Patient states she woke up this morning feeling short of breath, which caused her to feel very anxious and had a panic attack.  She states she can feel her heart is racing.  She did not feel she could go to dialysis today due to how she was feeling.  She reports frequent recent history of her heart going quickly requiring cardioversion with EMS.  Patient is extremely concerned that she will need to be cardioverted again today.  She reports a cough over the past couple days.  No associated fever or shortness of breath for today.  She is on Plavix and Eliquis, has been taking them as prescribed.  She goes to dialysis Tuesday, Thursday Saturday, last session was on Saturday and was normal for her.  She denies nausea, vomiting, abdominal pain, change in urination or abnormal bowel movements.  Additional history obtained from chart review.  History of stroke, hypertension, ESRD on dialysis, SVT, CHF, paroxysmal A. fib on anticoagulation, anxiety.  HPI     Past Medical History:  Diagnosis Date   ESRD (end stage renal disease) ([place])    TTHS [place]    Hypertension    Stroke ([place]) 04/2017   no residual . Limp left side   SVT (supraventricular tachycardia) ([place])     Patient Active Problem List   Diagnosis Date Noted   Volume overload 09/20/2020   Chronic diastolic CHF (congestive heart failure) (HCC)    Non-ST elevation (NSTEMI) myocardial infarction (HCC)    Abnormal stress test    Arrhythmia, atrial 06/18/2020   Elevated troponin 06/18/2020   Chest pain 06/18/2020   PAF (paroxysmal atrial fibrillation) ([place])    Atypical chest pain 06/17/2020   Acute CVA (cerebrovascular accident) ([place]) 03/31/2020    COVID-19 virus infection 03/31/2020   Chronic diastolic (congestive) heart failure ([place]) 03/31/2020   ESRD (end stage renal disease) ([place]) 05/06/2018   Hyperlipidemia 03/11/2018   Family hx-stroke 03/11/2018   CKD (chronic kidney disease) stage 5, GFR less than 15 ml/min (HCC) 07/31/2017   Cryptogenic stroke ([place])    Hypertensive emergency    Demand ischemia ([place])    Stroke-like episode ([place]) s/p tPA 04/17/2017   Essential hypertension 12/01/2015   Obesity 12/01/2015   Lower extremity edema 12/01/2015   Heart murmur 12/01/2015   Hypertensive urgency 09/22/2012   Anxiety 09/22/2012    Past Surgical History:  Procedure Laterality Date   AV FISTULA PLACEMENT Right 08/16/2017   Procedure: CREATION OF RADIOCEPHALIC VERSUS BRACHIOCEPHALIC ARTERIOVENOUS FISTULA RIGHT ARM;  Surgeon: Conrad [place], MD;  Location: [facility] OR;  Service: Vascular;  Laterality: Right;   AV FISTULA PLACEMENT Right 02/07/2018   Procedure: Creation Right arm Brachiocephalic Fistula;  Surgeon: Marty Heck, MD;  Location: [place];  Service: Vascular;  Laterality: Right;   AV FISTULA PLACEMENT Left 08/15/2018   Procedure: ARTERIOVENOUS (AV) FISTULA CREATION;  Surgeon: Serafina Mitchell, MD;  Location: [place];  Service: Vascular;  Laterality: Left;   [place] Right 04/07/2018   Procedure: SECOND STAGE BASILIC VEIN TRANSPOSITION RIGHT ARM;  Surgeon: Marty Heck, MD;  Location: [place];  Service: Vascular;  Laterality: Right;   CESAREAN SECTION  CORONARY ATHERECTOMY N/A 06/22/2020   Procedure: CORONARY ATHERECTOMY;  Surgeon: Wellington Hampshire, MD;  Location: [place] CV LAB;  Service: Cardiovascular;  Laterality: N/A;   CORONARY STENT INTERVENTION N/A 06/22/2020   Procedure: CORONARY STENT INTERVENTION;  Surgeon: Wellington Hampshire, MD;  Location: [place] CV LAB;  Service: Cardiovascular;  Laterality: N/A;   ESOPHAGOGASTRODUODENOSCOPY N/A 04/19/2017   Procedure: ESOPHAGOGASTRODUODENOSCOPY  (EGD);  Surgeon: Carol Ada, MD;  Location: [place];  Service: Endoscopy;  Laterality: N/A;   FISTULA SUPERFICIALIZATION Right 10/01/2017   Procedure: FISTULA SUPERFICIALIZATION RIGHT ARM;  Surgeon: Conrad Gray, MD;  Location: [place];  Service: Vascular;  Laterality: Right;   FISTULA SUPERFICIALIZATION Left 10/08/2018   Procedure: FISTULA SUPERFICIALIZATION LEFT ARM;  Surgeon: Serafina Mitchell, MD;  Location: [place];  Service: Vascular;  Laterality: Left;   INTRAVASCULAR IMAGING/OCT N/A 06/22/2020   Procedure: INTRAVASCULAR IMAGING/OCT;  Surgeon: Wellington Hampshire, MD;  Location: [place] CV LAB;  Service: Cardiovascular;  Laterality: N/A;   IR FLUORO GUIDE CV LINE RIGHT  04/23/2018   IR US GUIDE VASC ACCESS RIGHT  04/23/2018   LEFT HEART CATH AND CORONARY ANGIOGRAPHY N/A 06/20/2020   Procedure: LEFT HEART CATH AND CORONARY ANGIOGRAPHY;  Surgeon: Nelva Bush, MD;  Location: [place] CV LAB;  Service: Cardiovascular;  Laterality: N/A;   LOOP RECORDER INSERTION N/A 04/22/2017   Procedure: LOOP RECORDER INSERTION;  Surgeon: Constance Haw, MD;  Location: [place] CV LAB;  Service: Cardiovascular;  Laterality: N/A;   RADIOLOGY WITH ANESTHESIA N/A 04/01/2020   Procedure: MRI W/O CONSTRAST  WITH ANESTHESIA;  Surgeon: Radiologist, Medication, MD;  Location: [place];  Service: Radiology;  Laterality: N/A;   TEE WITHOUT CARDIOVERSION N/A 04/19/2017   Procedure: TRANSESOPHAGEAL ECHOCARDIOGRAM (TEE);  Surgeon: Carol Ada, MD;  Location: [place];  Service: Endoscopy;  Laterality: N/A;     OB History   No obstetric history on file.     Family History  Problem Relation Age of Onset   Heart attack Maternal Uncle    Heart attack Maternal Grandfather    Stroke Paternal Grandmother    Cancer Father    Hypertension Sister     Social History   Tobacco Use   Smoking status: Never   Smokeless tobacco: Never  Vaping Use   Vaping Use: Never used  Substance Use Topics    Alcohol use: No   Drug use: No    Home Medications Prior to Admission medications   Medication Sig Start Date End Date Taking? Authorizing Provider  acetaminophen (TYLENOL) 325 MG tablet Take 2 tablets (650 mg total) by mouth every 6 (six) hours as needed for mild pain (or Fever >/= 101). 04/03/20  Yes Elgergawy, Silver Huguenin, MD  ALPRAZolam Duanne Moron) 0.5 MG tablet Take 0.5 mg by mouth daily as needed for anxiety.  07/16/18  Yes [provider]  amiodarone (PACERONE) 200 MG tablet Take 1 tablet (200 mg total) by mouth daily. For the first 7 days, take amiodarone '400mg'$  (2 tablets) twice daily for 7 days then '200mg'$  daily thereafter. 09/13/20  Yes Lucrezia Starch, MD  amLODipine (NORVASC) 10 MG tablet Take 1 tablet (10 mg total) by mouth daily. 04/05/20  Yes Elgergawy, Silver Huguenin, MD  apixaban (ELIQUIS) 5 MG TABS tablet Take 1 tablet (5 mg total) by mouth 2 (two) times daily. 06/23/20  Yes Nita Sells, MD  aspirin 81 MG chewable tablet Chew 1 tablet (81 mg total) by mouth daily. 06/23/20  Yes Nita Sells,  MD  atorvastatin (LIPITOR) 80 MG tablet Take 1 tablet (80 mg total) by mouth daily at 6 PM. 04/03/20  Yes Elgergawy, Silver Huguenin, MD  calcitRIOL (ROCALTROL) 0.25 MCG capsule Take 1 capsule (0.25 mcg total) by mouth daily. 03/11/18  Yes Donzetta Starch, NP  carvedilol (COREG) 25 MG tablet Take 1 tablet (25 mg total) by mouth 2 (two) times daily with a meal. 03/11/18  Yes Donzetta Starch, NP  Cinacalcet HCl (SENSIPAR PO) Take 1 tablet by mouth See admin instructions. Given at dialysis. Tuesday, Thursday, and Saturday. 10/31/19 10/29/20 Yes [provider]  clopidogrel (PLAVIX) 75 MG tablet Take 1 tablet (75 mg total) by mouth daily before breakfast. 06/24/20  Yes Nita Sells, MD  cyanocobalamin 1000 MCG tablet Take 1 tablet (1,000 mcg total) by mouth daily. 03/11/18  Yes Donzetta Starch, NP  escitalopram (LEXAPRO) 10 MG tablet Take 1 tablet (10 mg total) by mouth daily. 03/11/18   Yes Donzetta Starch, NP  ferrous sulfate 325 (65 FE) MG tablet Take 1 tablet (325 mg total) by mouth 3 (three) times daily with meals. 03/11/18  Yes Donzetta Starch, NP  multivitamin (RENA-VIT) TABS tablet Take 1 tablet by mouth daily. 08/30/20  Yes [provider]  nitroGLYCERIN (NITROSTAT) 0.4 MG SL tablet Place 0.4 mg under the tongue every 5 (five) minutes x 3 doses as needed for chest pain. 05/18/20  Yes [provider]  omeprazole (PRILOSEC) 20 MG capsule Take 20 mg by mouth daily. 02/12/19  Yes [provider]  RENAGEL 800 MG tablet Take 1,600-3,200 mg by mouth See admin instructions. 3200  mg with meals 1600 mg with snacks 07/23/18  Yes [provider]  sertraline (ZOLOFT) 25 MG tablet Take 25 mg by mouth daily. 09/01/20  Yes [provider]    Allergies    Chlorhexidine gluconate  Review of Systems   Review of Systems  Respiratory:  Positive for cough and shortness of breath.   Hematological:  Bruises/bleeds easily.  Psychiatric/Behavioral:  The patient is nervous/anxious.   All other systems reviewed and are negative.  Physical Exam Updated Vital Signs BP (!) 188/104   Pulse 77   Temp 97.8 F (36.6 C) (Temporal)   Resp (!) 21   LMP 08/12/2017   SpO2 95%   Physical Exam Vitals and nursing note reviewed.  Constitutional:      General: She is not in acute distress.    Appearance: She is well-developed.     Comments: Appears anxious, but nontoxic  HENT:     Head: Normocephalic and atraumatic.  Eyes:     Conjunctiva/sclera: Conjunctivae normal.     Pupils: Pupils are equal, round, and reactive to light.  Cardiovascular:     Rate and Rhythm: Tachycardia present. Rhythm irregular.     Pulses: Normal pulses.     Comments: Tachycardic around 150.  Irregularly irregular. Pulmonary:     Effort: Pulmonary effort is normal. No respiratory distress.     Breath sounds: Wheezing and rhonchi present.     Comments: Expiratory wheezing,  mostly in the left upper and lower fields.  Wet lung sounds. Nonproductive cough noted on exam Abdominal:     General: There is no distension.     Palpations: Abdomen is soft. There is no mass.     Tenderness: There is no abdominal tenderness. There is no guarding or rebound.  Musculoskeletal:        General: Normal range of motion.  Cervical back: Normal range of motion and neck supple.     Right lower leg: No edema.     Left lower leg: No edema.     Comments: No pitting edema  Skin:    General: Skin is warm and dry.     Capillary Refill: Capillary refill takes less than 2 seconds.  Neurological:     Mental Status: She is alert and oriented to person, place, and time.  Psychiatric:        Mood and Affect: Mood is anxious.    ED Results / Procedures / Treatments   Labs (all labs ordered are listed, but only abnormal results are displayed) Labs Reviewed  CBC - Abnormal; Notable for the following components:      Result Value   WBC 10.9 (*)    RBC 3.82 (*)    Hemoglobin 10.9 (*)    MCHC 29.8 (*)    RDW 17.3 (*)    All other components within normal limits  COMPREHENSIVE METABOLIC PANEL - Abnormal; Notable for the following components:   Potassium 5.2 (*)    Chloride 96 (*)    Glucose, Bld 115 (*)    BUN 57 (*)    Creatinine, Ser 13.25 (*)    Calcium 8.7 (*)    Total Protein 8.4 (*)    GFR, Estimated 3 (*)    Anion gap 17 (*)    All other components within normal limits  RESP PANEL BY RT-PCR (FLU A&B, COVID) [id number]  MAGNESIUM    EKG EKG Interpretation  Date/Time:  Tuesday September 20 2020 07:20:22 EDT Ventricular Rate:  78 PR Interval:  215 QRS Duration: 117 QT Interval:  435 QTC Calculation: 496 R Axis:   60 Text Interpretation: Sinus rhythm Prolonged PR interval Probable left ventricular hypertrophy Borderline prolonged QT interval Confirmed by Sherwood Gambler [phone]) on 09/20/2020 7:44:51 AM  Radiology DG Chest Port 1 View  Result Date: 09/20/2020 CLINICAL  DATA:  Shortness of breath and cough for 2 days. EXAM: PORTABLE CHEST 1 VIEW COMPARISON:  09/06/2020 FINDINGS: 0704 hours. The cardio pericardial silhouette is enlarged. Diffuse interstitial and hazy airspace disease bilaterally suggest pulmonary edema. No substantial pleural effusion. The visualized bony structures of the thorax show no acute abnormality. Telemetry leads overlie the chest. IMPRESSION: Cardiomegaly with features suggesting pulmonary edema. Electronically Signed   By: Misty Stanley M.D.   On: 09/20/2020 07:35    Procedures .Critical Care  Date/Time: 09/20/2020 7:31 AM Performed by: Franchot Heidelberg, PA-C Authorized by: Franchot Heidelberg, PA-C   Critical care provider statement:    Critical care time (minutes):  45   Critical care time was exclusive of:  Separately billable procedures and treating other patients and teaching time   Critical care was necessary to treat or prevent imminent or life-threatening deterioration of the following conditions:  Circulatory failure and respiratory failure   Critical care was time spent personally by me on the following activities:  Blood draw for specimens, development of treatment plan with patient or surrogate, discussions with consultants, evaluation of patient's response to treatment, examination of patient, obtaining history from patient or surrogate, ordering and performing treatments and interventions, ordering and review of laboratory studies, ordering and review of radiographic studies, pulse oximetry, re-evaluation of patient's condition and review of old charts   I assumed direction of critical care for this patient from another provider in my specialty: no     Care discussed with: admitting provider   Comments:  Patient presenting with A. fib with RVR and shortness of breath.  Was able to convert to sinus rhythm, however requiring urgent dialysis and admission to the hospital for persistent hypoxia and shortness of breath.    Medications Ordered in ED Medications  Chlorhexidine Gluconate Cloth 2 % PADS 6 each (0 each Topical Hold 09/20/20 0903)  LORazepam (ATIVAN) injection 1 mg (1 mg Intravenous Given 09/20/20 0712)  albuterol (VENTOLIN HFA) 108 (90 Base) MCG/ACT inhaler 2 puff (2 puffs Inhalation Given 09/20/20 [id number])    ED Course  I have reviewed the triage vital signs and the nursing notes.  Pertinent labs & imaging results that were available during my care of the patient were reviewed by me and considered in my medical decision making (see chart for details).    MDM Rules/Calculators/A&P                          Patient presented for evaluation of shortness of breath anxiety.  On exam, patient appears anxious, but otherwise nontoxic.  She is tachycardic with an irregularly irregular heart rate consistent with A. fib.  She does report a cough of the past couple days, has wet sounding lung sounds with expiratory wheezing.  Concern for fluid overload versus pneumonia versus viral illness that is triggering this.  Will obtain labs, chest x-ray, EKG.  Chest x-ray viewed and independently interpreted by me, consistent with edema.  Per radiology, no obvious pneumonia.  EKG shows A. fib.  Labs overall reassuring, nonspecific leukocytosis of 10.9.  Hemoglobin low, but at baseline.  Potassium slightly elevated at 5.2, likely due to needing dialysis.  Patient converted to normal sinus rhythm after albuterol and Ativan.  Heart rate in the 70s.  Resting more comfortably.  She remains on oxygen, 5 L via nasal cannula.  Patient states she wears 2 L while doing dialysis, but no oxygen normally at baseline.  After converting to normal sinus rhythm, oxygen was lowered to 2 L and patient dropped to 86%.  As such, she will require more urgent dialysis and admission to the hospital.  Discussed with Dr. Jonnie Finner from nephrology, they will evaluate patient and plan on dialysis today.  Discussed with Dr. Lorin Mercy from [facility] hospitalist  service, pt to be admitted.    Final Clinical Impression(s) / ED Diagnoses Final diagnoses:  ESRD needing dialysis ([place])  Shortness of breath  Atrial fibrillation with RVR [facility])  Anxiety    Rx / DC Orders ED Discharge Orders     None        Franchot Heidelberg, PA-C 09/20/20 [id number]    Sherwood Gambler, MD 09/20/20 [phone]

## 2020-09-20 NOTE — Procedures (Signed)
   I was present at this dialysis session, have reviewed the session itself and made  appropriate changes Kelly Splinter MD [place] pager [phone]   09/20/2020, 3:55 PM

## 2020-09-21 DIAGNOSIS — N189 Chronic kidney disease, unspecified: Secondary | ICD-10-CM

## 2020-09-21 DIAGNOSIS — I10 Essential (primary) hypertension: Secondary | ICD-10-CM | POA: Diagnosis not present

## 2020-09-21 DIAGNOSIS — Z6835 Body mass index (BMI) 35.0-35.9, adult: Secondary | ICD-10-CM

## 2020-09-21 DIAGNOSIS — N186 End stage renal disease: Secondary | ICD-10-CM | POA: Diagnosis not present

## 2020-09-21 DIAGNOSIS — I4819 Other persistent atrial fibrillation: Secondary | ICD-10-CM | POA: Diagnosis not present

## 2020-09-21 DIAGNOSIS — F419 Anxiety disorder, unspecified: Secondary | ICD-10-CM | POA: Diagnosis not present

## 2020-09-21 DIAGNOSIS — E8770 Fluid overload, unspecified: Secondary | ICD-10-CM | POA: Diagnosis not present

## 2020-09-21 DIAGNOSIS — I4891 Unspecified atrial fibrillation: Secondary | ICD-10-CM

## 2020-09-21 DIAGNOSIS — D631 Anemia in chronic kidney disease: Secondary | ICD-10-CM

## 2020-09-21 DIAGNOSIS — I480 Paroxysmal atrial fibrillation: Secondary | ICD-10-CM | POA: Diagnosis not present

## 2020-09-21 DIAGNOSIS — I5033 Acute on chronic diastolic (congestive) heart failure: Secondary | ICD-10-CM | POA: Diagnosis not present

## 2020-09-21 LAB — CBC
HCT: 33 % — ABNORMAL LOW (ref 36.0–46.0)
Hemoglobin: 10.3 g/dL — ABNORMAL LOW (ref 12.0–15.0)
MCH: 29.3 pg (ref 26.0–34.0)
MCHC: 31.2 g/dL (ref 30.0–36.0)
MCV: 93.8 fL (ref 80.0–100.0)
Platelets: 208 10*3/uL (ref 150–400)
RBC: 3.52 MIL/uL — ABNORMAL LOW (ref 3.87–5.11)
RDW: 17.2 % — ABNORMAL HIGH (ref 11.5–15.5)
WBC: 8.6 10*3/uL (ref 4.0–10.5)
nRBC: 0 % (ref 0.0–0.2)

## 2020-09-21 LAB — BASIC METABOLIC PANEL
Anion gap: 13 (ref 5–15)
BUN: 29 mg/dL — ABNORMAL HIGH (ref 6–20)
CO2: 30 mmol/L (ref 22–32)
Calcium: 8.7 mg/dL — ABNORMAL LOW (ref 8.9–10.3)
Chloride: 95 mmol/L — ABNORMAL LOW (ref 98–111)
Creatinine, Ser: 8.77 mg/dL — ABNORMAL HIGH (ref 0.44–1.00)
GFR, Estimated: 5 mL/min — ABNORMAL LOW (ref >60)
Glucose, Bld: 105 mg/dL — ABNORMAL HIGH (ref 70–99)
Potassium: 5.5 mmol/L — ABNORMAL HIGH (ref 3.5–5.1)
Sodium: 138 mmol/L (ref 135–145)

## 2020-09-21 MED ORDER — AMIODARONE HCL 400 MG PO TABS: 400.0000 mg | ORAL_TABLET | Freq: Every day | ORAL | 0 refills | Status: DC

## 2020-09-21 NOTE — Progress Notes (Signed)
DISCHARGE NOTE    KEMIA SIRAK to be discharged Home per MD order. Patient verbalized understanding.  Skin clean, dry and intact without evidence of skin break down, no evidence of skin tears noted. IV catheter discontinued intact. Site without signs and symptoms of complications. Dressing and pressure applied. Pt denies pain at the site currently. No complaints noted.  Patient free of lines, drains, and wounds.   Discharge packet assembled. An After Visit Summary (AVS) was printed and given to the patient. Patient escorted via wheelchair and discharged to home via private auto. all questions and concerns addressed.   Babs Sciara, RN

## 2020-09-21 NOTE — Progress Notes (Addendum)
Progress Note  Patient Name: Erin Dean Date of Encounter: 09/21/2020  [facility] Cardiologist: Skeet Latch, MD   Subjective   Feels much better, no CP, palpitations or SOB  Inpatient Medications    Scheduled Meds:  amiodarone  400 mg Oral Daily   amLODipine  10 mg Oral Daily   apixaban  5 mg Oral BID   aspirin  81 mg Oral Daily   atorvastatin  80 mg Oral q1800   calcitRIOL  3.75 mcg Oral Q T,Th,Sa-HD   carvedilol  25 mg Oral BID WC   Chlorhexidine Gluconate Cloth  6 each Topical Q0600   cinacalcet  180 mg Oral Q T,Th,Sa-HD   clopidogrel  75 mg Oral QAC breakfast   escitalopram  10 mg Oral Daily   multivitamin  1 tablet Oral Daily   pantoprazole  40 mg Oral Daily   sevelamer carbonate  3,200 mg Oral TID WC   sodium chloride flush  3 mL Intravenous Q12H   Continuous Infusions:  PRN Meds: acetaminophen **OR** acetaminophen, ALPRAZolam, calcium carbonate (dosed in mg elemental calcium), camphor-menthol **AND** hydrOXYzine, docusate sodium, feeding supplement (NEPRO CARB STEADY), hydrALAZINE, ondansetron **OR** ondansetron (ZOFRAN) IV, sorbitol, zolpidem   Vital Signs    Vitals:   09/20/20 1552 09/20/20 2025 09/21/20 0632 09/21/20 0914  BP: (!) 172/94 (!) 158/79 (!) 175/81 (!) 159/82  Pulse: 73 65 (!) 57 (!) 58  Resp: '15 17 18 17  '$ Temp: 98.4 F (36.9 C) 98.5 F (36.9 C) 98.2 F (36.8 C) 98.3 F (36.8 C)  TempSrc: Oral Oral Oral   SpO2: 94% 93% 95% 93%    Intake/Output Summary (Last 24 hours) at 09/21/2020 1033 Last data filed at 09/21/2020 0800 Gross per 24 hour  Intake 543 ml  Output 3364 ml  Net -2821 ml   Last 3 Weights 09/06/2020 06/23/2020 06/23/2020  Weight (lbs) 228 lb 244 lb 14.9 oz 249 lb 5.4 oz  Weight (kg) 103.42 kg 111.1 kg 113.1 kg      Telemetry    SR 60's - Personally Reviewed  ECG    No new EKGs - Personally Reviewed  Physical Exam   GEN: No acute distress.   Neck: No JVD Cardiac: RRR, no murmurs, rubs, or gallops.   Respiratory: CTA b/l. GI: Soft, nontender, non-distended  MS: No edema; No deformity. Neuro:  Nonfocal  Psych: Normal affect   Labs    High Sensitivity Troponin:   Recent Labs  Lab 09/06/20 1157  TROPONINIHS 60*      Chemistry Recent Labs  Lab 09/20/20 0643 09/21/20 0444  NA 138 138  K 5.2* 5.5*  CL 96* 95*  CO2 25 30  GLUCOSE 115* 105*  BUN 57* 29*  CREATININE 13.25* 8.77*  CALCIUM 8.7* 8.7*  PROT 8.4*  --   ALBUMIN 3.6  --   AST 20  --   ALT 16  --   ALKPHOS 61  --   BILITOT 0.5  --   GFRNONAA 3* 5*  ANIONGAP 17* 13     Hematology Recent Labs  Lab 09/20/20 0643 09/21/20 0444  WBC 10.9* 8.6  RBC 3.82* 3.52*  HGB 10.9* 10.3*  HCT 36.6 33.0*  MCV 95.8 93.8  MCH 28.5 29.3  MCHC 29.8* 31.2  RDW 17.3* 17.2*  PLT 242 208    BNPNo results for input(s): BNP, PROBNP in the last 168 hours.   DDimer No results for input(s): DDIMER in the last 168 hours.   Radiology  DG Chest Port 1 View  Result Date: 09/20/2020 CLINICAL DATA:  Shortness of breath and cough for 2 days. EXAM: PORTABLE CHEST 1 VIEW COMPARISON:  09/06/2020 FINDINGS: 0704 hours. The cardio pericardial silhouette is enlarged. Diffuse interstitial and hazy airspace disease bilaterally suggest pulmonary edema. No substantial pleural effusion. The visualized bony structures of the thorax show no acute abnormality. Telemetry leads overlie the chest. IMPRESSION: Cardiomegaly with features suggesting pulmonary edema. Electronically Signed   By: Misty Stanley M.D.   On: 09/20/2020 07:35    Cardiac Studies     06/19/20: TTE IMPRESSIONS   1. The aortic valve is tricuspid and the leaflets are moderately  calcified. There is trivial aortic regurgitation. There is no apparent  vegetation on the current study. It appears to just be calcium on the  valve. Additionally, there is no significant  regurgitation, which goes against a destructive lesion such as  endocarditis. If there are clinical concerns  for endocarditis, would  recommend a TEE. The aortic valve is tricuspid. There is moderate  calcification of the aortic valve. There is moderate  thickening of the aortic valve. Aortic valve regurgitation is trivial.  Mild to moderate aortic valve sclerosis/calcification is present, without  any evidence of aortic stenosis.   2. Left ventricular ejection fraction, by estimation, is 60 to 65%. The  left ventricle has normal function. The left ventricle has no regional  wall motion abnormalities.   3. A small pericardial effusion is present. The pericardial effusion is  circumferential.   4. The mitral valve is degenerative. Mild mitral valve regurgitation. No  evidence of mitral stenosis.   5. There is mildly elevated pulmonary artery systolic pressure.    04/02/20; TTE IMPRESSIONS   1. When compared to the prior study from 03/10/2018 aortc valve is now  thicker with a possible mobile echodensity attached to the left coronary  cusp. A TEE is recommended for further evaluation.   2. Left ventricular ejection fraction, by estimation, is 65 to 70%. The  left ventricle has normal function. The left ventricle has no regional  wall motion abnormalities. Left ventricular diastolic parameters are  consistent with Grade II diastolic  dysfunction (pseudonormalization). Elevated left atrial pressure.   3. Right ventricular systolic function is normal. The right ventricular  size is normal. There is mildly elevated pulmonary artery systolic  pressure.   4. Left atrial size was mildly dilated.   5. The mitral valve is normal in structure. No evidence of mitral valve  regurgitation. No evidence of mitral stenosis.   6. The aortic valve is normal in structure. There is mild calcification  of the aortic valve. There is severe thickening of the aortic valve.  Aortic valve regurgitation is mild. No aortic stenosis is present.   7. The inferior vena cava is normal in size with greater than 50%   respiratory variability, suggesting right atrial pressure of 3 mmHg.  Patient Profile     55 y.o. female with a hx of ESRF on HD, malignant HTN, obesity, chronic CHF (diastolic), stroke ([id number] and 2021), SVT, AFib, CAD, some degree of noncompliance who was admitted with AFib RVR and volume OL  Assessment & Plan    Paroxysmal AFib CHA2DS2Vasc is 6, on Eliquis appropriately dosed She is maintaining SR since admission Had spontaneous conversion after lorazepam was givne in the ER  She is feeling much better s/p dialysis yesterday and in SR. Continue amiodarone '400mg'$  daily for now with plans to down titrate outpatient. (I have sent  the new prescription) Early follow up next week is in place with the Afib clinic, the patient is aware  She also has general cardiology follow up in place as well.  Dr. Rayann Heman has seen and examined the patient OK to discharge from EP perspective     For questions or updates, please contact [facility] Please consult [email or web address] for contact info under        Signed, Baldwin Jamaica, PA-C  09/21/2020, 10:33 AM      I have seen, examined the patient, and reviewed the above assessment and plan.  Changes to above are made where necessary.  On exam, RRR.  Now maintaining sinus with amiodarone.  No further inpatient EP workup planned. We will arrange AF clinic follow-up. Electrophysiology team to see as needed while here. Please call with questions.   Co Sign: Thompson Grayer, MD 09/21/2020 11:05 AM

## 2020-09-21 NOTE — Plan of Care (Signed)
  Problem: Education: Goal: Knowledge of disease and its progression will improve Outcome: Completed/Met   Problem: Fluid Volume: Goal: Compliance with measures to maintain balanced fluid volume will improve Outcome: Completed/Met   Problem: Health Behavior/Discharge Planning: Goal: Ability to manage health-related needs will improve Outcome: Completed/Met

## 2020-09-21 NOTE — Progress Notes (Signed)
Seen in room this AM. Wt 106.1 kg this AM. BP still elevated. Agrees to run extra treatment today and attempt to lower EDW at least 0.5 kg. K+ noted to be 5.5. Says she feels better but not quite to baseline. Believes she still have volume on board. Will run 3 hour sequential today.  Can be discharged home if stable post HD from nephrology stand point. Changed amlodipine dosing to evening. She is on oral amiodarone for PAF.    HD orders: [facility] T,Th,S 3:45 hrs 180NRe 400/Autoflow 1.5 106.5 kg 2.0 K/ 2.0 Ca UFP 4  AVF -Heparin 3000 units IV initial bolus, Heparin 1500 units IV mid run -Sensipar 180 mg PO TIW -Mircera 30 mcg IV q 2 weeks (last dose 09/17/20) -Calcitriol 3.75 mcg PO TIW    Juanell Fairly [facility] [phone]

## 2020-09-21 NOTE — Discharge Summary (Signed)
Physician Discharge Summary  Erin Dean [id number] DOB: 1965/05/19 DOA: 09/20/2020  PCP: Lois Huxley, PA  Admit date: 09/20/2020 Discharge date: 09/21/2020  Admitted From: Home Disposition: Home  Recommendations for Outpatient Follow-up:  Follow ups as below. Please obtain CBC/BMP/Mag at follow up Please follow up on the following pending results: None  Home Health: None required Equipment/Devices: None required  Discharge Condition: Stable CODE STATUS: Full code   Follow-up [facility] [place] Follow up.   Specialty: Cardiology Why: 09/26/20 @ 3:30PM with R. Marlene Lard, PA-C Contact information: [address] [id number] [place] [phone]        Lois Huxley, PA. Schedule an appointment as soon as possible for a visit in 1 week(s).   Specialty: Family Medicine Why: As needed Contact information: [place] [address] [phone]                 Hospital Course: 55 year old F with PMH of ESRD on HD TTS, CVA, SVT/A. fib, diastolic CHF and HTN presenting with shortness of breath and "panic attack" when it was time to go to HD.  Reportedly, patient had SVTs and A. fib with RVR at her last two HD's.  She was cardioverted twice when she was in RVR on 6/9.  Last HD on 6/11 was truncated  In ED, patient was hypotensive and fluid overloaded.  CXR with pulmonary edema.  She was in A. fib with RVR with heart rate 150s on arrival but converted to normal sinus rhythm with Ativan and albuterol.  Nephrology and cardiology consulted.  Consulted.  She had hemodialysis with ultrafiltration of 3.4 L.    The next day, symptoms resolved but blood pressure remained elevated.  She had another hemodialysis with ultrafiltration of 1.5 L.  Cardiology increased amiodarone and cleared patient for discharge with outpatient follow-up.   See individual problem list below for more on  hospital course.  Discharge Diagnoses:  Volume overload in an ESRD on HD patient Acute on chronic diastolic CHF -Had HD back-to-back with ultrafiltration of 3.4 L and 1.5 L    SVT/Afib with RVR: Converted to sinus rhythm. -Cleared for discharge by cardiology on amiodarone 400 mg daily and daily home Coreg. -She is on Eliquis for anticoagulation. -Cardiology arranged outpatient follow-up in A. fib clinic next week.  History of CVA: Stable. -Continue Plavix and aspirin    Anxiety/panic attack-recently switched from Lexapro to low-dose Zoloft. -Continue home medications. -Continue home Zoloft-this might need to be uptitrated by PCP for better control.  Uncontrolled hypertension: BP improved but not at goal yet. -Continue Norvasc, Coreg -Ultrafiltration with HD  Anemia of renal disease: Stable. -Defer to nephrology  Hyperkalemia -Correct with HD.   HLD -Continue Lipitor   Class II obesity: -BMI 35.53. -Encourage lifestyle change to lose weight.   There is no height or weight on file to calculate BMI.            Discharge Exam: Vitals:   09/21/20 0632 09/21/20 0914  BP: (!) 175/81 (!) 159/82  Pulse: (!) 57 (!) 58  Resp: 18 17  Temp: 98.2 F (36.8 C) 98.3 F (36.8 C)  SpO2: 95% 93%    GENERAL: No apparent distress.  Nontoxic. HEENT: MMM.  Vision and hearing grossly intact.  NECK: Supple.  No apparent JVD.  RESP: On RA.  No IWOB.  Fair aeration bilaterally. CVS:  RRR. Heart sounds normal.  aVF  with good bruits. ABD/GI/GU: Bowel sounds present. Soft. Non tender.  MSK/EXT:  Moves extremities. No apparent deformity. No edema.  SKIN: no apparent skin lesion or wound NEURO: Awake, alert and oriented appropriately.  No apparent focal neuro deficit. PSYCH: Calm. Normal affect.   Discharge Instructions  Discharge Instructions     Call MD for:  difficulty breathing, headache or visual disturbances   Complete by: As directed    Call MD for:  extreme fatigue    Complete by: As directed    Call MD for:  persistant dizziness or light-headedness   Complete by: As directed    Call MD for:  persistant nausea and vomiting   Complete by: As directed    Call MD for:  temperature >100.4   Complete by: As directed    Diet - low sodium heart healthy   Complete by: As directed    Discharge instructions   Complete by: As directed    It has been a pleasure taking care of you!  You were hospitalized with shortness of breath and atrial fibrillation (fast and irregular heartbeat).  Your breathing improved after dialysis and controlling your heart rate.  Cardiology adjusted you medication for your heart.  Please review your new medication list and the directions on your medications before you take them.  Cardiology will arrange outpatient follow-up.  You may follow-up with your primary care doctor in 1 to 2 weeks or sooner if needed.   Take care,   Increase activity slowly   Complete by: As directed       Allergies as of 09/21/2020       Reactions   Chlorhexidine Gluconate Rash        Medication List     STOP taking these medications    escitalopram 10 MG tablet Commonly known as: LEXAPRO       TAKE these medications    acetaminophen 325 MG tablet Commonly known as: TYLENOL Take 2 tablets (650 mg total) by mouth every 6 (six) hours as needed for mild pain (or Fever >/= 101).   ALPRAZolam 0.5 MG tablet Commonly known as: XANAX Take 0.5 mg by mouth daily as needed for anxiety.   amiodarone 400 MG tablet Commonly known as: Pacerone Take 1 tablet (400 mg total) by mouth daily. Start taking on: September 22, 2020 What changed:  medication strength how much to take additional instructions Notes to patient: MAKE SURE to follow up with the AFib clinic as we discussed for further management and reduction in dose of this medicine   amLODipine 10 MG tablet Commonly known as: NORVASC Take 1 tablet (10 mg total) by mouth daily.   apixaban 5 MG  Tabs tablet Commonly known as: ELIQUIS Take 1 tablet (5 mg total) by mouth 2 (two) times daily.   aspirin 81 MG chewable tablet Chew 1 tablet (81 mg total) by mouth daily.   atorvastatin 80 MG tablet Commonly known as: LIPITOR Take 1 tablet (80 mg total) by mouth daily at 6 PM.   calcitRIOL 0.25 MCG capsule Commonly known as: ROCALTROL Take 1 capsule (0.25 mcg total) by mouth daily.   carvedilol 25 MG tablet Commonly known as: COREG Take 1 tablet (25 mg total) by mouth 2 (two) times daily with a meal.   clopidogrel 75 MG tablet Commonly known as: PLAVIX Take 1 tablet (75 mg total) by mouth daily before breakfast.   cyanocobalamin 1000 MCG tablet Take 1 tablet (1,000 mcg total) by mouth daily.   ferrous  sulfate 325 (65 FE) MG tablet Take 1 tablet (325 mg total) by mouth 3 (three) times daily with meals.   multivitamin Tabs tablet Take 1 tablet by mouth daily.   nitroGLYCERIN 0.4 MG SL tablet Commonly known as: NITROSTAT Place 0.4 mg under the tongue every 5 (five) minutes x 3 doses as needed for chest pain.   omeprazole 20 MG capsule Commonly known as: PRILOSEC Take 20 mg by mouth daily.   Renagel 800 MG tablet Generic drug: sevelamer Take 1,600-3,200 mg by mouth See admin instructions. 3200  mg with meals 1600 mg with snacks   SENSIPAR PO Take 1 tablet by mouth See admin instructions. Given at dialysis. Tuesday, Thursday, and Saturday.   sertraline 25 MG tablet Commonly known as: ZOLOFT Take 25 mg by mouth daily.        Consultations: Cardiology Nephrology  Procedures/Studies: Hemodialysis on 6/14 and 6/15   [facility] Chest Port 1 View  Result Date: 09/20/2020 CLINICAL DATA:  Shortness of breath and cough for 2 days. EXAM: PORTABLE CHEST 1 VIEW COMPARISON:  09/06/2020 FINDINGS: 0704 hours. The cardio pericardial silhouette is enlarged. Diffuse interstitial and hazy airspace disease bilaterally suggest pulmonary edema. No substantial pleural effusion. The  visualized bony structures of the thorax show no acute abnormality. Telemetry leads overlie the chest. IMPRESSION: Cardiomegaly with features suggesting pulmonary edema. Electronically Signed   By: Misty Stanley M.D.   On: 09/20/2020 07:35   DG Chest Port 1 View  Result Date: 09/06/2020 CLINICAL DATA:  Short of breath EXAM: PORTABLE CHEST 1 VIEW COMPARISON:  06/17/2020 FINDINGS: Stable enlarged cardiac silhouette. Normal mediastinum. Normal pulmonary vasculature. No evidence of effusion, infiltrate, or pneumothorax. No acute bony abnormality. IMPRESSION: No acute cardiopulmonary process.  Cardiomegaly. Electronically Signed   By: Suzy Bouchard M.D.   On: 09/06/2020 12:00       The results of significant diagnostics from this hospitalization (including imaging, microbiology, ancillary and laboratory) are listed below for reference.     Microbiology: Recent Results (from the past 240 hour(s))  Resp Panel by RT-PCR (Flu A&B, Covid) Nasopharyngeal Swab     Status: None   Collection Time: 09/13/20  9:35 AM   Specimen: Nasopharyngeal Swab; Nasopharyngeal(NP) swabs in vial transport medium  Result Value Ref Range Status   SARS Coronavirus 2 by RT PCR NEGATIVE NEGATIVE Final    Comment: (NOTE) SARS-CoV-2 target nucleic acids are NOT DETECTED.  The SARS-CoV-2 RNA is generally detectable in upper respiratory specimens during the acute phase of infection. The lowest concentration of SARS-CoV-2 viral copies this assay can detect is 138 copies/mL. A negative result does not preclude SARS-Cov-2 infection and should not be used as the sole basis for treatment or other patient management decisions. A negative result may occur with  improper specimen collection/handling, submission of specimen other than nasopharyngeal swab, presence of viral mutation(s) within the areas targeted by this assay, and inadequate number of viral copies(<138 copies/mL). A negative result must be combined with clinical  observations, patient history, and epidemiological information. The expected result is Negative.  Fact Sheet for Patients:  EntrepreneurPulse.com.au  Fact Sheet for Healthcare Providers:  IncredibleEmployment.be  This test is no t yet approved or cleared by the [place] FDA and  has been authorized for detection and/or diagnosis of SARS-CoV-2 by FDA under an Emergency Use Authorization (EUA). This EUA will remain  in effect (meaning this test can be used) for the duration of the COVID-19 declaration under Section 564(b)(1) of the Act, 21 U.S.C.section  360bbb-3(b)(1), unless the authorization is terminated  or revoked sooner.       Influenza A by PCR NEGATIVE NEGATIVE Final   Influenza B by PCR NEGATIVE NEGATIVE Final    Comment: (NOTE) The Xpert Xpress SARS-CoV-2/FLU/RSV plus assay is intended as an aid in the diagnosis of influenza from Nasopharyngeal swab specimens and should not be used as a sole basis for treatment. Nasal washings and aspirates are unacceptable for Xpert Xpress SARS-CoV-2/FLU/RSV testing.  Fact Sheet for Patients: EntrepreneurPulse.com.au  Fact Sheet for Healthcare Providers: IncredibleEmployment.be  This test is not yet approved or cleared by the [place] FDA and has been authorized for detection and/or diagnosis of SARS-CoV-2 by FDA under an Emergency Use Authorization (EUA). This EUA will remain in effect (meaning this test can be used) for the duration of the COVID-19 declaration under Section 564(b)(1) of the Act, 21 U.S.C. section 360bbb-3(b)(1), unless the authorization is terminated or revoked.  Performed at [facility], [place] [address] [place], [place] [address]   Resp Panel by RT-PCR (Flu A&B, Covid) Nasopharyngeal Swab     Status: None   Collection Time: 09/20/20  7:22 AM   Specimen: Nasopharyngeal Swab; Nasopharyngeal(NP) swabs in vial transport medium   Result Value Ref Range Status   SARS Coronavirus 2 by RT PCR NEGATIVE NEGATIVE Final    Comment: (NOTE) SARS-CoV-2 target nucleic acids are NOT DETECTED.  The SARS-CoV-2 RNA is generally detectable in upper respiratory specimens during the acute phase of infection. The lowest concentration of SARS-CoV-2 viral copies this assay can detect is 138 copies/mL. A negative result does not preclude SARS-Cov-2 infection and should not be used as the sole basis for treatment or other patient management decisions. A negative result may occur with  improper specimen collection/handling, submission of specimen other than nasopharyngeal swab, presence of viral mutation(s) within the areas targeted by this assay, and inadequate number of viral copies(<138 copies/mL). A negative result must be combined with clinical observations, patient history, and epidemiological information. The expected result is Negative.  Fact Sheet for Patients:  EntrepreneurPulse.com.au  Fact Sheet for Healthcare Providers:  IncredibleEmployment.be  This test is no t yet approved or cleared by the [place] FDA and  has been authorized for detection and/or diagnosis of SARS-CoV-2 by FDA under an Emergency Use Authorization (EUA). This EUA will remain  in effect (meaning this test can be used) for the duration of the COVID-19 declaration under Section 564(b)(1) of the Act, 21 U.S.C.section 360bbb-3(b)(1), unless the authorization is terminated  or revoked sooner.       Influenza A by PCR NEGATIVE NEGATIVE Final   Influenza B by PCR NEGATIVE NEGATIVE Final    Comment: (NOTE) The Xpert Xpress SARS-CoV-2/FLU/RSV plus assay is intended as an aid in the diagnosis of influenza from Nasopharyngeal swab specimens and should not be used as a sole basis for treatment. Nasal washings and aspirates are unacceptable for Xpert Xpress SARS-CoV-2/FLU/RSV testing.  Fact Sheet for  Patients: EntrepreneurPulse.com.au  Fact Sheet for Healthcare Providers: IncredibleEmployment.be  This test is not yet approved or cleared by the [place] FDA and has been authorized for detection and/or diagnosis of SARS-CoV-2 by FDA under an Emergency Use Authorization (EUA). This EUA will remain in effect (meaning this test can be used) for the duration of the COVID-19 declaration under Section 564(b)(1) of the Act, 21 U.S.C. section 360bbb-3(b)(1), unless the authorization is terminated or revoked.  Performed at [facility], [place] [address] [place], [place]  [id number]      Labs:  CBC: Recent Labs  Lab 09/20/20 0643 09/21/20 0444  WBC 10.9* 8.6  HGB 10.9* 10.3*  HCT 36.6 33.0*  MCV 95.8 93.8  PLT 242 208   BMP &GFR Recent Labs  Lab 09/20/20 0643 09/21/20 0444  NA 138 138  K 5.2* 5.5*  CL 96* 95*  CO2 25 30  GLUCOSE 115* 105*  BUN 57* 29*  CREATININE 13.25* 8.77*  CALCIUM 8.7* 8.7*  MG 2.0  --    CrCl cannot be calculated (Unknown ideal weight.). Liver & Pancreas: Recent Labs  Lab 09/20/20 0643  AST 20  ALT 16  ALKPHOS 61  BILITOT 0.5  PROT 8.4*  ALBUMIN 3.6   No results for input(s): LIPASE, AMYLASE in the last 168 hours. No results for input(s): AMMONIA in the last 168 hours. Diabetic: No results for input(s): HGBA1C in the last 72 hours. No results for input(s): GLUCAP in the last 168 hours. Cardiac Enzymes: No results for input(s): CKTOTAL, CKMB, CKMBINDEX, TROPONINI in the last 168 hours. No results for input(s): PROBNP in the last 8760 hours. Coagulation Profile: No results for input(s): INR, PROTIME in the last 168 hours. Thyroid Function Tests: No results for input(s): TSH, T4TOTAL, FREET4, T3FREE, THYROIDAB in the last 72 hours. Lipid Profile: No results for input(s): CHOL, HDL, LDLCALC, TRIG, CHOLHDL, LDLDIRECT in the last 72 hours. Anemia Panel: No results for input(s): VITAMINB12,  FOLATE, FERRITIN, TIBC, IRON, RETICCTPCT in the last 72 hours. Urine analysis:    Component Value Date/Time   COLORURINE YELLOW 04/20/2017 0210   APPEARANCEUR CLEAR 04/20/2017 0210   LABSPEC 1.014 04/20/2017 0210   PHURINE 5.0 04/20/2017 0210   GLUCOSEU 50 (A) 04/20/2017 0210   HGBUR NEGATIVE 04/20/2017 0210   BILIRUBINUR NEGATIVE 04/20/2017 0210   KETONESUR NEGATIVE 04/20/2017 0210   PROTEINUR 100 (A) 04/20/2017 0210   NITRITE NEGATIVE 04/20/2017 0210   LEUKOCYTESUR NEGATIVE 04/20/2017 0210   Sepsis Labs: Invalid input(s): PROCALCITONIN, LACTICIDVEN   Time coordinating discharge: 40 minutes  SIGNED:  Mercy Riding, MD  [facility] 09/21/2020, 11:13 AM  If 7PM-7AM, please contact night-coverage [email or web address]

## 2020-09-21 NOTE — Progress Notes (Addendum)
Called by HD APP that pt's HRs were mid-high 40's and asymptomatic When I came to see her she was asleep but had been awake earlier HRs in review of telemetry started to trend towards the 50's after her AM medicines In review with HD APP at another outpt session she has had HR 48 documented.  The patient woke easily and is completely asymptomatic SBPs  in 120's  I will d/w Dr. Rayann Heman, though suspect still OK to discharge.  Tommye Standard, PA-C  I spoke with Dr. Rayann Heman.  Agrees, still OK to discharge with AFib clinic visit as scheduled.  Tommye Standard, PA-C

## 2020-09-22 ENCOUNTER — Telehealth: Payer: Self-pay | Admitting: Nurse Practitioner

## 2020-09-22 DIAGNOSIS — N186 End stage renal disease: Secondary | ICD-10-CM | POA: Diagnosis not present

## 2020-09-22 DIAGNOSIS — Z992 Dependence on renal dialysis: Secondary | ICD-10-CM | POA: Diagnosis not present

## 2020-09-22 DIAGNOSIS — I770 Arteriovenous fistula, acquired: Secondary | ICD-10-CM | POA: Diagnosis not present

## 2020-09-22 DIAGNOSIS — N2581 Secondary hyperparathyroidism of renal origin: Secondary | ICD-10-CM | POA: Diagnosis not present

## 2020-09-22 DIAGNOSIS — D689 Coagulation defect, unspecified: Secondary | ICD-10-CM | POA: Diagnosis not present

## 2020-09-22 NOTE — Telephone Encounter (Signed)
Transition of care contact from inpatient facility  Date of Discharge: 09/21/2020 Date of Contact: 09/22/2020 Method of contact: Phone  Attempted to contact patient to discuss transition of care from inpatient admission. Unable to leave message as patient has voicemail that has not been set up yet.

## 2020-09-23 ENCOUNTER — Telehealth (HOSPITAL_COMMUNITY): Payer: Self-pay | Admitting: Nephrology

## 2020-09-23 NOTE — Telephone Encounter (Signed)
Transition of care contact from inpatient facility  Date of discharge: 09/21/2020 Date of contact: 09/23/2020 Method: Phone Spoke to: Patient  Patient contacted to discuss transition of care from recent inpatient hospitalization. Patient was admitted to [facility] from 6/14-6/15/2022 with discharge diagnosis of pulmonary edema and A-fib RVR.  She did go to OP HD yesterday - EDW was lowered to 106kg during admit - when she got there, her pre-HD weight was 105kg. She had no UF with HD, but still managed to have palpitations and cramping with HD so is very insistent that she does not want EDW lowered any further. Palpitations resolved with rest after her dialysis treatment.  Medication changes were reviewed.  Patient will follow up with his/her outpatient HD unit on: Tomorrow.  She has A-fib clinic appt next week.  Veneta Penton, PA-C [employer] Pager [phone]

## 2020-09-26 ENCOUNTER — Ambulatory Visit (HOSPITAL_COMMUNITY): Payer: Medicare Other | Admitting: Physician Assistant

## 2020-09-26 ENCOUNTER — Other Ambulatory Visit (HOSPITAL_COMMUNITY): Payer: Self-pay | Admitting: *Deleted

## 2020-09-26 MED ORDER — AMIODARONE HCL 200 MG PO TABS: 400.0000 mg | ORAL_TABLET | Freq: Every day | ORAL | 0 refills | Status: DC

## 2020-09-27 DIAGNOSIS — N186 End stage renal disease: Secondary | ICD-10-CM | POA: Diagnosis not present

## 2020-09-27 DIAGNOSIS — D689 Coagulation defect, unspecified: Secondary | ICD-10-CM | POA: Diagnosis not present

## 2020-09-27 DIAGNOSIS — I770 Arteriovenous fistula, acquired: Secondary | ICD-10-CM | POA: Diagnosis not present

## 2020-09-27 DIAGNOSIS — N2581 Secondary hyperparathyroidism of renal origin: Secondary | ICD-10-CM | POA: Diagnosis not present

## 2020-09-27 DIAGNOSIS — Z992 Dependence on renal dialysis: Secondary | ICD-10-CM | POA: Diagnosis not present

## 2020-09-29 ENCOUNTER — Other Ambulatory Visit: Payer: Self-pay

## 2020-09-29 ENCOUNTER — Encounter (HOSPITAL_COMMUNITY): Payer: Self-pay | Admitting: Emergency Medicine

## 2020-09-29 ENCOUNTER — Emergency Department (HOSPITAL_COMMUNITY)
Admission: EM | Admit: 2020-09-29 | Discharge: 2020-09-29 | Disposition: A | Discharge status: Home / Self Care | Payer: Medicare Other | Attending: Emergency Medicine | Admitting: Emergency Medicine

## 2020-09-29 DIAGNOSIS — Z7901 Long term (current) use of anticoagulants: Secondary | ICD-10-CM | POA: Diagnosis not present

## 2020-09-29 DIAGNOSIS — Z7982 Long term (current) use of aspirin: Secondary | ICD-10-CM | POA: Diagnosis not present

## 2020-09-29 DIAGNOSIS — Z8616 Personal history of COVID-19: Secondary | ICD-10-CM | POA: Insufficient documentation

## 2020-09-29 DIAGNOSIS — I132 Hypertensive heart and chronic kidney disease with heart failure and with stage 5 chronic kidney disease, or end stage renal disease: Secondary | ICD-10-CM | POA: Diagnosis not present

## 2020-09-29 DIAGNOSIS — I4891 Unspecified atrial fibrillation: Secondary | ICD-10-CM | POA: Insufficient documentation

## 2020-09-29 DIAGNOSIS — I770 Arteriovenous fistula, acquired: Secondary | ICD-10-CM | POA: Diagnosis not present

## 2020-09-29 DIAGNOSIS — I499 Cardiac arrhythmia, unspecified: Secondary | ICD-10-CM | POA: Diagnosis not present

## 2020-09-29 DIAGNOSIS — N2581 Secondary hyperparathyroidism of renal origin: Secondary | ICD-10-CM | POA: Diagnosis not present

## 2020-09-29 DIAGNOSIS — I4892 Unspecified atrial flutter: Secondary | ICD-10-CM | POA: Diagnosis not present

## 2020-09-29 DIAGNOSIS — I5032 Chronic diastolic (congestive) heart failure: Secondary | ICD-10-CM | POA: Insufficient documentation

## 2020-09-29 DIAGNOSIS — Z79899 Other long term (current) drug therapy: Secondary | ICD-10-CM | POA: Diagnosis not present

## 2020-09-29 DIAGNOSIS — R000 Tachycardia, unspecified: Secondary | ICD-10-CM | POA: Diagnosis not present

## 2020-09-29 DIAGNOSIS — Z992 Dependence on renal dialysis: Secondary | ICD-10-CM | POA: Insufficient documentation

## 2020-09-29 DIAGNOSIS — R002 Palpitations: Secondary | ICD-10-CM | POA: Diagnosis present

## 2020-09-29 DIAGNOSIS — Z743 Need for continuous supervision: Secondary | ICD-10-CM | POA: Diagnosis not present

## 2020-09-29 DIAGNOSIS — Z7902 Long term (current) use of antithrombotics/antiplatelets: Secondary | ICD-10-CM | POA: Insufficient documentation

## 2020-09-29 DIAGNOSIS — N186 End stage renal disease: Secondary | ICD-10-CM | POA: Diagnosis not present

## 2020-09-29 DIAGNOSIS — D689 Coagulation defect, unspecified: Secondary | ICD-10-CM | POA: Diagnosis not present

## 2020-09-29 LAB — COMPREHENSIVE METABOLIC PANEL
ALT: 15 U/L (ref 0–44)
AST: 21 U/L (ref 15–41)
Albumin: 3.4 g/dL — ABNORMAL LOW (ref 3.5–5.0)
Alkaline Phosphatase: 57 U/L (ref 38–126)
Anion gap: 17 — ABNORMAL HIGH (ref 5–15)
BUN: 32 mg/dL — ABNORMAL HIGH (ref 6–20)
CO2: 26 mmol/L (ref 22–32)
Calcium: 8.7 mg/dL — ABNORMAL LOW (ref 8.9–10.3)
Chloride: 94 mmol/L — ABNORMAL LOW (ref 98–111)
Creatinine, Ser: 10.02 mg/dL — ABNORMAL HIGH (ref 0.44–1.00)
GFR, Estimated: 4 mL/min — ABNORMAL LOW (ref >60)
Glucose, Bld: 97 mg/dL (ref 70–99)
Potassium: 4.2 mmol/L (ref 3.5–5.1)
Sodium: 137 mmol/L (ref 135–145)
Total Bilirubin: 0.7 mg/dL (ref 0.3–1.2)
Total Protein: 7.8 g/dL (ref 6.5–8.1)

## 2020-09-29 LAB — CBC WITH DIFFERENTIAL/PLATELET
Abs Immature Granulocytes: 0.05 10*3/uL (ref 0.00–0.07)
Basophils Absolute: 0.1 10*3/uL (ref 0.0–0.1)
Basophils Relative: 1 %
Eosinophils Absolute: 0.1 10*3/uL (ref 0.0–0.5)
Eosinophils Relative: 2 %
HCT: 33.6 % — ABNORMAL LOW (ref 36.0–46.0)
Hemoglobin: 10.2 g/dL — ABNORMAL LOW (ref 12.0–15.0)
Immature Granulocytes: 1 %
Lymphocytes Relative: 16 %
Lymphs Abs: 1.1 10*3/uL (ref 0.7–4.0)
MCH: 28.6 pg (ref 26.0–34.0)
MCHC: 30.4 g/dL (ref 30.0–36.0)
MCV: 94.1 fL (ref 80.0–100.0)
Monocytes Absolute: 1 10*3/uL (ref 0.1–1.0)
Monocytes Relative: 14 %
Neutro Abs: 4.8 10*3/uL (ref 1.7–7.7)
Neutrophils Relative %: 66 %
Platelets: 181 10*3/uL (ref 150–400)
RBC: 3.57 MIL/uL — ABNORMAL LOW (ref 3.87–5.11)
RDW: 17 % — ABNORMAL HIGH (ref 11.5–15.5)
WBC: 7.1 10*3/uL (ref 4.0–10.5)
nRBC: 0 % (ref 0.0–0.2)

## 2020-09-29 MED ORDER — AMIODARONE IV BOLUS ONLY 150 MG/100ML
150.0000 mg | Freq: Once | INTRAVENOUS | Status: DC
  Filled 2020-09-29: qty 100

## 2020-09-29 NOTE — ED Notes (Signed)
IV TAKEN OUT AT DIS CHARGE

## 2020-09-29 NOTE — ED Notes (Signed)
Pt converted back to sinus rhythm before any medication was given. Performed EKG and showed EDP.

## 2020-09-29 NOTE — ED Notes (Signed)
Discharge paperwork given to pt. Pt agreeable to discharge and understands instructions. Esignature pad not working.

## 2020-09-29 NOTE — ED Triage Notes (Addendum)
Pt arrives via EMS from dialysis. Pt did not receive any treatment. Pt was found to be in Afib RVR 120-170. Pt felt fluttering in her chest but no pain. Denies dizziness. Pt has hx of Afib 137/89, 99% on 2LNC. CBG 100

## 2020-09-29 NOTE — ED Provider Notes (Signed)
[facility] EMERGENCY DEPARTMENT Provider Note   CSN: [id number] Arrival date & time: 09/29/20  0734     History Chief Complaint  Patient presents with   Atrial Fibrillation    Erin Dean is a 55 y.o. female.  HPI     55 year old female with a history of ESRD, hypertension, CVA, chronic diastolic heart failure, SVT, paroxysmal atrial fibrillation on amiodarone and Eliquis presents with concern for palpitations found to have atrial flutter with RVR.  Reports that she got to dialysis this morning for her regular scheduled Tuesday Thursday Saturday session, and when she sat down she felt fluttering in her chest.  She notified the staff who found her to be tachycardic and sent her to the emergency department.  She reports she was hooked up but did not begin dialysis.  She is otherwise not missed any sessions.  Denies any acute illness.  Reports she did have some caffeine yesterday, but denies any new medications, caffeine today, cough, urinary symptoms, abdominal pain, nausea, vomiting, diarrhea.  Reports in the past when she has had atrial flutter she has had some sort of pain chest pain or dyspnea, but this time she only felt fluttering.  Denies any shortness of breath or chest pain today.  At this time, she remains in atrial flutter with an elevated rate, but reports she is not having any symptoms.  Admits that she may have forgotten a dose of Eliquis in the last 3 weeks. She has otherwise been taking her medications regularly. She watched her grandkids yesterday ages 72 and 49. No known sick contacts.  Past Medical History:  Diagnosis Date   ESRD (end stage renal disease) ([place])    [facility] [place]    Hypertension    Stroke ([place]) 04/2017   no residual . Limp left side   SVT (supraventricular tachycardia) ([place])     Patient Active Problem List   Diagnosis Date Noted   Volume overload 09/20/2020   Chronic diastolic CHF (congestive heart failure) (HCC)    Non-ST  elevation (NSTEMI) myocardial infarction (HCC)    Abnormal stress test    Arrhythmia, atrial 06/18/2020   Elevated troponin 06/18/2020   Chest pain 06/18/2020   PAF (paroxysmal atrial fibrillation) ([place])    Atypical chest pain 06/17/2020   Acute CVA (cerebrovascular accident) ([place]) 03/31/2020   COVID-19 virus infection 03/31/2020   Chronic diastolic (congestive) heart failure ([place]) 03/31/2020   ESRD (end stage renal disease) ([place]) 05/06/2018   Hyperlipidemia 03/11/2018   Family hx-stroke 03/11/2018   CKD (chronic kidney disease) stage 5, GFR less than 15 ml/min (HCC) 07/31/2017   Cryptogenic stroke ([place])    Hypertensive emergency    Demand ischemia ([place])    Stroke-like episode ([place]) s/p tPA 04/17/2017   Essential hypertension 12/01/2015   Obesity 12/01/2015   Lower extremity edema 12/01/2015   Heart murmur 12/01/2015   Hypertensive urgency 09/22/2012   Anxiety 09/22/2012    Past Surgical History:  Procedure Laterality Date   AV FISTULA PLACEMENT Right 08/16/2017   Procedure: CREATION OF RADIOCEPHALIC VERSUS BRACHIOCEPHALIC ARTERIOVENOUS FISTULA RIGHT ARM;  Surgeon: Conrad Osage, MD;  Location: [facility] OR;  Service: Vascular;  Laterality: Right;   AV FISTULA PLACEMENT Right 02/07/2018   Procedure: Creation Right arm Brachiocephalic Fistula;  Surgeon: Marty Heck, MD;  Location: [facility] OR;  Service: Vascular;  Laterality: Right;   AV FISTULA PLACEMENT Left 08/15/2018   Procedure: ARTERIOVENOUS (AV) FISTULA CREATION;  Surgeon: Serafina Mitchell, MD;  Location:  [facility] OR;  Service: Vascular;  Laterality: Left;   BASCILIC VEIN TRANSPOSITION Right 04/07/2018   Procedure: SECOND STAGE BASILIC VEIN TRANSPOSITION RIGHT ARM;  Surgeon: Marty Heck, MD;  Location: [place];  Service: Vascular;  Laterality: Right;   CESAREAN SECTION     CORONARY ATHERECTOMY N/A 06/22/2020   Procedure: CORONARY ATHERECTOMY;  Surgeon: Wellington Hampshire, MD;  Location: [place] [facility] LAB;  Service: Cardiovascular;   Laterality: N/A;   CORONARY STENT INTERVENTION N/A 06/22/2020   Procedure: CORONARY STENT INTERVENTION;  Surgeon: Wellington Hampshire, MD;  Location: [place] [facility];  Service: Cardiovascular;  Laterality: N/A;   ESOPHAGOGASTRODUODENOSCOPY N/A 04/19/2017   Procedure: ESOPHAGOGASTRODUODENOSCOPY (EGD);  Surgeon: Carol Ada, MD;  Location: [place];  Service: Endoscopy;  Laterality: N/A;   FISTULA SUPERFICIALIZATION Right 10/01/2017   Procedure: FISTULA SUPERFICIALIZATION RIGHT ARM;  Surgeon: Conrad P[place]astburg, MD;  Location: [place];  Service: Vascular;  Laterality: Right;   FISTULA SUPERFICIALIZATION Left 10/08/2018   Procedure: FISTULA SUPERFICIALIZATION LEFT ARM;  Surgeon: Serafina Mitchell, MD;  Location: [place];  Service: Vascular;  Laterality: Left;   INTRAVASCULAR IMAGING/OCT N/A 06/22/2020   Procedure: INTRAVASCULAR IMAGING/OCT;  Surgeon: Wellington Hampshire, MD;  Location: [place] [facility];  Service: Cardiovascular;  Laterality: N/A;   IR FLUORO GUIDE CV LINE RIGHT  04/23/2018   IR US GUIDE VASC ACCESS RIGHT  04/23/2018   LEFT HEART CATH AND CORONARY ANGIOGRAPHY N/A 06/20/2020   Procedure: LEFT HEART CATH AND CORONARY ANGIOGRAPHY;  Surgeon: Nelva Bush, MD;  Location: [place] [facility];  Service: Cardiovascular;  Laterality: N/A;   LOOP RECORDER INSERTION N/A 04/22/2017   Procedure: LOOP RECORDER INSERTION;  Surgeon: Constance Haw, MD;  Location: [place] [facility];  Service: Cardiovascular;  Laterality: N/A;   RADIOLOGY WITH ANESTHESIA N/A 04/01/2020   Procedure: MRI W/O CONSTRAST  WITH ANESTHESIA;  Surgeon: Radiologist, Medication, MD;  Location: [place];  Service: Radiology;  Laterality: N/A;   TEE WITHOUT CARDIOVERSION N/A 04/19/2017   Procedure: TRANSESOPHAGEAL ECHOCARDIOGRAM (TEE);  Surgeon: Carol Ada, MD;  Location: [place];  Service: Endoscopy;  Laterality: N/A;     OB History   No obstetric history on file.     Family History  Problem Relation Age of Onset    Heart attack Maternal Uncle    Heart attack Maternal Grandfather    Stroke Paternal Grandmother    Cancer Father    Hypertension Sister     Social History   Tobacco Use   Smoking status: Never   Smokeless tobacco: Never  Vaping Use   Vaping Use: Never used  Substance Use Topics   Alcohol use: No   Drug use: No    Home Medications Prior to Admission medications   Medication Sig Start Date End Date Taking? Authorizing Provider  acetaminophen (TYLENOL) 325 MG tablet Take 2 tablets (650 mg total) by mouth every 6 (six) hours as needed for mild pain (or Fever >/= 101). 04/03/20  Yes Elgergawy, Silver Huguenin, MD  ALPRAZolam Duanne Moron) 0.5 MG tablet Take 0.5 mg by mouth daily as needed for anxiety.  07/16/18  Yes [provider]  amiodarone (PACERONE) 200 MG tablet Take 2 tablets (400 mg total) by mouth daily. Patient taking differently: Take 200 mg by mouth daily. 09/26/20  Yes Baldwin Jamaica, PA-C  amLODipine (NORVASC) 10 MG tablet Take 1 tablet (10 mg total) by mouth daily. 04/05/20  Yes Elgergawy, Silver Huguenin, MD  apixaban (ELIQUIS) 5 MG TABS  tablet Take 1 tablet (5 mg total) by mouth 2 (two) times daily. 06/23/20  Yes Nita Sells, MD  aspirin 81 MG chewable tablet Chew 1 tablet (81 mg total) by mouth daily. 06/23/20  Yes Nita Sells, MD  atorvastatin (LIPITOR) 80 MG tablet Take 1 tablet (80 mg total) by mouth daily at 6 PM. 04/03/20  Yes Elgergawy, Silver Huguenin, MD  calcitRIOL (ROCALTROL) 0.25 MCG capsule Take 1 capsule (0.25 mcg total) by mouth daily. 03/11/18  Yes Donzetta Starch, NP  carvedilol (COREG) 25 MG tablet Take 1 tablet (25 mg total) by mouth 2 (two) times daily with a meal. 03/11/18  Yes Donzetta Starch, NP  clopidogrel (PLAVIX) 75 MG tablet Take 1 tablet (75 mg total) by mouth daily before breakfast. 06/24/20  Yes Nita Sells, MD  ferrous sulfate 325 (65 FE) MG tablet Take 1 tablet (325 mg total) by mouth 3 (three) times daily with meals. 03/11/18  Yes Donzetta Starch, NP  nitroGLYCERIN (NITROSTAT) 0.4 MG SL tablet Place 0.4 mg under the tongue every 5 (five) minutes x 3 doses as needed for chest pain. 05/18/20  Yes [provider]  omeprazole (PRILOSEC) 20 MG capsule Take 20 mg by mouth daily. 02/12/19  Yes [provider]  RENAGEL 800 MG tablet Take 1,600-3,200 mg by mouth See admin instructions. 3200  mg with meals 1600 mg with snacks 07/23/18  Yes [provider]  sertraline (ZOLOFT) 25 MG tablet Take 25 mg by mouth daily. 09/01/20  Yes [provider]  Cinacalcet HCl (SENSIPAR PO) Take 1 tablet by mouth See admin instructions. Given at dialysis. Tuesday, Thursday, and Saturday. 10/31/19 10/29/20  [provider]  cyanocobalamin 1000 MCG tablet Take 1 tablet (1,000 mcg total) by mouth daily. Patient not taking: Reported on 09/29/2020 03/11/18   Donzetta Starch, NP    Allergies    Chlorhexidine gluconate  Review of Systems   Review of Systems  Constitutional:  Negative for fever.  HENT:  Negative for sore throat.   Eyes:  Negative for visual disturbance.  Respiratory:  Negative for cough and shortness of breath.   Cardiovascular:  Positive for palpitations. Negative for chest pain.  Gastrointestinal:  Negative for abdominal pain, nausea and vomiting.  Genitourinary:  Negative for difficulty urinating and dysuria.  Musculoskeletal:  Negative for back pain.  Skin:  Negative for rash.  Neurological:  Negative for syncope and headaches.   Physical Exam Updated Vital Signs BP (!) 161/84 (BP Location: Right Arm)   Pulse (!) 59   Temp 98.5 F (36.9 C) (Oral)   Resp 16   Ht '5\' 8"'$  (1.727 m)   Wt 96.2 kg   LMP 08/12/2017   SpO2 92%   BMI 32.23 kg/m   Physical Exam Vitals and nursing note reviewed.  Constitutional:      General: She is not in acute distress.    Appearance: She is well-developed. She is not diaphoretic.  HENT:     Head: Normocephalic and atraumatic.  Eyes:     Conjunctiva/sclera:  Conjunctivae normal.  Cardiovascular:     Rate and Rhythm: Regular rhythm. Tachycardia present.     Heart sounds: Normal heart sounds. No murmur heard.   No friction rub. No gallop.  Pulmonary:     Effort: Pulmonary effort is normal. No respiratory distress.     Breath sounds: Normal breath sounds. No wheezing or rales.  Musculoskeletal:        General: No tenderness.  Cervical back: Normal range of motion.  Skin:    General: Skin is warm and dry.     Findings: No erythema or rash.  Neurological:     Mental Status: She is alert and oriented to person, place, and time.    ED Results / Procedures / Treatments   Labs (all labs ordered are listed, but only abnormal results are displayed) Labs Reviewed  CBC WITH DIFFERENTIAL/PLATELET - Abnormal; Notable for the following components:      Result Value   RBC 3.57 (*)    Hemoglobin 10.2 (*)    HCT 33.6 (*)    RDW 17.0 (*)    All other components within normal limits  COMPREHENSIVE METABOLIC PANEL - Abnormal; Notable for the following components:   Chloride 94 (*)    BUN 32 (*)    Creatinine, Ser 10.02 (*)    Calcium 8.7 (*)    Albumin 3.4 (*)    GFR, Estimated 4 (*)    Anion gap 17 (*)    All other components within normal limits    EKG EKG Interpretation  Date/Time:  Thursday September 29 2020 08:43:56 EDT Ventricular Rate:  59 PR Interval:  211 QRS Duration: 113 QT Interval:  464 QTC Calculation: 460 R Axis:   92 Text Interpretation: Sinus rhythm Prolonged PR interval Probable left ventricular hypertrophy Nonspecific T abnormalities, lateral leads Since prior ECG, she is now in sinus rhythm Confirmed by Gareth Morgan [phone]) on 09/29/2020 8:47:38 AM  Radiology No results found.  Procedures Procedures   Medications Ordered in ED Medications - No data to display  ED Course  I have reviewed the triage vital signs and the nursing notes.  Pertinent labs & imaging results that were available during my care of the  patient were reviewed by me and considered in my medical decision making (see chart for details).    MDM Rules/Calculators/A&P                           55 year old female with a history of ESRD, hypertension, CVA, chronic diastolic heart failure, SVT, paroxysmal atrial fibrillation on amiodarone and Eliquis presents with palpitations found to have atrial flutter with RVR.  She remains in atrial flutter at this time but denies any symptoms, making it unclear if she has had asymptomatic atrial flutter and she admits to missing eliquis in the last 3 weeks and is not a candidate for ED cardioversion.   Prior to receiving medications, she cardioverted to normal sinus rhythm.  Labs obtained show no clinically significant abnormalities.  Discussed with Dr. Audie Box, agree if back in sinus rhythm recommend follow up as scheduled in 4 days, avoidance of triggers, no medication changes at this time.  Recommend sceduling discharge for today or tomorrow. Patient discharged in stable condition with understanding of reasons to return.     Final Clinical Impression(s) / ED Diagnoses Final diagnoses:  Atrial flutter with rapid ventricular response [facility]    Rx / DC Orders ED Discharge Orders     None        Gareth Morgan, MD 09/29/20 2127

## 2020-09-29 NOTE — Progress Notes (Deleted)
Cardiology Clinic Note   Patient Name: Erin Dean Date of Encounter: 09/29/2020  Primary Care Provider:  Lois Huxley, PA Primary Cardiologist:  Skeet Latch, MD  Patient Profile    Erin Dean presents to the clinic today for follow-up evaluation of her chronic diastolic CHF, HTN, and paroxysmal atrial fibrillation.  Past Medical History    Past Medical History:  Diagnosis Date   ESRD (end stage renal disease) ([place])    [facility] [place]    Hypertension    Stroke ([place]) 04/2017   no residual . Limp left side   SVT (supraventricular tachycardia) ([place])    Past Surgical History:  Procedure Laterality Date   AV FISTULA PLACEMENT Right 08/16/2017   Procedure: CREATION OF RADIOCEPHALIC VERSUS BRACHIOCEPHALIC ARTERIOVENOUS FISTULA RIGHT ARM;  Surgeon: Conrad Bellamy, MD;  Location: [facility] OR;  Service: Vascular;  Laterality: Right;   AV FISTULA PLACEMENT Right 02/07/2018   Procedure: Creation Right arm Brachiocephalic Fistula;  Surgeon: Marty Heck, MD;  Location: [place];  Service: Vascular;  Laterality: Right;   AV FISTULA PLACEMENT Left 08/15/2018   Procedure: ARTERIOVENOUS (AV) FISTULA CREATION;  Surgeon: Serafina Mitchell, MD;  Location: [place];  Service: Vascular;  Laterality: Left;   [place] Right 04/07/2018   Procedure: SECOND STAGE BASILIC VEIN TRANSPOSITION RIGHT ARM;  Surgeon: Marty Heck, MD;  Location: [place];  Service: Vascular;  Laterality: Right;   CESAREAN SECTION     CORONARY ATHERECTOMY N/A 06/22/2020   Procedure: CORONARY ATHERECTOMY;  Surgeon: Wellington Hampshire, MD;  Location: [place] [facility];  Service: Cardiovascular;  Laterality: N/A;   CORONARY STENT INTERVENTION N/A 06/22/2020   Procedure: CORONARY STENT INTERVENTION;  Surgeon: Wellington Hampshire, MD;  Location: [place] [facility];  Service: Cardiovascular;  Laterality: N/A;   ESOPHAGOGASTRODUODENOSCOPY N/A 04/19/2017   Procedure: ESOPHAGOGASTRODUODENOSCOPY (EGD);  Surgeon:  Carol Ada, MD;  Location: [place];  Service: Endoscopy;  Laterality: N/A;   FISTULA SUPERFICIALIZATION Right 10/01/2017   Procedure: FISTULA SUPERFICIALIZATION RIGHT ARM;  Surgeon: Conrad Forest Home, MD;  Location: [place];  Service: Vascular;  Laterality: Right;   FISTULA SUPERFICIALIZATION Left 10/08/2018   Procedure: FISTULA SUPERFICIALIZATION LEFT ARM;  Surgeon: Serafina Mitchell, MD;  Location: [place];  Service: Vascular;  Laterality: Left;   INTRAVASCULAR IMAGING/OCT N/A 06/22/2020   Procedure: INTRAVASCULAR IMAGING/OCT;  Surgeon: Wellington Hampshire, MD;  Location: [place] [facility];  Service: Cardiovascular;  Laterality: N/A;   IR FLUORO GUIDE CV LINE RIGHT  04/23/2018   IR US GUIDE VASC ACCESS RIGHT  04/23/2018   LEFT HEART CATH AND CORONARY ANGIOGRAPHY N/A 06/20/2020   Procedure: LEFT HEART CATH AND CORONARY ANGIOGRAPHY;  Surgeon: Nelva Bush, MD;  Location: [place] [facility];  Service: Cardiovascular;  Laterality: N/A;   LOOP RECORDER INSERTION N/A 04/22/2017   Procedure: LOOP RECORDER INSERTION;  Surgeon: Constance Haw, MD;  Location: [place] [facility];  Service: Cardiovascular;  Laterality: N/A;   RADIOLOGY WITH ANESTHESIA N/A 04/01/2020   Procedure: MRI W/O CONSTRAST  WITH ANESTHESIA;  Surgeon: Radiologist, Medication, MD;  Location: [place];  Service: Radiology;  Laterality: N/A;   TEE WITHOUT CARDIOVERSION N/A 04/19/2017   Procedure: TRANSESOPHAGEAL ECHOCARDIOGRAM (TEE);  Surgeon: Carol Ada, MD;  Location: [place];  Service: Endoscopy;  Laterality: N/A;    Allergies  Allergies  Allergen Reactions   Chlorhexidine Gluconate Rash    History of Present Illness    Erin Dean has a  PMH of end-stage renal disease, hypertension, CVA, chronic diastolic CHF, SVT, PAF on amiodarone and apixaban.  She presented to the emergency department 09/29/2020 with complaints of palpitations.  She was found to be in atrial flutter with RVR.  She reported that she reported to  dialysis and felt fluttering in her chest.  She was found to be tachycardic and was sent to the ED.  She was hooked up to the dialysis machine but did not begin her dialysis.  She reported that she had not missed any of her dialysis sessions.  She denied any recent illnesses.  She did state that she had some caffeine on the previous day but denied medications, caffeine use, cough urinary symptoms, and abdominal pain on the day of presentation to the ED.  She denied nausea vomiting diarrhea.  She noted that previously when she had atrial flutter she noted chest pain or dyspnea.  However, with this occurrence she only noted fluttering.  At the time of evaluation she did not have any symptoms.  She had not taken her apixaban for 3 weeks.  She reported that she had been taking her other medications as directed.  She had been watching her 9-year-old and 65-year-old grandchildren on the previous day but denied sick contacts.  Her EKG showed normal sinus rhythm with prolonged PR interval and probable LVH nonspecific T wave abnormality with a rate of 59 bpm.  She had converted to sinus rhythm prior to receiving medications.  Case was discussed with cardiology.  Triggers for atrial fibrillation were discussed.  No medication changes were made.  She was discharged in stable condition and asked to follow-up as an outpatient.  She presents the clinic today for follow-up evaluation states***  *** denies chest pain, shortness of breath, lower extremity edema, fatigue, palpitations, melena, hematuria, hemoptysis, diaphoresis, weakness, presyncope, syncope, orthopnea, and PND.   Home Medications    Prior to Admission medications   Medication Sig Start Date End Date Taking? Authorizing Provider  acetaminophen (TYLENOL) 325 MG tablet Take 2 tablets (650 mg total) by mouth every 6 (six) hours as needed for mild pain (or Fever >/= 101). 04/03/20   Elgergawy, Silver Huguenin, MD  ALPRAZolam Duanne Moron) 0.5 MG tablet Take 0.5 mg by  mouth daily as needed for anxiety.  07/16/18   [provider]  amiodarone (PACERONE) 200 MG tablet Take 2 tablets (400 mg total) by mouth daily. 09/26/20   Baldwin Jamaica, PA-C  amLODipine (NORVASC) 10 MG tablet Take 1 tablet (10 mg total) by mouth daily. 04/05/20   Elgergawy, Silver Huguenin, MD  apixaban (ELIQUIS) 5 MG TABS tablet Take 1 tablet (5 mg total) by mouth 2 (two) times daily. 06/23/20   Nita Sells, MD  aspirin 81 MG chewable tablet Chew 1 tablet (81 mg total) by mouth daily. 06/23/20   Nita Sells, MD  atorvastatin (LIPITOR) 80 MG tablet Take 1 tablet (80 mg total) by mouth daily at 6 PM. 04/03/20   Elgergawy, Silver Huguenin, MD  calcitRIOL (ROCALTROL) 0.25 MCG capsule Take 1 capsule (0.25 mcg total) by mouth daily. 03/11/18   Donzetta Starch, NP  carvedilol (COREG) 25 MG tablet Take 1 tablet (25 mg total) by mouth 2 (two) times daily with a meal. 03/11/18   Donzetta Starch, NP  Cinacalcet HCl (SENSIPAR PO) Take 1 tablet by mouth See admin instructions. Given at dialysis. Tuesday, Thursday, and Saturday. 10/31/19 10/29/20  [provider]  clopidogrel (PLAVIX) 75 MG tablet Take 1 tablet (75 mg  total) by mouth daily before breakfast. 06/24/20   Nita Sells, MD  cyanocobalamin 1000 MCG tablet Take 1 tablet (1,000 mcg total) by mouth daily. 03/11/18   Donzetta Starch, NP  ferrous sulfate 325 (65 FE) MG tablet Take 1 tablet (325 mg total) by mouth 3 (three) times daily with meals. 03/11/18   Donzetta Starch, NP  multivitamin (RENA-VIT) TABS tablet Take 1 tablet by mouth daily. 08/30/20   [provider]  nitroGLYCERIN (NITROSTAT) 0.4 MG SL tablet Place 0.4 mg under the tongue every 5 (five) minutes x 3 doses as needed for chest pain. 05/18/20   [provider]  omeprazole (PRILOSEC) 20 MG capsule Take 20 mg by mouth daily. 02/12/19   [provider]  RENAGEL 800 MG tablet Take 1,600-3,200 mg by mouth See admin instructions. 3200  mg with meals 1600  mg with snacks 07/23/18   [provider]  sertraline (ZOLOFT) 25 MG tablet Take 25 mg by mouth daily. 09/01/20   [provider]    Family History    Family History  Problem Relation Age of Onset   Heart attack Maternal Uncle    Heart attack Maternal Grandfather    Stroke Paternal Grandmother    Cancer Father    Hypertension Sister    She indicated that her mother is alive. She indicated that her father is deceased. She indicated that her sister is alive. She indicated that her brother is alive. She indicated that her maternal grandmother is deceased. She indicated that her maternal grandfather is deceased. She indicated that her paternal grandmother is deceased. She indicated that her paternal grandfather is deceased. She indicated that the status of her maternal uncle is unknown.  Social History    Social History   Socioeconomic History   Marital status: Legally Separated    Spouse name: Not on file   Number of children: Not on file   Years of education: Not on file   Highest education level: Not on file  Occupational History   Not on file  Tobacco Use   Smoking status: Never   Smokeless tobacco: Never  Vaping Use   Vaping Use: Never used  Substance and Sexual Activity   Alcohol use: No   Drug use: No   Sexual activity: Not on file  Other Topics Concern   Not on file  Social History Narrative   Lives with daughter   Education- high school   On temp disability   Social Determinants of Health   Financial Resource Strain: Not on file  Food Insecurity: Not on file  Transportation Needs: Not on file  Physical Activity: Not on file  Stress: Not on file  Social Connections: Not on file  Intimate Partner Violence: Not on file     Review of Systems    General:  No chills, fever, night sweats or weight changes.  Cardiovascular:  No chest pain, dyspnea on exertion, edema, orthopnea, palpitations, paroxysmal nocturnal dyspnea. Dermatological: No rash,  lesions/masses Respiratory: No cough, dyspnea Urologic: No hematuria, dysuria Abdominal:   No nausea, vomiting, diarrhea, bright red blood per rectum, melena, or hematemesis Neurologic:  No visual changes, wkns, changes in mental status. All other systems reviewed and are otherwise negative except as noted above.  Physical Exam    VS:  LMP 08/12/2017  , BMI There is no height or weight on file to calculate BMI. GEN: Well nourished, well developed, in no acute distress. HEENT: normal. Neck: Supple, no JVD, carotid bruits,  or masses. Cardiac: RRR, no murmurs, rubs, or gallops. No clubbing, cyanosis, edema.  Radials/DP/PT 2+ and equal bilaterally.  Respiratory:  Respirations regular and unlabored, clear to auscultation bilaterally. GI: Soft, nontender, nondistended, BS + x 4. MS: no deformity or atrophy. Skin: warm and dry, no rash. Neuro:  Strength and sensation are intact. Psych: Normal affect.  Accessory Clinical Findings    Recent Labs: 06/18/2020: TSH 1.148 09/20/2020: ALT 16; Magnesium 2.0 09/21/2020: BUN 29; Creatinine, Ser 8.77; Hemoglobin 10.3; Platelets 208; Potassium 5.5; Sodium 138   Recent Lipid Panel    Component Value Date/Time   CHOL 144 04/01/2020 0115   TRIG 72 04/01/2020 0115   HDL 33 (L) 04/01/2020 0115   CHOLHDL 4.4 04/01/2020 0115   VLDL 14 04/01/2020 0115   LDLCALC 97 04/01/2020 0115    ECG personally reviewed by me today- *** - No acute changes  Echocardiogram 06/19/2020 IMPRESSIONS     1. The aortic valve is tricuspid and the leaflets are moderately  calcified. There is trivial aortic regurgitation. There is no apparent  vegetation on the current study. It appears to just be calcium on the  valve. Additionally, there is no significant  regurgitation, which goes against a destructive lesion such as  endocarditis. If there are clinical concerns for endocarditis, would  recommend a TEE. The aortic valve is tricuspid. There is moderate  calcification  of the aortic valve. There is moderate  thickening of the aortic valve. Aortic valve regurgitation is trivial.  Mild to moderate aortic valve sclerosis/calcification is present, without  any evidence of aortic stenosis.   2. Left ventricular ejection fraction, by estimation, is 60 to 65%. The  left ventricle has normal function. The left ventricle has no regional  wall motion abnormalities.   3. A small pericardial effusion is present. The pericardial effusion is  circumferential.   4. The mitral valve is degenerative. Mild mitral valve regurgitation. No  evidence of mitral stenosis.   5. There is mildly elevated pulmonary artery systolic pressure.   Comparison(s): No prior Echocardiogram.   Cardiac catheterization 06/20/2020 Severe single-vessel coronary artery disease with 95% ostial/proximal LAD stenosis with heavy calcification.  Mild to moderate CAD also noted in mid/distal LAD, D1, ramus, intermedius, large OM1, codominant LCx, and RCA. Normal left ventricular filling pressure.   Recommendations: Images reviewed with interventional team and Dr. Radford Pax.  Given multiple comorbitities, including recurrent CVA and ESRD, patient is not a good candidate for CABG.  Additionally, she has refused procedures necessitating general anesthesia.  We agree that the most favorable plan would be to proceed with atherectomy/PCI of the ostial/proximal LAD. Stop ticagrelor after tonight's dose and load with clopidogrel 300 mg x 1 tomorrow morning, followed by 75 mg daily thereafter.  Anticipate discharge on apixaban and clopidogrel. Restart IV heparin in the setting of paroxysmal atrial fibrillation and NSTEMI 8 hours after right femoral artery sheath removed. Remove right femoral artery sheath with manual compression. Aggressive secondary prevention.   Nelva Bush, MD [facility]  Diagnostic Dominance: Co-dominant    Intervention   Cardiac catheterization 06/22/2020 Dist LM lesion is  15% stenosed. 1st Diag lesion is 70% stenosed. Mid LAD lesion is 50% stenosed. Ost LAD to Prox LAD lesion is 95% stenosed. Dist LAD lesion is 30% stenosed. 1st Mrg lesion is 30% stenosed. Dist Cx lesion is 60% stenosed with 60% stenosed side branch in LPAV. Ramus lesion is 60% stenosed. Post intervention, there is a 0% residual stenosis. A drug-eluting stent was successfully placed  using a STENT RESOLUTE ONYX 3.5X15.   Successful OCT guided orbital atherectomy and drug-eluting stent placement to the ostial LAD.  OCT images were overall suboptimal due to high flow state and difficulty clearing blood with contrast.   Recommendations: Dual antiplatelet therapy with aspirin and clopidogrel.  Aspirin can be discontinued after 1 week to minimize the risk of bleeding given that she is on anticoagulation. Anticoagulation can be resumed tomorrow if no bleeding issues from the right femoral artery.  Diagnostic Dominance: Co-dominant    Intervention      Assessment & Plan   1.  Paroxysmal atrial fibrillation-EKG today shows***.  No recent episodes of DOE or activity intolerance.  Denies palpitations or flutter type sensations.  Reports compliance with apixaban.  Denies bleeding issues.  History of GI bleed Continue amiodarone, aspirin, apixaban, carvedilol, Heart healthy low-sodium diet-salty 6 given Increase physical activity as tolerated  Essential hypertension-BP today***.  Well-controlled at home. Continue amlodipine, carvedilol Heart healthy low-sodium diet-salty 6 given Increase physical activity as tolerated  Coronary artery disease-denies recent episodes of chest discomfort.  Underwent staged cardiac catheterization 06/20/2020 and 06/22/2020 with arthrectomy and stent placement to her LAD.  Previously placed on triple therapy for 1 month (Plavix discontinued). Continue  apixaban, aspirin, carvedilol Heart healthy low-sodium diet-salty 6 given Increase physical activity as  tolerated  Recurrent CVA-neurologically intact. Continue aspirin and Brilinta  End-stage renal disease-reports compliance with dialysis Follows with nephrology  Disposition: Follow-up with Dr. Oval Linsey in 3 months.  Jossie Ng. Laporscha Linehan NP-C    09/29/2020, 7:05 AM [address] Office [phone] Fax [phone]  Notice: This dictation was prepared with Dragon dictation along with smaller phrase technology. Any transcriptional errors that result from this process are unintentional and may not be corrected upon review.  I spent***minutes examining this patient, reviewing medications, and using patient centered shared decision making involving her cardiac care.  Prior to her visit I spent greater than 20 minutes reviewing her past medical history,  medications, and prior cardiac tests.

## 2020-10-01 DIAGNOSIS — N2581 Secondary hyperparathyroidism of renal origin: Secondary | ICD-10-CM | POA: Diagnosis not present

## 2020-10-01 DIAGNOSIS — I770 Arteriovenous fistula, acquired: Secondary | ICD-10-CM | POA: Diagnosis not present

## 2020-10-01 DIAGNOSIS — D689 Coagulation defect, unspecified: Secondary | ICD-10-CM | POA: Diagnosis not present

## 2020-10-01 DIAGNOSIS — Z992 Dependence on renal dialysis: Secondary | ICD-10-CM | POA: Diagnosis not present

## 2020-10-01 DIAGNOSIS — N186 End stage renal disease: Secondary | ICD-10-CM | POA: Diagnosis not present

## 2020-10-03 ENCOUNTER — Encounter (HOSPITAL_COMMUNITY): Payer: Self-pay | Admitting: Physician Assistant

## 2020-10-03 ENCOUNTER — Other Ambulatory Visit: Payer: Self-pay

## 2020-10-03 ENCOUNTER — Ambulatory Visit (HOSPITAL_COMMUNITY)
Admission: RE | Admit: 2020-10-03 | Discharge: 2020-10-03 | Disposition: A | Discharge status: Home / Self Care | Payer: Medicare Other | Source: Ambulatory Visit | Attending: Physician Assistant | Admitting: Physician Assistant

## 2020-10-03 ENCOUNTER — Ambulatory Visit: Payer: Medicare Other | Admitting: General Practice

## 2020-10-03 VITALS — BP 106/70 | HR 144 | Ht 68.0 in | Wt 238.0 lb

## 2020-10-03 DIAGNOSIS — Z6836 Body mass index (BMI) 36.0-36.9, adult: Secondary | ICD-10-CM | POA: Insufficient documentation

## 2020-10-03 DIAGNOSIS — I2510 Atherosclerotic heart disease of native coronary artery without angina pectoris: Secondary | ICD-10-CM | POA: Insufficient documentation

## 2020-10-03 DIAGNOSIS — I1311 Hypertensive heart and chronic kidney disease without heart failure, with stage 5 chronic kidney disease, or end stage renal disease: Secondary | ICD-10-CM | POA: Insufficient documentation

## 2020-10-03 DIAGNOSIS — Z7901 Long term (current) use of anticoagulants: Secondary | ICD-10-CM | POA: Insufficient documentation

## 2020-10-03 DIAGNOSIS — D6869 Other thrombophilia: Secondary | ICD-10-CM | POA: Diagnosis not present

## 2020-10-03 DIAGNOSIS — Z992 Dependence on renal dialysis: Secondary | ICD-10-CM | POA: Insufficient documentation

## 2020-10-03 DIAGNOSIS — E669 Obesity, unspecified: Secondary | ICD-10-CM | POA: Insufficient documentation

## 2020-10-03 DIAGNOSIS — I480 Paroxysmal atrial fibrillation: Secondary | ICD-10-CM | POA: Insufficient documentation

## 2020-10-03 DIAGNOSIS — N186 End stage renal disease: Secondary | ICD-10-CM | POA: Diagnosis not present

## 2020-10-03 DIAGNOSIS — I4892 Unspecified atrial flutter: Secondary | ICD-10-CM | POA: Diagnosis not present

## 2020-10-03 NOTE — Progress Notes (Signed)
Primary Care Physician: Lois Huxley, PA Primary Cardiologist: Dr Oval Linsey Primary Electrophysiologist: Dr Rayann Heman Referring Physician: Dr Gerre Pebbles Erin Dean is a 55 y.o. female with a history of ESRF on HD, malignant HTN, obesity, chronic CHF (diastolic), stroke ([id number] and 2021), SVT, CAD, atrial fibrillation who presents for follow up in the [facility].  The patient was initially diagnosed with atrial fibrillation 06/2020 after presenting to the hospital with symptoms of chest pain. She converted on her own prior to medications. CP resolved with conversion to NSR. Given chest pain and EKG changes, she underwent myoview which was high risk. LHC showed 95% ostial-proximal LAD stenosis followed by 50% stenosis, 60% distal Cx, and mild disease in the RCA. She had orbital atherectomy and DES to ostial-proximal LAD on 06/22/20. Patient is on Eliquis for a CHADS2VASC score of 6. Patient seen in ED 09/06/20 with afib with RVR and converted to SR with IV diltiazem. Returned to ED 09/13/20 and underwent DCCV and started on amiodarone. Admitted 09/20/20 for afib but converted after lorazepam was given in the ED. Her amiodarone was increased for quicker loading.  On follow up today, patient reports that she feels more today with heart racing and fatigue. The symptoms started at 2:30 this afternoon. There were no triggers that she could identify. She started amiodarone 09/26/20. It was originally prescribed 6/7 but she was not able to get it at the pharmacy.   Today, she denies symptoms of chest pain, shortness of breath, orthopnea, PND, lower extremity edema, dizziness, presyncope, syncope, snoring, daytime somnolence, bleeding, or neurologic sequela. The patient is tolerating medications without difficulties and is otherwise without complaint today.    Atrial Fibrillation Risk Factors:  she does not have symptoms or diagnosis of sleep apnea. she does not have a history of  rheumatic fever. she does not have a history of alcohol use. The patient does have a history of early familial atrial fibrillation or other arrhythmias. Sister has afib  she has a BMI of Body mass index is 36.19 kg/m.Marland Kitchen Filed Weights   10/03/20 1535  Weight: 108 kg    Family History  Problem Relation Age of Onset   Heart attack Maternal Uncle    Heart attack Maternal Grandfather    Stroke Paternal Grandmother    Cancer Father    Hypertension Sister      Atrial Fibrillation Management history:  Previous antiarrhythmic drugs: amiodarone  Previous cardioversions: 09/13/20 Previous ablations: none CHADS2VASC score: 6 Anticoagulation history: Eliquis   Past Medical History:  Diagnosis Date   ESRD (end stage renal disease) ([place])    TTHS [place]    Hypertension    Stroke ([place]) 04/2017   no residual . Limp left side   SVT (supraventricular tachycardia) ([place])    Past Surgical History:  Procedure Laterality Date   AV FISTULA PLACEMENT Right 08/16/2017   Procedure: CREATION OF RADIOCEPHALIC VERSUS BRACHIOCEPHALIC ARTERIOVENOUS FISTULA RIGHT ARM;  Surgeon: [place], MD;  Location: [facility] OR;  Service: Vascular;  Laterality: Right;   AV FISTULA PLACEMENT Right 02/07/2018   Procedure: Creation Right arm Brachiocephalic Fistula;  Surgeon: Marty Heck, MD;  Location: [place];  Service: Vascular;  Laterality: Right;   AV FISTULA PLACEMENT Left 08/15/2018   Procedure: ARTERIOVENOUS (AV) FISTULA CREATION;  Surgeon: Serafina Mitchell, MD;  Location: [facility] OR;  Service: Vascular;  Laterality: Left;   [place] Right 04/07/2018   Procedure: SECOND STAGE BASILIC VEIN TRANSPOSITION RIGHT  ARM;  Surgeon: Marty Heck, MD;  Location: [place];  Service: Vascular;  Laterality: Right;   CESAREAN SECTION     CORONARY ATHERECTOMY N/A 06/22/2020   Procedure: CORONARY ATHERECTOMY;  Surgeon: [place] Hampshire, MD;  Location: [place] [facility];  Service: Cardiovascular;  Laterality:  N/A;   CORONARY STENT INTERVENTION N/A 06/22/2020   Procedure: CORONARY STENT INTERVENTION;  Surgeon: Wellington Hampshire, MD;  Location: [place] [facility];  Service: Cardiovascular;  Laterality: N/A;   ESOPHAGOGASTRODUODENOSCOPY N/A 04/19/2017   Procedure: ESOPHAGOGASTRODUODENOSCOPY (EGD);  Surgeon: Carol Ada, MD;  Location: [place];  Service: Endoscopy;  Laterality: N/A;   FISTULA SUPERFICIALIZATION Right 10/01/2017   Procedure: FISTULA SUPERFICIALIZATION RIGHT ARM;  Surgeon: [place], MD;  Location: [place];  Service: Vascular;  Laterality: Right;   FISTULA SUPERFICIALIZATION Left 10/08/2018   Procedure: FISTULA SUPERFICIALIZATION LEFT ARM;  Surgeon: Serafina Mitchell, MD;  Location: [place];  Service: Vascular;  Laterality: Left;   INTRAVASCULAR IMAGING/OCT N/A 06/22/2020   Procedure: INTRAVASCULAR IMAGING/OCT;  Surgeon: Wellington Hampshire, MD;  Location: [place] [facility];  Service: Cardiovascular;  Laterality: N/A;   IR FLUORO GUIDE CV LINE RIGHT  04/23/2018   IR US GUIDE VASC ACCESS RIGHT  04/23/2018   LEFT HEART CATH AND CORONARY ANGIOGRAPHY N/A 06/20/2020   Procedure: LEFT HEART CATH AND CORONARY ANGIOGRAPHY;  Surgeon: Nelva Bush, MD;  Location: [place] [facility];  Service: Cardiovascular;  Laterality: N/A;   LOOP RECORDER INSERTION N/A 04/22/2017   Procedure: LOOP RECORDER INSERTION;  Surgeon: Constance Haw, MD;  Location: [place] [facility];  Service: Cardiovascular;  Laterality: N/A;   RADIOLOGY WITH ANESTHESIA N/A 04/01/2020   Procedure: MRI W/O CONSTRAST  WITH ANESTHESIA;  Surgeon: Radiologist, Medication, MD;  Location: [place];  Service: Radiology;  Laterality: N/A;   TEE WITHOUT CARDIOVERSION N/A 04/19/2017   Procedure: TRANSESOPHAGEAL ECHOCARDIOGRAM (TEE);  Surgeon: Carol Ada, MD;  Location: [place];  Service: Endoscopy;  Laterality: N/A;    Current Outpatient Medications  Medication Sig Dispense Refill   acetaminophen (TYLENOL) 325 MG tablet Take 2  tablets (650 mg total) by mouth every 6 (six) hours as needed for mild pain (or Fever >/= 101).     ALPRAZolam (XANAX) 0.5 MG tablet Take 0.5 mg by mouth daily as needed for anxiety.      amiodarone (PACERONE) 200 MG tablet Take 2 tablets (400 mg total) by mouth daily. (Patient taking differently: Take 200 mg by mouth daily.) 60 tablet 0   amLODipine (NORVASC) 10 MG tablet Take 1 tablet (10 mg total) by mouth daily. 180 tablet 3   apixaban (ELIQUIS) 5 MG TABS tablet Take 1 tablet (5 mg total) by mouth 2 (two) times daily. 60 tablet 12   atorvastatin (LIPITOR) 80 MG tablet Take 1 tablet (80 mg total) by mouth daily at 6 PM. 30 tablet 0   calcitRIOL (ROCALTROL) 0.25 MCG capsule Take 1 capsule (0.25 mcg total) by mouth daily. 30 capsule 2   carvedilol (COREG) 25 MG tablet Take 1 tablet (25 mg total) by mouth 2 (two) times daily with a meal. 60 tablet 2   Cinacalcet HCl (SENSIPAR PO) Take 1 tablet by mouth See admin instructions. Given at dialysis. Tuesday, Thursday, and Saturday.     clopidogrel (PLAVIX) 75 MG tablet Take 1 tablet (75 mg total) by mouth daily before breakfast. 30 tablet 12   cyanocobalamin 1000 MCG tablet Take 1 tablet (1,000 mcg total) by mouth daily. [place]  tablet 2   ferrous sulfate 325 (65 FE) MG tablet Take 1 tablet (325 mg total) by mouth 3 (three) times daily with meals. 90 tablet 2   nitroGLYCERIN (NITROSTAT) 0.4 MG SL tablet Place 0.4 mg under the tongue every 5 (five) minutes x 3 doses as needed for chest pain.     omeprazole (PRILOSEC) 20 MG capsule Take 20 mg by mouth daily.     RENAGEL 800 MG tablet Take 1,600-3,200 mg by mouth See admin instructions. 3200  mg with meals 1600 mg with snacks     sertraline (ZOLOFT) 25 MG tablet Take 25 mg by mouth daily.     No current facility-administered medications for this encounter.    Allergies  Allergen Reactions   Chlorhexidine Gluconate Rash    Social History   Socioeconomic History   Marital status: Legally Separated     Spouse name: Not on file   Number of children: Not on file   Years of education: Not on file   Highest education level: Not on file  Occupational History   Not on file  Tobacco Use   Smoking status: Never   Smokeless tobacco: Never  Vaping Use   Vaping Use: Never used  Substance and Sexual Activity   Alcohol use: No   Drug use: No   Sexual activity: Not on file  Other Topics Concern   Not on file  Social History Narrative   Lives with daughter   Education- high school   On temp disability   Social Determinants of Health   Financial Resource Strain: Not on file  Food Insecurity: Not on file  Transportation Needs: Not on file  Physical Activity: Not on file  Stress: Not on file  Social Connections: Not on file  Intimate Partner Violence: Not on file     ROS- All systems are reviewed and negative except as per the HPI above.  Physical Exam: Vitals:   10/03/20 1535  BP: 106/70  Pulse: (!) 144  Weight: 108 kg  Height: '5\' 8"'$  (1.727 m)    GEN- The patient is a well appearing obese female, alert and oriented x 3 today.   Head- normocephalic, atraumatic Eyes-  Sclera clear, conjunctiva pink Ears- hearing intact Oropharynx- clear Neck- supple  Lungs- Clear to ausculation bilaterally, normal work of breathing Heart- irregular rate and rhythm, tachycardia, no murmurs, rubs or gallops  GI- soft, NT, ND, + BS Extremities- no clubbing, cyanosis, or edema MS- no significant deformity or atrophy Skin- no rash or lesion Psych- euthymic mood, full affect Neuro- strength and sensation are intact  Wt Readings from Last 3 Encounters:  10/03/20 108 kg  09/29/20 96.2 kg  09/21/20 106 kg    EKG today demonstrates  Atrial flutter with variable block Vent. rate 144 BPM PR interval * ms QRS duration 92 ms QT/QTcB 332/514 ms  Echo 06/19/20 demonstrated   1. The aortic valve is tricuspid and the leaflets are moderately  calcified. There is trivial aortic regurgitation.  There is no apparent  vegetation on the current study. It appears to just be calcium on the valve. Additionally, there is no significant regurgitation, which goes against a destructive lesion such as endocarditis. If there are clinical concerns for endocarditis, would recommend a TEE. The aortic valve is tricuspid. There is moderate calcification of the aortic valve. There is moderate thickening of the aortic valve. Aortic valve regurgitation is trivial. Mild to moderate aortic valve sclerosis/calcification is present, without any evidence of aortic stenosis.  2. Left ventricular ejection fraction, by estimation, is 60 to 65%. The left ventricle has normal function. The left ventricle has no regional wall motion abnormalities.   3. A small pericardial effusion is present. The pericardial effusion is circumferential.   4. The mitral valve is degenerative. Mild mitral valve regurgitation. No evidence of mitral stenosis.   5. There is mildly elevated pulmonary artery systolic pressure.   Epic records are reviewed at length today  CHA2DS2-VASc Score = 6  The patient's score is based upon: CHF History: Yes HTN History: Yes Diabetes History: No Stroke History: Yes Vascular Disease History: Yes Age Score: 0 Gender Score: 1      ASSESSMENT AND PLAN: 1. Paroxysmal Atrial Fibrillation/atrial flutter The patient's CHA2DS2-VASc score is 6, indicating a 9.7% annual risk of stroke.   Patient in rapid atrial flutter today. Symptoms have not been persistent. Offered ED but patient declined.  Increase amiodarone to 400 mg BID x 1 week. Hopefully she will be able to maintain SR as she loads on amiodarone.  Continue Eliquis 5 mg BID Continue carvedilol 25 mg BID D/w Dr Rayann Heman. Will refer to tertiary center for evaluation, Dr Willis Modena at [facility].   2. Secondary Hypercoagulable State (ICD10:  D68.69) The patient is at significant risk for stroke/thromboembolism based upon her CHA2DS2-VASc Score of 6.  Continue  Apixaban (Eliquis).   3. Obesity Body mass index is 36.19 kg/m. Lifestyle modification was discussed at length including regular exercise and weight reduction.  4. CAD No anginal symptoms. Stop ASA to avoid triple therapy.  5. HTN Stable, no changes today.  6. ESRD HD on Tu/Th/Sat   Follow up in the AF clinic later this week.   [facility] [address] [place], [place] [address] [phone] 10/03/2020 4:46 PM

## 2020-10-03 NOTE — Patient Instructions (Addendum)
Increase Amiodarone - Take '400mg'$  in the am and '400mg'$  in the pm  Stop Aspirin

## 2020-10-04 DIAGNOSIS — N186 End stage renal disease: Secondary | ICD-10-CM | POA: Diagnosis not present

## 2020-10-04 DIAGNOSIS — D689 Coagulation defect, unspecified: Secondary | ICD-10-CM | POA: Diagnosis not present

## 2020-10-04 DIAGNOSIS — N2581 Secondary hyperparathyroidism of renal origin: Secondary | ICD-10-CM | POA: Diagnosis not present

## 2020-10-04 DIAGNOSIS — Z992 Dependence on renal dialysis: Secondary | ICD-10-CM | POA: Diagnosis not present

## 2020-10-04 DIAGNOSIS — I770 Arteriovenous fistula, acquired: Secondary | ICD-10-CM | POA: Diagnosis not present

## 2020-10-05 ENCOUNTER — Other Ambulatory Visit (HOSPITAL_COMMUNITY): Payer: Self-pay | Admitting: *Deleted

## 2020-10-05 DIAGNOSIS — I4892 Unspecified atrial flutter: Secondary | ICD-10-CM

## 2020-10-06 DIAGNOSIS — I129 Hypertensive chronic kidney disease with stage 1 through stage 4 chronic kidney disease, or unspecified chronic kidney disease: Secondary | ICD-10-CM | POA: Diagnosis not present

## 2020-10-06 DIAGNOSIS — D689 Coagulation defect, unspecified: Secondary | ICD-10-CM | POA: Diagnosis not present

## 2020-10-06 DIAGNOSIS — N186 End stage renal disease: Secondary | ICD-10-CM | POA: Diagnosis not present

## 2020-10-06 DIAGNOSIS — N2581 Secondary hyperparathyroidism of renal origin: Secondary | ICD-10-CM | POA: Diagnosis not present

## 2020-10-06 DIAGNOSIS — I770 Arteriovenous fistula, acquired: Secondary | ICD-10-CM | POA: Diagnosis not present

## 2020-10-06 DIAGNOSIS — Z992 Dependence on renal dialysis: Secondary | ICD-10-CM | POA: Diagnosis not present

## 2020-10-07 ENCOUNTER — Other Ambulatory Visit: Payer: Self-pay

## 2020-10-07 ENCOUNTER — Ambulatory Visit (HOSPITAL_COMMUNITY)
Admission: RE | Admit: 2020-10-07 | Discharge: 2020-10-07 | Disposition: A | Discharge status: Home / Self Care | Payer: Medicare Other | Source: Ambulatory Visit | Attending: Physician Assistant | Admitting: Physician Assistant

## 2020-10-07 ENCOUNTER — Encounter (HOSPITAL_COMMUNITY): Payer: Self-pay | Admitting: Physician Assistant

## 2020-10-07 VITALS — BP 130/68 | HR 66 | Ht 68.0 in | Wt 232.4 lb

## 2020-10-07 DIAGNOSIS — I480 Paroxysmal atrial fibrillation: Secondary | ICD-10-CM | POA: Diagnosis not present

## 2020-10-07 DIAGNOSIS — I5032 Chronic diastolic (congestive) heart failure: Secondary | ICD-10-CM | POA: Diagnosis not present

## 2020-10-07 DIAGNOSIS — I132 Hypertensive heart and chronic kidney disease with heart failure and with stage 5 chronic kidney disease, or end stage renal disease: Secondary | ICD-10-CM | POA: Insufficient documentation

## 2020-10-07 DIAGNOSIS — D6869 Other thrombophilia: Secondary | ICD-10-CM

## 2020-10-07 DIAGNOSIS — E669 Obesity, unspecified: Secondary | ICD-10-CM | POA: Insufficient documentation

## 2020-10-07 DIAGNOSIS — I2510 Atherosclerotic heart disease of native coronary artery without angina pectoris: Secondary | ICD-10-CM | POA: Diagnosis not present

## 2020-10-07 DIAGNOSIS — Z8249 Family history of ischemic heart disease and other diseases of the circulatory system: Secondary | ICD-10-CM | POA: Insufficient documentation

## 2020-10-07 DIAGNOSIS — Z8673 Personal history of transient ischemic attack (TIA), and cerebral infarction without residual deficits: Secondary | ICD-10-CM | POA: Diagnosis not present

## 2020-10-07 DIAGNOSIS — Z79899 Other long term (current) drug therapy: Secondary | ICD-10-CM | POA: Insufficient documentation

## 2020-10-07 DIAGNOSIS — Z888 Allergy status to other drugs, medicaments and biological substances status: Secondary | ICD-10-CM | POA: Diagnosis not present

## 2020-10-07 DIAGNOSIS — Z992 Dependence on renal dialysis: Secondary | ICD-10-CM | POA: Diagnosis not present

## 2020-10-07 DIAGNOSIS — I4892 Unspecified atrial flutter: Secondary | ICD-10-CM

## 2020-10-07 DIAGNOSIS — N186 End stage renal disease: Secondary | ICD-10-CM | POA: Insufficient documentation

## 2020-10-07 DIAGNOSIS — Z7901 Long term (current) use of anticoagulants: Secondary | ICD-10-CM | POA: Diagnosis not present

## 2020-10-07 DIAGNOSIS — Z6835 Body mass index (BMI) 35.0-35.9, adult: Secondary | ICD-10-CM | POA: Diagnosis not present

## 2020-10-07 NOTE — Progress Notes (Signed)
Primary Care Physician: Lois Huxley, PA Primary Cardiologist: Dr Oval Linsey Primary Electrophysiologist: Dr Rayann Heman Referring Physician: Dr Gerre Pebbles Erin Dean is a 55 y.o. female with a history of ESRF on HD, malignant HTN, obesity, chronic CHF (diastolic), stroke ([id number] and 2021), SVT, CAD, atrial fibrillation who presents for follow up in the [facility].  The patient was initially diagnosed with atrial fibrillation 06/2020 after presenting to the hospital with symptoms of chest pain. She converted on her own prior to medications. CP resolved with conversion to NSR. Given chest pain and EKG changes, she underwent myoview which was high risk. LHC showed 95% ostial-proximal LAD stenosis followed by 50% stenosis, 60% distal Cx, and mild disease in the RCA. She had orbital atherectomy and DES to ostial-proximal LAD on 06/22/20. Patient is on Eliquis for a CHADS2VASC score of 6. Patient seen in ED 09/06/20 with afib with RVR and converted to SR with IV diltiazem. Returned to ED 09/13/20 and underwent DCCV and started on amiodarone. Admitted 09/20/20 for afib but converted after lorazepam was given in the ED. Her amiodarone was increased for quicker loading.  On follow up today, patient reports she is feeling much better today. She converted to SR several hours after her last visit. She has had two additional episodes of heart racing but these where much shorter. She denies any bleeding issues on anticoagulation.   Today, she denies symptoms of chest pain, shortness of breath, orthopnea, PND, lower extremity edema, dizziness, presyncope, syncope, snoring, daytime somnolence, bleeding, or neurologic sequela. The patient is tolerating medications without difficulties and is otherwise without complaint today.    Atrial Fibrillation Risk Factors:  she does not have symptoms or diagnosis of sleep apnea. she does not have a history of rheumatic fever. she does not have a  history of alcohol use. The patient does have a history of early familial atrial fibrillation or other arrhythmias. Sister has afib  she has a BMI of Body mass index is 35.34 kg/m.Marland Kitchen Filed Weights   10/07/20 0928  Weight: 105.4 kg     Family History  Problem Relation Age of Onset   Heart attack Maternal Uncle    Heart attack Maternal Grandfather    Stroke Paternal Grandmother    Cancer Father    Hypertension Sister      Atrial Fibrillation Management history:  Previous antiarrhythmic drugs: amiodarone  Previous cardioversions: 09/13/20 Previous ablations: none CHADS2VASC score: 6 Anticoagulation history: Eliquis   Past Medical History:  Diagnosis Date   ESRD (end stage renal disease) ([place])    TTHS [place]    Hypertension    Stroke ([place]) 04/2017   no residual . Limp left side   SVT (supraventricular tachycardia) ([place])    Past Surgical History:  Procedure Laterality Date   AV FISTULA PLACEMENT Right 08/16/2017   Procedure: CREATION OF RADIOCEPHALIC VERSUS BRACHIOCEPHALIC ARTERIOVENOUS FISTULA RIGHT ARM;  Surgeon: [place], MD;  Location[facility] OR;  Service: Vascular;  Laterality: Right;   AV FISTULA PLACEMENT Right 02/07/2018   Procedure: Creation Right arm Brachiocephalic Fistula;  Surgeon: Marty Heck, MD;  Location: [place];  Service: Vascular;  Laterality: Right;   AV FISTULA PLACEMENT Left 08/15/2018   Procedure: ARTERIOVENOUS (AV) FISTULA CREATION;  Surgeon: Serafina Mitchell, MD;  Location: [place];  Service: Vascular;  Laterality: Left;   [place] Right 04/07/2018   Procedure: SECOND STAGE BASILIC VEIN TRANSPOSITION RIGHT ARM;  Surgeon: Marty Heck, MD;  Location: [facility] OR;  Service: Vascular;  Laterality: Right;   CESAREAN SECTION     CORONARY ATHERECTOMY N/A 06/22/2020   Procedure: CORONARY ATHERECTOMY;  Surgeon: [place], MD;  Location: [place] [facility];  Service: Cardiovascular;  Laterality: N/A;   CORONARY STENT  INTERVENTION N/A 06/22/2020   Procedure: CORONARY STENT INTERVENTION;  Surgeon: Wellington Hampshire, MD;  Location: [place] [facility];  Service: Cardiovascular;  Laterality: N/A;   ESOPHAGOGASTRODUODENOSCOPY N/A 04/19/2017   Procedure: ESOPHAGOGASTRODUODENOSCOPY (EGD);  Surgeon: Carol Ada, MD;  Location: [place];  Service: Endoscopy;  Laterality: N/A;   FISTULA SUPERFICIALIZATION Right 10/01/2017   Procedure: FISTULA SUPERFICIALIZATION RIGHT ARM;  Surgeon: [place], MD;  Location: [place];  Service: Vascular;  Laterality: Right;   FISTULA SUPERFICIALIZATION Left 10/08/2018   Procedure: FISTULA SUPERFICIALIZATION LEFT ARM;  Surgeon: Serafina Mitchell, MD;  Location: [place];  Service: Vascular;  Laterality: Left;   INTRAVASCULAR IMAGING/OCT N/A 06/22/2020   Procedure: INTRAVASCULAR IMAGING/OCT;  Surgeon: Wellington Hampshire, MD;  Location: [place] [facility];  Service: Cardiovascular;  Laterality: N/A;   IR FLUORO GUIDE CV LINE RIGHT  04/23/2018   IR US GUIDE VASC ACCESS RIGHT  04/23/2018   LEFT HEART CATH AND CORONARY ANGIOGRAPHY N/A 06/20/2020   Procedure: LEFT HEART CATH AND CORONARY ANGIOGRAPHY;  Surgeon: Nelva Bush, MD;  Location: [place] [facility];  Service: Cardiovascular;  Laterality: N/A;   LOOP RECORDER INSERTION N/A 04/22/2017   Procedure: LOOP RECORDER INSERTION;  Surgeon: Constance Haw, MD;  Location: [place] [facility];  Service: Cardiovascular;  Laterality: N/A;   RADIOLOGY WITH ANESTHESIA N/A 04/01/2020   Procedure: MRI W/O CONSTRAST  WITH ANESTHESIA;  Surgeon: Radiologist, Medication, MD;  Location: [place];  Service: Radiology;  Laterality: N/A;   TEE WITHOUT CARDIOVERSION N/A 04/19/2017   Procedure: TRANSESOPHAGEAL ECHOCARDIOGRAM (TEE);  Surgeon: Carol Ada, MD;  Location: [place];  Service: Endoscopy;  Laterality: N/A;    Current Outpatient Medications  Medication Sig Dispense Refill   acetaminophen (TYLENOL) 325 MG tablet Take 2 tablets (650 mg total) by  mouth every 6 (six) hours as needed for mild pain (or Fever >/= 101).     ALPRAZolam (XANAX) 0.5 MG tablet Take 0.5 mg by mouth daily as needed for anxiety.      amiodarone (PACERONE) 200 MG tablet Take 2 tablets (400 mg total) by mouth daily. 60 tablet 0   amLODipine (NORVASC) 10 MG tablet Take 1 tablet (10 mg total) by mouth daily. 180 tablet 3   apixaban (ELIQUIS) 5 MG TABS tablet Take 1 tablet (5 mg total) by mouth 2 (two) times daily. 60 tablet 12   atorvastatin (LIPITOR) 80 MG tablet Take 1 tablet (80 mg total) by mouth daily at 6 PM. 30 tablet 0   calcitRIOL (ROCALTROL) 0.25 MCG capsule Take 1 capsule (0.25 mcg total) by mouth daily. 30 capsule 2   carvedilol (COREG) 25 MG tablet Take 1 tablet (25 mg total) by mouth 2 (two) times daily with a meal. 60 tablet 2   Cinacalcet HCl (SENSIPAR PO) Take 1 tablet by mouth See admin instructions. Given at dialysis. Tuesday, Thursday, and Saturday.     clopidogrel (PLAVIX) 75 MG tablet Take 1 tablet (75 mg total) by mouth daily before breakfast. 30 tablet 12   cyanocobalamin 1000 MCG tablet Take 1 tablet (1,000 mcg total) by mouth daily. 30 tablet 2   ferrous sulfate 325 (65 FE) MG tablet Take 1 tablet (325 mg total)  by mouth 3 (three) times daily with meals. 90 tablet 2   nitroGLYCERIN (NITROSTAT) 0.4 MG SL tablet Place 0.4 mg under the tongue every 5 (five) minutes x 3 doses as needed for chest pain.     omeprazole (PRILOSEC) 20 MG capsule Take 20 mg by mouth daily.     RENAGEL 800 MG tablet Take 1,600-3,200 mg by mouth See admin instructions. 3200  mg with meals 1600 mg with snacks     sertraline (ZOLOFT) 25 MG tablet Take 25 mg by mouth daily.     No current facility-administered medications for this encounter.    Allergies  Allergen Reactions   Chlorhexidine Gluconate Rash    Social History   Socioeconomic History   Marital status: Legally Separated    Spouse name: Not on file   Number of children: Not on file   Years of education:  Not on file   Highest education level: Not on file  Occupational History   Not on file  Tobacco Use   Smoking status: Never   Smokeless tobacco: Never  Vaping Use   Vaping Use: Never used  Substance and Sexual Activity   Alcohol use: No   Drug use: No   Sexual activity: Not on file  Other Topics Concern   Not on file  Social History Narrative   Lives with daughter   Education- high school   On temp disability   Social Determinants of Health   Financial Resource Strain: Not on file  Food Insecurity: Not on file  Transportation Needs: Not on file  Physical Activity: Not on file  Stress: Not on file  Social Connections: Not on file  Intimate Partner Violence: Not on file     ROS- All systems are reviewed and negative except as per the HPI above.  Physical Exam: Vitals:   10/07/20 0928  BP: 130/68  Pulse: 66  Weight: 105.4 kg  Height: '5\' 8"'$  (1.727 m)    GEN- The patient is a well appearing obese female, alert and oriented x 3 today.   HEENT-head normocephalic, atraumatic, sclera clear, conjunctiva pink, hearing intact, trachea midline. Lungs- Clear to ausculation bilaterally, normal work of breathing Heart- Regular rate and rhythm, no murmurs, rubs or gallops  GI- soft, NT, ND, + BS Extremities- no clubbing, cyanosis, or edema MS- no significant deformity or atrophy Skin- no rash or lesion Psych- euthymic mood, full affect Neuro- strength and sensation are intact   Wt Readings from Last 3 Encounters:  10/07/20 105.4 kg  10/03/20 108 kg  09/29/20 96.2 kg    EKG today demonstrates  SR Vent. rate 66 BPM PR interval 208 ms QRS duration 102 ms QT/QTcB 486/509 ms  Echo 06/19/20 demonstrated   1. The aortic valve is tricuspid and the leaflets are moderately  calcified. There is trivial aortic regurgitation. There is no apparent  vegetation on the current study. It appears to just be calcium on the valve. Additionally, there is no significant regurgitation,  which goes against a destructive lesion such as endocarditis. If there are clinical concerns for endocarditis, would recommend a TEE. The aortic valve is tricuspid. There is moderate calcification of the aortic valve. There is moderate thickening of the aortic valve. Aortic valve regurgitation is trivial. Mild to moderate aortic valve sclerosis/calcification is present, without any evidence of aortic stenosis.   2. Left ventricular ejection fraction, by estimation, is 60 to 65%. The left ventricle has normal function. The left ventricle has no regional wall motion abnormalities.  3. A small pericardial effusion is present. The pericardial effusion is circumferential.   4. The mitral valve is degenerative. Mild mitral valve regurgitation. No evidence of mitral stenosis.   5. There is mildly elevated pulmonary artery systolic pressure.   Epic records are reviewed at length today  CHA2DS2-VASc Score = 6  The patient's score is based upon: CHF History: Yes HTN History: Yes Diabetes History: No Stroke History: Yes Vascular Disease History: Yes Age Score: 0 Gender Score: 1      ASSESSMENT AND PLAN: 1. Paroxysmal Atrial Fibrillation/atrial flutter The patient's CHA2DS2-VASc score is 6, indicating a 9.7% annual risk of stroke.   Patient back in SR. Continue amiodarone 400 mg BID until 7/4 then decrease to 200 mg BID x 2 weeks then decrease to 200 mg daily unless otherwise directed by Dr Willis Modena.   Continue Eliquis 5 mg BID Continue carvedilol 25 mg BID  2. Secondary Hypercoagulable State (ICD10:  D68.69) The patient is at significant risk for stroke/thromboembolism based upon her CHA2DS2-VASc Score of 6.  Continue Apixaban (Eliquis).   3. Obesity Body mass index is 35.34 kg/m. Lifestyle modification was discussed and encouraged including regular physical activity and weight reduction.  4. CAD No anginal symptoms.  5. HTN Stable, no changes today.  6. ESRD HD on Tu/Th/Sat   Follow  up with Dr Willis Modena on 7/29. AF clinic in 6 months.    [facility] [address] [place], [place] [address] [phone] 10/07/2020 10:08 AM

## 2020-10-07 NOTE — Patient Instructions (Signed)
Monday, 7/4 reduce amiodarone to '200mg'$  twice a day Monday, 7/15 reduce amiodarone to '200mg'$  once a day

## 2020-10-11 DIAGNOSIS — N2581 Secondary hyperparathyroidism of renal origin: Secondary | ICD-10-CM | POA: Diagnosis not present

## 2020-10-11 DIAGNOSIS — Z992 Dependence on renal dialysis: Secondary | ICD-10-CM | POA: Diagnosis not present

## 2020-10-11 DIAGNOSIS — D689 Coagulation defect, unspecified: Secondary | ICD-10-CM | POA: Diagnosis not present

## 2020-10-11 DIAGNOSIS — R52 Pain, unspecified: Secondary | ICD-10-CM | POA: Diagnosis not present

## 2020-10-11 DIAGNOSIS — N186 End stage renal disease: Secondary | ICD-10-CM | POA: Diagnosis not present

## 2020-10-11 DIAGNOSIS — D631 Anemia in chronic kidney disease: Secondary | ICD-10-CM | POA: Diagnosis not present

## 2020-10-11 DIAGNOSIS — I770 Arteriovenous fistula, acquired: Secondary | ICD-10-CM | POA: Diagnosis not present

## 2020-10-12 ENCOUNTER — Telehealth (HOSPITAL_COMMUNITY): Payer: Self-pay | Admitting: *Deleted

## 2020-10-12 MED ORDER — AMIODARONE HCL 200 MG PO TABS: ORAL_TABLET | ORAL | 0 refills | Status: DC

## 2020-10-12 NOTE — Telephone Encounter (Signed)
Pt called in stating last evening she had a dizzy spell with a few seconds of fainting (witnessed by her granddaughter). Pt did have dialysis yesterday - when she finished her treatment she went to bed as she normally does - got up later that evening to fix dinner and felt dizzy with resulting in fainting. She checked her HR at the time it was 130s. She took her normal medications and felt better after. She did not hit her head with her episode. Today she feels better today just tired but HR is in the 70s. Discussed with Roderic Palau NP continue amiodarone load as prescribed. Pt to call back if she has recurrent symptoms.

## 2020-10-13 DIAGNOSIS — Z992 Dependence on renal dialysis: Secondary | ICD-10-CM | POA: Diagnosis not present

## 2020-10-13 DIAGNOSIS — D689 Coagulation defect, unspecified: Secondary | ICD-10-CM | POA: Diagnosis not present

## 2020-10-13 DIAGNOSIS — R52 Pain, unspecified: Secondary | ICD-10-CM | POA: Diagnosis not present

## 2020-10-13 DIAGNOSIS — D631 Anemia in chronic kidney disease: Secondary | ICD-10-CM | POA: Diagnosis not present

## 2020-10-13 DIAGNOSIS — N2581 Secondary hyperparathyroidism of renal origin: Secondary | ICD-10-CM | POA: Diagnosis not present

## 2020-10-13 DIAGNOSIS — N186 End stage renal disease: Secondary | ICD-10-CM | POA: Diagnosis not present

## 2020-10-13 DIAGNOSIS — I770 Arteriovenous fistula, acquired: Secondary | ICD-10-CM | POA: Diagnosis not present

## 2020-10-14 ENCOUNTER — Other Ambulatory Visit (HOSPITAL_COMMUNITY): Payer: Self-pay

## 2020-10-14 MED ORDER — AMIODARONE HCL 200 MG PO TABS: ORAL_TABLET | ORAL | 0 refills | Status: AC

## 2020-10-15 DIAGNOSIS — Z992 Dependence on renal dialysis: Secondary | ICD-10-CM | POA: Diagnosis not present

## 2020-10-15 DIAGNOSIS — N186 End stage renal disease: Secondary | ICD-10-CM | POA: Diagnosis not present

## 2020-10-15 DIAGNOSIS — R52 Pain, unspecified: Secondary | ICD-10-CM | POA: Diagnosis not present

## 2020-10-15 DIAGNOSIS — D689 Coagulation defect, unspecified: Secondary | ICD-10-CM | POA: Diagnosis not present

## 2020-10-15 DIAGNOSIS — I770 Arteriovenous fistula, acquired: Secondary | ICD-10-CM | POA: Diagnosis not present

## 2020-10-15 DIAGNOSIS — N2581 Secondary hyperparathyroidism of renal origin: Secondary | ICD-10-CM | POA: Diagnosis not present

## 2020-10-15 DIAGNOSIS — D631 Anemia in chronic kidney disease: Secondary | ICD-10-CM | POA: Diagnosis not present

## 2020-10-17 ENCOUNTER — Other Ambulatory Visit (HOSPITAL_COMMUNITY): Payer: Self-pay | Admitting: *Deleted

## 2020-10-17 MED ORDER — CARVEDILOL 25 MG PO TABS: 25.0000 mg | ORAL_TABLET | Freq: Two times a day (BID) | ORAL | 2 refills | Status: DC

## 2020-10-18 DIAGNOSIS — I770 Arteriovenous fistula, acquired: Secondary | ICD-10-CM | POA: Diagnosis not present

## 2020-10-18 DIAGNOSIS — D631 Anemia in chronic kidney disease: Secondary | ICD-10-CM | POA: Diagnosis not present

## 2020-10-18 DIAGNOSIS — R52 Pain, unspecified: Secondary | ICD-10-CM | POA: Diagnosis not present

## 2020-10-18 DIAGNOSIS — Z992 Dependence on renal dialysis: Secondary | ICD-10-CM | POA: Diagnosis not present

## 2020-10-18 DIAGNOSIS — N186 End stage renal disease: Secondary | ICD-10-CM | POA: Diagnosis not present

## 2020-10-18 DIAGNOSIS — N2581 Secondary hyperparathyroidism of renal origin: Secondary | ICD-10-CM | POA: Diagnosis not present

## 2020-10-18 DIAGNOSIS — D689 Coagulation defect, unspecified: Secondary | ICD-10-CM | POA: Diagnosis not present

## 2020-10-20 DIAGNOSIS — D689 Coagulation defect, unspecified: Secondary | ICD-10-CM | POA: Diagnosis not present

## 2020-10-20 DIAGNOSIS — Z992 Dependence on renal dialysis: Secondary | ICD-10-CM | POA: Diagnosis not present

## 2020-10-20 DIAGNOSIS — N2581 Secondary hyperparathyroidism of renal origin: Secondary | ICD-10-CM | POA: Diagnosis not present

## 2020-10-20 DIAGNOSIS — I770 Arteriovenous fistula, acquired: Secondary | ICD-10-CM | POA: Diagnosis not present

## 2020-10-20 DIAGNOSIS — N186 End stage renal disease: Secondary | ICD-10-CM | POA: Diagnosis not present

## 2020-10-20 DIAGNOSIS — R52 Pain, unspecified: Secondary | ICD-10-CM | POA: Diagnosis not present

## 2020-10-20 DIAGNOSIS — D631 Anemia in chronic kidney disease: Secondary | ICD-10-CM | POA: Diagnosis not present

## 2020-10-22 DIAGNOSIS — D631 Anemia in chronic kidney disease: Secondary | ICD-10-CM | POA: Diagnosis not present

## 2020-10-22 DIAGNOSIS — D689 Coagulation defect, unspecified: Secondary | ICD-10-CM | POA: Diagnosis not present

## 2020-10-22 DIAGNOSIS — R52 Pain, unspecified: Secondary | ICD-10-CM | POA: Diagnosis not present

## 2020-10-22 DIAGNOSIS — N186 End stage renal disease: Secondary | ICD-10-CM | POA: Diagnosis not present

## 2020-10-22 DIAGNOSIS — Z992 Dependence on renal dialysis: Secondary | ICD-10-CM | POA: Diagnosis not present

## 2020-10-22 DIAGNOSIS — I770 Arteriovenous fistula, acquired: Secondary | ICD-10-CM | POA: Diagnosis not present

## 2020-10-22 DIAGNOSIS — N2581 Secondary hyperparathyroidism of renal origin: Secondary | ICD-10-CM | POA: Diagnosis not present

## 2020-10-25 DIAGNOSIS — I770 Arteriovenous fistula, acquired: Secondary | ICD-10-CM | POA: Diagnosis not present

## 2020-10-25 DIAGNOSIS — D631 Anemia in chronic kidney disease: Secondary | ICD-10-CM | POA: Diagnosis not present

## 2020-10-25 DIAGNOSIS — N2581 Secondary hyperparathyroidism of renal origin: Secondary | ICD-10-CM | POA: Diagnosis not present

## 2020-10-25 DIAGNOSIS — N186 End stage renal disease: Secondary | ICD-10-CM | POA: Diagnosis not present

## 2020-10-25 DIAGNOSIS — D689 Coagulation defect, unspecified: Secondary | ICD-10-CM | POA: Diagnosis not present

## 2020-10-25 DIAGNOSIS — R52 Pain, unspecified: Secondary | ICD-10-CM | POA: Diagnosis not present

## 2020-10-25 DIAGNOSIS — Z992 Dependence on renal dialysis: Secondary | ICD-10-CM | POA: Diagnosis not present

## 2020-10-27 DIAGNOSIS — D631 Anemia in chronic kidney disease: Secondary | ICD-10-CM | POA: Diagnosis not present

## 2020-10-27 DIAGNOSIS — N2581 Secondary hyperparathyroidism of renal origin: Secondary | ICD-10-CM | POA: Diagnosis not present

## 2020-10-27 DIAGNOSIS — N186 End stage renal disease: Secondary | ICD-10-CM | POA: Diagnosis not present

## 2020-10-27 DIAGNOSIS — D689 Coagulation defect, unspecified: Secondary | ICD-10-CM | POA: Diagnosis not present

## 2020-10-27 DIAGNOSIS — Z992 Dependence on renal dialysis: Secondary | ICD-10-CM | POA: Diagnosis not present

## 2020-10-27 DIAGNOSIS — I770 Arteriovenous fistula, acquired: Secondary | ICD-10-CM | POA: Diagnosis not present

## 2020-10-27 DIAGNOSIS — R52 Pain, unspecified: Secondary | ICD-10-CM | POA: Diagnosis not present

## 2020-11-01 DIAGNOSIS — Z992 Dependence on renal dialysis: Secondary | ICD-10-CM | POA: Diagnosis not present

## 2020-11-01 DIAGNOSIS — R52 Pain, unspecified: Secondary | ICD-10-CM | POA: Diagnosis not present

## 2020-11-01 DIAGNOSIS — N186 End stage renal disease: Secondary | ICD-10-CM | POA: Diagnosis not present

## 2020-11-01 DIAGNOSIS — I770 Arteriovenous fistula, acquired: Secondary | ICD-10-CM | POA: Diagnosis not present

## 2020-11-01 DIAGNOSIS — D689 Coagulation defect, unspecified: Secondary | ICD-10-CM | POA: Diagnosis not present

## 2020-11-01 DIAGNOSIS — N2581 Secondary hyperparathyroidism of renal origin: Secondary | ICD-10-CM | POA: Diagnosis not present

## 2020-11-01 DIAGNOSIS — D631 Anemia in chronic kidney disease: Secondary | ICD-10-CM | POA: Diagnosis not present

## 2020-11-03 DIAGNOSIS — D631 Anemia in chronic kidney disease: Secondary | ICD-10-CM | POA: Diagnosis not present

## 2020-11-03 DIAGNOSIS — R52 Pain, unspecified: Secondary | ICD-10-CM | POA: Diagnosis not present

## 2020-11-03 DIAGNOSIS — N2581 Secondary hyperparathyroidism of renal origin: Secondary | ICD-10-CM | POA: Diagnosis not present

## 2020-11-03 DIAGNOSIS — D689 Coagulation defect, unspecified: Secondary | ICD-10-CM | POA: Diagnosis not present

## 2020-11-03 DIAGNOSIS — N186 End stage renal disease: Secondary | ICD-10-CM | POA: Diagnosis not present

## 2020-11-03 DIAGNOSIS — I770 Arteriovenous fistula, acquired: Secondary | ICD-10-CM | POA: Diagnosis not present

## 2020-11-03 DIAGNOSIS — Z992 Dependence on renal dialysis: Secondary | ICD-10-CM | POA: Diagnosis not present

## 2020-11-05 DIAGNOSIS — N2581 Secondary hyperparathyroidism of renal origin: Secondary | ICD-10-CM | POA: Diagnosis not present

## 2020-11-05 DIAGNOSIS — R52 Pain, unspecified: Secondary | ICD-10-CM | POA: Diagnosis not present

## 2020-11-05 DIAGNOSIS — Z992 Dependence on renal dialysis: Secondary | ICD-10-CM | POA: Diagnosis not present

## 2020-11-05 DIAGNOSIS — D631 Anemia in chronic kidney disease: Secondary | ICD-10-CM | POA: Diagnosis not present

## 2020-11-05 DIAGNOSIS — D689 Coagulation defect, unspecified: Secondary | ICD-10-CM | POA: Diagnosis not present

## 2020-11-05 DIAGNOSIS — N186 End stage renal disease: Secondary | ICD-10-CM | POA: Diagnosis not present

## 2020-11-05 DIAGNOSIS — I770 Arteriovenous fistula, acquired: Secondary | ICD-10-CM | POA: Diagnosis not present

## 2020-11-06 DIAGNOSIS — N186 End stage renal disease: Secondary | ICD-10-CM | POA: Diagnosis not present

## 2020-11-06 DIAGNOSIS — I129 Hypertensive chronic kidney disease with stage 1 through stage 4 chronic kidney disease, or unspecified chronic kidney disease: Secondary | ICD-10-CM | POA: Diagnosis not present

## 2020-11-06 DIAGNOSIS — Z992 Dependence on renal dialysis: Secondary | ICD-10-CM | POA: Diagnosis not present

## 2020-11-10 DIAGNOSIS — I770 Arteriovenous fistula, acquired: Secondary | ICD-10-CM | POA: Diagnosis not present

## 2020-11-10 DIAGNOSIS — R52 Pain, unspecified: Secondary | ICD-10-CM | POA: Diagnosis not present

## 2020-11-10 DIAGNOSIS — D631 Anemia in chronic kidney disease: Secondary | ICD-10-CM | POA: Diagnosis not present

## 2020-11-10 DIAGNOSIS — Z992 Dependence on renal dialysis: Secondary | ICD-10-CM | POA: Diagnosis not present

## 2020-11-10 DIAGNOSIS — N2581 Secondary hyperparathyroidism of renal origin: Secondary | ICD-10-CM | POA: Diagnosis not present

## 2020-11-10 DIAGNOSIS — N186 End stage renal disease: Secondary | ICD-10-CM | POA: Diagnosis not present

## 2020-11-10 DIAGNOSIS — D689 Coagulation defect, unspecified: Secondary | ICD-10-CM | POA: Diagnosis not present

## 2020-11-12 DIAGNOSIS — N186 End stage renal disease: Secondary | ICD-10-CM | POA: Diagnosis not present

## 2020-11-12 DIAGNOSIS — D631 Anemia in chronic kidney disease: Secondary | ICD-10-CM | POA: Diagnosis not present

## 2020-11-12 DIAGNOSIS — D689 Coagulation defect, unspecified: Secondary | ICD-10-CM | POA: Diagnosis not present

## 2020-11-12 DIAGNOSIS — N2581 Secondary hyperparathyroidism of renal origin: Secondary | ICD-10-CM | POA: Diagnosis not present

## 2020-11-12 DIAGNOSIS — R52 Pain, unspecified: Secondary | ICD-10-CM | POA: Diagnosis not present

## 2020-11-12 DIAGNOSIS — Z992 Dependence on renal dialysis: Secondary | ICD-10-CM | POA: Diagnosis not present

## 2020-11-12 DIAGNOSIS — I770 Arteriovenous fistula, acquired: Secondary | ICD-10-CM | POA: Diagnosis not present

## 2020-11-15 DIAGNOSIS — I770 Arteriovenous fistula, acquired: Secondary | ICD-10-CM | POA: Diagnosis not present

## 2020-11-15 DIAGNOSIS — D689 Coagulation defect, unspecified: Secondary | ICD-10-CM | POA: Diagnosis not present

## 2020-11-15 DIAGNOSIS — N2581 Secondary hyperparathyroidism of renal origin: Secondary | ICD-10-CM | POA: Diagnosis not present

## 2020-11-15 DIAGNOSIS — Z992 Dependence on renal dialysis: Secondary | ICD-10-CM | POA: Diagnosis not present

## 2020-11-15 DIAGNOSIS — N186 End stage renal disease: Secondary | ICD-10-CM | POA: Diagnosis not present

## 2020-11-15 DIAGNOSIS — R52 Pain, unspecified: Secondary | ICD-10-CM | POA: Diagnosis not present

## 2020-11-15 DIAGNOSIS — D631 Anemia in chronic kidney disease: Secondary | ICD-10-CM | POA: Diagnosis not present

## 2020-11-16 DIAGNOSIS — Z992 Dependence on renal dialysis: Secondary | ICD-10-CM | POA: Diagnosis not present

## 2020-11-16 DIAGNOSIS — I871 Compression of vein: Secondary | ICD-10-CM | POA: Diagnosis not present

## 2020-11-16 DIAGNOSIS — N186 End stage renal disease: Secondary | ICD-10-CM | POA: Diagnosis not present

## 2020-11-16 DIAGNOSIS — T82858A Stenosis of vascular prosthetic devices, implants and grafts, initial encounter: Secondary | ICD-10-CM | POA: Diagnosis not present

## 2020-11-19 DIAGNOSIS — N2581 Secondary hyperparathyroidism of renal origin: Secondary | ICD-10-CM | POA: Diagnosis not present

## 2020-11-19 DIAGNOSIS — N186 End stage renal disease: Secondary | ICD-10-CM | POA: Diagnosis not present

## 2020-11-19 DIAGNOSIS — R52 Pain, unspecified: Secondary | ICD-10-CM | POA: Diagnosis not present

## 2020-11-19 DIAGNOSIS — Z992 Dependence on renal dialysis: Secondary | ICD-10-CM | POA: Diagnosis not present

## 2020-11-19 DIAGNOSIS — D689 Coagulation defect, unspecified: Secondary | ICD-10-CM | POA: Diagnosis not present

## 2020-11-19 DIAGNOSIS — I770 Arteriovenous fistula, acquired: Secondary | ICD-10-CM | POA: Diagnosis not present

## 2020-11-19 DIAGNOSIS — D631 Anemia in chronic kidney disease: Secondary | ICD-10-CM | POA: Diagnosis not present

## 2020-11-22 DIAGNOSIS — R52 Pain, unspecified: Secondary | ICD-10-CM | POA: Diagnosis not present

## 2020-11-22 DIAGNOSIS — D631 Anemia in chronic kidney disease: Secondary | ICD-10-CM | POA: Diagnosis not present

## 2020-11-22 DIAGNOSIS — N186 End stage renal disease: Secondary | ICD-10-CM | POA: Diagnosis not present

## 2020-11-22 DIAGNOSIS — D689 Coagulation defect, unspecified: Secondary | ICD-10-CM | POA: Diagnosis not present

## 2020-11-22 DIAGNOSIS — I770 Arteriovenous fistula, acquired: Secondary | ICD-10-CM | POA: Diagnosis not present

## 2020-11-22 DIAGNOSIS — N2581 Secondary hyperparathyroidism of renal origin: Secondary | ICD-10-CM | POA: Diagnosis not present

## 2020-11-22 DIAGNOSIS — Z992 Dependence on renal dialysis: Secondary | ICD-10-CM | POA: Diagnosis not present

## 2020-11-24 DIAGNOSIS — Z992 Dependence on renal dialysis: Secondary | ICD-10-CM | POA: Diagnosis not present

## 2020-11-24 DIAGNOSIS — R52 Pain, unspecified: Secondary | ICD-10-CM | POA: Diagnosis not present

## 2020-11-24 DIAGNOSIS — D631 Anemia in chronic kidney disease: Secondary | ICD-10-CM | POA: Diagnosis not present

## 2020-11-24 DIAGNOSIS — I770 Arteriovenous fistula, acquired: Secondary | ICD-10-CM | POA: Diagnosis not present

## 2020-11-24 DIAGNOSIS — N186 End stage renal disease: Secondary | ICD-10-CM | POA: Diagnosis not present

## 2020-11-24 DIAGNOSIS — D689 Coagulation defect, unspecified: Secondary | ICD-10-CM | POA: Diagnosis not present

## 2020-11-24 DIAGNOSIS — N2581 Secondary hyperparathyroidism of renal origin: Secondary | ICD-10-CM | POA: Diagnosis not present

## 2020-11-26 DIAGNOSIS — N186 End stage renal disease: Secondary | ICD-10-CM | POA: Diagnosis not present

## 2020-11-26 DIAGNOSIS — Z992 Dependence on renal dialysis: Secondary | ICD-10-CM | POA: Diagnosis not present

## 2020-11-26 DIAGNOSIS — D631 Anemia in chronic kidney disease: Secondary | ICD-10-CM | POA: Diagnosis not present

## 2020-11-26 DIAGNOSIS — I770 Arteriovenous fistula, acquired: Secondary | ICD-10-CM | POA: Diagnosis not present

## 2020-11-26 DIAGNOSIS — N2581 Secondary hyperparathyroidism of renal origin: Secondary | ICD-10-CM | POA: Diagnosis not present

## 2020-11-26 DIAGNOSIS — D689 Coagulation defect, unspecified: Secondary | ICD-10-CM | POA: Diagnosis not present

## 2020-11-26 DIAGNOSIS — R52 Pain, unspecified: Secondary | ICD-10-CM | POA: Diagnosis not present

## 2020-11-29 DIAGNOSIS — D689 Coagulation defect, unspecified: Secondary | ICD-10-CM | POA: Diagnosis not present

## 2020-11-29 DIAGNOSIS — R52 Pain, unspecified: Secondary | ICD-10-CM | POA: Diagnosis not present

## 2020-11-29 DIAGNOSIS — Z992 Dependence on renal dialysis: Secondary | ICD-10-CM | POA: Diagnosis not present

## 2020-11-29 DIAGNOSIS — N2581 Secondary hyperparathyroidism of renal origin: Secondary | ICD-10-CM | POA: Diagnosis not present

## 2020-11-29 DIAGNOSIS — D631 Anemia in chronic kidney disease: Secondary | ICD-10-CM | POA: Diagnosis not present

## 2020-11-29 DIAGNOSIS — I770 Arteriovenous fistula, acquired: Secondary | ICD-10-CM | POA: Diagnosis not present

## 2020-11-29 DIAGNOSIS — N186 End stage renal disease: Secondary | ICD-10-CM | POA: Diagnosis not present

## 2020-12-01 DIAGNOSIS — N186 End stage renal disease: Secondary | ICD-10-CM | POA: Diagnosis not present

## 2020-12-01 DIAGNOSIS — I770 Arteriovenous fistula, acquired: Secondary | ICD-10-CM | POA: Diagnosis not present

## 2020-12-01 DIAGNOSIS — R52 Pain, unspecified: Secondary | ICD-10-CM | POA: Diagnosis not present

## 2020-12-01 DIAGNOSIS — D689 Coagulation defect, unspecified: Secondary | ICD-10-CM | POA: Diagnosis not present

## 2020-12-01 DIAGNOSIS — Z992 Dependence on renal dialysis: Secondary | ICD-10-CM | POA: Diagnosis not present

## 2020-12-01 DIAGNOSIS — D631 Anemia in chronic kidney disease: Secondary | ICD-10-CM | POA: Diagnosis not present

## 2020-12-01 DIAGNOSIS — N2581 Secondary hyperparathyroidism of renal origin: Secondary | ICD-10-CM | POA: Diagnosis not present

## 2020-12-06 DIAGNOSIS — D689 Coagulation defect, unspecified: Secondary | ICD-10-CM | POA: Diagnosis not present

## 2020-12-06 DIAGNOSIS — N2581 Secondary hyperparathyroidism of renal origin: Secondary | ICD-10-CM | POA: Diagnosis not present

## 2020-12-06 DIAGNOSIS — D631 Anemia in chronic kidney disease: Secondary | ICD-10-CM | POA: Diagnosis not present

## 2020-12-06 DIAGNOSIS — R52 Pain, unspecified: Secondary | ICD-10-CM | POA: Diagnosis not present

## 2020-12-06 DIAGNOSIS — Z992 Dependence on renal dialysis: Secondary | ICD-10-CM | POA: Diagnosis not present

## 2020-12-06 DIAGNOSIS — N186 End stage renal disease: Secondary | ICD-10-CM | POA: Diagnosis not present

## 2020-12-06 DIAGNOSIS — I770 Arteriovenous fistula, acquired: Secondary | ICD-10-CM | POA: Diagnosis not present

## 2020-12-07 ENCOUNTER — Encounter (HOSPITAL_COMMUNITY): Payer: Self-pay

## 2020-12-07 ENCOUNTER — Telehealth (HOSPITAL_COMMUNITY): Payer: Self-pay | Admitting: *Deleted

## 2020-12-07 DIAGNOSIS — N186 End stage renal disease: Secondary | ICD-10-CM | POA: Diagnosis not present

## 2020-12-07 DIAGNOSIS — Z992 Dependence on renal dialysis: Secondary | ICD-10-CM | POA: Diagnosis not present

## 2020-12-07 DIAGNOSIS — I129 Hypertensive chronic kidney disease with stage 1 through stage 4 chronic kidney disease, or unspecified chronic kidney disease: Secondary | ICD-10-CM | POA: Diagnosis not present

## 2020-12-07 NOTE — Telephone Encounter (Signed)
Received notification from [facility] multiple attempts to schedule consult were made without success.  Referral will remain open for 6 months. Should pt decide to proceed # [phone].

## 2020-12-08 DIAGNOSIS — D509 Iron deficiency anemia, unspecified: Secondary | ICD-10-CM | POA: Diagnosis not present

## 2020-12-08 DIAGNOSIS — Z992 Dependence on renal dialysis: Secondary | ICD-10-CM | POA: Diagnosis not present

## 2020-12-08 DIAGNOSIS — D631 Anemia in chronic kidney disease: Secondary | ICD-10-CM | POA: Diagnosis not present

## 2020-12-08 DIAGNOSIS — N2581 Secondary hyperparathyroidism of renal origin: Secondary | ICD-10-CM | POA: Diagnosis not present

## 2020-12-08 DIAGNOSIS — I770 Arteriovenous fistula, acquired: Secondary | ICD-10-CM | POA: Diagnosis not present

## 2020-12-08 DIAGNOSIS — D689 Coagulation defect, unspecified: Secondary | ICD-10-CM | POA: Diagnosis not present

## 2020-12-08 DIAGNOSIS — R52 Pain, unspecified: Secondary | ICD-10-CM | POA: Diagnosis not present

## 2020-12-08 DIAGNOSIS — N186 End stage renal disease: Secondary | ICD-10-CM | POA: Diagnosis not present

## 2020-12-10 DIAGNOSIS — Z992 Dependence on renal dialysis: Secondary | ICD-10-CM | POA: Diagnosis not present

## 2020-12-10 DIAGNOSIS — D689 Coagulation defect, unspecified: Secondary | ICD-10-CM | POA: Diagnosis not present

## 2020-12-10 DIAGNOSIS — N2581 Secondary hyperparathyroidism of renal origin: Secondary | ICD-10-CM | POA: Diagnosis not present

## 2020-12-10 DIAGNOSIS — D509 Iron deficiency anemia, unspecified: Secondary | ICD-10-CM | POA: Diagnosis not present

## 2020-12-10 DIAGNOSIS — I770 Arteriovenous fistula, acquired: Secondary | ICD-10-CM | POA: Diagnosis not present

## 2020-12-10 DIAGNOSIS — N186 End stage renal disease: Secondary | ICD-10-CM | POA: Diagnosis not present

## 2020-12-10 DIAGNOSIS — D631 Anemia in chronic kidney disease: Secondary | ICD-10-CM | POA: Diagnosis not present

## 2020-12-10 DIAGNOSIS — R52 Pain, unspecified: Secondary | ICD-10-CM | POA: Diagnosis not present

## 2020-12-13 NOTE — Progress Notes (Deleted)
Cardiology Office Note   Date:  12/13/2020   ID:  Erin Dean, DOB 1965/09/06, MRN [id number]  PCP:  Erin Huxley, PA  Cardiologist:  Erin Latch, MD EP: None  No chief complaint on file.     History of Present Illness: Erin Dean is a 55 y.o. female with a PMH of CAD s/p PCI/DES to LAD [id number], chronic diastolic CHF, paroxysmal atrial fibrillation, malignant HTN, HLD, CVA, ESRD on HD, and obesity, who presents for ***  She has followed with cardiology periodically for several years for her difficult to manage HTN. This year she has had several admissions/ED visit pertaining to her heart. She presented 06/2020 with chest pain and was found to be in new onset atrial fibrillation. She converted spontaneously to sinus rhythm but was noted to have EKG changes. She underwent a NST which was high risk and ultimately underwent LHC 06/2020 which showed 95% ostial-prox LAD stenosis followed by 50% stenosis, 60% dLCx stenosis, and mild RCA disease. She underwent orbital atherectomy and DES to ostial-prox LAD. Echo at that time showed EF 60-65%, no RWMA, small pericardial effusion, mild MR, mildly elevated PA pressures, and moderate calcification sof the aortic valve without stenosis. She was subsequently seen in the ED 08/2020 for recurrent atrial fibrillation with RVR with conversion to sinus with IV diltiazem. She had successful DCCV 09/13/20 and was started on amiodarone. Again presented 1 week later 09/2020 with recurrent Afib which converted to sinus after receiving lorazepam prompting an increase in her amiodarone. She has followed with the atrial fibrillation clinic, last seen 10/07/20 and was doing well at that time.  She was given recommendations for amiodarone taper and recommended to follow-up in 6 months. She has had several missed or cancelled appointments with general cardiology since her hospitalization 06/2020.   1. CAD s/p PCI/DES to LAD 06/2020: no anginal complaints. Not on  aspirin due to need for [place].  - Continue plavix - Continue statin - Continue Bblocker and amlodipine  2. Paroxysmal atrial fibrillation: no recent palpitations - Continue amiodarone for rhythm control - Continue carvedilol for rate control - Continue apixaban for stroke ppx  3. Chronic diastolic CHF: no volume overload complaints.  - Continue volume management per nephrology with HD  4. HTN: BP *** today - Continue carvedilol and amlodipine  4. HLD: LDL *** - Continue atorvastatin  5. GERD: on omeprazole. Would benefit from alternative PPI given interaction between omeprazole and plavix - Will transition to pantoprazole for antacid ***  6. ESRD on HD: no missed sessions - Continue management per nephrology    Past Medical History:  Diagnosis Date   ESRD (end stage renal disease) ([place])    [facility]    Hypertension    Stroke ([place]) 04/2017   no residual . Limp left side   SVT (supraventricular tachycardia) ([place])     Past Surgical History:  Procedure Laterality Date   AV FISTULA PLACEMENT Right 08/16/2017   Procedure: CREATION OF RADIOCEPHALIC VERSUS BRACHIOCEPHALIC ARTERIOVENOUS FISTULA RIGHT ARM;  Surgeon: Conrad Evadale, MD;  Location: [facility];  Service: Vascular;  Laterality: Right;   AV FISTULA PLACEMENT Right 02/07/2018   Procedure: Creation Right arm Brachiocephalic Fistula;  Surgeon: Marty Heck, MD;  Location: [facility];  Service: Vascular;  Laterality: Right;   AV FISTULA PLACEMENT Left 08/15/2018   Procedure: ARTERIOVENOUS (AV) FISTULA CREATION;  Surgeon: Serafina Mitchell, MD;  Location: [place];  Service: Vascular;  Laterality: Left;   BASCILIC  VEIN TRANSPOSITION Right 04/07/2018   Procedure: SECOND STAGE BASILIC VEIN TRANSPOSITION RIGHT ARM;  Surgeon: Marty Heck, MD;  Location: [place];  Service: Vascular;  Laterality: Right;   CESAREAN SECTION     CORONARY ATHERECTOMY N/A 06/22/2020   Procedure: CORONARY ATHERECTOMY;  Surgeon: Wellington Hampshire, MD;   Location: [place] [facility];  Service: Cardiovascular;  Laterality: N/A;   CORONARY STENT INTERVENTION N/A 06/22/2020   Procedure: CORONARY STENT INTERVENTION;  Surgeon: Wellington Hampshire, MD;  Location: [place] [facility];  Service: Cardiovascular;  Laterality: N/A;   ESOPHAGOGASTRODUODENOSCOPY N/A 04/19/2017   Procedure: ESOPHAGOGASTRODUODENOSCOPY (EGD);  Surgeon: Carol Ada, MD;  Location: [facility];  Service: Endoscopy;  Laterality: N/A;   FISTULA SUPERFICIALIZATION Right 10/01/2017   Procedure: FISTULA SUPERFICIALIZATION RIGHT ARM;  Surgeon: Conrad [place], MD;  Location: [facility];  Service: Vascular;  Laterality: Right;   FISTULA SUPERFICIALIZATION Left 10/08/2018   Procedure: FISTULA SUPERFICIALIZATION LEFT ARM;  Surgeon: Serafina Mitchell, MD;  Location: [place];  Service: Vascular;  Laterality: Left;   INTRAVASCULAR IMAGING/OCT N/A 06/22/2020   Procedure: INTRAVASCULAR IMAGING/OCT;  Surgeon: Wellington Hampshire, MD;  Location: [place] [facility];  Service: Cardiovascular;  Laterality: N/A;   IR FLUORO GUIDE CV LINE RIGHT  04/23/2018   IR US GUIDE VASC ACCESS RIGHT  04/23/2018   LEFT HEART CATH AND CORONARY ANGIOGRAPHY N/A 06/20/2020   Procedure: LEFT HEART CATH AND CORONARY ANGIOGRAPHY;  Surgeon: Nelva Bush, MD;  Location: [facility];  Service: Cardiovascular;  Laterality: N/A;   LOOP RECORDER INSERTION N/A 04/22/2017   Procedure: LOOP RECORDER INSERTION;  Surgeon: Constance Haw, MD;  Location: [facility];  Service: Cardiovascular;  Laterality: N/A;   RADIOLOGY WITH ANESTHESIA N/A 04/01/2020   Procedure: MRI W/O CONSTRAST  WITH ANESTHESIA;  Surgeon: Radiologist, Medication, MD;  Location: [facility];  Service: Radiology;  Laterality: N/A;   TEE WITHOUT CARDIOVERSION N/A 04/19/2017   Procedure: TRANSESOPHAGEAL ECHOCARDIOGRAM (TEE);  Surgeon: Carol Ada, MD;  Location: [facility];  Service: Endoscopy;  Laterality: N/A;     Current Outpatient Medications  Medication Sig  Dispense Refill   acetaminophen (TYLENOL) 325 MG tablet Take 2 tablets (650 mg total) by mouth every 6 (six) hours as needed for mild pain (or Fever >/= 101).     ALPRAZolam (XANAX) 0.5 MG tablet Take 0.5 mg by mouth daily as needed for anxiety.      amiodarone (PACERONE) 200 MG tablet Take 1 tablet by mouth twice a day until 7/20 then reduce to 1 tablet daily 112 tablet 0   amLODipine (NORVASC) 10 MG tablet Take 1 tablet (10 mg total) by mouth daily. 180 tablet 3   apixaban (ELIQUIS) 5 MG TABS tablet Take 1 tablet (5 mg total) by mouth 2 (two) times daily. 60 tablet 12   atorvastatin (LIPITOR) 80 MG tablet Take 1 tablet (80 mg total) by mouth daily at 6 PM. 30 tablet 0   calcitRIOL (ROCALTROL) 0.25 MCG capsule Take 1 capsule (0.25 mcg total) by mouth daily. 30 capsule 2   carvedilol (COREG) 25 MG tablet Take 1 tablet (25 mg total) by mouth 2 (two) times daily with a meal. 60 tablet 2   clopidogrel (PLAVIX) 75 MG tablet Take 1 tablet (75 mg total) by mouth daily before breakfast. 30 tablet 12   cyanocobalamin 1000 MCG tablet Take 1 tablet (1,000 mcg total) by mouth daily. 30 tablet 2   ferrous sulfate 325 (65 FE) MG tablet  Take 1 tablet (325 mg total) by mouth 3 (three) times daily with meals. 90 tablet 2   nitroGLYCERIN (NITROSTAT) 0.4 MG SL tablet Place 0.4 mg under the tongue every 5 (five) minutes x 3 doses as needed for chest pain.     omeprazole (PRILOSEC) 20 MG capsule Take 20 mg by mouth daily.     RENAGEL 800 MG tablet Take 1,600-3,200 mg by mouth See admin instructions. 3200  mg with meals 1600 mg with snacks     sertraline (ZOLOFT) 25 MG tablet Take 25 mg by mouth daily.     No current facility-administered medications for this visit.    Allergies:   Chlorhexidine gluconate    Social History:  The patient  reports that she has never smoked. She has never used smokeless tobacco. She reports that she does not drink alcohol and does not use drugs.   Family History:  The patient's  ***family history includes Cancer in her father; Heart attack in her maternal grandfather and maternal uncle; Hypertension in her sister; Stroke in her paternal grandmother.    ROS:  Please see the history of present illness.   Otherwise, review of systems are positive for {NONE DEFAULTED:18576}.   All other systems are reviewed and negative.    PHYSICAL EXAM: VS:  LMP 08/12/2017  , BMI There is no height or weight on file to calculate BMI. GEN: Well nourished, well developed, in no acute distress HEENT: normal Neck: no JVD, carotid bruits, or masses Cardiac: ***RRR; no murmurs, rubs, or gallops,no edema  Respiratory:  clear to auscultation bilaterally, normal work of breathing GI: soft, nontender, nondistended, + BS MS: no deformity or atrophy Skin: warm and dry, no rash Neuro:  Strength and sensation are intact Psych: euthymic mood, full affect   EKG:  EKG {ACTION; IS/IS [id number] ordered today. The ekg ordered today demonstrates ***   Recent Labs: 06/18/2020: TSH 1.148 09/20/2020: Magnesium 2.0 09/29/2020: ALT 15; BUN 32; Creatinine, Ser 10.02; Hemoglobin 10.2; Platelets 181; Potassium 4.2; Sodium 137    Lipid Panel    Component Value Date/Time   CHOL 144 04/01/2020 0115   TRIG 72 04/01/2020 0115   HDL 33 (L) 04/01/2020 0115   CHOLHDL 4.4 04/01/2020 0115   VLDL 14 04/01/2020 0115   LDLCALC 97 04/01/2020 0115      Wt Readings from Last 3 Encounters:  10/07/20 232 lb 6.4 oz (105.4 kg)  10/03/20 238 lb (108 kg)  09/29/20 212 lb (96.2 kg)      Other studies Reviewed: Additional studies/ records that were reviewed today include:   Heart cath 06/22/20: Dist LM lesion is 15% stenosed. 1st Diag lesion is 70% stenosed. Mid LAD lesion is 50% stenosed. Ost LAD to Prox LAD lesion is 95% stenosed. Dist LAD lesion is 30% stenosed. 1st Mrg lesion is 30% stenosed. Dist Cx lesion is 60% stenosed with 60% stenosed side branch in LPAV. Ramus lesion is 60% stenosed. Post  intervention, there is a 0% residual stenosis. A drug-eluting stent was successfully placed using a STENT RESOLUTE ONYX 3.5X15.   Successful OCT guided orbital atherectomy and drug-eluting stent placement to the ostial LAD.  OCT images were overall suboptimal due to high flow state and difficulty clearing blood with contrast.   Recommendations: Dual antiplatelet therapy with aspirin and clopidogrel.  Aspirin can be discontinued after 1 week to minimize the risk of bleeding given that she is on anticoagulation. Anticoagulation can be resumed tomorrow if no bleeding issues from the right femoral  artery.     Echo limited 06/19/20:  1. The aortic valve is tricuspid and the leaflets are moderately  calcified. There is trivial aortic regurgitation. There is no apparent  vegetation on the current study. It appears to just be calcium on the  valve. Additionally, there is no significant  regurgitation, which goes against a destructive lesion such as  endocarditis. If there are clinical concerns for endocarditis, would  recommend a TEE. The aortic valve is tricuspid. There is moderate  calcification of the aortic valve. There is moderate  thickening of the aortic valve. Aortic valve regurgitation is trivial.  Mild to moderate aortic valve sclerosis/calcification is present, without  any evidence of aortic stenosis.   2. Left ventricular ejection fraction, by estimation, is 60 to 65%. The  left ventricle has normal function. The left ventricle has no regional  wall motion abnormalities.   3. A small pericardial effusion is present. The pericardial effusion is  circumferential.   4. The mitral valve is degenerative. Mild mitral valve regurgitation. No  evidence of mitral stenosis.   5. There is mildly elevated pulmonary artery systolic pressure.       ASSESSMENT AND PLAN:  1.  ***   Current medicines are reviewed at length with the patient today.  The patient {ACTIONS; HAS/DOES NOT  HAVE:19233} concerns regarding medicines.  The following changes have been made:  {PLAN; NO CHANGE:13088:s}  Labs/ tests ordered today include: *** No orders of the defined types were placed in this encounter.    Disposition:   FU with *** in {gen number [id number] {Days to years:10300}  Signed, Abigail Butts, PA-C  12/13/2020 11:00 PM

## 2020-12-14 ENCOUNTER — Ambulatory Visit: Payer: Medicare Other | Admitting: Medical

## 2020-12-14 DIAGNOSIS — I10 Essential (primary) hypertension: Secondary | ICD-10-CM

## 2020-12-14 DIAGNOSIS — I5032 Chronic diastolic (congestive) heart failure: Secondary | ICD-10-CM

## 2020-12-14 DIAGNOSIS — N186 End stage renal disease: Secondary | ICD-10-CM

## 2020-12-14 DIAGNOSIS — E785 Hyperlipidemia, unspecified: Secondary | ICD-10-CM

## 2020-12-14 DIAGNOSIS — I2510 Atherosclerotic heart disease of native coronary artery without angina pectoris: Secondary | ICD-10-CM

## 2020-12-14 DIAGNOSIS — I480 Paroxysmal atrial fibrillation: Secondary | ICD-10-CM

## 2020-12-15 DIAGNOSIS — D631 Anemia in chronic kidney disease: Secondary | ICD-10-CM | POA: Diagnosis not present

## 2020-12-15 DIAGNOSIS — I770 Arteriovenous fistula, acquired: Secondary | ICD-10-CM | POA: Diagnosis not present

## 2020-12-15 DIAGNOSIS — N2581 Secondary hyperparathyroidism of renal origin: Secondary | ICD-10-CM | POA: Diagnosis not present

## 2020-12-15 DIAGNOSIS — D509 Iron deficiency anemia, unspecified: Secondary | ICD-10-CM | POA: Diagnosis not present

## 2020-12-15 DIAGNOSIS — N186 End stage renal disease: Secondary | ICD-10-CM | POA: Diagnosis not present

## 2020-12-15 DIAGNOSIS — D689 Coagulation defect, unspecified: Secondary | ICD-10-CM | POA: Diagnosis not present

## 2020-12-15 DIAGNOSIS — Z992 Dependence on renal dialysis: Secondary | ICD-10-CM | POA: Diagnosis not present

## 2020-12-15 DIAGNOSIS — R52 Pain, unspecified: Secondary | ICD-10-CM | POA: Diagnosis not present

## 2020-12-17 DIAGNOSIS — D509 Iron deficiency anemia, unspecified: Secondary | ICD-10-CM | POA: Diagnosis not present

## 2020-12-17 DIAGNOSIS — I770 Arteriovenous fistula, acquired: Secondary | ICD-10-CM | POA: Diagnosis not present

## 2020-12-17 DIAGNOSIS — N186 End stage renal disease: Secondary | ICD-10-CM | POA: Diagnosis not present

## 2020-12-17 DIAGNOSIS — D631 Anemia in chronic kidney disease: Secondary | ICD-10-CM | POA: Diagnosis not present

## 2020-12-17 DIAGNOSIS — D689 Coagulation defect, unspecified: Secondary | ICD-10-CM | POA: Diagnosis not present

## 2020-12-17 DIAGNOSIS — Z992 Dependence on renal dialysis: Secondary | ICD-10-CM | POA: Diagnosis not present

## 2020-12-17 DIAGNOSIS — N2581 Secondary hyperparathyroidism of renal origin: Secondary | ICD-10-CM | POA: Diagnosis not present

## 2020-12-17 DIAGNOSIS — R52 Pain, unspecified: Secondary | ICD-10-CM | POA: Diagnosis not present

## 2020-12-22 DIAGNOSIS — N2581 Secondary hyperparathyroidism of renal origin: Secondary | ICD-10-CM | POA: Diagnosis not present

## 2020-12-22 DIAGNOSIS — Z992 Dependence on renal dialysis: Secondary | ICD-10-CM | POA: Diagnosis not present

## 2020-12-22 DIAGNOSIS — D631 Anemia in chronic kidney disease: Secondary | ICD-10-CM | POA: Diagnosis not present

## 2020-12-22 DIAGNOSIS — R52 Pain, unspecified: Secondary | ICD-10-CM | POA: Diagnosis not present

## 2020-12-22 DIAGNOSIS — D509 Iron deficiency anemia, unspecified: Secondary | ICD-10-CM | POA: Diagnosis not present

## 2020-12-22 DIAGNOSIS — I770 Arteriovenous fistula, acquired: Secondary | ICD-10-CM | POA: Diagnosis not present

## 2020-12-22 DIAGNOSIS — N186 End stage renal disease: Secondary | ICD-10-CM | POA: Diagnosis not present

## 2020-12-22 DIAGNOSIS — D689 Coagulation defect, unspecified: Secondary | ICD-10-CM | POA: Diagnosis not present

## 2020-12-24 DIAGNOSIS — Z992 Dependence on renal dialysis: Secondary | ICD-10-CM | POA: Diagnosis not present

## 2020-12-24 DIAGNOSIS — D509 Iron deficiency anemia, unspecified: Secondary | ICD-10-CM | POA: Diagnosis not present

## 2020-12-24 DIAGNOSIS — D689 Coagulation defect, unspecified: Secondary | ICD-10-CM | POA: Diagnosis not present

## 2020-12-24 DIAGNOSIS — R52 Pain, unspecified: Secondary | ICD-10-CM | POA: Diagnosis not present

## 2020-12-24 DIAGNOSIS — D631 Anemia in chronic kidney disease: Secondary | ICD-10-CM | POA: Diagnosis not present

## 2020-12-24 DIAGNOSIS — I770 Arteriovenous fistula, acquired: Secondary | ICD-10-CM | POA: Diagnosis not present

## 2020-12-24 DIAGNOSIS — N186 End stage renal disease: Secondary | ICD-10-CM | POA: Diagnosis not present

## 2020-12-24 DIAGNOSIS — N2581 Secondary hyperparathyroidism of renal origin: Secondary | ICD-10-CM | POA: Diagnosis not present

## 2020-12-27 DIAGNOSIS — I770 Arteriovenous fistula, acquired: Secondary | ICD-10-CM | POA: Diagnosis not present

## 2020-12-27 DIAGNOSIS — D509 Iron deficiency anemia, unspecified: Secondary | ICD-10-CM | POA: Diagnosis not present

## 2020-12-27 DIAGNOSIS — N2581 Secondary hyperparathyroidism of renal origin: Secondary | ICD-10-CM | POA: Diagnosis not present

## 2020-12-27 DIAGNOSIS — N186 End stage renal disease: Secondary | ICD-10-CM | POA: Diagnosis not present

## 2020-12-27 DIAGNOSIS — Z992 Dependence on renal dialysis: Secondary | ICD-10-CM | POA: Diagnosis not present

## 2020-12-27 DIAGNOSIS — D689 Coagulation defect, unspecified: Secondary | ICD-10-CM | POA: Diagnosis not present

## 2020-12-27 DIAGNOSIS — D631 Anemia in chronic kidney disease: Secondary | ICD-10-CM | POA: Diagnosis not present

## 2020-12-27 DIAGNOSIS — R52 Pain, unspecified: Secondary | ICD-10-CM | POA: Diagnosis not present

## 2020-12-29 DIAGNOSIS — D689 Coagulation defect, unspecified: Secondary | ICD-10-CM | POA: Diagnosis not present

## 2020-12-29 DIAGNOSIS — R52 Pain, unspecified: Secondary | ICD-10-CM | POA: Diagnosis not present

## 2020-12-29 DIAGNOSIS — N186 End stage renal disease: Secondary | ICD-10-CM | POA: Diagnosis not present

## 2020-12-29 DIAGNOSIS — D509 Iron deficiency anemia, unspecified: Secondary | ICD-10-CM | POA: Diagnosis not present

## 2020-12-29 DIAGNOSIS — N2581 Secondary hyperparathyroidism of renal origin: Secondary | ICD-10-CM | POA: Diagnosis not present

## 2020-12-29 DIAGNOSIS — I770 Arteriovenous fistula, acquired: Secondary | ICD-10-CM | POA: Diagnosis not present

## 2020-12-29 DIAGNOSIS — D631 Anemia in chronic kidney disease: Secondary | ICD-10-CM | POA: Diagnosis not present

## 2020-12-29 DIAGNOSIS — Z992 Dependence on renal dialysis: Secondary | ICD-10-CM | POA: Diagnosis not present

## 2020-12-31 DIAGNOSIS — N2581 Secondary hyperparathyroidism of renal origin: Secondary | ICD-10-CM | POA: Diagnosis not present

## 2020-12-31 DIAGNOSIS — I770 Arteriovenous fistula, acquired: Secondary | ICD-10-CM | POA: Diagnosis not present

## 2020-12-31 DIAGNOSIS — R52 Pain, unspecified: Secondary | ICD-10-CM | POA: Diagnosis not present

## 2020-12-31 DIAGNOSIS — D631 Anemia in chronic kidney disease: Secondary | ICD-10-CM | POA: Diagnosis not present

## 2020-12-31 DIAGNOSIS — D509 Iron deficiency anemia, unspecified: Secondary | ICD-10-CM | POA: Diagnosis not present

## 2020-12-31 DIAGNOSIS — Z992 Dependence on renal dialysis: Secondary | ICD-10-CM | POA: Diagnosis not present

## 2020-12-31 DIAGNOSIS — N186 End stage renal disease: Secondary | ICD-10-CM | POA: Diagnosis not present

## 2020-12-31 DIAGNOSIS — D689 Coagulation defect, unspecified: Secondary | ICD-10-CM | POA: Diagnosis not present

## 2021-01-03 DIAGNOSIS — D509 Iron deficiency anemia, unspecified: Secondary | ICD-10-CM | POA: Diagnosis not present

## 2021-01-03 DIAGNOSIS — D631 Anemia in chronic kidney disease: Secondary | ICD-10-CM | POA: Diagnosis not present

## 2021-01-03 DIAGNOSIS — N186 End stage renal disease: Secondary | ICD-10-CM | POA: Diagnosis not present

## 2021-01-03 DIAGNOSIS — D689 Coagulation defect, unspecified: Secondary | ICD-10-CM | POA: Diagnosis not present

## 2021-01-03 DIAGNOSIS — I770 Arteriovenous fistula, acquired: Secondary | ICD-10-CM | POA: Diagnosis not present

## 2021-01-03 DIAGNOSIS — Z992 Dependence on renal dialysis: Secondary | ICD-10-CM | POA: Diagnosis not present

## 2021-01-03 DIAGNOSIS — N2581 Secondary hyperparathyroidism of renal origin: Secondary | ICD-10-CM | POA: Diagnosis not present

## 2021-01-03 DIAGNOSIS — R52 Pain, unspecified: Secondary | ICD-10-CM | POA: Diagnosis not present

## 2021-01-05 ENCOUNTER — Encounter (HOSPITAL_COMMUNITY): Payer: Self-pay

## 2021-01-05 DIAGNOSIS — D689 Coagulation defect, unspecified: Secondary | ICD-10-CM | POA: Diagnosis not present

## 2021-01-05 DIAGNOSIS — R52 Pain, unspecified: Secondary | ICD-10-CM | POA: Diagnosis not present

## 2021-01-05 DIAGNOSIS — D631 Anemia in chronic kidney disease: Secondary | ICD-10-CM | POA: Diagnosis not present

## 2021-01-05 DIAGNOSIS — D509 Iron deficiency anemia, unspecified: Secondary | ICD-10-CM | POA: Diagnosis not present

## 2021-01-05 DIAGNOSIS — I770 Arteriovenous fistula, acquired: Secondary | ICD-10-CM | POA: Diagnosis not present

## 2021-01-05 DIAGNOSIS — Z992 Dependence on renal dialysis: Secondary | ICD-10-CM | POA: Diagnosis not present

## 2021-01-05 DIAGNOSIS — N2581 Secondary hyperparathyroidism of renal origin: Secondary | ICD-10-CM | POA: Diagnosis not present

## 2021-01-05 DIAGNOSIS — N186 End stage renal disease: Secondary | ICD-10-CM | POA: Diagnosis not present

## 2021-01-06 DIAGNOSIS — I129 Hypertensive chronic kidney disease with stage 1 through stage 4 chronic kidney disease, or unspecified chronic kidney disease: Secondary | ICD-10-CM | POA: Diagnosis not present

## 2021-01-06 DIAGNOSIS — N186 End stage renal disease: Secondary | ICD-10-CM | POA: Diagnosis not present

## 2021-01-06 DIAGNOSIS — Z992 Dependence on renal dialysis: Secondary | ICD-10-CM | POA: Diagnosis not present

## 2021-01-07 DIAGNOSIS — D509 Iron deficiency anemia, unspecified: Secondary | ICD-10-CM | POA: Diagnosis not present

## 2021-01-07 DIAGNOSIS — N2581 Secondary hyperparathyroidism of renal origin: Secondary | ICD-10-CM | POA: Diagnosis not present

## 2021-01-07 DIAGNOSIS — Z23 Encounter for immunization: Secondary | ICD-10-CM | POA: Diagnosis not present

## 2021-01-07 DIAGNOSIS — D689 Coagulation defect, unspecified: Secondary | ICD-10-CM | POA: Diagnosis not present

## 2021-01-07 DIAGNOSIS — Z992 Dependence on renal dialysis: Secondary | ICD-10-CM | POA: Diagnosis not present

## 2021-01-07 DIAGNOSIS — D631 Anemia in chronic kidney disease: Secondary | ICD-10-CM | POA: Diagnosis not present

## 2021-01-07 DIAGNOSIS — L299 Pruritus, unspecified: Secondary | ICD-10-CM | POA: Diagnosis not present

## 2021-01-07 DIAGNOSIS — I770 Arteriovenous fistula, acquired: Secondary | ICD-10-CM | POA: Diagnosis not present

## 2021-01-07 DIAGNOSIS — N186 End stage renal disease: Secondary | ICD-10-CM | POA: Diagnosis not present

## 2021-01-10 DIAGNOSIS — D509 Iron deficiency anemia, unspecified: Secondary | ICD-10-CM | POA: Diagnosis not present

## 2021-01-10 DIAGNOSIS — N2581 Secondary hyperparathyroidism of renal origin: Secondary | ICD-10-CM | POA: Diagnosis not present

## 2021-01-10 DIAGNOSIS — D631 Anemia in chronic kidney disease: Secondary | ICD-10-CM | POA: Diagnosis not present

## 2021-01-10 DIAGNOSIS — Z23 Encounter for immunization: Secondary | ICD-10-CM | POA: Diagnosis not present

## 2021-01-10 DIAGNOSIS — D689 Coagulation defect, unspecified: Secondary | ICD-10-CM | POA: Diagnosis not present

## 2021-01-10 DIAGNOSIS — Z992 Dependence on renal dialysis: Secondary | ICD-10-CM | POA: Diagnosis not present

## 2021-01-10 DIAGNOSIS — I770 Arteriovenous fistula, acquired: Secondary | ICD-10-CM | POA: Diagnosis not present

## 2021-01-10 DIAGNOSIS — L299 Pruritus, unspecified: Secondary | ICD-10-CM | POA: Diagnosis not present

## 2021-01-10 DIAGNOSIS — N186 End stage renal disease: Secondary | ICD-10-CM | POA: Diagnosis not present

## 2021-01-12 ENCOUNTER — Other Ambulatory Visit (HOSPITAL_COMMUNITY): Payer: Self-pay | Admitting: Physician Assistant

## 2021-01-12 DIAGNOSIS — N2581 Secondary hyperparathyroidism of renal origin: Secondary | ICD-10-CM | POA: Diagnosis not present

## 2021-01-12 DIAGNOSIS — N186 End stage renal disease: Secondary | ICD-10-CM | POA: Diagnosis not present

## 2021-01-12 DIAGNOSIS — Z992 Dependence on renal dialysis: Secondary | ICD-10-CM | POA: Diagnosis not present

## 2021-01-12 DIAGNOSIS — L299 Pruritus, unspecified: Secondary | ICD-10-CM | POA: Diagnosis not present

## 2021-01-12 DIAGNOSIS — D689 Coagulation defect, unspecified: Secondary | ICD-10-CM | POA: Diagnosis not present

## 2021-01-12 DIAGNOSIS — D631 Anemia in chronic kidney disease: Secondary | ICD-10-CM | POA: Diagnosis not present

## 2021-01-12 DIAGNOSIS — I770 Arteriovenous fistula, acquired: Secondary | ICD-10-CM | POA: Diagnosis not present

## 2021-01-12 DIAGNOSIS — D509 Iron deficiency anemia, unspecified: Secondary | ICD-10-CM | POA: Diagnosis not present

## 2021-01-12 DIAGNOSIS — Z23 Encounter for immunization: Secondary | ICD-10-CM | POA: Diagnosis not present

## 2021-01-14 DIAGNOSIS — I770 Arteriovenous fistula, acquired: Secondary | ICD-10-CM | POA: Diagnosis not present

## 2021-01-14 DIAGNOSIS — L299 Pruritus, unspecified: Secondary | ICD-10-CM | POA: Diagnosis not present

## 2021-01-14 DIAGNOSIS — N2581 Secondary hyperparathyroidism of renal origin: Secondary | ICD-10-CM | POA: Diagnosis not present

## 2021-01-14 DIAGNOSIS — N186 End stage renal disease: Secondary | ICD-10-CM | POA: Diagnosis not present

## 2021-01-14 DIAGNOSIS — Z992 Dependence on renal dialysis: Secondary | ICD-10-CM | POA: Diagnosis not present

## 2021-01-14 DIAGNOSIS — Z23 Encounter for immunization: Secondary | ICD-10-CM | POA: Diagnosis not present

## 2021-01-14 DIAGNOSIS — D509 Iron deficiency anemia, unspecified: Secondary | ICD-10-CM | POA: Diagnosis not present

## 2021-01-14 DIAGNOSIS — D631 Anemia in chronic kidney disease: Secondary | ICD-10-CM | POA: Diagnosis not present

## 2021-01-14 DIAGNOSIS — D689 Coagulation defect, unspecified: Secondary | ICD-10-CM | POA: Diagnosis not present

## 2021-01-17 DIAGNOSIS — D631 Anemia in chronic kidney disease: Secondary | ICD-10-CM | POA: Diagnosis not present

## 2021-01-17 DIAGNOSIS — Z992 Dependence on renal dialysis: Secondary | ICD-10-CM | POA: Diagnosis not present

## 2021-01-17 DIAGNOSIS — N186 End stage renal disease: Secondary | ICD-10-CM | POA: Diagnosis not present

## 2021-01-17 DIAGNOSIS — N2581 Secondary hyperparathyroidism of renal origin: Secondary | ICD-10-CM | POA: Diagnosis not present

## 2021-01-17 DIAGNOSIS — D509 Iron deficiency anemia, unspecified: Secondary | ICD-10-CM | POA: Diagnosis not present

## 2021-01-17 DIAGNOSIS — L299 Pruritus, unspecified: Secondary | ICD-10-CM | POA: Diagnosis not present

## 2021-01-17 DIAGNOSIS — D689 Coagulation defect, unspecified: Secondary | ICD-10-CM | POA: Diagnosis not present

## 2021-01-17 DIAGNOSIS — Z23 Encounter for immunization: Secondary | ICD-10-CM | POA: Diagnosis not present

## 2021-01-17 DIAGNOSIS — I770 Arteriovenous fistula, acquired: Secondary | ICD-10-CM | POA: Diagnosis not present

## 2021-01-21 DIAGNOSIS — Z23 Encounter for immunization: Secondary | ICD-10-CM | POA: Diagnosis not present

## 2021-01-21 DIAGNOSIS — L299 Pruritus, unspecified: Secondary | ICD-10-CM | POA: Diagnosis not present

## 2021-01-21 DIAGNOSIS — N2581 Secondary hyperparathyroidism of renal origin: Secondary | ICD-10-CM | POA: Diagnosis not present

## 2021-01-21 DIAGNOSIS — D689 Coagulation defect, unspecified: Secondary | ICD-10-CM | POA: Diagnosis not present

## 2021-01-21 DIAGNOSIS — N186 End stage renal disease: Secondary | ICD-10-CM | POA: Diagnosis not present

## 2021-01-21 DIAGNOSIS — D631 Anemia in chronic kidney disease: Secondary | ICD-10-CM | POA: Diagnosis not present

## 2021-01-21 DIAGNOSIS — D509 Iron deficiency anemia, unspecified: Secondary | ICD-10-CM | POA: Diagnosis not present

## 2021-01-21 DIAGNOSIS — I770 Arteriovenous fistula, acquired: Secondary | ICD-10-CM | POA: Diagnosis not present

## 2021-01-21 DIAGNOSIS — Z992 Dependence on renal dialysis: Secondary | ICD-10-CM | POA: Diagnosis not present

## 2021-01-24 DIAGNOSIS — D689 Coagulation defect, unspecified: Secondary | ICD-10-CM | POA: Diagnosis not present

## 2021-01-24 DIAGNOSIS — Z23 Encounter for immunization: Secondary | ICD-10-CM | POA: Diagnosis not present

## 2021-01-24 DIAGNOSIS — N2581 Secondary hyperparathyroidism of renal origin: Secondary | ICD-10-CM | POA: Diagnosis not present

## 2021-01-24 DIAGNOSIS — D509 Iron deficiency anemia, unspecified: Secondary | ICD-10-CM | POA: Diagnosis not present

## 2021-01-24 DIAGNOSIS — Z992 Dependence on renal dialysis: Secondary | ICD-10-CM | POA: Diagnosis not present

## 2021-01-24 DIAGNOSIS — L299 Pruritus, unspecified: Secondary | ICD-10-CM | POA: Diagnosis not present

## 2021-01-24 DIAGNOSIS — D631 Anemia in chronic kidney disease: Secondary | ICD-10-CM | POA: Diagnosis not present

## 2021-01-24 DIAGNOSIS — N186 End stage renal disease: Secondary | ICD-10-CM | POA: Diagnosis not present

## 2021-01-24 DIAGNOSIS — I770 Arteriovenous fistula, acquired: Secondary | ICD-10-CM | POA: Diagnosis not present

## 2021-01-26 DIAGNOSIS — D631 Anemia in chronic kidney disease: Secondary | ICD-10-CM | POA: Diagnosis not present

## 2021-01-26 DIAGNOSIS — L299 Pruritus, unspecified: Secondary | ICD-10-CM | POA: Diagnosis not present

## 2021-01-26 DIAGNOSIS — Z992 Dependence on renal dialysis: Secondary | ICD-10-CM | POA: Diagnosis not present

## 2021-01-26 DIAGNOSIS — N186 End stage renal disease: Secondary | ICD-10-CM | POA: Diagnosis not present

## 2021-01-26 DIAGNOSIS — D509 Iron deficiency anemia, unspecified: Secondary | ICD-10-CM | POA: Diagnosis not present

## 2021-01-26 DIAGNOSIS — N2581 Secondary hyperparathyroidism of renal origin: Secondary | ICD-10-CM | POA: Diagnosis not present

## 2021-01-26 DIAGNOSIS — Z23 Encounter for immunization: Secondary | ICD-10-CM | POA: Diagnosis not present

## 2021-01-26 DIAGNOSIS — D689 Coagulation defect, unspecified: Secondary | ICD-10-CM | POA: Diagnosis not present

## 2021-01-26 DIAGNOSIS — I770 Arteriovenous fistula, acquired: Secondary | ICD-10-CM | POA: Diagnosis not present

## 2021-01-28 DIAGNOSIS — Z992 Dependence on renal dialysis: Secondary | ICD-10-CM | POA: Diagnosis not present

## 2021-01-28 DIAGNOSIS — I770 Arteriovenous fistula, acquired: Secondary | ICD-10-CM | POA: Diagnosis not present

## 2021-01-28 DIAGNOSIS — N186 End stage renal disease: Secondary | ICD-10-CM | POA: Diagnosis not present

## 2021-01-28 DIAGNOSIS — L299 Pruritus, unspecified: Secondary | ICD-10-CM | POA: Diagnosis not present

## 2021-01-28 DIAGNOSIS — D509 Iron deficiency anemia, unspecified: Secondary | ICD-10-CM | POA: Diagnosis not present

## 2021-01-28 DIAGNOSIS — D631 Anemia in chronic kidney disease: Secondary | ICD-10-CM | POA: Diagnosis not present

## 2021-01-28 DIAGNOSIS — D689 Coagulation defect, unspecified: Secondary | ICD-10-CM | POA: Diagnosis not present

## 2021-01-28 DIAGNOSIS — N2581 Secondary hyperparathyroidism of renal origin: Secondary | ICD-10-CM | POA: Diagnosis not present

## 2021-01-28 DIAGNOSIS — Z23 Encounter for immunization: Secondary | ICD-10-CM | POA: Diagnosis not present

## 2021-01-31 DIAGNOSIS — L299 Pruritus, unspecified: Secondary | ICD-10-CM | POA: Diagnosis not present

## 2021-01-31 DIAGNOSIS — Z23 Encounter for immunization: Secondary | ICD-10-CM | POA: Diagnosis not present

## 2021-01-31 DIAGNOSIS — D631 Anemia in chronic kidney disease: Secondary | ICD-10-CM | POA: Diagnosis not present

## 2021-01-31 DIAGNOSIS — D509 Iron deficiency anemia, unspecified: Secondary | ICD-10-CM | POA: Diagnosis not present

## 2021-01-31 DIAGNOSIS — N2581 Secondary hyperparathyroidism of renal origin: Secondary | ICD-10-CM | POA: Diagnosis not present

## 2021-01-31 DIAGNOSIS — D689 Coagulation defect, unspecified: Secondary | ICD-10-CM | POA: Diagnosis not present

## 2021-01-31 DIAGNOSIS — I770 Arteriovenous fistula, acquired: Secondary | ICD-10-CM | POA: Diagnosis not present

## 2021-01-31 DIAGNOSIS — Z992 Dependence on renal dialysis: Secondary | ICD-10-CM | POA: Diagnosis not present

## 2021-01-31 DIAGNOSIS — N186 End stage renal disease: Secondary | ICD-10-CM | POA: Diagnosis not present

## 2021-02-02 DIAGNOSIS — D509 Iron deficiency anemia, unspecified: Secondary | ICD-10-CM | POA: Diagnosis not present

## 2021-02-02 DIAGNOSIS — D689 Coagulation defect, unspecified: Secondary | ICD-10-CM | POA: Diagnosis not present

## 2021-02-02 DIAGNOSIS — N186 End stage renal disease: Secondary | ICD-10-CM | POA: Diagnosis not present

## 2021-02-02 DIAGNOSIS — Z992 Dependence on renal dialysis: Secondary | ICD-10-CM | POA: Diagnosis not present

## 2021-02-02 DIAGNOSIS — N2581 Secondary hyperparathyroidism of renal origin: Secondary | ICD-10-CM | POA: Diagnosis not present

## 2021-02-02 DIAGNOSIS — I770 Arteriovenous fistula, acquired: Secondary | ICD-10-CM | POA: Diagnosis not present

## 2021-02-02 DIAGNOSIS — L299 Pruritus, unspecified: Secondary | ICD-10-CM | POA: Diagnosis not present

## 2021-02-02 DIAGNOSIS — D631 Anemia in chronic kidney disease: Secondary | ICD-10-CM | POA: Diagnosis not present

## 2021-02-02 DIAGNOSIS — Z23 Encounter for immunization: Secondary | ICD-10-CM | POA: Diagnosis not present

## 2021-02-04 DIAGNOSIS — D689 Coagulation defect, unspecified: Secondary | ICD-10-CM | POA: Diagnosis not present

## 2021-02-04 DIAGNOSIS — D631 Anemia in chronic kidney disease: Secondary | ICD-10-CM | POA: Diagnosis not present

## 2021-02-04 DIAGNOSIS — D509 Iron deficiency anemia, unspecified: Secondary | ICD-10-CM | POA: Diagnosis not present

## 2021-02-04 DIAGNOSIS — N2581 Secondary hyperparathyroidism of renal origin: Secondary | ICD-10-CM | POA: Diagnosis not present

## 2021-02-04 DIAGNOSIS — N186 End stage renal disease: Secondary | ICD-10-CM | POA: Diagnosis not present

## 2021-02-04 DIAGNOSIS — Z992 Dependence on renal dialysis: Secondary | ICD-10-CM | POA: Diagnosis not present

## 2021-02-04 DIAGNOSIS — I770 Arteriovenous fistula, acquired: Secondary | ICD-10-CM | POA: Diagnosis not present

## 2021-02-04 DIAGNOSIS — Z23 Encounter for immunization: Secondary | ICD-10-CM | POA: Diagnosis not present

## 2021-02-04 DIAGNOSIS — L299 Pruritus, unspecified: Secondary | ICD-10-CM | POA: Diagnosis not present

## 2021-02-06 DIAGNOSIS — Z992 Dependence on renal dialysis: Secondary | ICD-10-CM | POA: Diagnosis not present

## 2021-02-06 DIAGNOSIS — N186 End stage renal disease: Secondary | ICD-10-CM | POA: Diagnosis not present

## 2021-02-06 DIAGNOSIS — I129 Hypertensive chronic kidney disease with stage 1 through stage 4 chronic kidney disease, or unspecified chronic kidney disease: Secondary | ICD-10-CM | POA: Diagnosis not present

## 2021-02-07 DIAGNOSIS — N2581 Secondary hyperparathyroidism of renal origin: Secondary | ICD-10-CM | POA: Diagnosis not present

## 2021-02-07 DIAGNOSIS — D509 Iron deficiency anemia, unspecified: Secondary | ICD-10-CM | POA: Diagnosis not present

## 2021-02-07 DIAGNOSIS — I770 Arteriovenous fistula, acquired: Secondary | ICD-10-CM | POA: Diagnosis not present

## 2021-02-07 DIAGNOSIS — D689 Coagulation defect, unspecified: Secondary | ICD-10-CM | POA: Diagnosis not present

## 2021-02-07 DIAGNOSIS — N186 End stage renal disease: Secondary | ICD-10-CM | POA: Diagnosis not present

## 2021-02-07 DIAGNOSIS — D631 Anemia in chronic kidney disease: Secondary | ICD-10-CM | POA: Diagnosis not present

## 2021-02-07 DIAGNOSIS — R52 Pain, unspecified: Secondary | ICD-10-CM | POA: Diagnosis not present

## 2021-02-07 DIAGNOSIS — Z992 Dependence on renal dialysis: Secondary | ICD-10-CM | POA: Diagnosis not present

## 2021-02-09 DIAGNOSIS — D509 Iron deficiency anemia, unspecified: Secondary | ICD-10-CM | POA: Diagnosis not present

## 2021-02-09 DIAGNOSIS — Z992 Dependence on renal dialysis: Secondary | ICD-10-CM | POA: Diagnosis not present

## 2021-02-09 DIAGNOSIS — D631 Anemia in chronic kidney disease: Secondary | ICD-10-CM | POA: Diagnosis not present

## 2021-02-09 DIAGNOSIS — R52 Pain, unspecified: Secondary | ICD-10-CM | POA: Diagnosis not present

## 2021-02-09 DIAGNOSIS — D689 Coagulation defect, unspecified: Secondary | ICD-10-CM | POA: Diagnosis not present

## 2021-02-09 DIAGNOSIS — N186 End stage renal disease: Secondary | ICD-10-CM | POA: Diagnosis not present

## 2021-02-09 DIAGNOSIS — I770 Arteriovenous fistula, acquired: Secondary | ICD-10-CM | POA: Diagnosis not present

## 2021-02-09 DIAGNOSIS — N2581 Secondary hyperparathyroidism of renal origin: Secondary | ICD-10-CM | POA: Diagnosis not present

## 2021-02-10 ENCOUNTER — Other Ambulatory Visit (HOSPITAL_COMMUNITY): Payer: Self-pay | Admitting: Physician Assistant

## 2021-02-11 DIAGNOSIS — Z992 Dependence on renal dialysis: Secondary | ICD-10-CM | POA: Diagnosis not present

## 2021-02-11 DIAGNOSIS — N186 End stage renal disease: Secondary | ICD-10-CM | POA: Diagnosis not present

## 2021-02-11 DIAGNOSIS — D689 Coagulation defect, unspecified: Secondary | ICD-10-CM | POA: Diagnosis not present

## 2021-02-11 DIAGNOSIS — R52 Pain, unspecified: Secondary | ICD-10-CM | POA: Diagnosis not present

## 2021-02-11 DIAGNOSIS — D509 Iron deficiency anemia, unspecified: Secondary | ICD-10-CM | POA: Diagnosis not present

## 2021-02-11 DIAGNOSIS — D631 Anemia in chronic kidney disease: Secondary | ICD-10-CM | POA: Diagnosis not present

## 2021-02-11 DIAGNOSIS — N2581 Secondary hyperparathyroidism of renal origin: Secondary | ICD-10-CM | POA: Diagnosis not present

## 2021-02-11 DIAGNOSIS — I770 Arteriovenous fistula, acquired: Secondary | ICD-10-CM | POA: Diagnosis not present

## 2021-02-14 DIAGNOSIS — D689 Coagulation defect, unspecified: Secondary | ICD-10-CM | POA: Diagnosis not present

## 2021-02-14 DIAGNOSIS — R52 Pain, unspecified: Secondary | ICD-10-CM | POA: Diagnosis not present

## 2021-02-14 DIAGNOSIS — N2581 Secondary hyperparathyroidism of renal origin: Secondary | ICD-10-CM | POA: Diagnosis not present

## 2021-02-14 DIAGNOSIS — D631 Anemia in chronic kidney disease: Secondary | ICD-10-CM | POA: Diagnosis not present

## 2021-02-14 DIAGNOSIS — D509 Iron deficiency anemia, unspecified: Secondary | ICD-10-CM | POA: Diagnosis not present

## 2021-02-14 DIAGNOSIS — N186 End stage renal disease: Secondary | ICD-10-CM | POA: Diagnosis not present

## 2021-02-14 DIAGNOSIS — I770 Arteriovenous fistula, acquired: Secondary | ICD-10-CM | POA: Diagnosis not present

## 2021-02-14 DIAGNOSIS — Z992 Dependence on renal dialysis: Secondary | ICD-10-CM | POA: Diagnosis not present

## 2021-02-18 DIAGNOSIS — Z992 Dependence on renal dialysis: Secondary | ICD-10-CM | POA: Diagnosis not present

## 2021-02-18 DIAGNOSIS — D631 Anemia in chronic kidney disease: Secondary | ICD-10-CM | POA: Diagnosis not present

## 2021-02-18 DIAGNOSIS — D689 Coagulation defect, unspecified: Secondary | ICD-10-CM | POA: Diagnosis not present

## 2021-02-18 DIAGNOSIS — R52 Pain, unspecified: Secondary | ICD-10-CM | POA: Diagnosis not present

## 2021-02-18 DIAGNOSIS — D509 Iron deficiency anemia, unspecified: Secondary | ICD-10-CM | POA: Diagnosis not present

## 2021-02-18 DIAGNOSIS — N186 End stage renal disease: Secondary | ICD-10-CM | POA: Diagnosis not present

## 2021-02-18 DIAGNOSIS — I770 Arteriovenous fistula, acquired: Secondary | ICD-10-CM | POA: Diagnosis not present

## 2021-02-18 DIAGNOSIS — N2581 Secondary hyperparathyroidism of renal origin: Secondary | ICD-10-CM | POA: Diagnosis not present

## 2021-02-21 DIAGNOSIS — N186 End stage renal disease: Secondary | ICD-10-CM | POA: Diagnosis not present

## 2021-02-21 DIAGNOSIS — D631 Anemia in chronic kidney disease: Secondary | ICD-10-CM | POA: Diagnosis not present

## 2021-02-21 DIAGNOSIS — Z992 Dependence on renal dialysis: Secondary | ICD-10-CM | POA: Diagnosis not present

## 2021-02-21 DIAGNOSIS — I770 Arteriovenous fistula, acquired: Secondary | ICD-10-CM | POA: Diagnosis not present

## 2021-02-21 DIAGNOSIS — N2581 Secondary hyperparathyroidism of renal origin: Secondary | ICD-10-CM | POA: Diagnosis not present

## 2021-02-21 DIAGNOSIS — D689 Coagulation defect, unspecified: Secondary | ICD-10-CM | POA: Diagnosis not present

## 2021-02-21 DIAGNOSIS — D509 Iron deficiency anemia, unspecified: Secondary | ICD-10-CM | POA: Diagnosis not present

## 2021-02-21 DIAGNOSIS — R52 Pain, unspecified: Secondary | ICD-10-CM | POA: Diagnosis not present

## 2021-02-23 DIAGNOSIS — D509 Iron deficiency anemia, unspecified: Secondary | ICD-10-CM | POA: Diagnosis not present

## 2021-02-23 DIAGNOSIS — N2581 Secondary hyperparathyroidism of renal origin: Secondary | ICD-10-CM | POA: Diagnosis not present

## 2021-02-23 DIAGNOSIS — D689 Coagulation defect, unspecified: Secondary | ICD-10-CM | POA: Diagnosis not present

## 2021-02-23 DIAGNOSIS — Z992 Dependence on renal dialysis: Secondary | ICD-10-CM | POA: Diagnosis not present

## 2021-02-23 DIAGNOSIS — D631 Anemia in chronic kidney disease: Secondary | ICD-10-CM | POA: Diagnosis not present

## 2021-02-23 DIAGNOSIS — R52 Pain, unspecified: Secondary | ICD-10-CM | POA: Diagnosis not present

## 2021-02-23 DIAGNOSIS — I770 Arteriovenous fistula, acquired: Secondary | ICD-10-CM | POA: Diagnosis not present

## 2021-02-23 DIAGNOSIS — N186 End stage renal disease: Secondary | ICD-10-CM | POA: Diagnosis not present

## 2021-02-25 DIAGNOSIS — Z992 Dependence on renal dialysis: Secondary | ICD-10-CM | POA: Diagnosis not present

## 2021-02-25 DIAGNOSIS — I770 Arteriovenous fistula, acquired: Secondary | ICD-10-CM | POA: Diagnosis not present

## 2021-02-25 DIAGNOSIS — N186 End stage renal disease: Secondary | ICD-10-CM | POA: Diagnosis not present

## 2021-02-25 DIAGNOSIS — R52 Pain, unspecified: Secondary | ICD-10-CM | POA: Diagnosis not present

## 2021-02-25 DIAGNOSIS — D689 Coagulation defect, unspecified: Secondary | ICD-10-CM | POA: Diagnosis not present

## 2021-02-25 DIAGNOSIS — N2581 Secondary hyperparathyroidism of renal origin: Secondary | ICD-10-CM | POA: Diagnosis not present

## 2021-02-25 DIAGNOSIS — D509 Iron deficiency anemia, unspecified: Secondary | ICD-10-CM | POA: Diagnosis not present

## 2021-02-25 DIAGNOSIS — D631 Anemia in chronic kidney disease: Secondary | ICD-10-CM | POA: Diagnosis not present

## 2021-02-28 DIAGNOSIS — D631 Anemia in chronic kidney disease: Secondary | ICD-10-CM | POA: Diagnosis not present

## 2021-02-28 DIAGNOSIS — D689 Coagulation defect, unspecified: Secondary | ICD-10-CM | POA: Diagnosis not present

## 2021-02-28 DIAGNOSIS — N2581 Secondary hyperparathyroidism of renal origin: Secondary | ICD-10-CM | POA: Diagnosis not present

## 2021-02-28 DIAGNOSIS — D509 Iron deficiency anemia, unspecified: Secondary | ICD-10-CM | POA: Diagnosis not present

## 2021-02-28 DIAGNOSIS — N186 End stage renal disease: Secondary | ICD-10-CM | POA: Diagnosis not present

## 2021-02-28 DIAGNOSIS — Z992 Dependence on renal dialysis: Secondary | ICD-10-CM | POA: Diagnosis not present

## 2021-02-28 DIAGNOSIS — I770 Arteriovenous fistula, acquired: Secondary | ICD-10-CM | POA: Diagnosis not present

## 2021-02-28 DIAGNOSIS — R52 Pain, unspecified: Secondary | ICD-10-CM | POA: Diagnosis not present

## 2021-03-03 DIAGNOSIS — D509 Iron deficiency anemia, unspecified: Secondary | ICD-10-CM | POA: Diagnosis not present

## 2021-03-03 DIAGNOSIS — D631 Anemia in chronic kidney disease: Secondary | ICD-10-CM | POA: Diagnosis not present

## 2021-03-03 DIAGNOSIS — Z992 Dependence on renal dialysis: Secondary | ICD-10-CM | POA: Diagnosis not present

## 2021-03-03 DIAGNOSIS — D689 Coagulation defect, unspecified: Secondary | ICD-10-CM | POA: Diagnosis not present

## 2021-03-03 DIAGNOSIS — N186 End stage renal disease: Secondary | ICD-10-CM | POA: Diagnosis not present

## 2021-03-03 DIAGNOSIS — R52 Pain, unspecified: Secondary | ICD-10-CM | POA: Diagnosis not present

## 2021-03-03 DIAGNOSIS — I770 Arteriovenous fistula, acquired: Secondary | ICD-10-CM | POA: Diagnosis not present

## 2021-03-03 DIAGNOSIS — N2581 Secondary hyperparathyroidism of renal origin: Secondary | ICD-10-CM | POA: Diagnosis not present

## 2021-03-07 DIAGNOSIS — N2581 Secondary hyperparathyroidism of renal origin: Secondary | ICD-10-CM | POA: Diagnosis not present

## 2021-03-07 DIAGNOSIS — I770 Arteriovenous fistula, acquired: Secondary | ICD-10-CM | POA: Diagnosis not present

## 2021-03-07 DIAGNOSIS — N186 End stage renal disease: Secondary | ICD-10-CM | POA: Diagnosis not present

## 2021-03-07 DIAGNOSIS — Z992 Dependence on renal dialysis: Secondary | ICD-10-CM | POA: Diagnosis not present

## 2021-03-07 DIAGNOSIS — D689 Coagulation defect, unspecified: Secondary | ICD-10-CM | POA: Diagnosis not present

## 2021-03-07 DIAGNOSIS — D509 Iron deficiency anemia, unspecified: Secondary | ICD-10-CM | POA: Diagnosis not present

## 2021-03-07 DIAGNOSIS — R52 Pain, unspecified: Secondary | ICD-10-CM | POA: Diagnosis not present

## 2021-03-07 DIAGNOSIS — D631 Anemia in chronic kidney disease: Secondary | ICD-10-CM | POA: Diagnosis not present

## 2021-03-08 DIAGNOSIS — I129 Hypertensive chronic kidney disease with stage 1 through stage 4 chronic kidney disease, or unspecified chronic kidney disease: Secondary | ICD-10-CM | POA: Diagnosis not present

## 2021-03-08 DIAGNOSIS — N186 End stage renal disease: Secondary | ICD-10-CM | POA: Diagnosis not present

## 2021-03-08 DIAGNOSIS — Z992 Dependence on renal dialysis: Secondary | ICD-10-CM | POA: Diagnosis not present

## 2021-03-09 DIAGNOSIS — Z992 Dependence on renal dialysis: Secondary | ICD-10-CM | POA: Diagnosis not present

## 2021-03-09 DIAGNOSIS — I770 Arteriovenous fistula, acquired: Secondary | ICD-10-CM | POA: Diagnosis not present

## 2021-03-09 DIAGNOSIS — I129 Hypertensive chronic kidney disease with stage 1 through stage 4 chronic kidney disease, or unspecified chronic kidney disease: Secondary | ICD-10-CM | POA: Diagnosis not present

## 2021-03-09 DIAGNOSIS — D631 Anemia in chronic kidney disease: Secondary | ICD-10-CM | POA: Diagnosis not present

## 2021-03-09 DIAGNOSIS — N186 End stage renal disease: Secondary | ICD-10-CM | POA: Diagnosis not present

## 2021-03-09 DIAGNOSIS — D509 Iron deficiency anemia, unspecified: Secondary | ICD-10-CM | POA: Diagnosis not present

## 2021-03-09 DIAGNOSIS — D689 Coagulation defect, unspecified: Secondary | ICD-10-CM | POA: Diagnosis not present

## 2021-03-09 DIAGNOSIS — N2581 Secondary hyperparathyroidism of renal origin: Secondary | ICD-10-CM | POA: Diagnosis not present

## 2021-03-10 ENCOUNTER — Telehealth (HOSPITAL_COMMUNITY): Payer: Self-pay | Admitting: *Deleted

## 2021-03-10 NOTE — Telephone Encounter (Signed)
Received call representative from [facility] regarding patient having angina. Pt of Dr. Gwenlyn Found recommended seeing their office since angina main complaint as our office only treats atrial fib. Office number provided.

## 2021-03-11 DIAGNOSIS — N186 End stage renal disease: Secondary | ICD-10-CM | POA: Diagnosis not present

## 2021-03-11 DIAGNOSIS — I770 Arteriovenous fistula, acquired: Secondary | ICD-10-CM | POA: Diagnosis not present

## 2021-03-11 DIAGNOSIS — N2581 Secondary hyperparathyroidism of renal origin: Secondary | ICD-10-CM | POA: Diagnosis not present

## 2021-03-11 DIAGNOSIS — Z992 Dependence on renal dialysis: Secondary | ICD-10-CM | POA: Diagnosis not present

## 2021-03-11 DIAGNOSIS — D631 Anemia in chronic kidney disease: Secondary | ICD-10-CM | POA: Diagnosis not present

## 2021-03-11 DIAGNOSIS — D689 Coagulation defect, unspecified: Secondary | ICD-10-CM | POA: Diagnosis not present

## 2021-03-11 DIAGNOSIS — D509 Iron deficiency anemia, unspecified: Secondary | ICD-10-CM | POA: Diagnosis not present

## 2021-03-14 DIAGNOSIS — N2581 Secondary hyperparathyroidism of renal origin: Secondary | ICD-10-CM | POA: Diagnosis not present

## 2021-03-14 DIAGNOSIS — Z992 Dependence on renal dialysis: Secondary | ICD-10-CM | POA: Diagnosis not present

## 2021-03-14 DIAGNOSIS — D631 Anemia in chronic kidney disease: Secondary | ICD-10-CM | POA: Diagnosis not present

## 2021-03-14 DIAGNOSIS — I770 Arteriovenous fistula, acquired: Secondary | ICD-10-CM | POA: Diagnosis not present

## 2021-03-14 DIAGNOSIS — D689 Coagulation defect, unspecified: Secondary | ICD-10-CM | POA: Diagnosis not present

## 2021-03-14 DIAGNOSIS — N186 End stage renal disease: Secondary | ICD-10-CM | POA: Diagnosis not present

## 2021-03-14 DIAGNOSIS — D509 Iron deficiency anemia, unspecified: Secondary | ICD-10-CM | POA: Diagnosis not present

## 2021-03-16 DIAGNOSIS — D689 Coagulation defect, unspecified: Secondary | ICD-10-CM | POA: Diagnosis not present

## 2021-03-16 DIAGNOSIS — I770 Arteriovenous fistula, acquired: Secondary | ICD-10-CM | POA: Diagnosis not present

## 2021-03-16 DIAGNOSIS — D509 Iron deficiency anemia, unspecified: Secondary | ICD-10-CM | POA: Diagnosis not present

## 2021-03-16 DIAGNOSIS — N2581 Secondary hyperparathyroidism of renal origin: Secondary | ICD-10-CM | POA: Diagnosis not present

## 2021-03-16 DIAGNOSIS — N186 End stage renal disease: Secondary | ICD-10-CM | POA: Diagnosis not present

## 2021-03-16 DIAGNOSIS — Z992 Dependence on renal dialysis: Secondary | ICD-10-CM | POA: Diagnosis not present

## 2021-03-16 DIAGNOSIS — D631 Anemia in chronic kidney disease: Secondary | ICD-10-CM | POA: Diagnosis not present

## 2021-03-18 DIAGNOSIS — D631 Anemia in chronic kidney disease: Secondary | ICD-10-CM | POA: Diagnosis not present

## 2021-03-18 DIAGNOSIS — I770 Arteriovenous fistula, acquired: Secondary | ICD-10-CM | POA: Diagnosis not present

## 2021-03-18 DIAGNOSIS — D509 Iron deficiency anemia, unspecified: Secondary | ICD-10-CM | POA: Diagnosis not present

## 2021-03-18 DIAGNOSIS — Z992 Dependence on renal dialysis: Secondary | ICD-10-CM | POA: Diagnosis not present

## 2021-03-18 DIAGNOSIS — N186 End stage renal disease: Secondary | ICD-10-CM | POA: Diagnosis not present

## 2021-03-18 DIAGNOSIS — N2581 Secondary hyperparathyroidism of renal origin: Secondary | ICD-10-CM | POA: Diagnosis not present

## 2021-03-18 DIAGNOSIS — D689 Coagulation defect, unspecified: Secondary | ICD-10-CM | POA: Diagnosis not present

## 2021-03-21 DIAGNOSIS — I770 Arteriovenous fistula, acquired: Secondary | ICD-10-CM | POA: Diagnosis not present

## 2021-03-21 DIAGNOSIS — Z992 Dependence on renal dialysis: Secondary | ICD-10-CM | POA: Diagnosis not present

## 2021-03-21 DIAGNOSIS — D509 Iron deficiency anemia, unspecified: Secondary | ICD-10-CM | POA: Diagnosis not present

## 2021-03-21 DIAGNOSIS — N2581 Secondary hyperparathyroidism of renal origin: Secondary | ICD-10-CM | POA: Diagnosis not present

## 2021-03-21 DIAGNOSIS — D689 Coagulation defect, unspecified: Secondary | ICD-10-CM | POA: Diagnosis not present

## 2021-03-21 DIAGNOSIS — D631 Anemia in chronic kidney disease: Secondary | ICD-10-CM | POA: Diagnosis not present

## 2021-03-21 DIAGNOSIS — N186 End stage renal disease: Secondary | ICD-10-CM | POA: Diagnosis not present

## 2021-03-21 NOTE — Progress Notes (Signed)
Cardiology Office Note:    Date:  03/22/2021   ID:  Erin Dean, DOB 09/01/1965, MRN [id number]  PCP:  Lois Huxley, PA   [facility] Providers Cardiologist:  Janina Mayo, MD     Referring MD: Oliver Barre, [place]   No chief complaint on file. Chest Pain  History of Present Illness:    Erin Dean is a 55 y.o. female with a hx of ostial/Prox LAD 95 % lesion s/p DES Resolute Onyx, ESRD on HD, malignant hypertension, obesity, chronic diastolic heart failure, CVA, and obesity  referral for atrial fibrillation CHADS2VASC 6 and ischemic heart dx FU  She was diagnosed 06/2020. Prior to this she was seen then for an NSTEMI s/p ostial/Prox s/p DES Resolute Onyx LAD DES managed with triple therapy for one month. She follows with afib clinic and Dr. Willis Modena. She was seen in ED 09/06/20 with afib with RVR and converted to SR with IV diltiazem. Returned to ED 09/13/20 and underwent DCCV and started on amiodarone. Admitted 09/20/20 for afib but converted after lorazepam was given in the ED. Her amiodarone was increased for quicker loading. She was last seen by them in July 2022  She comes in today to establish care. She reports chest pain with walking up her steps and dyspnea on exertion. She has to rest with walking up stairs. This has been going on for about one month. She is told that her HR is high at dialysis. She does not walk a significant distance. She feels out of breath and dizzy. She feels like she is going to pass out. Her heart was racing recently. She took SL nitro. She reports orthopnea, PND. No leg swelling. She goes to [facility], Saturday at [place]. They took off 1 kg yesterday. She's lost weight. No hx of smoking.   Wt Readings from Last 3 Encounters:  03/22/21 215 lb 12.8 oz (97.9 kg)  10/07/20 232 lb 6.4 oz (105.4 kg)  10/03/20 238 lb (108 kg)    A1c 5.3%  Cardiology studies TTE 06/19/2020 .   2. Left ventricular ejection fraction, by estimation, is 60 to  65%.The left ventricle has normal function. The left ventricle has no regional wall motion abnormalities.  -Trivial AI  3. A small pericardial effusion is present. The pericardial effusion is  circumferential.   4. The mitral valve is degenerative. Mild mitral valve regurgitation. No  evidence of mitral stenosis.   5. There is mildly elevated pulmonary artery systolic pressure.  LHC 06/20/2020 Conclusions: Severe single-vessel coronary artery disease with 95% ostial/proximal LAD stenosis with heavy calcification.  Mild to moderate CAD also noted in mid/distal LAD, D1, ramus, intermedius, large OM1, codominant LCx, and RCA. Normal left ventricular filling pressure.   Recommendations: Images reviewed with interventional team and Dr. Radford Pax.  Given multiple comorbitities, including recurrent CVA and ESRD, patient is not a good candidate for CABG.  Additionally, she has refused procedures necessitating general anesthesia.  We agree that the most favorable plan would be to proceed with atherectomy/PCI of the ostial/proximal LAD. Stop ticagrelor after tonight's dose and load with clopidogrel 300 mg x 1 tomorrow morning, followed by 75 mg daily thereafter.  Anticipate discharge on apixaban and clopidogrel. Restart IV heparin in the setting of paroxysmal atrial fibrillation and NSTEMI 8 hours after right femoral artery sheath removed. Remove right femoral artery sheath with manual compression. Aggressive secondary prevention.  06/22/2020- S/p OCT guided orbital atherectomy and drug-eluting stent placement to the ostial LAD.  EKG 10/07/2020- NSR, Qtc 509 ms   Past Medical History:  Diagnosis Date   ESRD (end stage renal disease) ([place])    TTHS [place]    Hypertension    Stroke ([place]) 04/2017   no residual . Limp left side   SVT (supraventricular tachycardia) ([place])     Past Surgical History:  Procedure Laterality Date   AV FISTULA PLACEMENT Right 08/16/2017   Procedure: CREATION OF RADIOCEPHALIC  VERSUS BRACHIOCEPHALIC ARTERIOVENOUS FISTULA RIGHT ARM;  Surgeon: Conrad Russell, MD;  Location: [facility] OR;  Service: Vascular;  Laterality: Right;   AV FISTULA PLACEMENT Right 02/07/2018   Procedure: Creation Right arm Brachiocephalic Fistula;  Surgeon: Marty Heck, MD;  Location: [place];  Service: Vascular;  Laterality: Right;   AV FISTULA PLACEMENT Left 08/15/2018   Procedure: ARTERIOVENOUS (AV) FISTULA CREATION;  Surgeon: Serafina Mitchell, MD;  Location: [place];  Service: Vascular;  Laterality: Left;   Reader Right 04/07/2018   Procedure: SECOND STAGE BASILIC VEIN TRANSPOSITION RIGHT ARM;  Surgeon: Marty Heck, MD;  Location: [place];  Service: Vascular;  Laterality: Right;   CESAREAN SECTION     CORONARY ATHERECTOMY N/A 06/22/2020   Procedure: CORONARY ATHERECTOMY;  Surgeon: Wellington Hampshire, MD;  Location: [place] CV LAB;  Service: Cardiovascular;  Laterality: N/A;   CORONARY STENT INTERVENTION N/A 06/22/2020   Procedure: CORONARY STENT INTERVENTION;  Surgeon: Wellington Hampshire, MD;  Location: [place] CV LAB;  Service: Cardiovascular;  Laterality: N/A;   ESOPHAGOGASTRODUODENOSCOPY N/A 04/19/2017   Procedure: ESOPHAGOGASTRODUODENOSCOPY (EGD);  Surgeon: Carol Ada, MD;  Location: [place];  Service: Endoscopy;  Laterality: N/A;   FISTULA SUPERFICIALIZATION Right 10/01/2017   Procedure: FISTULA SUPERFICIALIZATION RIGHT ARM;  Surgeon: Conrad Addieville, MD;  Location: [place];  Service: Vascular;  Laterality: Right;   FISTULA SUPERFICIALIZATION Left 10/08/2018   Procedure: FISTULA SUPERFICIALIZATION LEFT ARM;  Surgeon: Serafina Mitchell, MD;  Location: [place];  Service: Vascular;  Laterality: Left;   INTRAVASCULAR IMAGING/OCT N/A 06/22/2020   Procedure: INTRAVASCULAR IMAGING/OCT;  Surgeon: Wellington Hampshire, MD;  Location: [place] [facility];  Service: Cardiovascular;  Laterality: N/A;   IR FLUORO GUIDE CV LINE RIGHT  04/23/2018   IR US GUIDE VASC ACCESS RIGHT   04/23/2018   LEFT HEART CATH AND CORONARY ANGIOGRAPHY N/A 06/20/2020   Procedure: LEFT HEART CATH AND CORONARY ANGIOGRAPHY;  Surgeon: Nelva Bush, MD;  Location: [place] [facility];  Service: Cardiovascular;  Laterality: N/A;   LOOP RECORDER INSERTION N/A 04/22/2017   Procedure: LOOP RECORDER INSERTION;  Surgeon: Constance Haw, MD;  Location: [place] [facility];  Service: Cardiovascular;  Laterality: N/A;   RADIOLOGY WITH ANESTHESIA N/A 04/01/2020   Procedure: MRI W/O CONSTRAST  WITH ANESTHESIA;  Surgeon: Radiologist, Medication, MD;  Location: [place];  Service: Radiology;  Laterality: N/A;   TEE WITHOUT CARDIOVERSION N/A 04/19/2017   Procedure: TRANSESOPHAGEAL ECHOCARDIOGRAM (TEE);  Surgeon: Carol Ada, MD;  Location: [place];  Service: Endoscopy;  Laterality: N/A;    Current Medications: Current Meds  Medication Sig   acetaminophen (TYLENOL) 325 MG tablet Take 2 tablets (650 mg total) by mouth every 6 (six) hours as needed for mild pain (or Fever >/= 101).   ALPRAZolam (XANAX) 0.5 MG tablet Take 0.5 mg by mouth daily as needed for anxiety.    amiodarone (PACERONE) 200 MG tablet Take 1 tablet by mouth twice a day until 7/20 then reduce to 1 tablet daily (Patient taking differently: Take  200 mg by mouth 2 (two) times daily.)   amLODipine (NORVASC) 10 MG tablet Take 1 tablet (10 mg total) by mouth daily.   apixaban (ELIQUIS) 5 MG TABS tablet Take 1 tablet (5 mg total) by mouth 2 (two) times daily.   aspirin 81 MG chewable tablet Chew 81 mg by mouth daily.   atorvastatin (LIPITOR) 80 MG tablet Take 1 tablet (80 mg total) by mouth daily at 6 PM.   calcitRIOL (ROCALTROL) 0.25 MCG capsule Take 1 capsule (0.25 mcg total) by mouth daily.   carvedilol (COREG) 25 MG tablet Take 1 tablet (25 mg total) by mouth 2 (two) times daily with a meal. Appointment Required For Further Refills [phone]   clopidogrel (PLAVIX) 75 MG tablet Take 1 tablet (75 mg total) by mouth daily before  breakfast.   cyanocobalamin 1000 MCG tablet Take 1 tablet (1,000 mcg total) by mouth daily.   ethyl chloride spray Apply topically 3 (three) times a week.   ferrous sulfate 325 (65 FE) MG tablet Take 1 tablet (325 mg total) by mouth 3 (three) times daily with meals.   Methoxy PEG-Epoetin Beta (MIRCERA IJ) Mircera   omeprazole (PRILOSEC) 20 MG capsule Take 20 mg by mouth daily.   sertraline (ZOLOFT) 25 MG tablet Take 25 mg by mouth daily.   VELPHORO 500 MG chewable tablet Chew 1,000 mg by mouth 3 (three) times daily with meals.   [DISCONTINUED] nitroGLYCERIN (NITROSTAT) 0.4 MG SL tablet Place 0.4 mg under the tongue every 5 (five) minutes x 3 doses as needed for chest pain.   [DISCONTINUED] RENAGEL 800 MG tablet Take 1,600-3,200 mg by mouth See admin instructions. 3200  mg with meals 1600 mg with snacks     Allergies:   Chlorhexidine gluconate   Social History   Socioeconomic History   Marital status: Legally Separated    Spouse name: Not on file   Number of children: Not on file   Years of education: Not on file   Highest education level: Not on file  Occupational History   Not on file  Tobacco Use   Smoking status: Never   Smokeless tobacco: Never  Vaping Use   Vaping Use: Never used  Substance and Sexual Activity   Alcohol use: No   Drug use: No   Sexual activity: Not on file  Other Topics Concern   Not on file  Social History Narrative   Lives with daughter   Education- high school   On temp disability   Social Determinants of Health   Financial Resource Strain: Not on file  Food Insecurity: Not on file  Transportation Needs: Not on file  Physical Activity: Not on file  Stress: Not on file  Social Connections: Not on file     Family History: The patient's family history includes Cancer in her father; Heart attack in her maternal grandfather and maternal uncle; Hypertension in her sister; Stroke in her paternal grandmother.  ROS:   Please see the history of  present illness.     All other systems reviewed and are negative.  EKGs/Labs/Other Studies Reviewed:    The following studies were reviewed today:   EKG:  EKG is  ordered today.  The ekg ordered today demonstrates  NSR, TWI laterally no significant change from prior  Recent Labs: 06/18/2020: TSH 1.148 09/20/2020: Magnesium 2.0 09/29/2020: ALT 15; BUN 32; Creatinine, Ser 10.02; Hemoglobin 10.2; Platelets 181; Potassium 4.2; Sodium 137  Recent Lipid Panel    Component Value Date/Time  CHOL 144 04/01/2020 0115   TRIG 72 04/01/2020 0115   HDL 33 (L) 04/01/2020 0115   CHOLHDL 4.4 04/01/2020 0115   VLDL 14 04/01/2020 0115   LDLCALC 97 04/01/2020 0115     Risk Assessment/Calculations:           Physical Exam:    VS:  BP (!) 148/88    Pulse 62    Ht 5\' 8"  (1.727 m)    Wt 215 lb 12.8 oz (97.9 kg)    LMP 08/12/2017    SpO2 95%    BMI 32.81 kg/m     Wt Readings from Last 3 Encounters:  03/22/21 215 lb 12.8 oz (97.9 kg)  10/07/20 232 lb 6.4 oz (105.4 kg)  10/03/20 238 lb (108 kg)     GEN:  Well nourished, well developed in no acute distress HEENT: Normal NECK: No JVD; No carotid bruits LYMPHATICS: No lymphadenopathy CARDIAC: RRR, no murmurs, rubs, gallops RESPIRATORY:  Clear to auscultation without rales, wheezing or rhonchi  ABDOMEN: Soft, non-tender, non-distended MUSCULOSKELETAL:  No edema; No deformity  SKIN: Warm and dry NEUROLOGIC:  Alert and oriented x 3 PSYCHIATRIC:  Normal affect   ASSESSMENT:    #Chest pain/Ischemic Heart Dx: Considering she is s/p ostial/prox LAD 95 % lesion s/p DES Resolute Onyx. On plavix. Considering she is having exertional CP and DOE will opt for LHC. She had a significant calcified lesion in her ostial/prox LAD requiring atherectomy. - cont. coreg 25 mg BID - cont. atorvatstatin 80 mg daily - cont. plavix 75 mg daily - refilled SL nitro PRN  #Paroxysmal Atrial Fibrillation: She was started on amiodarone 200 mg over the summer 2022.  S/p DCCVx2. She's continued on eliquis. She's in sinus rhythm - hold eliquis 48 hours prior to cath  #HTN: In an OK range, on IHD with fluctuations.  - continue norvasc 10 mg daily  #HLD: LDL goal <55 ideally as she is very high risk. - lipid profile  PLAN:    In order of problems listed above:  Lipid profile BMET, CBC LHC Follow up post cath     Shared Decision Making/Informed Consent The risks [stroke (1 in 1000), death (1 in 1000), kidney failure [usually temporary] (1 in 500), bleeding (1 in 200), allergic reaction [possibly serious] (1 in 200)], benefits (diagnostic support and management of coronary artery disease) and alternatives of a cardiac catheterization were discussed in detail with Ms. Gaetz and she is willing to proceed.    Medication Adjustments/Labs and Tests Ordered: Current medicines are reviewed at length with the patient today.  Concerns regarding medicines are outlined above.  Orders Placed This Encounter  Procedures   Basic metabolic panel   CBC   Lipid panel   EKG 12-Lead   Meds ordered this encounter  Medications   nitroGLYCERIN (NITROSTAT) 0.4 MG SL tablet    Sig: Place 1 tablet (0.4 mg total) under the tongue every 5 (five) minutes x 3 doses as needed for chest pain.    Dispense:  90 tablet    Refill:  3    Patient Instructions  Medication Instructions:  PLEASE FOLLOW MEDICATION INSTRUCTIONS FOR LEFT HEART CATH *If you need a refill on your cardiac medications before your next appointment, please call your pharmacy*  Lab Work: LIPID, BMET AND CBC TODAY  If you have labs (blood work) drawn today and your tests are completely normal, you will receive your results only by: [place] (if you have MyChart) OR A paper copy in  the mail If you have any lab test that is abnormal or we need to change your treatment, we will call you to review the results.  Testing/Procedures: Your physician has requested that you have a cardiac  catheterization. Cardiac catheterization is used to diagnose and/or treat various heart conditions. Doctors may recommend this procedure for a number of different reasons. The most common reason is to evaluate chest pain. Chest pain can be a symptom of coronary artery disease (CAD), and cardiac catheterization can show whether plaque is narrowing or blocking your hearts arteries. This procedure is also used to evaluate the valves, as well as measure the blood flow and oxygen levels in different parts of your heart. For further information please visit HugeFiesta.tn. Please follow instruction sheet, as given.  Follow-Up: At [facility], you and your health needs are our priority.  As part of our continuing mission to provide you with exceptional heart care, we have created designated Provider Care Teams.  These Care Teams include your primary Cardiologist (physician) and Advanced Practice Providers (APPs -  Physician Assistants and Nurse Practitioners) who all work together to provide you with the care you need, when you need it.  Your next appointment:   1 month(s)  The format for your next appointment:   In Person  Provider:   Janina Mayo, MD    Other Instructions  [address] [place] [address] Dept: [phone] Loc: [place]  03/22/2021  You are scheduled for a Cardiac Catheterization on Wednesday, December 21 with Dr. Peter Martinique.  1. Please arrive at the [facility] (Main Entrance A) at [facility]: [address] [place], [place] [address] at 6:30 AM (This time is two hours before your procedure to ensure your preparation). Free valet parking service is available.   Special note: Every effort is made to have your procedure done on time. Please understand that emergencies sometimes delay scheduled procedures.  2. Diet: Do not eat solid  foods after midnight.  The patient may have clear liquids until 5am upon the day of the procedure.  3. Labs: You will need to have blood drawn on Wednesday, December 14 at [place], [place]  Open: [address] (Lunch 12:30 - 1:30)   Phone: [phone]. You do not need to be fasting.  4. Medication instructions in preparation for your procedure:   Contrast Allergy: No  Stop taking Eliquis (Apixiban) on Monday, December 19.- DO NOT TAKE ANY ELIQUIS ON Monday 12/19 or Tuesday 12/20  On the morning of your procedure, take your Plavix/Clopidogrel and any morning medicines NOT listed above.  You may use sips of water.  5. Plan for one night stay--bring personal belongings. 6. Bring a current list of your medications and current insurance cards. 7. You MUST have a responsible person to drive you home. 8. Someone MUST be with you the first 24 hours after you arrive home or your discharge will be delayed. 9. Please wear clothes that are easy to get on and off and wear slip-on shoes.  Thank you for allowing [place] to care for you!   -- [place] [facility]     Signed, Janina Mayo, MD  03/22/2021 1:21 PM    [place]

## 2021-03-22 ENCOUNTER — Encounter: Payer: Self-pay | Admitting: Internal Medicine

## 2021-03-22 ENCOUNTER — Other Ambulatory Visit: Payer: Self-pay

## 2021-03-22 ENCOUNTER — Ambulatory Visit (INDEPENDENT_AMBULATORY_CARE_PROVIDER_SITE_OTHER): Payer: Medicare Other | Admitting: Internal Medicine

## 2021-03-22 VITALS — BP 148/88 | HR 62 | Ht 68.0 in | Wt 215.8 lb

## 2021-03-22 DIAGNOSIS — I4891 Unspecified atrial fibrillation: Secondary | ICD-10-CM

## 2021-03-22 DIAGNOSIS — I2510 Atherosclerotic heart disease of native coronary artery without angina pectoris: Secondary | ICD-10-CM

## 2021-03-22 MED ORDER — NITROGLYCERIN 0.4 MG SL SUBL: 0.4000 mg | SUBLINGUAL_TABLET | SUBLINGUAL | 3 refills | Status: AC | PRN

## 2021-03-22 NOTE — Patient Instructions (Signed)
Medication Instructions:  PLEASE FOLLOW MEDICATION INSTRUCTIONS FOR LEFT HEART CATH *If you need a refill on your cardiac medications before your next appointment, please call your pharmacy*  Lab Work: LIPID, BMET AND CBC TODAY  If you have labs (blood work) drawn today and your tests are completely normal, you will receive your results only by: [place] (if you have MyChart) OR A paper copy in the mail If you have any lab test that is abnormal or we need to change your treatment, we will call you to review the results.  Testing/Procedures: Your physician has requested that you have a cardiac catheterization. Cardiac catheterization is used to diagnose and/or treat various heart conditions. Doctors may recommend this procedure for a number of different reasons. The most common reason is to evaluate chest pain. Chest pain can be a symptom of coronary artery disease (CAD), and cardiac catheterization can show whether plaque is narrowing or blocking your hearts arteries. This procedure is also used to evaluate the valves, as well as measure the blood flow and oxygen levels in different parts of your heart. For further information please visit HugeFiesta.tn. Please follow instruction sheet, as given.  Follow-Up: At [facility], you and your health needs are our priority.  As part of our continuing mission to provide you with exceptional heart care, we have created designated Provider Care Teams.  These Care Teams include your primary Cardiologist (physician) and Advanced Practice Providers (APPs -  Physician Assistants and Nurse Practitioners) who all work together to provide you with the care you need, when you need it.  Your next appointment:   1 month(s)  The format for your next appointment:   In Person  Provider:   Janina Mayo, MD    Other Instructions  [address] [place]  [place] [address] Dept: [phone] Loc: [place]  03/22/2021  You are scheduled for a Cardiac Catheterization on Wednesday, December 21 with Dr. Peter Martinique.  1. Please arrive at the [facility] (Main Entrance A) at [facility]: [address] [place], [place] [address] at 6:30 AM (This time is two hours before your procedure to ensure your preparation). Free valet parking service is available.   Special note: Every effort is made to have your procedure done on time. Please understand that emergencies sometimes delay scheduled procedures.  2. Diet: Do not eat solid foods after midnight.  The patient may have clear liquids until 5am upon the day of the procedure.  3. Labs: You will need to have blood drawn on Wednesday, December 14 at [place], [place]  Open: [place] (Lunch 12:30 - 1:30)   Phone: [phone]. You do not need to be fasting.  4. Medication instructions in preparation for your procedure:   Contrast Allergy: No  Stop taking Eliquis (Apixiban) on Monday, December 19.- DO NOT TAKE ANY ELIQUIS ON Monday 12/19 or Tuesday 12/20  On the morning of your procedure, take your Plavix/Clopidogrel and any morning medicines NOT listed above.  You may use sips of water.  5. Plan for one night stay--bring personal belongings. 6. Bring a current list of your medications and current insurance cards. 7. You MUST have a responsible person to drive you home. 8. Someone MUST be with you the first 24 hours after you arrive home or your discharge will be delayed. 9. Please wear clothes that are easy to  get on and off and wear slip-on shoes.  Thank you for allowing [place] to care for you!   -- [place] [facility] services

## 2021-03-23 ENCOUNTER — Encounter (HOSPITAL_COMMUNITY): Payer: Self-pay

## 2021-03-23 DIAGNOSIS — N186 End stage renal disease: Secondary | ICD-10-CM | POA: Diagnosis not present

## 2021-03-23 DIAGNOSIS — I770 Arteriovenous fistula, acquired: Secondary | ICD-10-CM | POA: Diagnosis not present

## 2021-03-23 DIAGNOSIS — D689 Coagulation defect, unspecified: Secondary | ICD-10-CM | POA: Diagnosis not present

## 2021-03-23 DIAGNOSIS — D509 Iron deficiency anemia, unspecified: Secondary | ICD-10-CM | POA: Diagnosis not present

## 2021-03-23 DIAGNOSIS — Z992 Dependence on renal dialysis: Secondary | ICD-10-CM | POA: Diagnosis not present

## 2021-03-23 DIAGNOSIS — D631 Anemia in chronic kidney disease: Secondary | ICD-10-CM | POA: Diagnosis not present

## 2021-03-23 DIAGNOSIS — N2581 Secondary hyperparathyroidism of renal origin: Secondary | ICD-10-CM | POA: Diagnosis not present

## 2021-03-23 LAB — BASIC METABOLIC PANEL
BUN/Creatinine Ratio: 3 — ABNORMAL LOW (ref 9–23)
BUN: 22 mg/dL (ref 6–24)
CO2: 25 mmol/L (ref 20–29)
Calcium: 10.2 mg/dL (ref 8.7–10.2)
Chloride: 95 mmol/L — ABNORMAL LOW (ref 96–106)
Creatinine, Ser: 7.18 mg/dL — ABNORMAL HIGH (ref 0.57–1.00)
Glucose: 84 mg/dL (ref 70–99)
Potassium: 4.9 mmol/L (ref 3.5–5.2)
Sodium: 144 mmol/L (ref 134–144)
eGFR: 6 mL/min/{1.73_m2} — ABNORMAL LOW (ref >59)

## 2021-03-23 LAB — CBC
Hematocrit: 37.4 % (ref 34.0–46.6)
Hemoglobin: 11.8 g/dL (ref 11.1–15.9)
MCH: 29.1 pg (ref 26.6–33.0)
MCHC: 31.6 g/dL (ref 31.5–35.7)
MCV: 92 fL (ref 79–97)
Platelets: 183 10*3/uL (ref 150–450)
RBC: 4.06 x10E6/uL (ref 3.77–5.28)
RDW: 16.2 % — ABNORMAL HIGH (ref 11.7–15.4)
WBC: 5 10*3/uL (ref 3.4–10.8)

## 2021-03-23 LAB — LIPID PANEL
Chol/HDL Ratio: 4.5 ratio — ABNORMAL HIGH (ref 0.0–4.4)
Cholesterol, Total: 221 mg/dL — ABNORMAL HIGH (ref 100–199)
HDL: 49 mg/dL (ref >39)
LDL Chol Calc (NIH): 161 mg/dL — ABNORMAL HIGH (ref 0–99)
Triglycerides: 65 mg/dL (ref 0–149)
VLDL Cholesterol Cal: 11 mg/dL (ref 5–40)

## 2021-03-24 ENCOUNTER — Other Ambulatory Visit (HOSPITAL_COMMUNITY): Payer: Self-pay | Admitting: Physician Assistant

## 2021-03-27 DIAGNOSIS — R0789 Other chest pain: Secondary | ICD-10-CM | POA: Diagnosis not present

## 2021-03-27 DIAGNOSIS — I499 Cardiac arrhythmia, unspecified: Secondary | ICD-10-CM | POA: Diagnosis not present

## 2021-03-27 DIAGNOSIS — R079 Chest pain, unspecified: Secondary | ICD-10-CM | POA: Diagnosis not present

## 2021-03-27 DIAGNOSIS — I469 Cardiac arrest, cause unspecified: Secondary | ICD-10-CM | POA: Diagnosis not present

## 2021-03-28 ENCOUNTER — Telehealth: Payer: Self-pay | Admitting: *Deleted

## 2021-03-28 ENCOUNTER — Encounter (HOSPITAL_COMMUNITY): Payer: Self-pay

## 2021-03-28 NOTE — Telephone Encounter (Signed)
Cardiac catheterization scheduled at [facility] for: Wednesday March 29, 2021 8:30 AM [facility] Main Entrance [facility] at: 6:30 AM   Diet-no solid food after midnight prior to cath, clear liquids until 5 AM day of procedure  Medication instructions for procedure: -Hold:   Eliquis-none 03/21/2021 until post procedure -Except hold medications usual morning medications can be taken pre-cath with sips of water including aspirin 81 mg and Plavix 75 mg.    Confirmed patient has responsible adult to drive home post procedure and be with patient first 24 hours after arriving home.  [facility] does allow one visitor to accompany you and wait in the hospital waiting room while you are there for your procedure. You and your visitor will be asked to wear a mask once you enter the hospital.   Patient reports does not currently have any new symptoms concerning for COVID-19 and no household members with COVID-19 like illness.    Call placed to patient to review procedure instructions, no answer, no voicemail.

## 2021-03-28 NOTE — Telephone Encounter (Signed)
Call placed to patient to review procedure instructions, no answer, no voicemail.

## 2021-03-28 NOTE — Telephone Encounter (Signed)
Call placed to patient to review procedure instructions, call answered by [facility],patient's  sister-in-law. Davy Pique reports patient passed away yesterday around 11:30 AM. Davy Pique states patient called patient's daughter yesterday, told her she wasn't feeling well. Davy Pique reports patient's daughter advised patient to take NTG, called back a short time later, did not get answer at patient's phone number, went to check on patient, patient had passed away. I did confirm patient's DOB with Sonya.  I offered my condolences to [place] and family.  Sonya aware I will forward message to Dr Harl Bowie so she is aware of circumstances. Davy Pique states she has patient's phone or she can be reached at her phone number [phone].

## 2021-03-29 ENCOUNTER — Ambulatory Visit (HOSPITAL_COMMUNITY): Admission: RE | Admit: 2021-03-29 | Payer: Medicare Other | Source: Home / Self Care | Admitting: Cardiology

## 2021-03-29 ENCOUNTER — Encounter (HOSPITAL_COMMUNITY): Admission: RE | Payer: Self-pay | Source: Home / Self Care

## 2021-03-29 SURGERY — LEFT HEART CATH AND CORONARY ANGIOGRAPHY
Anesthesia: LOCAL

## 2021-04-05 ENCOUNTER — Telehealth: Payer: Self-pay | Admitting: Cardiology

## 2021-04-05 NOTE — Telephone Encounter (Signed)
Returned call to funeral home. Per [facility] they need someone here at the office to sign patients death certificate. They state that Dr. Harl Bowie is not in the system to be able to sign and want to know what they should do about this. Advised that Dr. Harl Bowie is out of office, and that I am not sure if anyone else would be able to sign. Will forward to medical records and MD for any help with this issue.

## 2021-04-05 NOTE — Telephone Encounter (Signed)
New Message;     Patient passed  away on 03/17/2021. Dr Phineas Inches is her doctor, but she is not in the Data Base for [facility] to sign the death certificate. She wants to know who can they get from our group to sign in the place of Dr Harl Bowie?

## 2021-04-09 DIAGNOSIS — 419620001 Death: Secondary | SNOMED CT | POA: Diagnosis not present

## 2021-04-09 DEATH — deceased

## 2021-04-26 ENCOUNTER — Ambulatory Visit: Payer: Medicare Other | Admitting: Internal Medicine

## 2021-10-26 IMAGING — NM NM MYOCAR MULTI W/SPECT W/WALL MOTION & EF
6 series · 36 of 36 positions shown · non-contrast
Comparison: None.

CLINICAL DATA: Chest pain

EXAM:
MYOCARDIAL IMAGING WITH SPECT (REST AND PHARMACOLOGIC-STRESS)
GATED LEFT VENTRICULAR WALL MOTION STUDY
LEFT VENTRICULAR EJECTION FRACTION
TECHNIQUE: Standard myocardial SPECT imaging was performed after resting
intravenous injection of 11 mCi Rc-BBm tetrofosmin. Subsequently,
intravenous infusion of Lexiscan was performed under the supervision
of the Cardiology staff. At peak effect of the drug, 33 mCi Rc-BBm
tetrofosmin was injected intravenously and standard myocardial SPECT
imaging was performed. Quantitative gated imaging was also performed
to evaluate left ventricular wall motion, and estimate left
ventricular ejection fraction.

[Series 1: wbr_r-card_st rest · 6.5mm · 6.51mm/px · 6 of 60 frames shown]
[frame 6/60]
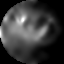
[frame 16/60]
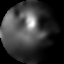
[frame 26/60]
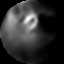
[frame 36/60]
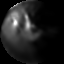
[frame 46/60]
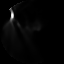
[frame 56/60]
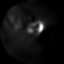

[Series 1: rest · 6.51mm/px · 6 of 64 frames shown]
[frame 6/64]
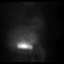
[frame 16/64]
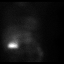
[frame 27/64]
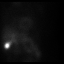
[frame 38/64]
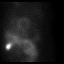
[frame 48/64]
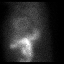
[frame 59/64]
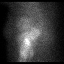

[Series 2: wbr_s-card_st stress gated - gated · 6.5mm · 6.51mm/px · 6 of 480 frames shown]
[frame 41/480]
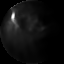
[frame 121/480]
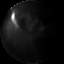
[frame 201/480]
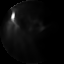
[frame 281/480]
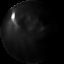
[frame 361/480]
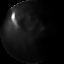
[frame 441/480]
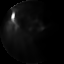

[Series 2: stress gated - gated · 6.51mm/px · 6 of 512 frames shown]
[frame 43/512]
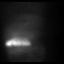
[frame 128/512]
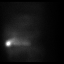
[frame 214/512]
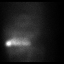
[frame 299/512]
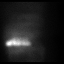
[frame 384/512]
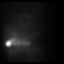
[frame 470/512]
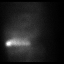

[Series 3: wbr_s-card_st stress gated - perfusion · 6.5mm · 6.51mm/px · 6 of 60 frames shown]
[frame 6/60]
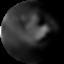
[frame 16/60]
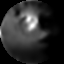
[frame 26/60]
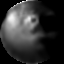
[frame 36/60]
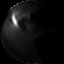
[frame 46/60]
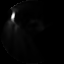
[frame 56/60]
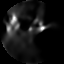

[Series 3: stress gated - perfusion · 6.51mm/px · 6 of 64 frames shown]
[frame 6/64]
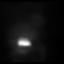
[frame 16/64]
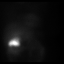
[frame 27/64]
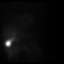
[frame 38/64]
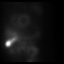
[frame 48/64]
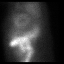
[frame 59/64]
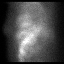

[36 of 36 positions shown; findings below may reference images not displayed]

FINDINGS: Perfusion: There is a fixed defect in the inferior wall extending
from mid wall the apex. There is a mildly reversible defect
accounting for approximately 10% of left ventricular wall affecting
the anterior septal wall. No other perfusion abnormalities.

Wall Motion: Global hypokinesis. No wall thickening in the region of
the inferior wall fixed defect.

Left Ventricular Ejection Fraction: 44 %

End diastolic volume 163 ml

End systolic volume 91 ml
IMPRESSION: 1. There is an infarct in the inferior wall extending from mid wall
to apex. There is a moderate size reversible defect in the anterior
septal wall towards the apex.

2. Global hypokinesis. No wall thickening in the region of infarct.
Severely abnormal end-diastolic volume and end systolic volume.

3. Left ventricular ejection fraction 44%

4. Non invasive risk stratification*: High

*2702 Appropriate Use Criteria for Coronary Revascularization
Focused Update: J Am Coll Cardiol. 2702;59(9):857-881.
[URL]

These results will be called to the ordering clinician or
representative by the Radiologist Assistant, and communication
documented in the PACS or [REDACTED].

## 2022-01-13 IMAGING — DX DG CHEST 1V PORT
1 series · 1 of 1 positions shown · non-contrast
Comparison: 06/17/2020

CLINICAL DATA: Short of breath

EXAM:
PORTABLE CHEST 1 VIEW

[chest]
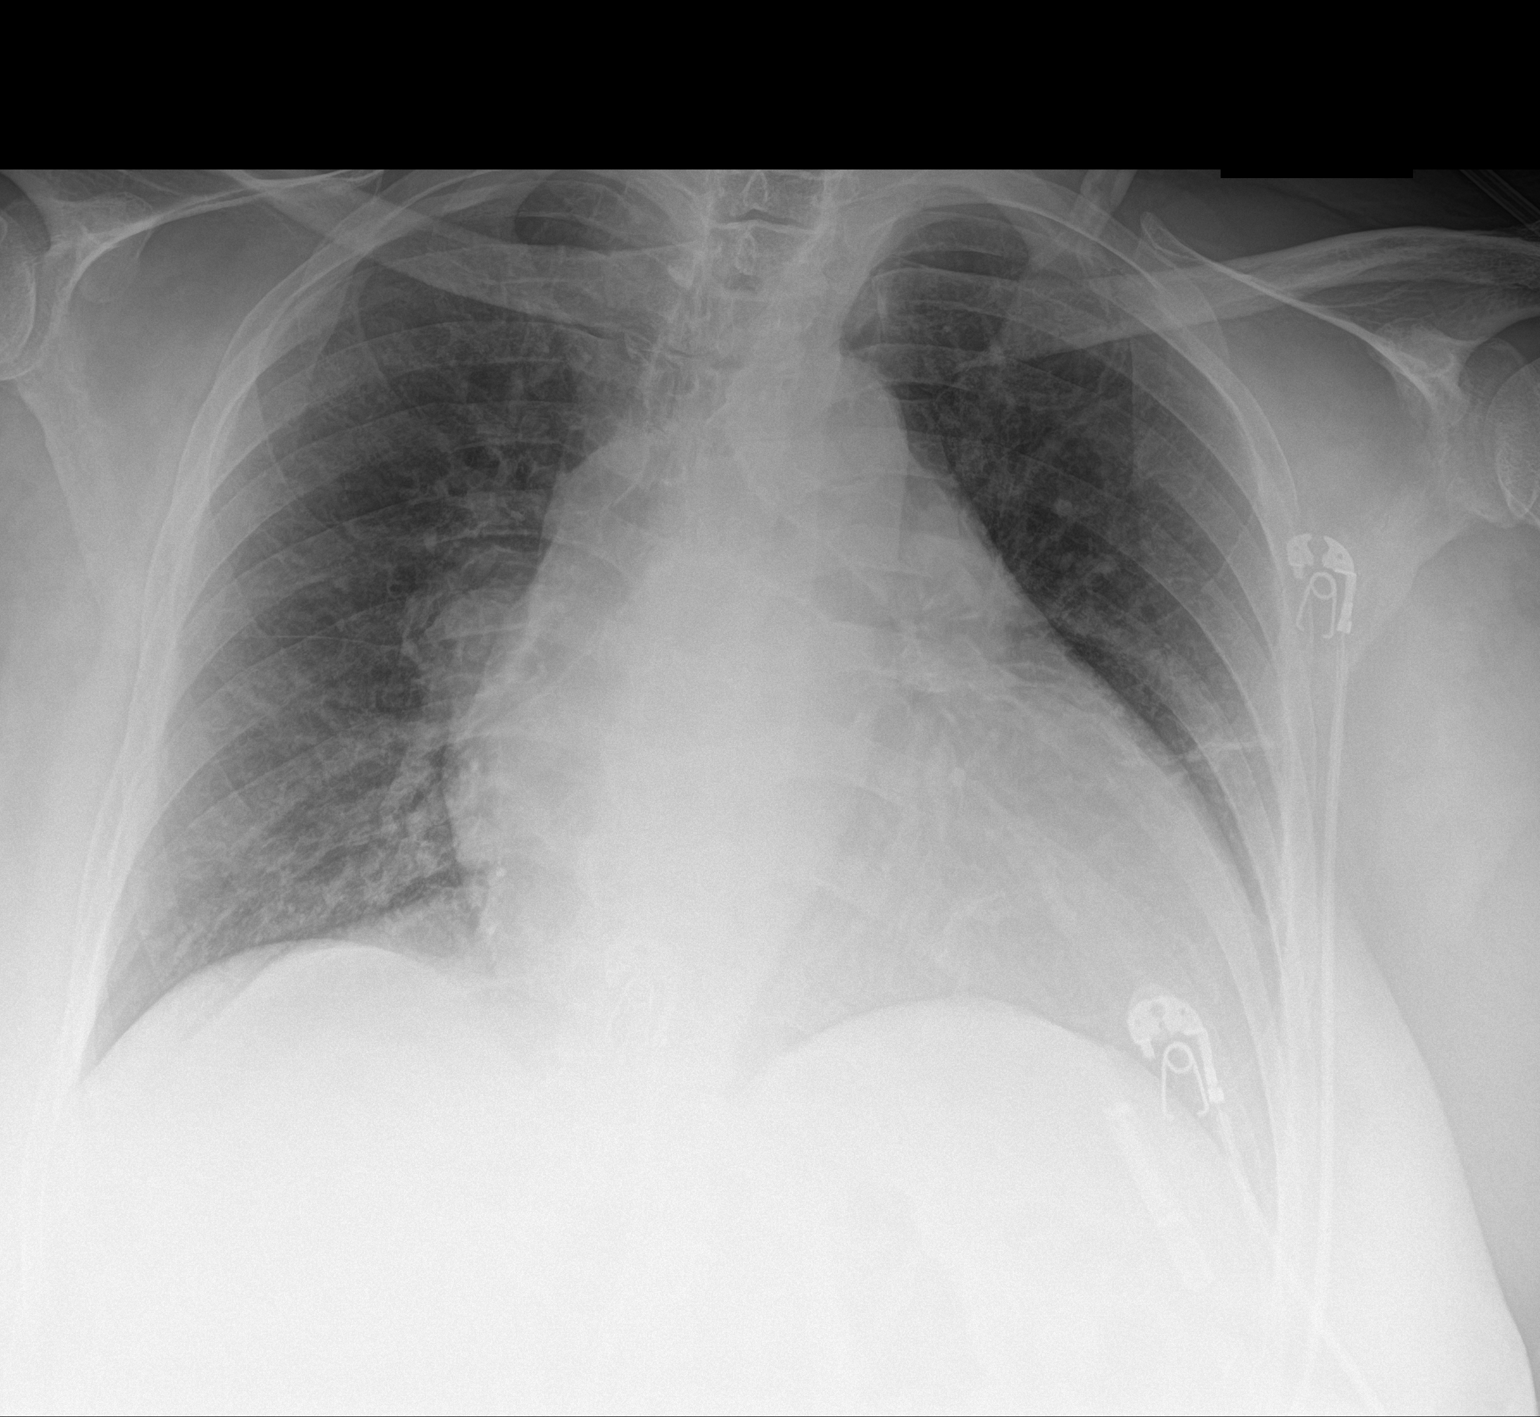

[1 of 1 positions shown; findings below may reference images not displayed]

FINDINGS: Stable enlarged cardiac silhouette. Normal mediastinum. Normal
pulmonary vasculature. No evidence of effusion, infiltrate, or
pneumothorax. No acute bony abnormality.
IMPRESSION: No acute cardiopulmonary process.  Cardiomegaly.
